# Patient Record
Sex: Female | Born: 1950 | Race: White | Hispanic: No | Marital: Married | Smoking: Never smoker
Health system: Southern US, Community
[De-identification: ages and names within clinical notes are randomized; demographics above are authoritative.]

## PROBLEM LIST (undated history)

## (undated) DIAGNOSIS — I10 Essential (primary) hypertension: Secondary | ICD-10-CM

## (undated) DIAGNOSIS — Z87442 Personal history of urinary calculi: Secondary | ICD-10-CM

## (undated) HISTORY — PX: CHOLECYSTECTOMY: SHX55

## (undated) HISTORY — PX: TUBAL LIGATION: SHX77

---

## 1997-08-31 ENCOUNTER — Ambulatory Visit (HOSPITAL_COMMUNITY): Admission: RE | Admit: 1997-08-31 | Discharge: 1997-08-31 | Payer: Self-pay | Admitting: Obstetrics & Gynecology

## 1998-03-12 ENCOUNTER — Other Ambulatory Visit: Admission: RE | Admit: 1998-03-12 | Discharge: 1998-03-12 | Payer: Self-pay | Admitting: Obstetrics & Gynecology

## 1998-09-03 ENCOUNTER — Encounter: Payer: Self-pay | Admitting: Obstetrics & Gynecology

## 1998-09-03 ENCOUNTER — Ambulatory Visit (HOSPITAL_COMMUNITY): Admission: RE | Admit: 1998-09-03 | Discharge: 1998-09-03 | Payer: Self-pay | Admitting: Obstetrics & Gynecology

## 1999-04-05 ENCOUNTER — Other Ambulatory Visit: Admission: RE | Admit: 1999-04-05 | Discharge: 1999-04-05 | Payer: Self-pay | Admitting: Obstetrics & Gynecology

## 1999-08-04 ENCOUNTER — Ambulatory Visit (HOSPITAL_COMMUNITY): Admission: RE | Admit: 1999-08-04 | Discharge: 1999-08-04 | Payer: Self-pay | Admitting: Obstetrics & Gynecology

## 1999-08-04 ENCOUNTER — Encounter: Payer: Self-pay | Admitting: Obstetrics & Gynecology

## 2000-07-27 ENCOUNTER — Ambulatory Visit (HOSPITAL_COMMUNITY): Admission: RE | Admit: 2000-07-27 | Discharge: 2000-07-27 | Payer: Self-pay | Admitting: Gastroenterology

## 2000-08-03 ENCOUNTER — Ambulatory Visit (HOSPITAL_COMMUNITY): Admission: RE | Admit: 2000-08-03 | Discharge: 2000-08-03 | Payer: Self-pay | Admitting: Obstetrics and Gynecology

## 2000-08-03 ENCOUNTER — Encounter: Payer: Self-pay | Admitting: Obstetrics and Gynecology

## 2001-08-13 ENCOUNTER — Ambulatory Visit (HOSPITAL_COMMUNITY): Admission: RE | Admit: 2001-08-13 | Discharge: 2001-08-13 | Payer: Self-pay | Admitting: Obstetrics and Gynecology

## 2001-08-13 ENCOUNTER — Encounter: Payer: Self-pay | Admitting: Obstetrics and Gynecology

## 2002-08-18 ENCOUNTER — Encounter: Payer: Self-pay | Admitting: Obstetrics and Gynecology

## 2002-08-18 ENCOUNTER — Ambulatory Visit (HOSPITAL_COMMUNITY): Admission: RE | Admit: 2002-08-18 | Discharge: 2002-08-18 | Payer: Self-pay | Admitting: Obstetrics and Gynecology

## 2003-06-01 ENCOUNTER — Emergency Department (HOSPITAL_COMMUNITY): Admission: EM | Admit: 2003-06-01 | Discharge: 2003-06-01 | Payer: Self-pay | Admitting: Emergency Medicine

## 2003-06-02 ENCOUNTER — Ambulatory Visit (HOSPITAL_COMMUNITY): Admission: RE | Admit: 2003-06-02 | Discharge: 2003-06-02 | Payer: Self-pay | Admitting: Emergency Medicine

## 2003-06-03 ENCOUNTER — Ambulatory Visit (HOSPITAL_COMMUNITY): Admission: RE | Admit: 2003-06-03 | Discharge: 2003-06-03 | Payer: Self-pay | Admitting: General Surgery

## 2003-08-19 ENCOUNTER — Ambulatory Visit (HOSPITAL_COMMUNITY): Admission: RE | Admit: 2003-08-19 | Discharge: 2003-08-19 | Payer: Self-pay | Admitting: Obstetrics and Gynecology

## 2004-04-06 ENCOUNTER — Other Ambulatory Visit: Admission: RE | Admit: 2004-04-06 | Discharge: 2004-04-06 | Payer: Self-pay | Admitting: Obstetrics and Gynecology

## 2004-08-22 ENCOUNTER — Ambulatory Visit (HOSPITAL_COMMUNITY): Admission: RE | Admit: 2004-08-22 | Discharge: 2004-08-22 | Payer: Self-pay | Admitting: Obstetrics and Gynecology

## 2005-07-25 ENCOUNTER — Other Ambulatory Visit: Admission: RE | Admit: 2005-07-25 | Discharge: 2005-07-25 | Payer: Self-pay | Admitting: Obstetrics and Gynecology

## 2005-08-24 ENCOUNTER — Ambulatory Visit (HOSPITAL_COMMUNITY): Admission: RE | Admit: 2005-08-24 | Discharge: 2005-08-24 | Payer: Self-pay | Admitting: Obstetrics and Gynecology

## 2006-08-06 ENCOUNTER — Other Ambulatory Visit: Admission: RE | Admit: 2006-08-06 | Discharge: 2006-08-06 | Payer: Self-pay | Admitting: Obstetrics and Gynecology

## 2006-08-27 ENCOUNTER — Ambulatory Visit (HOSPITAL_COMMUNITY): Admission: RE | Admit: 2006-08-27 | Discharge: 2006-08-27 | Payer: Self-pay | Admitting: Obstetrics and Gynecology

## 2007-08-07 ENCOUNTER — Other Ambulatory Visit: Admission: RE | Admit: 2007-08-07 | Discharge: 2007-08-07 | Payer: Self-pay | Admitting: Obstetrics and Gynecology

## 2007-08-28 ENCOUNTER — Ambulatory Visit (HOSPITAL_COMMUNITY): Admission: RE | Admit: 2007-08-28 | Discharge: 2007-08-28 | Payer: Self-pay | Admitting: Obstetrics and Gynecology

## 2008-08-17 ENCOUNTER — Other Ambulatory Visit: Admission: RE | Admit: 2008-08-17 | Discharge: 2008-08-17 | Payer: Self-pay | Admitting: Obstetrics and Gynecology

## 2008-08-28 ENCOUNTER — Ambulatory Visit (HOSPITAL_COMMUNITY): Admission: RE | Admit: 2008-08-28 | Discharge: 2008-08-28 | Payer: Self-pay | Admitting: Obstetrics and Gynecology

## 2008-09-02 ENCOUNTER — Encounter: Admission: RE | Admit: 2008-09-02 | Discharge: 2008-09-02 | Payer: Self-pay | Admitting: Obstetrics and Gynecology

## 2009-08-30 ENCOUNTER — Encounter: Admission: RE | Admit: 2009-08-30 | Discharge: 2009-08-30 | Payer: Self-pay | Admitting: Obstetrics and Gynecology

## 2009-09-02 ENCOUNTER — Other Ambulatory Visit: Admission: RE | Admit: 2009-09-02 | Discharge: 2009-09-02 | Payer: Self-pay | Admitting: Obstetrics and Gynecology

## 2010-06-24 NOTE — H&P (Signed)
NAME:  Ebony Williams, Ebony Williams                         ACCOUNT NO.:  [id number]   MEDICAL RECORD NO.:  [id number]                   PATIENT TYPE:  OUT   LOCATION:                                       FACILITY:  [facility]   PHYSICIAN:  Dalia Heading, M.D.               DATE OF BIRTH:  [birth year]   DATE OF ADMISSION:  06/03/2003  DATE OF DISCHARGE:                                HISTORY & PHYSICAL   CHIEF COMPLAINT:  Cholecystitis, cholelithiasis.   HISTORY OF PRESENT ILLNESS:  The patient is a 60 year old white female who  is referred for evaluation and treatment of cholecystitis secondary to  cholelithiasis.  She has been having intermittent episode of right upper  quadrant abdominal pain with radiation to the right flank, nausea, and  bloating for several days.  Some fatty food intolerance is noted.  No fever,  chills, or jaundice have been noted.   PAST MEDICAL HISTORY:  Past medical history includes:  1. Hypertension.  2. Kidney stones.   PAST SURGICAL HISTORY:  Noncontributory, tubal ligation.   CURRENT MEDICATIONS:  Effexor, Diovan.   ALLERGIES:  SULFA.   REVIEW OF SYSTEMS:  The patient denies drinking or smoking.  She denies any  cardiopulmonary difficulties or bleeding disorders.   PHYSICAL EXAMINATION:  GENERAL:  On physical examination, the patient is a  well-developed, well-nourished white female in no acute distress.  VITAL SIGNS:  She is afebrile and vital signs are stable.  HEENT:  Examination reveals no scleral icterus.  LUNGS:  Lungs are clear to auscultation with equal breath sounds  bilaterally.  HEART:  Examination reveals a regular rate and rhythm without S3, S4, or  murmurs.  ABDOMEN:  The abdomen is soft with slight tenderness noted in the right  upper quadrant to palpation.  No hepatosplenomegaly, masses, or herniae are  identified.   IMAGING STUDY:  Ultrasound of the gallbladder revealed cholelithiasis with a  normal common bile duct.   IMPRESSION:  1.  Cholecystitis.  2. Cholelithiasis.   PLAN:  The patient is scheduled for a laparoscopic cholecystectomy on June 03, 2003.  The risks and benefits of the procedure including bleeding,  infection, hepatobiliary injury, and the possibility of an open procedure  were fully explained to the patient, who gave informed consent.     ___________________________________________                                         Dalia Heading, M.D.   MAJ/MEDQ  D:  06/02/2003  T:  06/02/2003  Job:  [id number]   cc:   [facility]

## 2010-06-24 NOTE — Op Note (Signed)
NAME:  Ebony Williams, Ebony Williams                         ACCOUNT NO.:  [id number]   MEDICAL RECORD NO.:  [id number]                   PATIENT TYPE:  AMB   LOCATION:  DAY                                  FACILITY:  [facility]   PHYSICIAN:  Dalia Heading, M.D.               DATE OF BIRTH:  [birth year]   DATE OF PROCEDURE:  06/03/2003  DATE OF DISCHARGE:                                 OPERATIVE REPORT   PREOPERATIVE DIAGNOSIS:  Cholecystitis, cholelithiasis.   POSTOPERATIVE DIAGNOSIS:  Cholecystitis, cholelithiasis.   OPERATION/PROCEDURE:  Laparoscopic cholecystectomy.   SURGEON:  Dalia Heading, M.D.   ASSISTANT:  Bernerd Limbo. Leona Carry, M.D.   ANESTHESIA:  General endotracheal anesthesia.   INDICATIONS:  The patient is a 60 year old white female who presents with  cholecystitis secondary to cholelithiasis.  This was confirmed by ultrasound  of the gallbladder.  The risks and benefits of the procedure including  bleeding, infection, hepatobiliary injury, and the possibility of an open  procedure were fully explained to the patient, who gave informed consent.   DESCRIPTION OF PROCEDURE:  The patient is placed in the supine position.  After induction of general endotracheal anesthesia, the abdomen was prepped  and draped using the usual sterile technique with Betadine.  Surgical site  confirmation was performed.   A supraumbilical incision was made down to the fascia.  Veress needle was  introduced into the abdominal cavity and confirmation of placement was done  using the saline drop test.  The abdomen was then insufflated with 16 mmHg  pressure.  An 11 mm trocar was then introduced into the abdominal cavity  under direct visualization without difficulty.  The patient was placed in  reverse Trendelenburg position and additional 11 mm trocar was placed in the  epigastric region and 5 mm trocars were placed in the right upper quadrant  and right flank regions.  Liver was inspected and noted to be  within normal  limits.  The gallbladder was retracted superiorly and laterally.  The  dissection was begun around the infundibulum of the gallbladder.  The cystic  duct was first identified.  Its junction to the infundibulum was fully  identified.  Endoclips were placed proximally and distally on the cystic  duct and the cystic duct was divided.  This was likewise done with the  cystic artery.  The gallbladder was then freed away from the gallbladder  fossa using Bovie electrocautery.  The gallbladder was delivered through the  epigastric trocar site using an endocatch bag.  The gallbladder fossa was  inspected and no abnormal bleeding or bile leakage was noted.  Surgicel was  placed in the gallbladder fossa.  All fluid was then evacuated from the  abdominal cavity prior to removal of the trocars.  All wounds were irrigated  with normal saline.  All wounds were injected with 0.5% Sensorcaine.  The  supraumbilical fascia was reapproximated using an 0 Vicryl  interrupted  suture.  All skin incisions were closed using staples.  Betadine ointment  and dry sterile dressings were applied.   All tape and needle counts correct at the end of the procedure.  The patient  was extubated in the operating room and went back to the recovery room  awake, in stable condition.  Complications - none.  Specimen - gallbladder  with stones.  Blood loss minimal.      ___________________________________________                                            Dalia Heading, M.D.   MAJ/MEDQ  D:  06/03/2003  T:  06/03/2003  Job:  [id number]   cc:   [facility]

## 2010-07-25 ENCOUNTER — Other Ambulatory Visit: Payer: Self-pay | Admitting: Obstetrics and Gynecology

## 2010-07-25 DIAGNOSIS — Z1231 Encounter for screening mammogram for malignant neoplasm of breast: Secondary | ICD-10-CM

## 2010-08-24 ENCOUNTER — Other Ambulatory Visit: Payer: Self-pay | Admitting: Gastroenterology

## 2010-08-24 DIAGNOSIS — Z86010 Personal history of colonic polyps: Secondary | ICD-10-CM

## 2010-08-26 ENCOUNTER — Ambulatory Visit
Admission: RE | Admit: 2010-08-26 | Discharge: 2010-08-26 | Disposition: A | Discharge status: Home / Self Care | Payer: BC Managed Care – PPO | Source: Ambulatory Visit | Attending: Gastroenterology | Admitting: Gastroenterology

## 2010-08-26 DIAGNOSIS — Z86010 Personal history of colonic polyps: Secondary | ICD-10-CM

## 2010-09-01 ENCOUNTER — Ambulatory Visit
Admission: RE | Admit: 2010-09-01 | Discharge: 2010-09-01 | Disposition: A | Discharge status: Home / Self Care | Payer: BC Managed Care – PPO | Source: Ambulatory Visit | Attending: Obstetrics and Gynecology | Admitting: Obstetrics and Gynecology

## 2010-09-01 DIAGNOSIS — Z1231 Encounter for screening mammogram for malignant neoplasm of breast: Secondary | ICD-10-CM

## 2010-09-06 ENCOUNTER — Other Ambulatory Visit: Payer: Self-pay | Admitting: Nurse Practitioner

## 2010-09-06 ENCOUNTER — Other Ambulatory Visit (HOSPITAL_COMMUNITY)
Admission: RE | Admit: 2010-09-06 | Discharge: 2010-09-06 | Disposition: A | Discharge status: Home / Self Care | Payer: BC Managed Care – PPO | Source: Ambulatory Visit | Attending: Obstetrics and Gynecology | Admitting: Obstetrics and Gynecology

## 2010-09-06 DIAGNOSIS — Z01419 Encounter for gynecological examination (general) (routine) without abnormal findings: Secondary | ICD-10-CM | POA: Insufficient documentation

## 2011-07-24 ENCOUNTER — Other Ambulatory Visit: Payer: Self-pay | Admitting: Obstetrics and Gynecology

## 2011-07-24 DIAGNOSIS — Z1231 Encounter for screening mammogram for malignant neoplasm of breast: Secondary | ICD-10-CM

## 2011-09-11 ENCOUNTER — Ambulatory Visit
Admission: RE | Admit: 2011-09-11 | Discharge: 2011-09-11 | Disposition: A | Discharge status: Home / Self Care | Payer: BC Managed Care – PPO | Source: Ambulatory Visit | Attending: Obstetrics and Gynecology | Admitting: Obstetrics and Gynecology

## 2011-09-11 DIAGNOSIS — Z1231 Encounter for screening mammogram for malignant neoplasm of breast: Secondary | ICD-10-CM

## 2011-09-13 ENCOUNTER — Other Ambulatory Visit: Payer: Self-pay | Admitting: Obstetrics and Gynecology

## 2011-10-18 ENCOUNTER — Ambulatory Visit (HOSPITAL_COMMUNITY)
Admission: RE | Admit: 2011-10-18 | Discharge: 2011-10-18 | Disposition: A | Discharge status: Home / Self Care | Payer: BC Managed Care – PPO | Source: Ambulatory Visit | Attending: Orthopedic Surgery | Admitting: Orthopedic Surgery

## 2011-10-18 DIAGNOSIS — IMO0001 Reserved for inherently not codable concepts without codable children: Secondary | ICD-10-CM | POA: Insufficient documentation

## 2011-10-18 DIAGNOSIS — M25619 Stiffness of unspecified shoulder, not elsewhere classified: Secondary | ICD-10-CM | POA: Insufficient documentation

## 2011-10-18 DIAGNOSIS — M6281 Muscle weakness (generalized): Secondary | ICD-10-CM | POA: Insufficient documentation

## 2011-10-18 DIAGNOSIS — M25519 Pain in unspecified shoulder: Secondary | ICD-10-CM | POA: Insufficient documentation

## 2011-10-18 DIAGNOSIS — M7500 Adhesive capsulitis of unspecified shoulder: Secondary | ICD-10-CM | POA: Insufficient documentation

## 2011-10-18 NOTE — Evaluation (Addendum)
Occupational Therapy Evaluation  Patient Details  Name: Ebony Williams MRN: [id number] Date of Birth: February 23, 1950  Today's Date: 10/18/2011 Time: 9147-8295 OT Time Calculation (min): 40 min OT Evaluation 20' Manual Therapy 20' Visit#: 1  of 1   Re-eval:    Assessment Diagnosis: Right Frozen Shoulder  Authorization: no authorization required   Past Medical History: No past medical history on file. Past Surgical History: No past surgical history on file.  Subjective S:When I reach into the back of the dryer it's like a knife stabbing me. Pertinent History: [name] reports begininning to experience pain in her right shoulder this summer after using a leaf blower.  She consulted with Dr. Ranell Patrick and was diagnosed with a frozen shoulder.  She recieved a cortisone injection 2 weeks ago that has alleviated her pain.  She has been referred to occupational therapy for evaluation and education on a HEP. Limitations: doffing bra, reaching into the back of the dryer, reaching overhead. Special Tests: UEFI:  78/80= 98% Patient Stated Goals: To be able to hold her new grand baby when it's born. Pain Assessment Currently in Pain?: No/denies Pain Radiating Towards: pain with attempting to fasten her bra or reach into dryer is "excruciating"  Precautions/Restrictions  Precautions Precautions: None  Prior Functioning  Home Living Lives With: Spouse;Other (Comment) (36 y/o grandson) Prior Function Vocation: Full time employment Vocation Requirements:  Doctor, general practice)  Assessment ADL/Vision/Perception ADL ADL Comments: Patient has difficulty fastening her bra and reaching into the back of the dryer Dominant Hand: Right Vision - History Baseline Vision: Wears glasses all the time  Cognition/Observation Cognition Orientation Level: Oriented X4   Additional Assessments RUE AROM (degrees) RUE Overall AROM Comments: ER/IR with shoulder abducted Right Shoulder Flexion: 155  Degrees (seated) Right Shoulder ABduction: 115 Degrees (standing) Right Shoulder Internal Rotation: 68 Degrees (standing) Right Shoulder External Rotation: 55 Degrees (standing) RUE PROM (degrees) RUE Overall PROM Comments: WFL RUE Strength RUE Overall Strength Comments: 5/5 Palpation Palpation: Max fascial restrictions in her scapular, trapezius, and upper arm      Exercise/Treatments    Manual Therapy Manual Therapy: Myofascial release Myofascial Release: MFR to right upper arm, scapular, trapezius region to decrease pain and restrictions and increase AROM and strength.  Occupational Therapy Assessment and Plan OT Assessment and Plan Clinical Impression Statement: A:  61 year old female with pain and restrictions  causing decreased AROM in right shoulder due to frozen shoulder.  Referred for HEP. Pt will benefit from skilled therapeutic intervention in order to improve on the following deficits: Decreased range of motion;Increased muscle spasms;Increased fascial restricitons;Pain Rehab Potential: Excellent OT Frequency: Min 1X/week OT Duration: Other (comment) (1 weeks) OT Treatment/Interventions: Manual therapy;Patient/family education OT Plan: P:  Educated on HEP today and will continue her therapy as a HEP.     Goals Short Term Goals Time to Complete Short Term Goals: 2 weeks Short Term Goal 1: Patient will be educated on HEP for shoulder stretches and proximal shoulder strengthening.  Short Term Goal 1 Progress: Met  Problem List Patient Active Problem List  Diagnosis  . Frozen shoulder  . Pain in joint, shoulder region    End of Session Activity Tolerance: Patient tolerated treatment well General Behavior During Session: [facility] for tasks performed Cognition: [facility] for tasks performed OT Plan of Care OT Home Exercise Plan: Educated on HEP for shoulder stretches, tband, and cervical stretches.   Consulted and Agree with Plan of Care: Patient  GO    Hyun Reali H.  Dayton Scrape, OTR/L  10/18/2011, 4:11 PM  Physician Documentation Your signature is required to indicate approval of the treatment plan as stated above.  Please sign and either send electronically or make a copy of this report for your files and return this physician signed original.  Please mark one 1.__approve of plan  2. ___approve of plan with the following conditions.   ______________________________                                                          _____________________ Physician Signature                                                                                                             Date

## 2012-08-07 ENCOUNTER — Other Ambulatory Visit: Payer: Self-pay

## 2012-08-07 DIAGNOSIS — Z1231 Encounter for screening mammogram for malignant neoplasm of breast: Secondary | ICD-10-CM

## 2012-09-11 ENCOUNTER — Ambulatory Visit
Admission: RE | Admit: 2012-09-11 | Discharge: 2012-09-11 | Disposition: A | Discharge status: Home / Self Care | Payer: BC Managed Care – PPO | Source: Ambulatory Visit

## 2012-09-11 DIAGNOSIS — Z1231 Encounter for screening mammogram for malignant neoplasm of breast: Secondary | ICD-10-CM

## 2012-09-16 ENCOUNTER — Other Ambulatory Visit: Payer: Self-pay | Admitting: Obstetrics and Gynecology

## 2013-08-04 ENCOUNTER — Other Ambulatory Visit: Payer: Self-pay

## 2013-08-04 DIAGNOSIS — Z1231 Encounter for screening mammogram for malignant neoplasm of breast: Secondary | ICD-10-CM

## 2013-09-12 ENCOUNTER — Ambulatory Visit
Admission: RE | Admit: 2013-09-12 | Discharge: 2013-09-12 | Disposition: A | Discharge status: Home / Self Care | Payer: Medicare HMO | Source: Ambulatory Visit

## 2013-09-12 ENCOUNTER — Encounter (INDEPENDENT_AMBULATORY_CARE_PROVIDER_SITE_OTHER): Payer: Self-pay

## 2013-09-12 DIAGNOSIS — Z1231 Encounter for screening mammogram for malignant neoplasm of breast: Secondary | ICD-10-CM

## 2013-09-25 ENCOUNTER — Other Ambulatory Visit: Payer: Self-pay | Admitting: Obstetrics and Gynecology

## 2013-09-26 LAB — CYTOLOGY - PAP

## 2014-05-19 ENCOUNTER — Other Ambulatory Visit (HOSPITAL_COMMUNITY): Payer: Self-pay | Admitting: Family Medicine

## 2014-05-19 DIAGNOSIS — M8580 Other specified disorders of bone density and structure, unspecified site: Secondary | ICD-10-CM

## 2014-05-26 ENCOUNTER — Ambulatory Visit (HOSPITAL_COMMUNITY)
Admission: RE | Admit: 2014-05-26 | Discharge: 2014-05-26 | Disposition: A | Discharge status: Home / Self Care | Payer: Medicare PPO | Source: Ambulatory Visit | Attending: Family Medicine | Admitting: Family Medicine

## 2014-05-26 DIAGNOSIS — M8580 Other specified disorders of bone density and structure, unspecified site: Secondary | ICD-10-CM

## 2014-05-26 DIAGNOSIS — M859 Disorder of bone density and structure, unspecified: Secondary | ICD-10-CM | POA: Diagnosis not present

## 2014-08-06 ENCOUNTER — Other Ambulatory Visit: Payer: Self-pay

## 2014-08-06 DIAGNOSIS — Z1231 Encounter for screening mammogram for malignant neoplasm of breast: Secondary | ICD-10-CM

## 2014-09-18 ENCOUNTER — Ambulatory Visit
Admission: RE | Admit: 2014-09-18 | Discharge: 2014-09-18 | Disposition: A | Discharge status: Home / Self Care | Payer: Medicare PPO | Source: Ambulatory Visit

## 2014-09-18 DIAGNOSIS — Z1231 Encounter for screening mammogram for malignant neoplasm of breast: Secondary | ICD-10-CM

## 2014-09-21 ENCOUNTER — Ambulatory Visit: Payer: Medicare PPO

## 2014-09-23 ENCOUNTER — Other Ambulatory Visit: Payer: Self-pay | Admitting: Obstetrics and Gynecology

## 2014-09-23 DIAGNOSIS — R928 Other abnormal and inconclusive findings on diagnostic imaging of breast: Secondary | ICD-10-CM

## 2014-09-25 ENCOUNTER — Ambulatory Visit
Admission: RE | Admit: 2014-09-25 | Discharge: 2014-09-25 | Disposition: A | Discharge status: Home / Self Care | Payer: Medicare PPO | Source: Ambulatory Visit | Attending: Obstetrics and Gynecology | Admitting: Obstetrics and Gynecology

## 2014-09-25 DIAGNOSIS — R928 Other abnormal and inconclusive findings on diagnostic imaging of breast: Secondary | ICD-10-CM

## 2015-01-04 ENCOUNTER — Encounter (HOSPITAL_COMMUNITY): Payer: Self-pay | Admitting: *Deleted

## 2015-01-04 ENCOUNTER — Other Ambulatory Visit: Payer: Self-pay | Admitting: Gastroenterology

## 2015-01-07 NOTE — Anesthesia Preprocedure Evaluation (Addendum)
Anesthesia Evaluation  Patient identified by MRN, date of birth, ID band Patient awake    Reviewed: Allergy & Precautions, NPO status , Patient's Chart, lab work & pertinent test results  Airway Mallampati: II   Neck ROM: Full    Dental  (+) Dental Advisory Given, Teeth Intact   Pulmonary neg pulmonary ROS,    breath sounds clear to auscultation       Cardiovascular hypertension, negative cardio ROS   Rhythm:Regular     Neuro/Psych Anxiety negative neurological ROS  negative psych ROS   GI/Hepatic Neg liver ROS, Abnormal stool test   Endo/Other  negative endocrine ROS  Renal/GU negative Renal ROS  negative genitourinary   Musculoskeletal negative musculoskeletal ROS (+)   Abdominal (+)  Abdomen: soft.    Peds negative pediatric ROS (+)  Hematology negative hematology ROS (+)   Anesthesia Other Findings   Reproductive/Obstetrics negative OB ROS                            Anesthesia Physical Anesthesia Plan  ASA: II  Anesthesia Plan: MAC   Post-op Pain Management:    Induction:   Airway Management Planned: Nasal Cannula  Additional Equipment:   Intra-op Plan:   Post-operative Plan:   Informed Consent: I have reviewed the patients History and Physical, chart, labs and discussed the procedure including the risks, benefits and alternatives for the proposed anesthesia with the patient or authorized representative who has indicated his/her understanding and acceptance.     Plan Discussed with:   Anesthesia Plan Comments:         Anesthesia Quick Evaluation

## 2015-01-12 ENCOUNTER — Encounter (HOSPITAL_COMMUNITY): Payer: Self-pay | Admitting: *Deleted

## 2015-01-12 ENCOUNTER — Ambulatory Visit (HOSPITAL_COMMUNITY)
Admission: RE | Admit: 2015-01-12 | Discharge: 2015-01-12 | Disposition: A | Discharge status: Home / Self Care | Payer: Medicare PPO | Source: Ambulatory Visit | Attending: Gastroenterology | Admitting: Gastroenterology

## 2015-01-12 ENCOUNTER — Encounter (HOSPITAL_COMMUNITY)
Admission: RE | Disposition: A | Discharge status: Home / Self Care | Payer: Self-pay | Source: Ambulatory Visit | Attending: Gastroenterology

## 2015-01-12 ENCOUNTER — Ambulatory Visit (HOSPITAL_COMMUNITY): Payer: Medicare PPO | Admitting: Anesthesiology

## 2015-01-12 DIAGNOSIS — Z1211 Encounter for screening for malignant neoplasm of colon: Secondary | ICD-10-CM | POA: Diagnosis present

## 2015-01-12 DIAGNOSIS — Z86010 Personal history of colonic polyps: Secondary | ICD-10-CM | POA: Diagnosis not present

## 2015-01-12 DIAGNOSIS — D124 Benign neoplasm of descending colon: Secondary | ICD-10-CM | POA: Diagnosis not present

## 2015-01-12 DIAGNOSIS — K5730 Diverticulosis of large intestine without perforation or abscess without bleeding: Secondary | ICD-10-CM | POA: Insufficient documentation

## 2015-01-12 HISTORY — DX: Personal history of urinary calculi: Z87.442

## 2015-01-12 HISTORY — DX: Essential (primary) hypertension: I10

## 2015-01-12 HISTORY — PX: COLONOSCOPY WITH PROPOFOL: SHX5780

## 2015-01-12 SURGERY — COLONOSCOPY WITH PROPOFOL
Anesthesia: Monitor Anesthesia Care

## 2015-01-12 MED ORDER — PROMETHAZINE HCL 25 MG/ML IJ SOLN: 6.2500 mg | INTRAMUSCULAR | Status: DC | PRN

## 2015-01-12 MED ORDER — PROPOFOL 500 MG/50ML IV EMUL
INTRAVENOUS | Status: DC | PRN
  Administered 2015-01-12 (×2): 30 mg via INTRAVENOUS

## 2015-01-12 MED ORDER — SODIUM CHLORIDE 0.9 % IV SOLN: INTRAVENOUS | Status: DC

## 2015-01-12 MED ORDER — LACTATED RINGERS IV SOLN
INTRAVENOUS | Status: DC
  Administered 2015-01-12: 1000 mL via INTRAVENOUS

## 2015-01-12 MED ORDER — MEPERIDINE HCL 100 MG/ML IJ SOLN: 6.2500 mg | INTRAMUSCULAR | Status: DC | PRN

## 2015-01-12 MED ORDER — FENTANYL CITRATE (PF) 100 MCG/2ML IJ SOLN: 25.0000 ug | INTRAMUSCULAR | Status: DC | PRN

## 2015-01-12 MED ORDER — PROPOFOL 10 MG/ML IV BOLUS
INTRAVENOUS | Status: AC
  Filled 2015-01-12: qty 80

## 2015-01-12 MED ORDER — PROPOFOL 500 MG/50ML IV EMUL
INTRAVENOUS | Status: DC | PRN
  Administered 2015-01-12: 100 ug/kg/min via INTRAVENOUS

## 2015-01-12 SURGICAL SUPPLY — 22 items

## 2015-01-12 NOTE — Op Note (Signed)
Procedure: Surveillance colonoscopy. Normal screening colonoscopy performed on 07/28/1999. Colonoscopy performed with removal of a 5 mm adenomatous transverse colon polyp on 08/20/2007. July 2012 normal flexible proctosigmoidoscopy followed by air contrast barium enema performed.  Endoscopist: Earle Gell  Premedication: Propofol administered by anesthesia  Procedure: The patient was placed in the left lateral decubitus position. Anal inspection and digital rectal exam were normal. The Pentax pediatric colonoscope was introduced into the rectum and advanced to the cecum. A normal-appearing appendiceal orifice and ileocecal valve were identified. Colonic preparation for the exam today was good. Withdrawal time was 14 minutes  Rectum. Normal. Retroflexed view of the distal rectum was normal  Sigmoid colon. Colonic diverticulosis  Descending colon. From the proximal descending colon, a 3 mm sessile polyp was removed with the cold biopsy forceps  Splenic flexure. Normal  Transverse colon. Normal  Hepatic flexure. Normal  Ascending colon. Normal  Cecum and ileocecal valve. Normal  Assessment: A diminutive polyp was removed from the descending colon. Otherwise normal colonoscopy.  Recommendation: If the colon polyp returns adenomatous pathologically, schedule repeat colonoscopy in 5 years.

## 2015-01-12 NOTE — Transfer of Care (Signed)
Immediate Anesthesia Transfer of Care Note  Patient: Ebony Williams  Procedure(s) Performed: Procedure(s): COLONOSCOPY WITH PROPOFOL (N/A)  Patient Location: PACU  Anesthesia Type:MAC  Level of Consciousness: sedated, patient cooperative and responds to stimulation  Airway & Oxygen Therapy: Patient Spontanous Breathing and Patient connected to face mask oxygen  Post-op Assessment: Report given to RN and Post -op Vital signs reviewed and stable  Post vital signs: Reviewed and stable  Last Vitals:  Filed Vitals:   01/12/15 0641  BP: 182/89  Pulse: 77  Temp: 36.4 C  Resp: 23    Complications: No apparent anesthesia complications

## 2015-01-12 NOTE — Discharge Instructions (Signed)

## 2015-01-12 NOTE — Anesthesia Postprocedure Evaluation (Signed)
Anesthesia Post Note  Patient: Ebony Williams  Procedure(s) Performed: Procedure(s) (LRB): COLONOSCOPY WITH PROPOFOL (N/A)  Patient location during evaluation: Endoscopy Anesthesia Type: MAC Level of consciousness: awake and alert Pain management: pain level controlled Vital Signs Assessment: post-procedure vital signs reviewed and stable Respiratory status: spontaneous breathing, nonlabored ventilation, respiratory function stable and patient connected to nasal cannula oxygen Cardiovascular status: blood pressure returned to baseline and stable Postop Assessment: no signs of nausea or vomiting Anesthetic complications: no    Last Vitals:  Filed Vitals:   01/12/15 0641 01/12/15 0757  BP: 182/89 169/77  Pulse: 77 85  Temp: 36.4 C   Resp: 23 16    Last Pain: There were no vitals filed for this visit.               Seanna Sisler

## 2015-01-12 NOTE — H&P (Signed)
  Procedure: Surveillance  colonoscopy. 07/28/1999 normal screening colonoscopy performed. 08/20/2007 colonoscopy performed with removal of a 5 mm adenomatous transverse colon polyp. July 2012 normal surveillance flexible proctosigmoidoscopy followed by air contrast barium enema.  History: The patient is a 64 year old female born 12-06-1950. As part of her routine gynecologic physical exam, the patient submitted stool cards for heme testing. One of 3 cards was positive for blood.  The patient is scheduled to undergo a surveillance colonoscopy today.  Past medical history: Hypertension. History of adenomatous colon polyp removed colonoscopically in the past.  Medication allergies: Sulfa drugs  Exam: The patient is alert and lying comfortably on the endoscopy stretcher. Abdomen is soft and nontender to palpation. Lungs are clear to auscultation. Cardiac exam reveals a regular rhythm.  Plan: Proceed with surveillance colonoscopy

## 2015-01-13 ENCOUNTER — Encounter (HOSPITAL_COMMUNITY): Payer: Self-pay | Admitting: Gastroenterology

## 2015-09-29 ENCOUNTER — Other Ambulatory Visit: Payer: Self-pay | Admitting: Obstetrics and Gynecology

## 2015-09-29 DIAGNOSIS — Z1231 Encounter for screening mammogram for malignant neoplasm of breast: Secondary | ICD-10-CM

## 2015-10-06 ENCOUNTER — Ambulatory Visit
Admission: RE | Admit: 2015-10-06 | Discharge: 2015-10-06 | Disposition: A | Discharge status: Home / Self Care | Payer: Medicare Other | Source: Ambulatory Visit | Attending: Obstetrics and Gynecology | Admitting: Obstetrics and Gynecology

## 2015-10-06 DIAGNOSIS — Z1231 Encounter for screening mammogram for malignant neoplasm of breast: Secondary | ICD-10-CM

## 2016-08-31 ENCOUNTER — Other Ambulatory Visit: Payer: Self-pay | Admitting: Obstetrics and Gynecology

## 2016-08-31 DIAGNOSIS — Z1231 Encounter for screening mammogram for malignant neoplasm of breast: Secondary | ICD-10-CM

## 2016-10-10 ENCOUNTER — Ambulatory Visit
Admission: RE | Admit: 2016-10-10 | Discharge: 2016-10-10 | Disposition: A | Discharge status: Home / Self Care | Payer: Medicare Other | Source: Ambulatory Visit | Attending: Obstetrics and Gynecology | Admitting: Obstetrics and Gynecology

## 2016-10-10 DIAGNOSIS — Z1231 Encounter for screening mammogram for malignant neoplasm of breast: Secondary | ICD-10-CM

## 2016-12-01 ENCOUNTER — Ambulatory Visit (INDEPENDENT_AMBULATORY_CARE_PROVIDER_SITE_OTHER): Payer: Medicare Other | Admitting: Cardiovascular Disease

## 2016-12-01 ENCOUNTER — Encounter: Payer: Self-pay | Admitting: Cardiovascular Disease

## 2016-12-01 VITALS — BP 132/74 | HR 79 | Ht 62.0 in | Wt 178.0 lb

## 2016-12-01 DIAGNOSIS — I10 Essential (primary) hypertension: Secondary | ICD-10-CM | POA: Diagnosis not present

## 2016-12-01 DIAGNOSIS — R0609 Other forms of dyspnea: Secondary | ICD-10-CM | POA: Diagnosis not present

## 2016-12-01 DIAGNOSIS — R079 Chest pain, unspecified: Secondary | ICD-10-CM | POA: Diagnosis not present

## 2016-12-01 NOTE — Progress Notes (Signed)
CARDIOLOGY CONSULT NOTE  Patient ID: Ebony Williams MRN: [id number] DOB/AGE: 1950/03/17 66 y.o.  Admit date: (Not on file) Primary Physician: Sharilyn Sites, MD Referring Physician: Sharilyn Sites, MD  Reason for Consultation: Chest pain  HPI: Ebony Williams is a 66 y.o. female who is being seen today for the evaluation of chest pain at the request of Sharilyn Sites, MD.   I reviewed all relevant documentation, labs, studies from PCP.  She has a history of hypertension.  Labs 10/31/16: White blood cells 8.5, hemoglobin 14.2, platelets 268, BUN 12, creatinine 0.83, sodium 143, potassium 5.1, AST 57, ALT 48, hemoglobin A1c 6.5%, total cholesterol 181, triglycerides 154, HDL 37, LDL 113, TSH 1.2, vitamin D very low at 12.  She tells me her primary complaint relates to exertional dyspnea. She has steps at home and has been more short of breath climbing them over the past year. Symptoms have not progressed over the past year. She does feel more fatigued than she did 1 year ago. She has occasional retrosternal chest heaviness with right arm pain. Symptoms last minutes. There is no associated nausea, vomiting, lightheadedness, dizziness, syncope, or palpitations.  She denies a history of smoking, asthma, allergies, cough, and fevers.  Her father is in his 67s and very active.  She used to do quite a bit of walking but has not been exercising over the past year.  She says she has a lot going on at home as she helps to take care of her husband was recently diagnosed with mild dementia. She also is raising their 68 year old grandson and also helps to take care of a 66-month-old baby.  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 She is married. Her husband was recently diagnosed with mild dementia.    Allergies  Allergen Reactions  . Sulfa Antibiotics Diarrhea and Itching    Current Outpatient Prescriptions  Medication Sig Dispense Refill  .  ALPRAZolam (XANAX) 0.5 MG tablet Take 0.25-0.5 mg by mouth every 4 (four) hours as needed. Insomnia or anxiety    . ibuprofen (ADVIL,MOTRIN) 200 MG tablet Take 300-600 mg by mouth every 6 (six) hours as needed for mild pain.    . valsartan-hydrochlorothiazide (DIOVAN-HCT) 320-12.5 MG tablet Take 1 tablet by mouth daily.    [name] venlafaxine XR (EFFEXOR-XR) 75 MG 24 hr capsule Take 75 mg by mouth daily.     No current facility-administered medications for this visit.     Past Medical History:  Diagnosis Date  . History of kidney stones    x1  . Hypertension     Past Surgical History:  Procedure Laterality Date  . CHOLECYSTECTOMY     laparoscopic  . COLONOSCOPY WITH PROPOFOL N/A 01/12/2015   Procedure: COLONOSCOPY WITH PROPOFOL;  Surgeon: Garlan Fair, MD;  Location: [facility];  Service: Endoscopy;  Laterality: N/A;  . TUBAL LIGATION      Social History   Social History  . Marital status: Married    Spouse name: N/A  . Number of children: N/A  . Years of education: N/A   Occupational History  . Not on file.   Social History Main Topics  . Smoking status: Never Smoker  . Smokeless tobacco: Never Used  . Alcohol use Yes     Comment: very rare  . Drug use: No  . Sexual activity: Not on file   Other Topics Concern  . Not on file   Social  History Narrative  . No narrative on file     No family history of premature CAD in 1st degree relatives.  Current Meds  Medication Sig  . ALPRAZolam (XANAX) 0.5 MG tablet Take 0.25-0.5 mg by mouth every 4 (four) hours as needed. Insomnia or anxiety  . ibuprofen (ADVIL,MOTRIN) 200 MG tablet Take 300-600 mg by mouth every 6 (six) hours as needed for mild pain.  . valsartan-hydrochlorothiazide (DIOVAN-HCT) 320-12.5 MG tablet Take 1 tablet by mouth daily.  [name] venlafaxine XR (EFFEXOR-XR) 75 MG 24 hr capsule Take 75 mg by mouth daily.      Review of systems complete and found to be negative unless listed above in  HPI    Physical exam Blood pressure 132/74, pulse 79, height 5\' 2"  (1.575 m), weight 178 lb (80.7 kg), SpO2 95 %. General: NAD Neck: No JVD, no thyromegaly or thyroid nodule.  Lungs: Clear to auscultation bilaterally with normal respiratory effort. CV: Nondisplaced PMI. Regular rate and rhythm, normal S1/S2, no S3/S4, no murmur.  No peripheral edema.  No carotid bruit.    Abdomen: Soft, nontender, no distention.  Skin: Intact without lesions or rashes.  Neurologic: Alert and oriented x 3.  Psych: Normal affect. Extremities: No clubbing or cyanosis.  HEENT: Normal.   ECG: Most recent ECG reviewed.   Labs: No results found for: K, BUN, CREATININE, ALT, TSH, HGB   Lipids: No results found for: LDLCALC, LDLDIRECT, CHOL, TRIG, HDL      ASSESSMENT AND PLAN:   1. Chest pain and exertional dyspnea with fatigue: Primary risk factor for ischemic heart disease is hypertension. Exertional dyspnea could be related to cardiopulmonary deconditioning. I will proceed with a nuclear myocardial perfusion imaging study to evaluate for ischemic heart disease (exercise Myoview).  2. Chronic hypertension: Controlled on present therapy which includes valsartan and Hydrochlorothiazide. No changes to therapy.     Disposition: Follow up in 6 weeks.   Signed: Kate Sable, M.D., F.A.C.C.  12/01/2016, 8:38 AM

## 2016-12-01 NOTE — Patient Instructions (Signed)
Your physician recommends that you schedule a follow-up appointment in:  6 weeks with [place]   Your physician has requested that you have en exercise stress myoview. For further information please visit [email or web address]. Please follow instruction sheet, as given.    Your physician recommends that you continue on your current medications as directed. Please refer to the Current Medication list given to you today.    If you need a refill on your cardiac medications before your next appointment, please call your pharmacy.      No lab work ordered today.      Thank you for choosing [facility] !

## 2016-12-12 ENCOUNTER — Encounter (HOSPITAL_BASED_OUTPATIENT_CLINIC_OR_DEPARTMENT_OTHER)
Admission: RE | Admit: 2016-12-12 | Discharge: 2016-12-12 | Disposition: A | Discharge status: Home / Self Care | Payer: Medicare Other | Source: Ambulatory Visit | Attending: Cardiovascular Disease | Admitting: Cardiovascular Disease

## 2016-12-12 ENCOUNTER — Encounter (HOSPITAL_COMMUNITY)
Admission: RE | Admit: 2016-12-12 | Discharge: 2016-12-12 | Disposition: A | Discharge status: Home / Self Care | Payer: Medicare Other | Source: Ambulatory Visit | Attending: Cardiovascular Disease | Admitting: Cardiovascular Disease

## 2016-12-12 ENCOUNTER — Encounter (HOSPITAL_COMMUNITY): Payer: Self-pay

## 2016-12-12 DIAGNOSIS — R079 Chest pain, unspecified: Secondary | ICD-10-CM

## 2016-12-12 DIAGNOSIS — R0609 Other forms of dyspnea: Secondary | ICD-10-CM

## 2016-12-12 LAB — NM MYOCAR MULTI W/SPECT W/WALL MOTION / EF
CHL CUP NUCLEAR SDS: 0
CHL CUP NUCLEAR SRS: 0
CHL CUP NUCLEAR SSS: 0
CHL RATE OF PERCEIVED EXERTION: 13
CSEPED: 7 min
CSEPEW: 8.7 METS
CSEPPHR: 137 {beats}/min
Exercise duration (sec): 41 s
LV dias vol: 58 mL (ref 46–106)
LV sys vol: 22 mL
MPHR: 152 {beats}/min
NUC STRESS TID: 1.09
Percent HR: 90 %
RATE: 0.31
Rest HR: 66 {beats}/min

## 2016-12-12 MED ORDER — TECHNETIUM TC 99M TETROFOSMIN IV KIT
10.0000 | PACK | Freq: Once | INTRAVENOUS | Status: AC | PRN
  Administered 2016-12-12: 9.2 via INTRAVENOUS

## 2016-12-12 MED ORDER — REGADENOSON 0.4 MG/5ML IV SOLN
INTRAVENOUS | Status: AC
  Filled 2016-12-12: qty 5

## 2016-12-12 MED ORDER — TECHNETIUM TC 99M TETROFOSMIN IV KIT
30.0000 | PACK | Freq: Once | INTRAVENOUS | Status: AC | PRN
  Administered 2016-12-12: 27 via INTRAVENOUS

## 2016-12-12 MED ORDER — SODIUM CHLORIDE 0.9% FLUSH
INTRAVENOUS | Status: AC
  Administered 2016-12-12: 10 mL via INTRAVENOUS
  Filled 2016-12-12: qty 10

## 2017-01-16 ENCOUNTER — Ambulatory Visit: Payer: Medicare Other | Admitting: Cardiovascular Disease

## 2017-02-28 ENCOUNTER — Ambulatory Visit: Payer: Medicare Other | Admitting: Cardiovascular Disease

## 2017-02-28 ENCOUNTER — Encounter: Payer: Self-pay | Admitting: Cardiovascular Disease

## 2017-02-28 VITALS — BP 150/78 | HR 87 | Ht 62.0 in | Wt 175.0 lb

## 2017-02-28 DIAGNOSIS — I10 Essential (primary) hypertension: Secondary | ICD-10-CM

## 2017-02-28 DIAGNOSIS — R0609 Other forms of dyspnea: Secondary | ICD-10-CM

## 2017-02-28 DIAGNOSIS — R079 Chest pain, unspecified: Secondary | ICD-10-CM | POA: Diagnosis not present

## 2017-02-28 DIAGNOSIS — Z7182 Exercise counseling: Secondary | ICD-10-CM

## 2017-02-28 NOTE — Progress Notes (Signed)
SUBJECTIVE: The patient returns for follow-up after undergoing cardiovascular testing performed for the evaluation of chest pain.  Nuclear stress test on 12/12/16 was low risk with no evidence of myocardial ischemia or scar, LVEF 62%.  She had a low risk Duke treadmill score of 5.  Blood pressure demonstrated a hypertensive response to exercise.  She started using CPAP a few months ago and now finally has a good night sleep.  She has an in-home gym and knows she needs to exercise.  She said she is going to move her elliptical from the attic to her bedroom.  She has had no recurrence of chest pain.  She does have exertional dyspnea when climbing stairs and attributes this to being overweight and out of shape.   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 She is married. Her husband was recently diagnosed with mild dementia.   Review of Systems: As per "subjective", otherwise negative.  Allergies  Allergen Reactions  . Sulfa Antibiotics Diarrhea and Itching    Current Outpatient Medications  Medication Sig Dispense Refill  . ALPRAZolam (XANAX) 0.5 MG tablet Take 0.25-0.5 mg by mouth every 4 (four) hours as needed. Insomnia or anxiety    . ibuprofen (ADVIL,MOTRIN) 200 MG tablet Take 300-600 mg by mouth every 6 (six) hours as needed for mild pain.    . valsartan-hydrochlorothiazide (DIOVAN-HCT) 320-12.5 MG tablet Take 1 tablet by mouth daily.    [name] venlafaxine XR (EFFEXOR-XR) 75 MG 24 hr capsule Take 75 mg by mouth daily.     No current facility-administered medications for this visit.     Past Medical History:  Diagnosis Date  . History of kidney stones    x1  . Hypertension     Past Surgical History:  Procedure Laterality Date  . CHOLECYSTECTOMY     laparoscopic  . COLONOSCOPY WITH PROPOFOL N/A 01/12/2015   Procedure: COLONOSCOPY WITH PROPOFOL;  Surgeon: Garlan Fair, MD;  Location: [facility];  Service: Endoscopy;   Laterality: N/A;  . TUBAL LIGATION      Social History   Socioeconomic History  . Marital status: Married    Spouse name: Not on file  . Number of children: Not on file  . Years of education: Not on file  . Highest education level: Not on file  Social Needs  . Financial resource strain: Not on file  . Food insecurity - worry: Not on file  . Food insecurity - inability: Not on file  . Transportation needs - medical: Not on file  . Transportation needs - non-medical: Not on file  Occupational History  . Not on file  Tobacco Use  . Smoking status: Never Smoker  . Smokeless tobacco: Never Used  Substance and Sexual Activity  . Alcohol use: Yes    Comment: very rare  . Drug use: No  . Sexual activity: Not on file  Other Topics Concern  . Not on file  Social History Narrative  . Not on file     Vitals:   02/28/17 1006  BP: (!) 150/78  Pulse: 87  SpO2: 98%  Weight: 175 lb (79.4 kg)  Height: 5\' 2"  (1.575 m)    Wt Readings from Last 3 Encounters:  02/28/17 175 lb (79.4 kg)  12/01/16 178 lb (80.7 kg)  01/12/15 166 lb (75.3 kg)     PHYSICAL EXAM General: NAD HEENT: Normal. Neck: No JVD, no thyromegaly. Lungs: Clear to auscultation bilaterally with  normal respiratory effort. CV: Regular rate and rhythm, normal S1/S2, no S3/S4, no murmur. No pretibial or periankle edema.  No carotid bruit.   Abdomen: Soft, nontender, no distention.  Neurologic: Alert and oriented.  Psych: Normal affect. Skin: Normal. Musculoskeletal: No gross deformities.    ECG: Most recent ECG reviewed.   Labs: No results found for: K, BUN, CREATININE, ALT, TSH, HGB   Lipids: No results found for: LDLCALC, LDLDIRECT, CHOL, TRIG, HDL     ASSESSMENT AND PLAN: 1.  Chest pain and exertional dyspnea with fatigue: Given her low risk stress test as detailed above, symptoms are likely related to cardiopulmonary deconditioning.  We talked about exercise and weight loss at length.  Symptoms have  improved.  No further testing is indicated at this time.  2.  Chronic hypertension: Blood pressure is elevated today.  She said it was normal at her PCPs office.  She did demonstrate a hypertensive response to exercise with her stress test.  This will need further monitoring.  Again, we talked about exercise as a lifestyle modification in order to help reduce blood pressure.    Disposition: Follow up as needed    Kate Sable, M.D., F.A.C.C.

## 2017-02-28 NOTE — Patient Instructions (Signed)
Your physician recommends that you schedule a follow-up appointment in:  As needed with Dr.Koneswaran      Thank you for choosing [facility] !

## 2017-04-06 ENCOUNTER — Encounter (HOSPITAL_COMMUNITY): Payer: Self-pay

## 2017-04-06 ENCOUNTER — Inpatient Hospital Stay (HOSPITAL_COMMUNITY)
Admission: EM | Admit: 2017-04-06 | Discharge: 2017-04-11 | DRG: 062 | Disposition: A | Discharge status: Rehabilitation Facility | Payer: Medicare Other | Attending: Neurology | Admitting: Neurology

## 2017-04-06 ENCOUNTER — Emergency Department (HOSPITAL_COMMUNITY): Payer: Medicare Other

## 2017-04-06 ENCOUNTER — Inpatient Hospital Stay (HOSPITAL_COMMUNITY): Payer: Medicare Other

## 2017-04-06 DIAGNOSIS — R0682 Tachypnea, not elsewhere classified: Secondary | ICD-10-CM

## 2017-04-06 DIAGNOSIS — F329 Major depressive disorder, single episode, unspecified: Secondary | ICD-10-CM | POA: Diagnosis present

## 2017-04-06 DIAGNOSIS — Z7289 Other problems related to lifestyle: Secondary | ICD-10-CM | POA: Diagnosis not present

## 2017-04-06 DIAGNOSIS — B349 Viral infection, unspecified: Secondary | ICD-10-CM | POA: Diagnosis present

## 2017-04-06 DIAGNOSIS — R7303 Prediabetes: Secondary | ICD-10-CM

## 2017-04-06 DIAGNOSIS — E785 Hyperlipidemia, unspecified: Secondary | ICD-10-CM | POA: Diagnosis present

## 2017-04-06 DIAGNOSIS — D72829 Elevated white blood cell count, unspecified: Secondary | ICD-10-CM

## 2017-04-06 DIAGNOSIS — I69351 Hemiplegia and hemiparesis following cerebral infarction affecting right dominant side: Secondary | ICD-10-CM | POA: Diagnosis not present

## 2017-04-06 DIAGNOSIS — G8191 Hemiplegia, unspecified affecting right dominant side: Secondary | ICD-10-CM | POA: Diagnosis present

## 2017-04-06 DIAGNOSIS — E871 Hypo-osmolality and hyponatremia: Secondary | ICD-10-CM | POA: Diagnosis not present

## 2017-04-06 DIAGNOSIS — Z6831 Body mass index (BMI) 31.0-31.9, adult: Secondary | ICD-10-CM | POA: Diagnosis not present

## 2017-04-06 DIAGNOSIS — R5383 Other fatigue: Secondary | ICD-10-CM | POA: Diagnosis not present

## 2017-04-06 DIAGNOSIS — I69391 Dysphagia following cerebral infarction: Secondary | ICD-10-CM

## 2017-04-06 DIAGNOSIS — R4701 Aphasia: Secondary | ICD-10-CM | POA: Diagnosis not present

## 2017-04-06 DIAGNOSIS — I5189 Other ill-defined heart diseases: Secondary | ICD-10-CM

## 2017-04-06 DIAGNOSIS — I63512 Cerebral infarction due to unspecified occlusion or stenosis of left middle cerebral artery: Secondary | ICD-10-CM | POA: Diagnosis present

## 2017-04-06 DIAGNOSIS — I10 Essential (primary) hypertension: Secondary | ICD-10-CM

## 2017-04-06 DIAGNOSIS — R29709 NIHSS score 9: Secondary | ICD-10-CM | POA: Diagnosis present

## 2017-04-06 DIAGNOSIS — E876 Hypokalemia: Secondary | ICD-10-CM | POA: Diagnosis not present

## 2017-04-06 DIAGNOSIS — I639 Cerebral infarction, unspecified: Secondary | ICD-10-CM | POA: Diagnosis not present

## 2017-04-06 DIAGNOSIS — E669 Obesity, unspecified: Secondary | ICD-10-CM | POA: Diagnosis not present

## 2017-04-06 DIAGNOSIS — R0989 Other specified symptoms and signs involving the circulatory and respiratory systems: Secondary | ICD-10-CM | POA: Diagnosis not present

## 2017-04-06 DIAGNOSIS — R233 Spontaneous ecchymoses: Secondary | ICD-10-CM | POA: Diagnosis present

## 2017-04-06 DIAGNOSIS — I63312 Cerebral infarction due to thrombosis of left middle cerebral artery: Secondary | ICD-10-CM

## 2017-04-06 DIAGNOSIS — I519 Heart disease, unspecified: Secondary | ICD-10-CM | POA: Diagnosis not present

## 2017-04-06 DIAGNOSIS — I6300 Cerebral infarction due to thrombosis of unspecified precerebral artery: Secondary | ICD-10-CM | POA: Diagnosis not present

## 2017-04-06 DIAGNOSIS — R740 Nonspecific elevation of levels of transaminase and lactic acid dehydrogenase [LDH]: Secondary | ICD-10-CM | POA: Diagnosis not present

## 2017-04-06 DIAGNOSIS — F331 Major depressive disorder, recurrent, moderate: Secondary | ICD-10-CM | POA: Diagnosis not present

## 2017-04-06 DIAGNOSIS — Z79899 Other long term (current) drug therapy: Secondary | ICD-10-CM

## 2017-04-06 DIAGNOSIS — I63412 Cerebral infarction due to embolism of left middle cerebral artery: Principal | ICD-10-CM | POA: Diagnosis present

## 2017-04-06 DIAGNOSIS — Z882 Allergy status to sulfonamides status: Secondary | ICD-10-CM

## 2017-04-06 DIAGNOSIS — Z87442 Personal history of urinary calculi: Secondary | ICD-10-CM

## 2017-04-06 DIAGNOSIS — R29810 Facial weakness: Secondary | ICD-10-CM | POA: Diagnosis present

## 2017-04-06 DIAGNOSIS — R1310 Dysphagia, unspecified: Secondary | ICD-10-CM | POA: Diagnosis present

## 2017-04-06 DIAGNOSIS — I169 Hypertensive crisis, unspecified: Secondary | ICD-10-CM | POA: Diagnosis not present

## 2017-04-06 DIAGNOSIS — F32A Depression, unspecified: Secondary | ICD-10-CM

## 2017-04-06 DIAGNOSIS — R799 Abnormal finding of blood chemistry, unspecified: Secondary | ICD-10-CM | POA: Diagnosis not present

## 2017-04-06 DIAGNOSIS — I351 Nonrheumatic aortic (valve) insufficiency: Secondary | ICD-10-CM | POA: Diagnosis not present

## 2017-04-06 LAB — DIFFERENTIAL
BASOS PCT: 0 %
Basophils Absolute: 0 10*3/uL (ref 0.0–0.1)
EOS ABS: 0 10*3/uL (ref 0.0–0.7)
Eosinophils Relative: 0 %
Lymphocytes Relative: 13 %
Lymphs Abs: 1.5 10*3/uL (ref 0.7–4.0)
MONO ABS: 0.8 10*3/uL (ref 0.1–1.0)
MONOS PCT: 7 %
Neutro Abs: 9.3 10*3/uL — ABNORMAL HIGH (ref 1.7–7.7)
Neutrophils Relative %: 80 %

## 2017-04-06 LAB — I-STAT CHEM 8, ED
BUN: 11 mg/dL (ref 6–20)
CALCIUM ION: 1.12 mmol/L — AB (ref 1.15–1.40)
Chloride: 102 mmol/L (ref 101–111)
Creatinine, Ser: 0.8 mg/dL (ref 0.44–1.00)
GLUCOSE: 141 mg/dL — AB (ref 65–99)
HCT: 46 % (ref 36.0–46.0)
HEMOGLOBIN: 15.6 g/dL — AB (ref 12.0–15.0)
POTASSIUM: 3.7 mmol/L (ref 3.5–5.1)
Sodium: 139 mmol/L (ref 135–145)
TCO2: 26 mmol/L (ref 22–32)

## 2017-04-06 LAB — CBC
HEMATOCRIT: 47.4 % — AB (ref 36.0–46.0)
Hemoglobin: 15.9 g/dL — ABNORMAL HIGH (ref 12.0–15.0)
MCH: 30.5 pg (ref 26.0–34.0)
MCHC: 33.5 g/dL (ref 30.0–36.0)
MCV: 90.8 fL (ref 78.0–100.0)
Platelets: 236 10*3/uL (ref 150–400)
RBC: 5.22 MIL/uL — ABNORMAL HIGH (ref 3.87–5.11)
RDW: 13.1 % (ref 11.5–15.5)
WBC: 11.5 10*3/uL — ABNORMAL HIGH (ref 4.0–10.5)

## 2017-04-06 LAB — PROTIME-INR
INR: 1.18
Prothrombin Time: 14.9 seconds (ref 11.4–15.2)

## 2017-04-06 LAB — COMPREHENSIVE METABOLIC PANEL
ALT: 53 U/L (ref 14–54)
AST: 48 U/L — ABNORMAL HIGH (ref 15–41)
Albumin: 4.1 g/dL (ref 3.5–5.0)
Alkaline Phosphatase: 63 U/L (ref 38–126)
Anion gap: 11 (ref 5–15)
BILIRUBIN TOTAL: 0.7 mg/dL (ref 0.3–1.2)
BUN: 12 mg/dL (ref 6–20)
CHLORIDE: 102 mmol/L (ref 101–111)
CO2: 25 mmol/L (ref 22–32)
Calcium: 9.4 mg/dL (ref 8.9–10.3)
Creatinine, Ser: 0.82 mg/dL (ref 0.44–1.00)
Glucose, Bld: 143 mg/dL — ABNORMAL HIGH (ref 65–99)
POTASSIUM: 3.6 mmol/L (ref 3.5–5.1)
Sodium: 138 mmol/L (ref 135–145)
TOTAL PROTEIN: 8 g/dL (ref 6.5–8.1)

## 2017-04-06 LAB — I-STAT TROPONIN, ED: TROPONIN I, POC: 0 ng/mL (ref 0.00–0.08)

## 2017-04-06 LAB — APTT: APTT: 33 s (ref 24–36)

## 2017-04-06 LAB — INFLUENZA PANEL BY PCR (TYPE A & B)
Influenza A By PCR: NEGATIVE
Influenza B By PCR: NEGATIVE

## 2017-04-06 LAB — ETHANOL: Alcohol, Ethyl (B): <10 mg/dL (ref <10)

## 2017-04-06 LAB — CBG MONITORING, ED: Glucose-Capillary: 132 mg/dL — ABNORMAL HIGH (ref 65–99)

## 2017-04-06 MED ORDER — SENNOSIDES-DOCUSATE SODIUM 8.6-50 MG PO TABS: 1.0000 | ORAL_TABLET | Freq: Every evening | ORAL | Status: DC | PRN

## 2017-04-06 MED ORDER — ALTEPLASE (STROKE) FULL DOSE INFUSION
0.9000 mg/kg | Freq: Once | INTRAVENOUS | Status: AC
  Administered 2017-04-06: 71 mg via INTRAVENOUS
  Filled 2017-04-06: qty 100

## 2017-04-06 MED ORDER — SODIUM CHLORIDE 0.9 % IV SOLN: 50.0000 mL | Freq: Once | INTRAVENOUS | Status: DC

## 2017-04-06 MED ORDER — ACETAMINOPHEN 650 MG RE SUPP: 650.0000 mg | RECTAL | Status: DC | PRN

## 2017-04-06 MED ORDER — ALTEPLASE 100 MG IV SOLR
INTRAVENOUS | Status: AC
  Administered 2017-04-06: 71 mg via INTRAVENOUS
  Filled 2017-04-06: qty 100

## 2017-04-06 MED ORDER — ACETAMINOPHEN 325 MG PO TABS: 650.0000 mg | ORAL_TABLET | ORAL | Status: DC | PRN

## 2017-04-06 MED ORDER — PANTOPRAZOLE SODIUM 40 MG IV SOLR
40.0000 mg | Freq: Every day | INTRAVENOUS | Status: DC
  Administered 2017-04-06 – 2017-04-10 (×5): 40 mg via INTRAVENOUS
  Filled 2017-04-06 (×5): qty 40

## 2017-04-06 MED ORDER — HYDROCHLOROTHIAZIDE 12.5 MG PO CAPS
12.5000 mg | ORAL_CAPSULE | Freq: Every day | ORAL | Status: DC
  Administered 2017-04-09 – 2017-04-10 (×2): 12.5 mg via ORAL
  Filled 2017-04-06 (×2): qty 1

## 2017-04-06 MED ORDER — STROKE: EARLY STAGES OF RECOVERY BOOK
Freq: Once | Status: AC
  Administered 2017-04-06: 23:00:00
  Filled 2017-04-06: qty 1

## 2017-04-06 MED ORDER — LABETALOL HCL 5 MG/ML IV SOLN
20.0000 mg | Freq: Once | INTRAVENOUS | Status: AC
  Administered 2017-04-06: 20 mg via INTRAVENOUS
  Filled 2017-04-06: qty 4

## 2017-04-06 MED ORDER — VENLAFAXINE HCL ER 75 MG PO CP24
75.0000 mg | ORAL_CAPSULE | Freq: Every day | ORAL | Status: DC
  Administered 2017-04-09 – 2017-04-11 (×3): 75 mg via ORAL
  Filled 2017-04-06 (×5): qty 1

## 2017-04-06 MED ORDER — IOPAMIDOL (ISOVUE-370) INJECTION 76%
100.0000 mL | Freq: Once | INTRAVENOUS | Status: AC | PRN
  Administered 2017-04-06: 100 mL via INTRAVENOUS

## 2017-04-06 MED ORDER — SODIUM CHLORIDE 0.9 % IV SOLN
INTRAVENOUS | Status: AC
  Administered 2017-04-06 – 2017-04-07 (×2): via INTRAVENOUS

## 2017-04-06 MED ORDER — ACETAMINOPHEN 160 MG/5ML PO SOLN: 650.0000 mg | ORAL | Status: DC | PRN

## 2017-04-06 MED ORDER — IBUPROFEN 200 MG PO TABS: 300.0000 mg | ORAL_TABLET | Freq: Four times a day (QID) | ORAL | Status: DC | PRN

## 2017-04-06 MED ORDER — VALSARTAN-HYDROCHLOROTHIAZIDE 320-12.5 MG PO TABS: 1.0000 | ORAL_TABLET | Freq: Every day | ORAL | Status: DC

## 2017-04-06 MED ORDER — IRBESARTAN 300 MG PO TABS
300.0000 mg | ORAL_TABLET | Freq: Every day | ORAL | Status: DC
  Administered 2017-04-09 – 2017-04-10 (×2): 300 mg via ORAL
  Filled 2017-04-06 (×2): qty 2

## 2017-04-06 MED ORDER — CLEVIDIPINE BUTYRATE 0.5 MG/ML IV EMUL
0.0000 mg/h | INTRAVENOUS | Status: DC
  Administered 2017-04-07 – 2017-04-08 (×3): 1 mg/h via INTRAVENOUS
  Filled 2017-04-06 (×3): qty 50

## 2017-04-06 MED ORDER — ALPRAZOLAM 0.5 MG PO TABS: 0.5000 mg | ORAL_TABLET | Freq: Three times a day (TID) | ORAL | Status: DC | PRN

## 2017-04-06 MED ORDER — ALTEPLASE (STROKE) FULL DOSE INFUSION: 0.9000 mg/kg | Freq: Once | INTRAVENOUS | Status: DC

## 2017-04-06 NOTE — ED Provider Notes (Signed)
[facility] Provider Note   CSN: [id number] Arrival date & time:      An emergency department physician performed an initial assessment on this suspected stroke patient at 72.  History   Chief Complaint Chief Complaint  Patient presents with  . Code Stroke    HPI Ebony Williams is a 67 y.o. female.  Level 5 caveat for urgent need for intervention.  Most of history obtained from EMS.  Last seen normal approximately 2:15 PM.  Patient now presents with right arm and leg weakness and neglect on right side of body.  His medical history includes hypertension.  No cigarette smoking or diabetes.  Husband reports that patient "has not felt well lately" secondary to viral illness.      Past Medical History:  Diagnosis Date  . History of kidney stones    x1  . Hypertension     Patient Active Problem List   Diagnosis Date Noted  . CVA (cerebral vascular accident) ([place]) 04/06/2017  . 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 Pain in joint, shoulder region 10/18/2011    Past Surgical History:  Procedure Laterality Date  . CHOLECYSTECTOMY     laparoscopic  . COLONOSCOPY WITH PROPOFOL N/A 01/12/2015   Procedure: COLONOSCOPY WITH PROPOFOL;  Surgeon: Garlan Fair, MD;  Location: [facility];  Service: Endoscopy;  Laterality: N/A;  . TUBAL LIGATION      OB History    No data available       Home Medications    Prior to Admission medications   Medication Sig Start Date End Date Taking? Authorizing Provider  ALPRAZolam Duanne Moron) 0.5 MG tablet Take 0.25-0.5 mg by mouth every 4 (four) hours as needed. Insomnia or anxiety 10/08/14   [provider]  ibuprofen (ADVIL,MOTRIN) 200 MG tablet Take 300-600 mg by mouth every 6 (six) hours as needed for mild pain.    [provider]  valsartan-hydrochlorothiazide (DIOVAN-HCT) 320-12.5 MG tablet Take 1 tablet by mouth daily. 10/12/14   [provider]  venlafaxine XR (EFFEXOR-XR) 75 MG 24 hr capsule  Take 75 mg by mouth daily. 10/26/14   [provider]    Family History Family History  Problem Relation Age of Onset  . Diabetes Mother   . Hypertension Mother     Social History Social History   Tobacco Use  . Smoking status: Never Smoker  . Smokeless tobacco: Never Used  Substance Use Topics  . Alcohol use: Yes    Comment: very rare  . Drug use: No     Allergies   Sulfa antibiotics   Review of Systems Review of Systems  Unable to perform ROS: Acuity of condition     Physical Exam Updated Vital Signs BP (!) 151/70   Pulse 75   Temp 98.8 F (37.1 C) (Oral)   Resp (!) 25   Wt 78.4 kg (172 lb 12 oz)   SpO2 97%   BMI 31.60 kg/m   Physical Exam  Constitutional: She appears well-developed and well-nourished.  HENT:  Head: Normocephalic and atraumatic.  Eyes: Conjunctivae are normal.  Neck: Neck supple.  Cardiovascular: Normal rate and regular rhythm.  Pulmonary/Chest: Effort normal and breath sounds normal.  Abdominal: Soft. Bowel sounds are normal.  Musculoskeletal: Normal range of motion.  Skin: Skin is warm and dry.  Psychiatric: She has a normal mood and affect. Her behavior is normal.  Nursing note and vitals reviewed.    ED Treatments / Results  Labs (all labs ordered are  listed, but only abnormal results are displayed) Labs Reviewed  CBC - Abnormal; Notable for the following components:      Result Value   WBC 11.5 (*)    RBC 5.22 (*)    Hemoglobin 15.9 (*)    HCT 47.4 (*)    All other components within normal limits  DIFFERENTIAL - Abnormal; Notable for the following components:   Neutro Abs 9.3 (*)    All other components within normal limits  COMPREHENSIVE METABOLIC PANEL - Abnormal; Notable for the following components:   Glucose, Bld 143 (*)    AST 48 (*)    All other components within normal limits  I-STAT CHEM 8, ED - Abnormal; Notable for the following components:   Glucose, Bld 141 (*)    Calcium, Ion 1.12 (*)     Hemoglobin 15.6 (*)    All other components within normal limits  CBG MONITORING, ED - Abnormal; Notable for the following components:   Glucose-Capillary 132 (*)    All other components within normal limits  ETHANOL  PROTIME-INR  APTT  RAPID URINE DRUG SCREEN, HOSP PERFORMED  URINALYSIS, ROUTINE W REFLEX MICROSCOPIC  I-STAT TROPONIN, ED    EKG  EKG Interpretation  Date/Time:  Friday April 06 2017 17:24:55 EST Ventricular Rate:  82 PR Interval:    QRS Duration: 86 QT Interval:  393 QTC Calculation: 459 R Axis:   -10 Text Interpretation:  Sinus rhythm Abnormal R-wave progression, early transition Confirmed by Nat Christen [phone]) on 04/06/2017 5:47:42 PM       Radiology Ct Angio Head W Or Wo Contrast  Result Date: 04/06/2017 CLINICAL DATA:  RIGHT-sided deficits.  Follow-up stroke. EXAM: CT ANGIOGRAPHY HEAD AND NECK CT PERFUSION BRAIN TECHNIQUE: Multidetector CT imaging of the head and neck was performed using the standard protocol during bolus administration of intravenous contrast. Multiplanar CT image reconstructions and MIPs were obtained to evaluate the vascular anatomy. Carotid stenosis measurements (when applicable) are obtained utilizing NASCET criteria, using the distal internal carotid diameter as the denominator. Multiphase CT imaging of the brain was performed following IV bolus contrast injection. Subsequent parametric perfusion maps were calculated using RAPID software. CONTRAST:  135mL ISOVUE-370 IOPAMIDOL (ISOVUE-370) INJECTION 76% COMPARISON:  CT HEAD April 06, 2017 at 1708 hours FINDINGS: CTA NECK FINDINGS: AORTIC ARCH: Ascending aorta is mildly enlarged at 3.7 cm. 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 Common origin of bilateral Common carotid artery's. The origins of the innominate, left Common carotid artery and subclavian artery are widely patent. RIGHT CAROTID SYSTEM: Common carotid artery is widely patent, medial course.  Normal appearance of the carotid bifurcation without hemodynamically significant stenosis by NASCET criteria. Patent retropharyngeal internal carotid artery. LEFT CAROTID SYSTEM: Common carotid artery is widely patent, tortuous vessel. Trace calcific atherosclerosis carotid bifurcation without hemodynamically significant stenosis by NASCET criteria. Normal appearance of the internal carotid artery. VERTEBRAL ARTERIES:Left vertebral artery is dominant. Normal appearance of the vertebral arteries, widely patent. SKELETON: No acute osseous process though bone windows have not been submitted. Moderate to severe C6-7 degenerative disc. 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 OTHER NECK: Soft tissues of the neck are nonacute though, not tailored for evaluation. UPPER CHEST: Mild suspected vascular congestion. CTA HEAD FINDINGS: ANTERIOR CIRCULATION: Patent cervical internal carotid arteries, petrous, cavernous and supra clinoid internal carotid arteries. Patent anterior communicating artery. 1 cm segment severe stenosis LEFT M1 origin. Moderate stenosis distal RIGHT M1 segment. No large vessel occlusion, significant  stenosis, contrast extravasation or aneurysm. POSTERIOR CIRCULATION: Patent vertebral arteries, vertebrobasilar junction and basilar artery, as well as main branch vessels. Fenestrated proximal basilar artery. Patent posterior cerebral arteries, mild luminal irregularity compatible with atherosclerosis. No large vessel occlusion, significant stenosis, contrast extravasation or aneurysm. VENOUS SINUSES: Major dural venous sinuses are patent though not tailored for evaluation on this angiographic examination. ANATOMIC VARIANTS: None. DELAYED PHASE: No abnormal intracranial enhancement. 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 MIP images reviewed. CT Brain Perfusion Findings: CBF (<30%) Volume: 83mL Perfusion (Tmax>6.0s) volume: FormL  Mismatch Volume: 79mL Infarction Location:Fixed perfusion defect LEFT parietoccipital junction. Prolonged T-max at 4 seconds though, normalized by 8 seconds. In addition, asymmetrically decreased LEFT basal ganglia blood flow with prolonged T-max. IMPRESSION: CTA NECK: 1. No hemodynamically significant stenosis or acute vascular process. 2. Incidental aberrant RIGHT subclavian artery. 3. **An incidental finding of potential clinical significance has been found. 3.7 cm ascending aorta. Recommend annual imaging followup by CTA or MRA. This recommendation follows 2010 ACCF/AHA/AATS/ACR/ASA/SCA/SCAI/SIR/STS/SVM Guidelines for the Diagnosis and Management of Patients with Thoracic Aortic Disease. Circulation.2010; 121: Y101-B510** CTA HEAD: 1. 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 2. No emergent large vessel occlusion. CT PERFUSION: 1. Fixed LEFT parietoccipital junction perfusion defect (posterior watershed territory). 2. LEFT basal ganglia perfusion abnormality could reflect infarct or, penumbra and is not demonstrated on final map. 3. Prolonged LEFT MCA T-max on early phase, normalized by 8 seconds compatible with proximal stenosis. Electronically Signed   By: Elon Alas M.D.   On: 04/06/2017 18:48   Ct Angio Neck W Or Wo Contrast  Result Date: 04/06/2017 CLINICAL DATA:  RIGHT-sided deficits.  Follow-up stroke. EXAM: CT ANGIOGRAPHY HEAD AND NECK CT PERFUSION BRAIN TECHNIQUE: Multidetector CT imaging of the head and neck was performed using the standard protocol during bolus administration of intravenous contrast. Multiplanar CT image reconstructions and MIPs were obtained to evaluate the vascular anatomy. Carotid stenosis measurements (when applicable) are obtained utilizing NASCET criteria, using the distal internal carotid diameter as the denominator. Multiphase CT imaging of the brain was performed following IV bolus contrast injection. Subsequent parametric perfusion maps were  calculated using RAPID software. CONTRAST:  180mL ISOVUE-370 IOPAMIDOL (ISOVUE-370) INJECTION 76% COMPARISON:  CT HEAD April 06, 2017 at 1708 hours FINDINGS: CTA NECK FINDINGS: AORTIC ARCH: Ascending aorta is mildly enlarged at 3.7 cm. 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 Common origin of bilateral Common carotid artery's. The origins of the innominate, left Common carotid artery and subclavian artery are widely patent. RIGHT CAROTID SYSTEM: Common carotid artery is widely patent, medial course. Normal appearance of the carotid bifurcation without hemodynamically significant stenosis by NASCET criteria. Patent retropharyngeal internal carotid artery. LEFT CAROTID SYSTEM: Common carotid artery is widely patent, tortuous vessel. Trace calcific atherosclerosis carotid bifurcation without hemodynamically significant stenosis by NASCET criteria. Normal appearance of the internal carotid artery. VERTEBRAL ARTERIES:Left vertebral artery is dominant. Normal appearance of the vertebral arteries, widely patent. SKELETON: No acute osseous process though bone windows have not been submitted. Moderate to severe C6-7 degenerative disc. 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 OTHER NECK: Soft tissues of the neck are nonacute though, not tailored for evaluation. UPPER CHEST: Mild suspected vascular congestion. CTA HEAD FINDINGS: ANTERIOR CIRCULATION: Patent cervical internal carotid arteries, petrous, cavernous and supra clinoid internal carotid arteries. Patent anterior communicating artery. 1 cm segment severe stenosis LEFT M1 origin. Moderate stenosis distal RIGHT M1 segment. No large vessel occlusion, significant stenosis,  contrast extravasation or aneurysm. POSTERIOR CIRCULATION: Patent vertebral arteries, vertebrobasilar junction and basilar artery, as well as main branch vessels. Fenestrated proximal basilar artery. Patent  posterior cerebral arteries, mild luminal irregularity compatible with atherosclerosis. No large vessel occlusion, significant stenosis, contrast extravasation or aneurysm. VENOUS SINUSES: Major dural venous sinuses are patent though not tailored for evaluation on this angiographic examination. ANATOMIC VARIANTS: None. DELAYED PHASE: No abnormal intracranial enhancement. 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 MIP images reviewed. CT Brain Perfusion Findings: CBF (<30%) Volume: 19mL Perfusion (Tmax>6.0s) volume: FormL Mismatch Volume: 41mL Infarction Location:Fixed perfusion defect LEFT parietoccipital junction. Prolonged T-max at 4 seconds though, normalized by 8 seconds. In addition, asymmetrically decreased LEFT basal ganglia blood flow with prolonged T-max. IMPRESSION: CTA NECK: 1. No hemodynamically significant stenosis or acute vascular process. 2. Incidental aberrant RIGHT subclavian artery. 3. **An incidental finding of potential clinical significance has been found. 3.7 cm ascending aorta. Recommend annual imaging followup by CTA or MRA. This recommendation follows 2010 ACCF/AHA/AATS/ACR/ASA/SCA/SCAI/SIR/STS/SVM Guidelines for the Diagnosis and Management of Patients with Thoracic Aortic Disease. Circulation.2010; 121: S283-T517** CTA HEAD: 1. 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 2. No emergent large vessel occlusion. CT PERFUSION: 1. Fixed LEFT parietoccipital junction perfusion defect (posterior watershed territory). 2. LEFT basal ganglia perfusion abnormality could reflect infarct or, penumbra and is not demonstrated on final map. 3. Prolonged LEFT MCA T-max on early phase, normalized by 8 seconds compatible with proximal stenosis. Electronically Signed   By: Elon Alas M.D.   On: 04/06/2017 18:48   Ct Cerebral Perfusion W Contrast  Result Date: 04/06/2017 CLINICAL DATA:  RIGHT-sided deficits.  Follow-up stroke. EXAM: CT ANGIOGRAPHY HEAD  AND NECK CT PERFUSION BRAIN TECHNIQUE: Multidetector CT imaging of the head and neck was performed using the standard protocol during bolus administration of intravenous contrast. Multiplanar CT image reconstructions and MIPs were obtained to evaluate the vascular anatomy. Carotid stenosis measurements (when applicable) are obtained utilizing NASCET criteria, using the distal internal carotid diameter as the denominator. Multiphase CT imaging of the brain was performed following IV bolus contrast injection. Subsequent parametric perfusion maps were calculated using RAPID software. CONTRAST:  173mL ISOVUE-370 IOPAMIDOL (ISOVUE-370) INJECTION 76% COMPARISON:  CT HEAD April 06, 2017 at 1708 hours FINDINGS: CTA NECK FINDINGS: AORTIC ARCH: Ascending aorta is mildly enlarged at 3.7 cm. 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 Common origin of bilateral Common carotid artery's. The origins of the innominate, left Common carotid artery and subclavian artery are widely patent. RIGHT CAROTID SYSTEM: Common carotid artery is widely patent, medial course. Normal appearance of the carotid bifurcation without hemodynamically significant stenosis by NASCET criteria. Patent retropharyngeal internal carotid artery. LEFT CAROTID SYSTEM: Common carotid artery is widely patent, tortuous vessel. Trace calcific atherosclerosis carotid bifurcation without hemodynamically significant stenosis by NASCET criteria. Normal appearance of the internal carotid artery. VERTEBRAL ARTERIES:Left vertebral artery is dominant. Normal appearance of the vertebral arteries, widely patent. SKELETON: No acute osseous process though bone windows have not been submitted. Moderate to severe C6-7 degenerative disc. 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 OTHER NECK: Soft tissues of the neck are nonacute though, not tailored for evaluation. UPPER CHEST: Mild suspected  vascular congestion. CTA HEAD FINDINGS: ANTERIOR CIRCULATION: Patent cervical internal carotid arteries, petrous, cavernous and supra clinoid internal carotid arteries. Patent anterior communicating artery. 1 cm segment severe stenosis LEFT M1 origin. Moderate stenosis distal RIGHT M1 segment. No large vessel occlusion, significant stenosis, contrast extravasation  or aneurysm. POSTERIOR CIRCULATION: Patent vertebral arteries, vertebrobasilar junction and basilar artery, as well as main branch vessels. Fenestrated proximal basilar artery. Patent posterior cerebral arteries, mild luminal irregularity compatible with atherosclerosis. No large vessel occlusion, significant stenosis, contrast extravasation or aneurysm. VENOUS SINUSES: Major dural venous sinuses are patent though not tailored for evaluation on this angiographic examination. ANATOMIC VARIANTS: None. DELAYED PHASE: No abnormal intracranial enhancement. 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 MIP images reviewed. CT Brain Perfusion Findings: CBF (<30%) Volume: 66mL Perfusion (Tmax>6.0s) volume: FormL Mismatch Volume: 91mL Infarction Location:Fixed perfusion defect LEFT parietoccipital junction. Prolonged T-max at 4 seconds though, normalized by 8 seconds. In addition, asymmetrically decreased LEFT basal ganglia blood flow with prolonged T-max. IMPRESSION: CTA NECK: 1. No hemodynamically significant stenosis or acute vascular process. 2. Incidental aberrant RIGHT subclavian artery. 3. **An incidental finding of potential clinical significance has been found. 3.7 cm ascending aorta. Recommend annual imaging followup by CTA or MRA. This recommendation follows 2010 ACCF/AHA/AATS/ACR/ASA/SCA/SCAI/SIR/STS/SVM Guidelines for the Diagnosis and Management of Patients with Thoracic Aortic Disease. Circulation.2010; 121: S283-T517** CTA HEAD: 1. 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 2. No emergent large vessel  occlusion. CT PERFUSION: 1. Fixed LEFT parietoccipital junction perfusion defect (posterior watershed territory). 2. LEFT basal ganglia perfusion abnormality could reflect infarct or, penumbra and is not demonstrated on final map. 3. Prolonged LEFT MCA T-max on early phase, normalized by 8 seconds compatible with proximal stenosis. Electronically Signed   By: Elon Alas M.D.   On: 04/06/2017 18:48   Ct Head Code Stroke Wo Contrast  Result Date: 04/06/2017 CLINICAL DATA:  Code stroke. RIGHT-sided deficits, altered mental status. EXAM: CT HEAD WITHOUT CONTRAST TECHNIQUE: Contiguous axial images were obtained from the base of the skull through the vertex without intravenous contrast. COMPARISON:  None. FINDINGS: BRAIN: No intraparenchymal hemorrhage, mass effect nor midline shift. The ventricles and sulci are normal for age. Confluent supratentorial white matter hypodensities. Loss of LEFT basal ganglia gray-white matter differentiation. Additional old bilateral basal ganglia lacunar infarcts. Old RIGHT and possibly LEFT thalamus lacunar infarcts. Known abnormal extra-axial fluid collections. Basal cisterns are patent. No abnormal extra-axial fluid collections. Basal cisterns are patent. VASCULAR: Moderate calcific atherosclerosis of the carotid siphons. SKULL: No skull fracture. No significant scalp soft tissue swelling. SINUSES/ORBITS: Lobulated severe maxillary sinus opacification. Moderate remaining paranasal sinus lobulated mucosal thickening. Mastoid air cells are well aerated.The included ocular globes and orbital contents are non-suspicious. OTHER: None. ASPECTS [facility] Stroke Program Early CT Score) - Ganglionic level infarction (caudate, lentiform nuclei, internal capsule, insula, M1-M3 cortex): 5 - Supraganglionic infarction (M4-M6 cortex): 3 Total score (0-10 with 10 being normal): 8 IMPRESSION: 1. Acute suspected LEFT basal ganglia nonhemorrhagic infarct (MCA territory versus perforated ir). 2.  Moderate to severe chronic small vessel ischemic disease and old lacunar infarcts. 3. Critical Value/emergent results were called by telephone at the time of interpretation on 04/06/2017 at 5:25 pm to Dr. Nat Christen , who verbally acknowledged these results. 4. ASPECTS is 8. Electronically Signed   By: Elon Alas M.D.   On: 04/06/2017 17:31    Procedures Procedures (including critical care time)  Medications Ordered in ED Medications  alteplase (ACTIVASE) 1 mg/mL infusion 71 mg (0 mg/kg  79.4 kg Intravenous Stopped 04/06/17 1856)    Followed by  0.9 %  sodium chloride infusion (not administered)  iopamidol (ISOVUE-370) 76 % injection 100 mL (100 mLs Intravenous Contrast Given 04/06/17 1759)     Initial Impression / Assessment and Plan /  ED Course  I have reviewed the triage vital signs and the nursing notes.  Pertinent labs & imaging results that were available during my care of the patient were reviewed by me and considered in my medical decision making (see chart for details).     Patient presents with right-sided weakness and right-sided neglect.  Code stroke was initiated immediately.  CT head reveals an acute left sided basal ganglia nonhemorrhagic infarct. TPA was administered via protocol.  Patient appeared to have a positive response.  Her airway is protected.  Discussed with neuro hospitalist at [facility].  Will transfer patient to [facility].  CRITICAL CARE Performed by: Nat Christen Total critical care time: 40 minutes Critical care time was exclusive of separately billable procedures and treating other patients. Critical care was necessary to treat or prevent imminent or life-threatening deterioration. Critical care was time spent personally by me on the following activities: development of treatment plan with patient and/or surrogate as well as nursing, discussions with consultants, evaluation of patient's response to treatment, examination of patient, obtaining history from  patient or surrogate, ordering and performing treatments and interventions, ordering and review of laboratory studies, ordering and review of radiographic studies, pulse oximetry and re-evaluation of patient's condition.  Final Clinical Impressions(s) / ED Diagnoses   Final diagnoses:  Cerebrovascular accident (CVA), unspecified mechanism [facility])    ED Discharge Orders    None       Nat Christen, MD 04/06/17 1931

## 2017-04-06 NOTE — Progress Notes (Signed)
Code Stroke Times:  9518 Call Time  1703 Beeper time 8416 Exam Started 6063 Exam [id number]  2355 Images sent, Exam Completed, GR completed

## 2017-04-06 NOTE — ED Notes (Signed)
RN alerted of Condition change. MD at bedside updating family

## 2017-04-06 NOTE — ED Triage Notes (Signed)
Per ems pt has r sided weakness, r sided facial droop, ams x 2 hours ago per family.  EDP cleared pt for ct upon arrival.

## 2017-04-06 NOTE — ED Notes (Signed)
Abe People, RN given report from [place]. Other staff member in room doing Neuro exam

## 2017-04-06 NOTE — H&P (Addendum)
Admission H&P    Chief Complaint: Acute onset of right-sided weakness, left gaze and aphasia, s/p IV tPA at OSH  HPI: Ebony Williams is an 67 y.o. female who was initially evaluated at the [facility] after presenting there early this evening with acute onset 2 hours previously of right sided weakness, right facial droop and AMS. She was last seen normal by husband at 65, then found by grandson in her bedroom unresponsive at 1530. EMS was called and on their arrival, the patient was found to be gazing leftward with right-sided weakness and difficulty producing speech.    CT head at OSH revealed a  subacute left sided basal ganglia nonhemorrhagic infarct. 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 There were no contraindications to tPA; husband consented to tPA administration. LVO or proximal embolic source were possible and a CTA/CTP was obtained, which showed chronic appearing severe proximal left M1 stenosis, without LVO. There was a fixed/matched perfusion defect at the left parietal-occipital junction with increased transit time in BG, but no large cortical perfusion mismatch. The patient was determined by Teleneurology consultant to not be an intervention/thrombectomy candidate with these findings.  After tPA was administered, per EDP at OSH, the patient appeared to have a positive response.  CT head, OSH: 1. Acute suspected LEFT basal ganglia nonhemorrhagic infarct (MCA territory versus perforated ir). 2. Moderate to severe chronic small vessel ischemic disease and old lacunar infarcts. 3. Critical Value/emergent results were called by telephone at the time of interpretation on 04/06/2017 at 5:25 pm to Dr. Nat Christen , who verbally acknowledged these results. 4. ASPECTS is 8.  CTA NECK, OSH: 1. No hemodynamically significant stenosis or acute vascular process. 2. Incidental aberrant RIGHT subclavian artery. 3. An  incidental finding of potential clinical significance has been found. 3.7 cm ascending aorta. Recommend annual imaging followup by CTA or MRA.   CTA HEAD, OSH: 1. 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 2. No emergent large vessel occlusion.  CT PERFUSION, OSH: 1. Fixed LEFT parieto-occipital junction perfusion defect (posterior watershed territory). 2. LEFT basal ganglia perfusion abnormality could reflect infarct or, penumbra and is not demonstrated on final map. 3. Prolonged LEFT MCA T-max on early phase, normalized by 8 seconds compatible with proximal stenosis.  LSN: 1400 tPA Given: Yes, at OSH  PMHx includes HTN and renal stones. No prior history of stroke.    Past Medical History:  Diagnosis Date  . History of kidney stones    x1  . Hypertension     Past Surgical History:  Procedure Laterality Date  . CHOLECYSTECTOMY     laparoscopic  . COLONOSCOPY WITH PROPOFOL N/A 01/12/2015   Procedure: COLONOSCOPY WITH PROPOFOL;  Surgeon: Garlan Fair, MD;  Location: [facility];  Service: Endoscopy;  Laterality: N/A;  . TUBAL LIGATION      Family History  Problem Relation Age of Onset  . Diabetes Mother   . Hypertension Mother    Social History:  reports that  has never smoked. she has never used smokeless tobacco. She reports that she drinks alcohol. She reports that she does not use drugs.  Allergies:  Allergies  Allergen Reactions  . Sulfa Antibiotics Diarrhea and Itching    Medications Prior to Admission  Medication Sig Dispense Refill  . ibuprofen (ADVIL,MOTRIN) 200 MG tablet Take 300-600 mg by mouth every 6 (six) hours as needed for mild pain.    . valsartan-hydrochlorothiazide (DIOVAN-HCT) 320-12.5 MG tablet  Take 1 tablet by mouth daily.    [name] venlafaxine XR (EFFEXOR-XR) 75 MG 24 hr capsule Take 75 mg by mouth daily.    [name] ALPRAZolam (XANAX) 0.5 MG tablet Take 0.25-0.5 mg by mouth every 4 (four) hours as needed. Insomnia or  anxiety      ROS: As per HPI.   Physical Examination: Blood pressure (!) 155/66, pulse 71, temperature 98.8 F (37.1 C), temperature source Oral, resp. rate (!) 24, weight 78.4 kg (172 lb 12 oz), SpO2 95 %.  HEENT-  Burke/AT  Lungs - Sonorous breath sounds bilaterally without rhonchi or crackles Abdomen - Benign Extremities - Warm and well perfused  Neurologic Examination: Mental Status: Awake. Dense right sided neglect. Mute. Not following commands. Attempts to cooperate with visual cues.  Cranial Nerves: II:  No blink to threat on right. PERRL III,IV, VI: Left gaze preference. No nystagmus. Eyes conjugate.  V,VII: Right facial droop. Decreased sensation on right.  VIII: Alerts to sound IX,X: Unable to assess XI: Head preferentially rotates to left XII: Does not protrude tongue Motor/Sensory: RUE: Adducted and flexed at elbow. Reflexive withdrawal to noxiout, otherwise no movement. RLE: Extensor posturing to plantar stimulation LUE and LLE: 5/5 to visual-cue commands and noxious Deep Tendon Reflexes:  Less brisk on right than on left.  Right toe upgoing, left toe downgoing Cerebellar: Not following commands Gait: Unable to assess due to right hemiplegia.  Results for orders placed or performed during the hospital encounter of 04/06/17 (from the past 48 hour(s))  Ethanol     Status: None   Collection Time: 04/06/17  5:14 PM  Result Value Ref Range   Alcohol, Ethyl (B) <10 <10 mg/dL    Comment:        LOWEST DETECTABLE LIMIT FOR SERUM ALCOHOL IS 10 mg/dL FOR MEDICAL PURPOSES ONLY Performed at [facility], [address], [place], [place] [address]   Protime-INR     Status: None   Collection Time: 04/06/17  5:14 PM  Result Value Ref Range   Prothrombin Time 14.9 11.4 - 15.2 seconds   INR 1.18     Comment: Performed at [facility], [address] [place], [place] [address]  APTT     Status: None   Collection Time: 04/06/17  5:14 PM  Result Value Ref Range   aPTT 33 24  - 36 seconds    Comment: Performed at [facility], [address], [place], [place] [address]  CBC     Status: Abnormal   Collection Time: 04/06/17  5:14 PM  Result Value Ref Range   WBC 11.5 (H) 4.0 - 10.5 K/uL   RBC 5.22 (H) 3.87 - 5.11 MIL/uL   Hemoglobin 15.9 (H) 12.0 - 15.0 g/dL   HCT 47.4 (H) 36.0 - 46.0 %   MCV 90.8 78.0 - 100.0 fL   MCH 30.5 26.0 - 34.0 pg   MCHC 33.5 30.0 - 36.0 g/dL   RDW 13.1 11.5 - 15.5 %   Platelets 236 150 - 400 K/uL    Comment: Performed at [facility], [address] [place], [place] [address]  Differential     Status: Abnormal   Collection Time: 04/06/17  5:14 PM  Result Value Ref Range   Neutrophils Relative % 80 %   Neutro Abs 9.3 (H) 1.7 - 7.7 K/uL   Lymphocytes Relative 13 %   Lymphs Abs 1.5 0.7 - 4.0 K/uL   Monocytes Relative 7 %   Monocytes Absolute 0.8 0.1 - 1.0 K/uL  Eosinophils Relative 0 %   Eosinophils Absolute 0.0 0.0 - 0.7 K/uL   Basophils Relative 0 %   Basophils Absolute 0.0 0.0 - 0.1 K/uL    Comment: Performed at [facility], [address] [place], [place] [address]  Comprehensive metabolic panel     Status: Abnormal   Collection Time: 04/06/17  5:14 PM  Result Value Ref Range   Sodium 138 135 - 145 mmol/L   Potassium 3.6 3.5 - 5.1 mmol/L   Chloride 102 101 - 111 mmol/L   CO2 25 22 - 32 mmol/L   Glucose, Bld 143 (H) 65 - 99 mg/dL   BUN 12 6 - 20 mg/dL   Creatinine, Ser 0.82 0.44 - 1.00 mg/dL   Calcium 9.4 8.9 - 10.3 mg/dL   Total Protein 8.0 6.5 - 8.1 g/dL   Albumin 4.1 3.5 - 5.0 g/dL   AST 48 (H) 15 - 41 U/L   ALT 53 14 - 54 U/L   Alkaline Phosphatase 63 38 - 126 U/L   Total Bilirubin 0.7 0.3 - 1.2 mg/dL   GFR calc non Af Amer >60 >60 mL/min   GFR calc Af Amer >60 >60 mL/min    Comment: (NOTE) The eGFR has been calculated using the CKD EPI equation. This calculation has not been validated in all clinical situations. eGFR's persistently <60 mL/min signify possible Chronic Kidney Disease.    Anion gap 11 5 - 15     Comment: Performed at [facility], [address] [place], [place] [address]  CBG monitoring, ED     Status: Abnormal   Collection Time: 04/06/17  5:29 PM  Result Value Ref Range   Glucose-Capillary 132 (H) 65 - 99 mg/dL  I-Stat Chem 8, ED     Status: Abnormal   Collection Time: 04/06/17  5:39 PM  Result Value Ref Range   Sodium 139 135 - 145 mmol/L   Potassium 3.7 3.5 - 5.1 mmol/L   Chloride 102 101 - 111 mmol/L   BUN 11 6 - 20 mg/dL   Creatinine, Ser 0.80 0.44 - 1.00 mg/dL   Glucose, Bld 141 (H) 65 - 99 mg/dL   Calcium, Ion 1.12 (L) 1.15 - 1.40 mmol/L   TCO2 26 22 - 32 mmol/L   Hemoglobin 15.6 (H) 12.0 - 15.0 g/dL   HCT 46.0 36.0 - 46.0 %  I-stat troponin, ED     Status: None   Collection Time: 04/06/17  5:39 PM  Result Value Ref Range   Troponin i, poc 0.00 0.00 - 0.08 ng/mL   Comment 3            Comment: Due to the release kinetics of cTnI, a negative result within the first hours of the onset of symptoms does not rule out myocardial infarction with certainty. If myocardial infarction is still suspected, repeat the test at appropriate intervals.    Ct Angio Head W Or Wo Contrast  Result Date: 04/06/2017 CLINICAL DATA:  RIGHT-sided deficits.  Follow-up stroke. EXAM: CT ANGIOGRAPHY HEAD AND NECK CT PERFUSION BRAIN TECHNIQUE: Multidetector CT imaging of the head and neck was performed using the standard protocol during bolus administration of intravenous contrast. Multiplanar CT image reconstructions and MIPs were obtained to evaluate the vascular anatomy. Carotid stenosis measurements (when applicable) are obtained utilizing NASCET criteria, using the distal internal carotid diameter as the denominator. Multiphase CT imaging of the brain was performed following IV bolus contrast injection. Subsequent parametric perfusion maps were calculated using RAPID software. CONTRAST:  157m ISOVUE-370 IOPAMIDOL (ISOVUE-370) INJECTION 76% COMPARISON:  CT HEAD April 06, 2017 at 1708 hours  FINDINGS: CTA NECK FINDINGS: AORTIC ARCH: Ascending aorta is mildly enlarged at 3.7 cm. 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 Common origin of bilateral Common carotid artery's. The origins of the innominate, left Common carotid artery and subclavian artery are widely patent. RIGHT CAROTID SYSTEM: Common carotid artery is widely patent, medial course. Normal appearance of the carotid bifurcation without hemodynamically significant stenosis by NASCET criteria. Patent retropharyngeal internal carotid artery. LEFT CAROTID SYSTEM: Common carotid artery is widely patent, tortuous vessel. Trace calcific atherosclerosis carotid bifurcation without hemodynamically significant stenosis by NASCET criteria. Normal appearance of the internal carotid artery. VERTEBRAL ARTERIES:Left vertebral artery is dominant. Normal appearance of the vertebral arteries, widely patent. SKELETON: No acute osseous process though bone windows have not been submitted. Moderate to severe C6-7 degenerative disc. 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 OTHER NECK: Soft tissues of the neck are nonacute though, not tailored for evaluation. UPPER CHEST: Mild suspected vascular congestion. CTA HEAD FINDINGS: ANTERIOR CIRCULATION: Patent cervical internal carotid arteries, petrous, cavernous and supra clinoid internal carotid arteries. Patent anterior communicating artery. 1 cm segment severe stenosis LEFT M1 origin. Moderate stenosis distal RIGHT M1 segment. No large vessel occlusion, significant stenosis, contrast extravasation or aneurysm. POSTERIOR CIRCULATION: Patent vertebral arteries, vertebrobasilar junction and basilar artery, as well as main branch vessels. Fenestrated proximal basilar artery. Patent posterior cerebral arteries, mild luminal irregularity compatible with atherosclerosis. No large vessel occlusion, significant stenosis, contrast  extravasation or aneurysm. VENOUS SINUSES: Major dural venous sinuses are patent though not tailored for evaluation on this angiographic examination. ANATOMIC VARIANTS: None. DELAYED PHASE: No abnormal intracranial enhancement. 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 MIP images reviewed. CT Brain Perfusion Findings: CBF (<30%) Volume: 444mPerfusion (Tmax>6.0s) volume: FormL Mismatch Volume: 60m56mnfarction Location:Fixed perfusion defect LEFT parietoccipital junction. Prolonged T-max at 4 seconds though, normalized by 8 seconds. In addition, asymmetrically decreased LEFT basal ganglia blood flow with prolonged T-max. IMPRESSION: CTA NECK: 1. No hemodynamically significant stenosis or acute vascular process. 2. Incidental aberrant RIGHT subclavian artery. 3. **An incidental finding of potential clinical significance has been found. 3.7 cm ascending aorta. Recommend annual imaging followup by CTA or MRA. This recommendation follows 2010 ACCF/AHA/AATS/ACR/ASA/SCA/SCAI/SIR/STS/SVM Guidelines for the Diagnosis and Management of Patients with Thoracic Aortic Disease. Circulation.2010; 121: e26Z610-R604CTA HEAD: 1. 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 2. No emergent large vessel occlusion. CT PERFUSION: 1. Fixed LEFT parietoccipital junction perfusion defect (posterior watershed territory). 2. LEFT basal ganglia perfusion abnormality could reflect infarct or, penumbra and is not demonstrated on final map. 3. Prolonged LEFT MCA T-max on early phase, normalized by 8 seconds compatible with proximal stenosis. Electronically Signed   By: CouElon AlasD.   On: 04/06/2017 18:48   Ct Angio Neck W Or Wo Contrast  Result Date: 04/06/2017 CLINICAL DATA:  RIGHT-sided deficits.  Follow-up stroke. EXAM: CT ANGIOGRAPHY HEAD AND NECK CT PERFUSION BRAIN TECHNIQUE: Multidetector CT imaging of the head and neck was performed using the standard protocol during bolus  administration of intravenous contrast. Multiplanar CT image reconstructions and MIPs were obtained to evaluate the vascular anatomy. Carotid stenosis measurements (when applicable) are obtained utilizing NASCET criteria, using the distal internal carotid diameter as the denominator. Multiphase CT imaging of the brain was performed following IV bolus contrast injection. Subsequent parametric perfusion maps were calculated using RAPID software. CONTRAST:  132m ISOVUE-370 IOPAMIDOL (ISOVUE-370) INJECTION 76% COMPARISON:  CT HEAD April 06, 2017 at 1708 hours FINDINGS: CTA NECK FINDINGS: AORTIC ARCH: Ascending aorta is mildly enlarged at 3.7 cm. 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 Common origin of bilateral Common carotid artery's. The origins of the innominate, left Common carotid artery and subclavian artery are widely patent. RIGHT CAROTID SYSTEM: Common carotid artery is widely patent, medial course. Normal appearance of the carotid bifurcation without hemodynamically significant stenosis by NASCET criteria. Patent retropharyngeal internal carotid artery. LEFT CAROTID SYSTEM: Common carotid artery is widely patent, tortuous vessel. Trace calcific atherosclerosis carotid bifurcation without hemodynamically significant stenosis by NASCET criteria. Normal appearance of the internal carotid artery. VERTEBRAL ARTERIES:Left vertebral artery is dominant. Normal appearance of the vertebral arteries, widely patent. SKELETON: No acute osseous process though bone windows have not been submitted. Moderate to severe C6-7 degenerative disc. 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 OTHER NECK: Soft tissues of the neck are nonacute though, not tailored for evaluation. UPPER CHEST: Mild suspected vascular congestion. CTA HEAD FINDINGS: ANTERIOR CIRCULATION: Patent cervical internal carotid arteries, petrous, cavernous and supra clinoid  internal carotid arteries. Patent anterior communicating artery. 1 cm segment severe stenosis LEFT M1 origin. Moderate stenosis distal RIGHT M1 segment. No large vessel occlusion, significant stenosis, contrast extravasation or aneurysm. POSTERIOR CIRCULATION: Patent vertebral arteries, vertebrobasilar junction and basilar artery, as well as main branch vessels. Fenestrated proximal basilar artery. Patent posterior cerebral arteries, mild luminal irregularity compatible with atherosclerosis. No large vessel occlusion, significant stenosis, contrast extravasation or aneurysm. VENOUS SINUSES: Major dural venous sinuses are patent though not tailored for evaluation on this angiographic examination. ANATOMIC VARIANTS: None. DELAYED PHASE: No abnormal intracranial enhancement. 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 MIP images reviewed. CT Brain Perfusion Findings: CBF (<30%) Volume: 423mPerfusion (Tmax>6.0s) volume: FormL Mismatch Volume: 56m62mnfarction Location:Fixed perfusion defect LEFT parietoccipital junction. Prolonged T-max at 4 seconds though, normalized by 8 seconds. In addition, asymmetrically decreased LEFT basal ganglia blood flow with prolonged T-max. IMPRESSION: CTA NECK: 1. No hemodynamically significant stenosis or acute vascular process. 2. Incidental aberrant RIGHT subclavian artery. 3. **An incidental finding of potential clinical significance has been found. 3.7 cm ascending aorta. Recommend annual imaging followup by CTA or MRA. This recommendation follows 2010 ACCF/AHA/AATS/ACR/ASA/SCA/SCAI/SIR/STS/SVM Guidelines for the Diagnosis and Management of Patients with Thoracic Aortic Disease. Circulation.2010; 121: [id number] HEAD: 1. 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 2. No emergent large vessel occlusion. CT PERFUSION: 1. Fixed LEFT parietoccipital junction perfusion defect (posterior watershed territory). 2. LEFT basal ganglia perfusion  abnormality could reflect infarct or, penumbra and is not demonstrated on final map. 3. Prolonged LEFT MCA T-max on early phase, normalized by 8 seconds compatible with proximal stenosis. Electronically Signed   By: CouElon AlasD.   On: 04/06/2017 18:48   Ct Cerebral Perfusion W Contrast  Result Date: 04/06/2017 CLINICAL DATA:  RIGHT-sided deficits.  Follow-up stroke. EXAM: CT ANGIOGRAPHY HEAD AND NECK CT PERFUSION BRAIN TECHNIQUE: Multidetector CT imaging of the head and neck was performed using the standard protocol during bolus administration of intravenous contrast. Multiplanar CT image reconstructions and MIPs were obtained to evaluate the vascular anatomy. Carotid stenosis measurements (when applicable) are obtained utilizing NASCET criteria, using the distal internal carotid diameter as the denominator. Multiphase CT imaging of the brain was performed following IV bolus contrast injection. Subsequent parametric perfusion maps were calculated using RAPID software. CONTRAST:  1056m18mOVUE-370 IOPAMIDOL (  ISOVUE-370) INJECTION 76% COMPARISON:  CT HEAD April 06, 2017 at 1708 hours FINDINGS: CTA NECK FINDINGS: AORTIC ARCH: Ascending aorta is mildly enlarged at 3.7 cm. 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 Common origin of bilateral Common carotid artery's. The origins of the innominate, left Common carotid artery and subclavian artery are widely patent. RIGHT CAROTID SYSTEM: Common carotid artery is widely patent, medial course. Normal appearance of the carotid bifurcation without hemodynamically significant stenosis by NASCET criteria. Patent retropharyngeal internal carotid artery. LEFT CAROTID SYSTEM: Common carotid artery is widely patent, tortuous vessel. Trace calcific atherosclerosis carotid bifurcation without hemodynamically significant stenosis by NASCET criteria. Normal appearance of the internal carotid artery. VERTEBRAL ARTERIES:Left vertebral  artery is dominant. Normal appearance of the vertebral arteries, widely patent. SKELETON: No acute osseous process though bone windows have not been submitted. Moderate to severe C6-7 degenerative disc. 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 OTHER NECK: Soft tissues of the neck are nonacute though, not tailored for evaluation. UPPER CHEST: Mild suspected vascular congestion. CTA HEAD FINDINGS: ANTERIOR CIRCULATION: Patent cervical internal carotid arteries, petrous, cavernous and supra clinoid internal carotid arteries. Patent anterior communicating artery. 1 cm segment severe stenosis LEFT M1 origin. Moderate stenosis distal RIGHT M1 segment. No large vessel occlusion, significant stenosis, contrast extravasation or aneurysm. POSTERIOR CIRCULATION: Patent vertebral arteries, vertebrobasilar junction and basilar artery, as well as main branch vessels. Fenestrated proximal basilar artery. Patent posterior cerebral arteries, mild luminal irregularity compatible with atherosclerosis. No large vessel occlusion, significant stenosis, contrast extravasation or aneurysm. VENOUS SINUSES: Major dural venous sinuses are patent though not tailored for evaluation on this angiographic examination. ANATOMIC VARIANTS: None. DELAYED PHASE: No abnormal intracranial enhancement. 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 MIP images reviewed. CT Brain Perfusion Findings: CBF (<30%) Volume: 47m Perfusion (Tmax>6.0s) volume: FormL Mismatch Volume: 014mInfarction Location:Fixed perfusion defect LEFT parietoccipital junction. Prolonged T-max at 4 seconds though, normalized by 8 seconds. In addition, asymmetrically decreased LEFT basal ganglia blood flow with prolonged T-max. IMPRESSION: CTA NECK: 1. No hemodynamically significant stenosis or acute vascular process. 2. Incidental aberrant RIGHT subclavian artery. 3. **An incidental finding of potential clinical  significance has been found. 3.7 cm ascending aorta. Recommend annual imaging followup by CTA or MRA. This recommendation follows 2010 ACCF/AHA/AATS/ACR/ASA/SCA/SCAI/SIR/STS/SVM Guidelines for the Diagnosis and Management of Patients with Thoracic Aortic Disease. Circulation.2010; 121: [id number] CTA HEAD: 1. 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 2. No emergent large vessel occlusion. CT PERFUSION: 1. Fixed LEFT parietoccipital junction perfusion defect (posterior watershed territory). 2. LEFT basal ganglia perfusion abnormality could reflect infarct or, penumbra and is not demonstrated on final map. 3. Prolonged LEFT MCA T-max on early phase, normalized by 8 seconds compatible with proximal stenosis. Electronically Signed   By: CoElon Alas.D.   On: 04/06/2017 18:48   Ct Head Code Stroke Wo Contrast  Result Date: 04/06/2017 CLINICAL DATA:  Code stroke. RIGHT-sided deficits, altered mental status. EXAM: CT HEAD WITHOUT CONTRAST TECHNIQUE: Contiguous axial images were obtained from the base of the skull through the vertex without intravenous contrast. COMPARISON:  None. FINDINGS: BRAIN: No intraparenchymal hemorrhage, mass effect nor midline shift. The ventricles and sulci are normal for age. Confluent supratentorial white matter hypodensities. Loss of LEFT basal ganglia gray-white matter differentiation. Additional old bilateral basal ganglia lacunar infarcts. Old RIGHT and possibly LEFT thalamus lacunar infarcts. Known abnormal extra-axial fluid collections. Basal cisterns are patent. No abnormal extra-axial fluid collections. Basal cisterns are  patent. VASCULAR: Moderate calcific atherosclerosis of the carotid siphons. SKULL: No skull fracture. No significant scalp soft tissue swelling. SINUSES/ORBITS: Lobulated severe maxillary sinus opacification. Moderate remaining paranasal sinus lobulated mucosal thickening. Mastoid air cells are well aerated.The included ocular globes  and orbital contents are non-suspicious. OTHER: None. ASPECTS [facility] Stroke Program Early CT Score) - Ganglionic level infarction (caudate, lentiform nuclei, internal capsule, insula, M1-M3 cortex): 5 - Supraganglionic infarction (M4-M6 cortex): 3 Total score (0-10 with 10 being normal): 8 IMPRESSION: 1. Acute suspected LEFT basal ganglia nonhemorrhagic infarct (MCA territory versus perforated ir). 2. Moderate to severe chronic small vessel ischemic disease and old lacunar infarcts. 3. Critical Value/emergent results were called by telephone at the time of interpretation on 04/06/2017 at 5:25 pm to Dr. Nat Christen , who verbally acknowledged these results. 4. ASPECTS is 8. Electronically Signed   By: Elon Alas M.D.   On: 04/06/2017 17:31    Assessment: 67 y.o. female with acute left basal ganglia infarction and associated severe left M1 stenosis 1. Clinically worsened during transport with decreased ability to move left side and progression of speech deficit to mutism per family.  2. Possible progression of left M1 stenosis to complete occlusion after initial improvement following CTA 3. STAT repeat CT head shows no hemorrhagic conversion or significant mass effect.  4. Stroke Risk Factors - HTN  Plan: 1. STAT repeat CTA head/neck with CTP. Discussed risks/benefits of repeat contrast bolus with family. Overall, benefits regarding assessment for possible new occlusion, which could stratify her for thrombectomy, outweighs risks. Family expressed understanding and agreement with the plan.  2. Once stable, will obtain remainder of CVA workup, to include MRI of the brain without contrast and TTE.  3. When stable, obtain PT consult, OT consult, Speech consult 4. Admitted to Neuro ICU.  5. Post-tPA order set to include frequent neuro checks and BP management.  6. No antiplatelet medications or anticoagulants for at least 24 hours following tPA.  7. DVT prophylaxis with SCDs.  8. Will need to be  started on a statin.  9. Will need to start antiplatelet therapy if follow up CT at 24 hours is negative for hemorrhagic conversion. 10. Risk factor modification 11. Telemetry monitoring  60 minutes spent in the emergent Neurological evaluation and management of this critically ill acute stroke patient   Addendum 04/07/17, 1:18 AM: CTA shows no significant change per discussion with Radiology. Severe left M1 stenosis remains.   Electronically signed: Dr. Kerney Elbe 04/06/2017, 10:06 PM

## 2017-04-06 NOTE — ED Notes (Signed)
Neuro tele made order for TPA

## 2017-04-06 NOTE — Consult Note (Addendum)
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 CT showed only early ischemic change in LMCA territory manifest as loss of left caudate/BG hyperintensity - no infarct present. I reviewed the clinical situation with her husband at bedside.  I reviewed the risks/benefits/alternatives to tPA.  Husband consented to tPA administration. Given the cortical signs, LVO of proximal embolic source possible, so will get CTA/CTP.  UPDATE: CTA/CTP reviewed - severe proximal M1 stenosis, chronic appearing, without LVO.  Fixed/matched perfusion defect left parietal-occipital junction.  Increased transit time in BG, but no large cortical perfusion mismatch.   Patient would not be an intervention/thrombectomy candidate with these findings.    Differential Diagnosis:  1. Cardioembolic stroke  2. Small vessel disease/lacune  3. Thromboembolic, artery-to-artery mechanism  4. Hypercoagulable state-related infarct  5. Transient ischemic attack  6. Thrombotic mechanism, large artery disease   Comments:   Door time:  1712 [facility] contacted: [phone] [facility] at bedside: 1720 NIHSS assessment time: 1724 Verbal tpa order: 1728 Pre-tPA BP: 165/75 Finger stick glucose: 132 Needle time: 1751  Verbal Consent to tPA:  I have explained to the patient/family/guardian the nature of the patient's condition, the use of tPA fibrinolytic agent, and the benefits to be reasonably expected compared with alternative approaches. I have discussed the likelihood of major risks or complications of this procedure including (if applicable) but not limited to loss of limb function, brain damage, paralysis, hemorrhage, infection, complications from transfusion of blood components, drug reactions, blood clots and loss of life. I have also indicated that with any procedure there is always the possibility of an  unexpected complication. I have explained the risks which include:    1. Death, Stroke or permanent neurologic injury (paralysis, coma, etc)   2. Worsening of stroke symptoms from swelling or bleeding in the brain   3. Bleeding in other parts of the body   4. Need for blood transfusions to replace blood or clotting factors   5. Allergic reaction to medications   6. Other unexpected complications    All questions were answered and the patient/family/guardian express understanding of the treatment plan and consent to the procedure.  Our recommendations are outlined below.   We will be seeing the patient back in follow up as noted.    Recommendations:   IV tPA - dose = 7mg  bolus, 64mg  infusion  As pr ED dosing protocol (bed weight 78.3kg) Routine post tPA monitoring including neuro checks and blood pressure control during/after treatment Monitor blood pressure Check blood pressure and NIHSS every 15 min for 2 h, then every 30 min for 6 h, and finally every hour for 16 h  Systolic greater than 176 OR diastolic greater than 160: Option 1: Labetalol 10 mg IV for 1 - 2 min May repeat or double labetalol every 10 min to maximum dose of 300 mg, or give initial labetalol dose, then start labetalol drip at 2 - 8 mg/min. Option 2: Nicardipine 5 mg/h IV infusion as initial dose and titrate to desired effect by increasing 2.5 mg/h every 5 min to maximum of 15 mg/h;  If blood pressure is not controlled by labetolol or nicardipine, consider sodium nitroprusside.  CTA/CTP Admission to ICU CT brain 24 hours post tPA NPO until swallowing screen performed and passed No antiplatelet agents or anticoagulants (including heparin for DVT prophylaxis) in first 24 hours No Foley catheter, nasogastric tube, arterial catheter or central venous catheter for 24 hr,  unless absolutely necessary Telemetry  Inpatient Neurology Consultation Stroke evaluation as per inpatient neurology recommendations Discussed with  ED MD   ------------------------------------------------------------------------------  CC: stroke alert  History of Present Illness  Patient is a 67 year old woman with a history of HTN who presented with stroke symptoms.  Last seen normal by husband at 1400.  Then found by grandson in her bedroom unresponsive at 1530.   Eventually husband came home and activated EMS.  Patient was found to be gazing leftward on intake with right-sided weakness and difficulty producing speech.  No prior history of stroke.    Diagnostic CT head wo - loss of the left caudate and BG, but no infarct  Exam  NIHSS score: 9 1a LOC: 1 1b Questions: 2 1c Commands: 1 2 Gaze: 1 3 VF: 0  4 Face: 1 5a Motor arm left: 0  5b Motor arm right: 1 6a Motor leg left: 0  6b Motor leg right: 1 7 Ataxia: 0  8 Sensory: 0  9: Language: 1 10: Speech: 0  11: Extinction: 0    Medical Decision Making:  - Extensive number of diagnosis or management options are considered above.   - Extensive amount of complex data reviewed.   - High risk of complication and/or morbidity or mortality are associated with differential diagnostic considerations above.  - There may be Uncertain outcome and increased probability of prolonged functional impairment or high probability of severe prolonged functional impairment associated with some of these differential diagnosis.  Medical Data Reviewed:  1.Data reviewed include clinical labs, radiology,  Medical Tests;   2.Tests results discussed w/performing or interpreting physician;   3.Obtaining/reviewing old medical records;  4.Obtaining case history from another source;  5.Independent review of image, tracing or specimen.   Medical Decision Making:  - Extensive number of diagnosis or management options are considered above.   - Extensive amount of complex data reviewed.   - High risk of complication and/or morbidity or mortality are associated with differential diagnostic considerations  above.  - There may be Uncertain outcome and increased probability of prolonged functional impairment or high probability of severe prolonged functional impairment associated with some of these differential diagnosis.  Medical Data Reviewed:  1.Data reviewed include clinical labs, radiology,  Medical Tests;   2.Tests results discussed w/performing or interpreting physician;   3.Obtaining/reviewing old medical records;  4.Obtaining case history from another source;  5.Independent review of image, tracing or specimen.    Patient was informed the Neurology Consult would happen via telehealth (remote video) and consented to receiving care in this manner.

## 2017-04-06 NOTE — ED Notes (Signed)
Report called to [facility] hospital

## 2017-04-06 NOTE — ED Notes (Signed)
Pt in CT.

## 2017-04-07 ENCOUNTER — Inpatient Hospital Stay (HOSPITAL_COMMUNITY): Payer: Medicare Other

## 2017-04-07 ENCOUNTER — Encounter (HOSPITAL_COMMUNITY): Payer: Self-pay | Admitting: Radiology

## 2017-04-07 DIAGNOSIS — I63512 Cerebral infarction due to unspecified occlusion or stenosis of left middle cerebral artery: Secondary | ICD-10-CM

## 2017-04-07 LAB — RESPIRATORY PANEL BY PCR
ADENOVIRUS-RVPPCR: NOT DETECTED
Bordetella pertussis: NOT DETECTED
CORONAVIRUS NL63-RVPPCR: NOT DETECTED
Chlamydophila pneumoniae: NOT DETECTED
Coronavirus 229E: DETECTED — AB
Coronavirus HKU1: NOT DETECTED
Coronavirus OC43: NOT DETECTED
INFLUENZA A-RVPPCR: NOT DETECTED
INFLUENZA B-RVPPCR: NOT DETECTED
Metapneumovirus: NOT DETECTED
Mycoplasma pneumoniae: NOT DETECTED
PARAINFLUENZA VIRUS 3-RVPPCR: NOT DETECTED
PARAINFLUENZA VIRUS 4-RVPPCR: NOT DETECTED
Parainfluenza Virus 1: NOT DETECTED
Parainfluenza Virus 2: NOT DETECTED
RESPIRATORY SYNCYTIAL VIRUS-RVPPCR: NOT DETECTED
RHINOVIRUS / ENTEROVIRUS - RVPPCR: NOT DETECTED

## 2017-04-07 LAB — URINALYSIS, COMPLETE (UACMP) WITH MICROSCOPIC
BILIRUBIN URINE: NEGATIVE
GLUCOSE, UA: NEGATIVE mg/dL
Hgb urine dipstick: NEGATIVE
KETONES UR: 5 mg/dL — AB
LEUKOCYTES UA: NEGATIVE
Nitrite: NEGATIVE
PH: 5 (ref 5.0–8.0)
PROTEIN: NEGATIVE mg/dL
Specific Gravity, Urine: >1.046 — ABNORMAL HIGH (ref 1.005–1.030)

## 2017-04-07 LAB — HEMOGLOBIN A1C
Hgb A1c MFr Bld: 6.4 % — ABNORMAL HIGH (ref 4.8–5.6)
Mean Plasma Glucose: 136.98 mg/dL

## 2017-04-07 LAB — LIPID PANEL
CHOL/HDL RATIO: 5 ratio
Cholesterol: 159 mg/dL (ref 0–200)
HDL: 32 mg/dL — ABNORMAL LOW (ref >40)
LDL CALC: 107 mg/dL — AB (ref 0–99)
TRIGLYCERIDES: 98 mg/dL (ref <150)
VLDL: 20 mg/dL (ref 0–40)

## 2017-04-07 LAB — GLUCOSE, CAPILLARY
GLUCOSE-CAPILLARY: 151 mg/dL — AB (ref 65–99)
GLUCOSE-CAPILLARY: 137 mg/dL — AB (ref 65–99)
Glucose-Capillary: 137 mg/dL — ABNORMAL HIGH (ref 65–99)
Glucose-Capillary: 127 mg/dL — ABNORMAL HIGH (ref 65–99)
Glucose-Capillary: 148 mg/dL — ABNORMAL HIGH (ref 65–99)
Glucose-Capillary: 135 mg/dL — ABNORMAL HIGH (ref 65–99)

## 2017-04-07 LAB — MRSA PCR SCREENING: MRSA by PCR: NEGATIVE

## 2017-04-07 MED ORDER — IOPAMIDOL (ISOVUE-370) INJECTION 76%
INTRAVENOUS | Status: AC
  Administered 2017-04-07: 100 mL
  Filled 2017-04-07: qty 100

## 2017-04-07 MED ORDER — CHLORHEXIDINE GLUCONATE 0.12 % MT SOLN
15.0000 mL | Freq: Two times a day (BID) | OROMUCOSAL | Status: DC
  Administered 2017-04-07 – 2017-04-11 (×9): 15 mL via OROMUCOSAL
  Filled 2017-04-07 (×5): qty 15

## 2017-04-07 MED ORDER — ORAL CARE MOUTH RINSE
15.0000 mL | Freq: Two times a day (BID) | OROMUCOSAL | Status: DC
  Administered 2017-04-07 (×2): 15 mL via OROMUCOSAL

## 2017-04-07 MED ORDER — ASPIRIN 300 MG RE SUPP
300.0000 mg | Freq: Every day | RECTAL | Status: DC
  Administered 2017-04-07 – 2017-04-09 (×3): 300 mg via RECTAL
  Filled 2017-04-07 (×2): qty 1

## 2017-04-07 MED ORDER — ORAL CARE MOUTH RINSE
15.0000 mL | Freq: Two times a day (BID) | OROMUCOSAL | Status: DC
  Administered 2017-04-07 – 2017-04-11 (×8): 15 mL via OROMUCOSAL

## 2017-04-07 NOTE — Progress Notes (Signed)
Inpatient Rehabilitation  Per SLP request, patient was screened by Gunnar Fusi for appropriateness for an Inpatient Acute Rehab consult.  At this time will plan to follow along for PT/OT recommendations as well prior to requesting a consult.    Carmelia Roller., CCC/SLP Admission Coordinator  [place]  Cell [phone]

## 2017-04-07 NOTE — Evaluation (Signed)
Clinical/Bedside Swallow Evaluation Patient Details  Name: Ebony Williams MRN: [id number] Date of Birth: 11/13/1950  Today's Date: 04/07/2017 Time: SLP Start Time (ACUTE ONLY): 1110 SLP Stop Time (ACUTE ONLY): 1125 SLP Time Calculation (min) (ACUTE ONLY): 15 min  Past Medical History:  Past Medical History:  Diagnosis Date  . History of kidney stones    x1  . Hypertension    Past Surgical History:  Past Surgical History:  Procedure Laterality Date  . CHOLECYSTECTOMY     laparoscopic  . COLONOSCOPY WITH PROPOFOL N/A 01/12/2015   Procedure: COLONOSCOPY WITH PROPOFOL;  Surgeon: Garlan Fair, MD;  Location: [facility];  Service: Endoscopy;  Laterality: N/A;  . TUBAL LIGATION     HPI:  Ebony R Springsis an 67 y.o.with right sided weakness, right facial droop and AMS.CT Acute suspected LEFT basal ganglia nonhemorrhagic infarct, moderate to severe chronic small vessel ischemic disease and old lacunar infarcts. CXR mild hazy left basilar airspace disease which may reflect atelectasis versus pneumonia.   Assessment / Plan / Recommendation Clinical Impression  Suspect laryngeal penetration at minimal with immediate throat clear following water. Exhibits oral apraxia and possibly to some extent verbal (will assess language abilities further). Transient vocalizations with low vocal intensity. Po's are not recommended at present except for ice chips if desired following oral care. XRAY unable to accommodate MBS today; will plan for tomorrow.    SLP Visit Diagnosis: Dysphagia, unspecified (R13.10)    Aspiration Risk  Moderate aspiration risk    Diet Recommendation NPO;Ice chips PRN after oral care   Medication Administration: Via alternative means    Other  Recommendations Oral Care Recommendations: Oral care QID   Follow up Recommendations Inpatient Rehab      Frequency and Duration min 2x/week  2 weeks       Prognosis        Swallow Study   General HPI: Ebony Tumlin  Springsis an 67 y.o.with right sided weakness, right facial droop and AMS.CT Acute suspected LEFT basal ganglia nonhemorrhagic infarct, moderate to severe chronic small vessel ischemic disease and old lacunar infarcts. CXR mild hazy left basilar airspace disease which may reflect atelectasis versus pneumonia. Type of Study: Bedside Swallow Evaluation Previous Swallow Assessment: none Diet Prior to this Study: NPO Temperature Spikes Noted: No Respiratory Status: Room air History of Recent Intubation: No Behavior/Cognition: Alert;Cooperative;Pleasant mood;Requires cueing Oral Cavity Assessment: Within Functional Limits Oral Care Completed by SLP: Yes Oral Cavity - Dentition: Adequate natural dentition Vision: (right inattention) Self-Feeding Abilities: Needs assist Patient Positioning: Upright in bed Baseline Vocal Quality: Low vocal intensity Volitional Cough: Other (Comment)(unable- apraxic) Volitional Swallow: Unable to elicit    Oral/Motor/Sensory Function Overall Oral Motor/Sensory Function: Other (comment)(unable to perform- oral apraxia)   Ice Chips Ice chips: Not tested   Thin Liquid Thin Liquid: Impaired Presentation: Cup Pharyngeal  Phase Impairments: Throat Clearing - Immediate    Nectar Thick Nectar Thick Liquid: Not tested   Honey Thick Honey Thick Liquid: Not tested   Puree Puree: Within functional limits   Solid   GO   Solid: Not tested        Ebony Williams 04/07/2017,11:42 AM   Ebony Williams.Ed [employer] [phone]

## 2017-04-07 NOTE — Progress Notes (Signed)
OT Cancellation    04/07/17 0700  OT Visit Information  Last OT Received On 04/07/17  Reason Eval/Treat Not Completed Patient not medically ready. Pt with active bedrest orders. Will await increase in activity orders prior to initiating OT evaluation. Thank you!   [place], OTR/L Acute Rehab Pager: [phone] Office: [phone]

## 2017-04-07 NOTE — Progress Notes (Signed)
PT Cancellation Note  Patient Details Name: Ebony Williams MRN: [id number] DOB: December 23, 1950   Cancelled Treatment:    Reason Eval/Treat Not Completed: Active bedrest order. Pt remains with "strict bedrest" orders in place. PT will continue to f/u with pt as available and await updated activity orders.   [place] 04/07/2017, 12:32 PM

## 2017-04-07 NOTE — Progress Notes (Signed)
PT Cancellation Note  Patient Details Name: Ebony Williams MRN: [id number] DOB: 1950-09-01   Cancelled Treatment:    Reason Eval/Treat Not Completed: Active bedrest order. Pt currently with "strict bedrest" orders in place. PT will continue to f/u with pt and await updated activity orders prior to initiating PT evaluation.    [place] 04/07/2017, 9:37 AM

## 2017-04-07 NOTE — Progress Notes (Signed)
STROKE TEAM PROGRESS NOTE   HISTORY OF PRESENT ILLNESS (per record) Ebony Williams is an 67 y.o. female who was initially evaluated at the [facility] ED after presenting there early this evening with acute onset 2 hours previously of right sided weakness, right facial droop and AMS. She was last seen normal by husband at 21, then found by grandson in her bedroom unresponsive at 1530. EMS was called and on their arrival, the patient was found to be gazing leftward with right-sided weakness and difficulty producing speech.   CT head at OSH revealed a  subacute left sided basal ganglia nonhemorrhagic infarct. 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 There were no contraindications to tPA;husband consented to tPA administration. LVO or proximal embolic source were possible and a CTA/CTP was obtained, which showed chronic appearing severe proximal left M1 stenosis, without LVO. There was a fixed/matched perfusion defect at the left parietal-occipital junction with increased transit time in BG, but no large cortical perfusion mismatch. The patient was determined by Teleneurology consultant to not be an intervention/thrombectomy candidate with these findings.  After tPA was administered, per EDP at OSH, the patient appeared to have a positive response.     SUBJECTIVE (INTERVAL HISTORY) The patient's daughter was at the bedside. The patient is aphasic. The daughter reports that her mother improved somewhat after the TPA however she is worse this morning. The patient is awake but not following commands. She withdraws her right side to painful stimuli.    OBJECTIVE Temp:  [98.2 F (36.8 C)-98.8 F (37.1 C)] 98.2 F (36.8 C) (03/02 0800) Pulse Rate:  [63-108] 67 (03/02 0800) Resp:  [18-33] 22 (03/02 0800) BP: (98-192)/(65-90) 156/70 (03/02 0800) SpO2:  [91 %-98 %] 93 % (03/02 0800) FiO2 (%):  [21 %] 21 % (03/01 2215) Weight:   [164 lb 3.9 oz (74.5 kg)-175 lb (79.4 kg)] 164 lb 3.9 oz (74.5 kg) (03/01 2200)  CBC:  Recent Labs  Lab 04/06/17 1714 04/06/17 1739  WBC 11.5*  --   NEUTROABS 9.3*  --   HGB 15.9* 15.6*  HCT 47.4* 46.0  MCV 90.8  --   PLT 236  --     Basic Metabolic Panel:  Recent Labs  Lab 04/06/17 1714 04/06/17 1739  NA 138 139  K 3.6 3.7  CL 102 102  CO2 25  --   GLUCOSE 143* 141*  BUN 12 11  CREATININE 0.82 0.80  CALCIUM 9.4  --     Lipid Panel:     Component Value Date/Time   CHOL 159 04/07/2017 0226   TRIG 98 04/07/2017 0226   HDL 32 (L) 04/07/2017 0226   CHOLHDL 5.0 04/07/2017 0226   VLDL 20 04/07/2017 0226   LDLCALC 107 (H) 04/07/2017 0226   HgbA1c:  Lab Results  Component Value Date   HGBA1C 6.4 (H) 04/07/2017   Urine Drug Screen: No results found for: LABOPIA, COCAINSCRNUR, LABBENZ, AMPHETMU, THCU, LABBARB  Alcohol Level     Component Value Date/Time   ETH <10 04/06/2017 1714    IMAGING  Ct Angio Head W Or Wo Contrast 04/07/2017 IMPRESSION:  1. Unchanged CTA examination without emergent large vessel occlusion. No intracranial hemorrhage.  2. Moderate proximal left MCA M1 segment stenosis with mild elevation of Tmax in the left MCA/PCA watershed area. This is also unchanged and likely due to the proximal M1 stenosis.  3. No acute infarct by CBF criteria. The previously seen 4 mL fixed  perfusion defect in the left posterior watershed territory is no longer present. This may be due to residual intravascular enhancement from the earlier study.  4. Unchanged left basal ganglia perfusion abnormality, which remains indeterminate and is not demonstrated on the final Tmax or CBF maps.     Ct Angio Head W Or Wo Contrast Ct Angio Neck W Or Wo Contrast Ct Cerebral Perfusion W Contrast 04/06/2017 IMPRESSION:   CTA NECK:  1. No hemodynamically significant stenosis or acute vascular process.  2. Incidental aberrant RIGHT subclavian artery.  3. **An incidental finding of  potential clinical significance has been found. 3.7 cm ascending aorta. Recommend annual imaging followup by CTA or MRA. This recommendation follows 2010 ACCF/AHA/AATS/ACR/ASA/SCA/SCAI/SIR/STS/SVM Guidelines for the Diagnosis and Management of Patients with Thoracic Aortic Disease. Circulation.2010; 121: Q683-M196**   CTA HEAD:  1. 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  2. No emergent large vessel occlusion.   CT PERFUSION:  1. Fixed LEFT parietoccipital junction perfusion defect (posterior watershed territory).  2. LEFT basal ganglia perfusion abnormality could reflect infarct or, penumbra and is not demonstrated on final map.  3. Prolonged LEFT MCA T-max on early phase, normalized by 8 seconds compatible with proximal stenosis.   Ct Head Wo Contrast 04/06/2017 IMPRESSION:  No acute abnormality. Expected retained intravascular contrast from earlier CTA study.     Dg Chest Port 1 View 04/07/2017 IMPRESSION:  Mild hazy left basilar airspace disease which may reflect atelectasis versus pneumonia.   Ct Head Code Stroke Wo Contrast 04/06/2017 IMPRESSION:  1. Acute suspected LEFT basal ganglia nonhemorrhagic infarct (MCA territory versus perforated ir).  2. Moderate to severe chronic small vessel ischemic disease and old lacunar infarcts.     Transthoracic Echocardiogram - pending 00/00/00     PHYSICAL EXAM Vitals:   04/07/17 0500 04/07/17 0600 04/07/17 0700 04/07/17 0800  BP: (!) 149/72 (!) 159/67 (!) 151/68 (!) 156/70  Pulse: 64 65 68 67  Resp: 20 (!) 22 19 (!) 22  Temp:    98.2 F (36.8 C)  TempSrc:    Oral  SpO2: 98% 94% 98% 93%  Weight:      Height:       Pleasant middle-aged Caucasian lady who is not in distress. . Afebrile. Head is nontraumatic. Neck is supple without bruit.    Cardiac exam no murmur or gallop. Lungs are clear to auscultation. Distal pulses are well felt.  Neurological Exam :  Awake alert globally aphasic.  Mute.  Will not  follow simple commands including midline commands.  Left gaze preference but able to look to the right past midline.  Blinks to threat on the left but not the right.  Right lower facial weakness.  Tongue midline.  Dense right hemiplegia with only trace withdrawal to painful stimuli in the right leg and right upper extremity.  Purposeful antigravity movements on the left.  Sensation likely diminished on the right.  Gait not tested.   ASSESSMENT/PLAN Ebony Williams is a 67 y.o. female with history of hypertension presenting with right-sided weakness, right facial droop, altered mental status, decreased level of responsiveness, beech difficulties, and leftward gaze. She received IV [facility]PA at [facility] on Friday 04/06/2017.  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  MRI head - pending  MRA head - not performed  CTA H&N -  Severe stenosis LEFT M1 origin,  possible chronic thromboembolism.   Carotid Doppler - CTA neck  2D Echo - pending  LDL - 107  HgbA1c - 6.4  VTE prophylaxis - SCDs Diet NPO time specified Check puncture sites for bleeding or hematomas. Bleeding precautions Fall precautions  No antithrombotic prior to admission, now on aspirin post TPA.  Patient will be counseled to be compliant with her antithrombotic medications  Ongoing aggressive stroke risk factor management  Therapy recommendations:  pending  Disposition:  Pending  Hypertension  Stable  Permissive hypertension (OK if < 220/120) but gradually normalize in 5-7 days  Long-term BP goal normotensive  Hyperlipidemia  Home meds: No lipid lowering medications prior to admission.  LDL 107, goal < 70  Add Lipitor when PO access is available.  Continue statin at discharge    Other Stroke Risk Factors  Advanced age  ETOH use, advised to drink no  more than 1 drink per day.  Obesity, Body mass index is 31.03 kg/m., recommend weight loss, diet and exercise as appropriate   Hx stroke/TIA - by imaging   Other Active Problems  3.7 cm ascending aorta. Recommend annual imaging followup by CTA or MRA.  NPO - tube feedings to be discussed.   Plan / Recommendations   Stroke workup - MRI and echo pending  Start aspirin if follow-up imaging is negative for hemorrhage.   Hospital day # 1  Mikey Bussing PA-C [facility] Pager [phone] 04/07/2017, 4:10 PM  I have personally examined this patient, reviewed notes, independently viewed imaging studies, participated in medical decision making and plan of care.ROS completed by me personally and pertinent positives fully documented  I have made any additions or clarifications directly to the above note. Agree with note above.She presented with aphasia and right hemiplegia  due to left hemispheric infarct from left MCA stenosis.  She received IV TPA but after showing some initial improvement has shown some worsening.  Stat CT scan of the head was obtained this morning which shows no post TPA hemorrhage but shows evolving left basal ganglia and left temporal MCA infarct.  She needs close neurological monitoring and strict blood pressure control as per post TPA protocol.  Check MRI scan of the later today, echocardiogram.  Start aspirin.  Long discussion with the patient's daughter, husband and multiple family members at the bedside and answered questions about her care. This patient is critically ill and at significant risk of neurological worsening, death and care requires constant monitoring of vital signs, hemodynamics,respiratory and cardiac monitoring, extensive review of multiple databases, frequent neurological assessment, discussion with family, other specialists and medical decision making of high complexity.I have made any additions or clarifications directly to the above  note.This critical care time does not reflect procedure time, or teaching time or supervisory time of PA/NP/Med Resident etc but could involve care discussion time.  I spent 50 minutes of neurocritical care time  in the care of  this patient.      Antony Contras, MD Medical Director [facility] Pager: [phone] 04/07/2017 5:31 PM   To contact Stroke Continuity provider, please refer to [email or web address] After hours, contact General Neurology

## 2017-04-08 ENCOUNTER — Inpatient Hospital Stay (HOSPITAL_COMMUNITY): Payer: Medicare Other

## 2017-04-08 ENCOUNTER — Other Ambulatory Visit: Payer: Self-pay

## 2017-04-08 DIAGNOSIS — I351 Nonrheumatic aortic (valve) insufficiency: Secondary | ICD-10-CM

## 2017-04-08 LAB — URINE CULTURE

## 2017-04-08 LAB — GLUCOSE, CAPILLARY
GLUCOSE-CAPILLARY: 127 mg/dL — AB (ref 65–99)
Glucose-Capillary: 137 mg/dL — ABNORMAL HIGH (ref 65–99)
Glucose-Capillary: 118 mg/dL — ABNORMAL HIGH (ref 65–99)
Glucose-Capillary: 126 mg/dL — ABNORMAL HIGH (ref 65–99)
Glucose-Capillary: 133 mg/dL — ABNORMAL HIGH (ref 65–99)

## 2017-04-08 MED ORDER — HYDRALAZINE HCL 20 MG/ML IJ SOLN
5.0000 mg | INTRAMUSCULAR | Status: DC | PRN
  Administered 2017-04-08: 5 mg via INTRAVENOUS
  Filled 2017-04-08 (×2): qty 1

## 2017-04-08 MED ORDER — HYDRALAZINE HCL 20 MG/ML IJ SOLN
20.0000 mg | INTRAMUSCULAR | Status: DC | PRN
  Administered 2017-04-08 – 2017-04-09 (×2): 20 mg via INTRAVENOUS
  Filled 2017-04-08: qty 1

## 2017-04-08 MED ORDER — LABETALOL HCL 5 MG/ML IV SOLN
20.0000 mg | INTRAVENOUS | Status: DC | PRN
  Administered 2017-04-10 – 2017-04-11 (×4): 20 mg via INTRAVENOUS
  Filled 2017-04-08 (×4): qty 4

## 2017-04-08 MED ORDER — HYDRALAZINE HCL 20 MG/ML IJ SOLN: 20.0000 mg | INTRAMUSCULAR | Status: DC | PRN

## 2017-04-08 MED ORDER — LABETALOL HCL 5 MG/ML IV SOLN
10.0000 mg | INTRAVENOUS | Status: DC | PRN
  Administered 2017-04-08 (×2): 10 mg via INTRAVENOUS
  Filled 2017-04-08 (×2): qty 4

## 2017-04-08 NOTE — Progress Notes (Signed)
PT Cancellation Note  Patient Details Name: Ebony Williams MRN: [id number] DOB: [birth year]   Cancelled Treatment:    Reason Eval/Treat Not Completed: Fatigue/lethargy limiting ability to participate. Discussed pt case with RN who asks PT/OT to hold this date. RN reports that pt is minimally responsive and not following commands at this time. Will check back tomorrow to assess appropriateness to initiate therapy evaluations.   Thelma Comp 04/08/2017, 11:25 AM   Rolinda Roan, PT, DPT Acute Rehabilitation Services Pager: [phone]

## 2017-04-08 NOTE — Progress Notes (Signed)
OT Cancellation    04/08/17 1100  OT Visit Information  Last OT Received On 04/08/17  Reason Eval/Treat Not Completed Patient not medically ready. RN reports that pt lethargic, decreased following commands, and poor arousal. Request for OT evaluation tomorrow. Will return as schedule allows. Thank you.   Saxon, OTR/L Acute Rehab Pager: [phone] Office: [phone]

## 2017-04-08 NOTE — Plan of Care (Signed)
  Nutrition: Dietary intake will improve 04/08/2017 1229 - Not Progressing by Vena Austria., RN  Cortak ordered to be placed

## 2017-04-08 NOTE — Progress Notes (Signed)
  Echocardiogram 2D Echocardiogram has been performed.  Ebony Williams T Angelly Spearing 04/08/2017, 6:21 PM

## 2017-04-08 NOTE — Progress Notes (Signed)
SLP Cancellation Note  Patient Details Name: KAMISHA ELL MRN: [id number] DOB: September 11, 1950   Cancelled treatment:       Reason Eval/Treat Not Completed: Fatigue/lethargy limiting ability to participate. Spoke with RN, will defer MBS to next date.  Deneise Lever, [place], [place] Speech-Language Pathologist [phone]   Aliene Altes 04/08/2017, 11:09 AM

## 2017-04-08 NOTE — Progress Notes (Signed)
STROKE TEAM PROGRESS NOTE   HISTORY OF PRESENT ILLNESS (per record) Ebony Williams is an 67 y.o. female who was initially evaluated at the [facility] after presenting there early this evening with acute onset 2 hours previously of right sided weakness, right facial droop and AMS. She was last seen normal by husband at 87, then found by grandson in her bedroom unresponsive at 1530. EMS was called and on their arrival, the patient was found to be gazing leftward with right-sided weakness and difficulty producing speech.   CT head at OSH revealed a  subacute left sided basal ganglia nonhemorrhagic infarct. 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 There were no contraindications to tPA;husband consented to tPA administration. LVO or proximal embolic source were possible and a CTA/CTP was obtained, which showed chronic appearing severe proximal left M1 stenosis, without LVO. There was a fixed/matched perfusion defect at the left parietal-occipital junction with increased transit time in BG, but no large cortical perfusion mismatch. The patient was determined by Teleneurology consultant to not be an intervention/thrombectomy candidate with these findings.  After tPA was administered, per EDP at OSH, the patient appeared to have a positive response.     SUBJECTIVE (INTERVAL HISTORY) The patient's daughter, husband and multiple family members were at the bedside. The patient is aphasic.   awake but not following commands. She withdraws her right side to painful stimuli. MRI scan on the brain done yesterday shows a moderate-sized left basal ganglia and left temporoparietal patchy infarct. Minimal petechial hemorrhagic transformation    OBJECTIVE Temp:  [98.4 F (36.9 C)-98.9 F (37.2 C)] 98.4 F (36.9 C) (03/03 0358) Pulse Rate:  [61-95] 78 (03/03 1230) Cardiac Rhythm: Normal sinus rhythm (03/03 0800) Resp:  [16-33] 20 (03/03  1230) BP: (146-195)/(67-99) 182/81 (03/03 1230) SpO2:  [90 %-100 %] 98 % (03/03 1230)  CBC:  Recent Labs  Lab 04/06/17 1714 04/06/17 1739  WBC 11.5*  --   NEUTROABS 9.3*  --   HGB 15.9* 15.6*  HCT 47.4* 46.0  MCV 90.8  --   PLT 236  --     Basic Metabolic Panel:  Recent Labs  Lab 04/06/17 1714 04/06/17 1739  NA 138 139  K 3.6 3.7  CL 102 102  CO2 25  --   GLUCOSE 143* 141*  BUN 12 11  CREATININE 0.82 0.80  CALCIUM 9.4  --     Lipid Panel:     Component Value Date/Time   CHOL 159 04/07/2017 0226   TRIG 98 04/07/2017 0226   HDL 32 (L) 04/07/2017 0226   CHOLHDL 5.0 04/07/2017 0226   VLDL 20 04/07/2017 0226   LDLCALC 107 (H) 04/07/2017 0226   HgbA1c:  Lab Results  Component Value Date   HGBA1C 6.4 (H) 04/07/2017   Urine Drug Screen: No results found for: LABOPIA, COCAINSCRNUR, LABBENZ, AMPHETMU, THCU, LABBARB  Alcohol Level     Component Value Date/Time   ETH <10 04/06/2017 1714    IMAGING  Ct Angio Head W Or Wo Contrast 04/07/2017 IMPRESSION:  1. Unchanged CTA examination without emergent large vessel occlusion. No intracranial hemorrhage.  2. Moderate proximal left MCA M1 segment stenosis with mild elevation of Tmax in the left MCA/PCA watershed area. This is also unchanged and likely due to the proximal M1 stenosis.  3. No acute infarct by CBF criteria. The previously seen 4 mL fixed perfusion defect in the left posterior watershed territory is no longer present.  This may be due to residual intravascular enhancement from the earlier study.  4. Unchanged left basal ganglia perfusion abnormality, which remains indeterminate and is not demonstrated on the final Tmax or CBF maps.     Ct Angio Head W Or Wo Contrast Ct Angio Neck W Or Wo Contrast Ct Cerebral Perfusion W Contrast 04/06/2017 IMPRESSION:   CTA NECK:  1. No hemodynamically significant stenosis or acute vascular process.  2. Incidental aberrant RIGHT subclavian artery.  3. **An incidental  finding of potential clinical significance has been found. 3.7 cm ascending aorta. Recommend annual imaging followup by CTA or MRA. This recommendation follows 2010 ACCF/AHA/AATS/ACR/ASA/SCA/SCAI/SIR/STS/SVM Guidelines for the Diagnosis and Management of Patients with Thoracic Aortic Disease. Circulation.2010; 121: K440-N027**   CTA HEAD:  1. 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  2. No emergent large vessel occlusion.   CT PERFUSION:  1. Fixed LEFT parietoccipital junction perfusion defect (posterior watershed territory).  2. LEFT basal ganglia perfusion abnormality could reflect infarct or, penumbra and is not demonstrated on final map.  3. Prolonged LEFT MCA T-max on early phase, normalized by 8 seconds compatible with proximal stenosis.   Ct Head Wo Contrast 04/06/2017 IMPRESSION:  No acute abnormality. Expected retained intravascular contrast from earlier CTA study.     Dg Chest Port 1 View 04/07/2017 IMPRESSION:  Mild hazy left basilar airspace disease which may reflect atelectasis versus pneumonia.   Ct Head Code Stroke Wo Contrast 04/06/2017 IMPRESSION:  1. Acute suspected LEFT basal ganglia nonhemorrhagic infarct (MCA territory versus perforated ir).  2. Moderate to severe chronic small vessel ischemic disease and old lacunar infarcts.     Transthoracic Echocardiogram - pending 00/00/00     PHYSICAL EXAM Vitals:   04/08/17 1145 04/08/17 1200 04/08/17 1215 04/08/17 1230  BP: (!) 177/85 (!) 183/89 (!) 169/73 (!) 182/81  Pulse: 70 64 64 78  Resp: 20 16 (!) 21 20  Temp:      TempSrc:      SpO2: 98% 99% 97% 98%  Weight:      Height:       Pleasant middle-aged Caucasian lady who is not in distress. . Afebrile. Head is nontraumatic. Neck is supple without bruit.    Cardiac exam no murmur or gallop. Lungs are clear to auscultation. Distal pulses are well felt.  Neurological Exam :  Awake alert globally aphasic.  Mute.  Will not follow simple  commands including midline commands.  Left gaze preference but able to look to the right past midline.  Blinks to threat on the left but not the right.  Right lower facial weakness.  Tongue midline.  Dense right hemiplegia with only trace withdrawal to painful stimuli in the right leg and right upper extremity.  Purposeful antigravity movements on the left.  Sensation likely diminished on the right.  Gait not tested.   ASSESSMENT/PLAN Ebony Williams is a 67 y.o. female with history of hypertension presenting with right-sided weakness, right facial droop, altered mental status, decreased level of responsiveness, beech difficulties, and leftward gaze. She received IV TPA at [facility] on Friday 04/06/2017.  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 MRI head -  Acute multifocal ischemic left MCA and left MCA/PCA border zone infarcts involving the left cerebral hemisphere as above. Some of these infarcts are positioned in a somewhat watershed fashion.Associated faint petechial hemorrhage without  hemorrhagic transformation or mass effect.  MRA head - not performed  CTA H&N -  Severe stenosis LEFT M1 origin, possible chronic thromboembolism.   Carotid Doppler - CTA neck  2D Echo - pending  LDL - 107  HgbA1c - 6.4  VTE prophylaxis - SCDs Diet NPO time specified Check puncture sites for bleeding or hematomas. Bleeding precautions Fall precautions  No antithrombotic prior to admission, now on aspirin post TPA.  Patient will be counseled to be compliant with her antithrombotic medications  Ongoing aggressive stroke risk factor management  Therapy recommendations:  pending  Disposition:  Pending  Hypertension  Stable  Permissive hypertension (OK if < 220/120) but gradually normalize in 5-7 days  Long-term BP goal  normotensive  Hyperlipidemia  Home meds: No lipid lowering medications prior to admission.  LDL 107, goal < 70  Add Lipitor when PO access is available.  Continue statin at discharge    Other Stroke Risk Factors  Advanced age  ETOH use, advised to drink no more than 1 drink per day.  Obesity, Body mass index is 31.03 kg/m., recommend weight loss, diet and exercise as appropriate   Hx stroke/TIA - by imaging   Other Active Problems  3.7 cm ascending aorta. Recommend annual imaging followup by CTA or MRA.  NPO - tube feedings to be discussed.   Plan / Recommendations   Stroke workup - echo pending Started aspirin . Panda tube for feeding and medications. Long discussion with husband and daughter and multiple family members about prognosis and answered questions. Mobilize out of bed. Therapy consults.  Hospital day # 2     I have personally examined this patient, reviewed notes, independently viewed imaging studies, participated in medical decision making and plan of care.ROS completed by me personally and pertinent positives fully documented  I have made any additions or clarifications directly to the above note. Agree with note above.She presented with aphasia and right hemiplegia  due to left hemispheric infarct from left MCA stenosis.  She received IV TPA but after showing some initial improvement has shown some worsening.  Stat CT scan of the head was obtained this morning which shows no post TPA hemorrhage but shows evolving left basal ganglia and left temporal MCA infarct.  She needs close neurological monitoring and strict blood pressure control as per post TPA protocol.  Check MRI scan of the later today, echocardiogram.  Start aspirin.  Long discussion with the patient's daughter, husband and multiple family members at the bedside and answered questions about her care. This patient is critically ill and at significant risk of neurological worsening, death and care  requires constant monitoring of vital signs, hemodynamics,respiratory and cardiac monitoring, extensive review of multiple databases, frequent neurological assessment, discussion with family, other specialists and medical decision making of high complexity.I have made any additions or clarifications directly to the above note.This critical care time does not reflect procedure time, or teaching time or supervisory time of PA/NP/Med Resident etc but could involve care discussion time.  I spent 40 minutes of neurocritical care time  in the care of  this patient.      Antony Contras, MD Medical Director [place] Pager: [phone] 04/08/2017 12:53 PM   To contact Stroke Continuity provider, please refer to [email or web address] After hours, contact General Neurology

## 2017-04-09 ENCOUNTER — Inpatient Hospital Stay (HOSPITAL_COMMUNITY): Payer: Medicare Other

## 2017-04-09 DIAGNOSIS — I6300 Cerebral infarction due to thrombosis of unspecified precerebral artery: Secondary | ICD-10-CM

## 2017-04-09 DIAGNOSIS — D72829 Elevated white blood cell count, unspecified: Secondary | ICD-10-CM

## 2017-04-09 DIAGNOSIS — R7303 Prediabetes: Secondary | ICD-10-CM

## 2017-04-09 DIAGNOSIS — R0682 Tachypnea, not elsewhere classified: Secondary | ICD-10-CM

## 2017-04-09 DIAGNOSIS — I69391 Dysphagia following cerebral infarction: Secondary | ICD-10-CM

## 2017-04-09 DIAGNOSIS — I10 Essential (primary) hypertension: Secondary | ICD-10-CM

## 2017-04-09 DIAGNOSIS — I519 Heart disease, unspecified: Secondary | ICD-10-CM

## 2017-04-09 DIAGNOSIS — I5189 Other ill-defined heart diseases: Secondary | ICD-10-CM

## 2017-04-09 LAB — ECHOCARDIOGRAM COMPLETE
HEIGHTINCHES: 61 in
WEIGHTICAEL: 2627.88 [oz_av]

## 2017-04-09 LAB — GLUCOSE, CAPILLARY: GLUCOSE-CAPILLARY: 129 mg/dL — AB (ref 65–99)

## 2017-04-09 MED ORDER — RESOURCE THICKENUP CLEAR PO POWD
ORAL | Status: DC | PRN
  Filled 2017-04-09: qty 125

## 2017-04-09 MED ORDER — ASPIRIN EC 81 MG PO TBEC
81.0000 mg | DELAYED_RELEASE_TABLET | Freq: Every day | ORAL | Status: DC
  Administered 2017-04-09 – 2017-04-11 (×3): 81 mg via ORAL
  Filled 2017-04-09 (×3): qty 1

## 2017-04-09 MED ORDER — CLOPIDOGREL BISULFATE 75 MG PO TABS
75.0000 mg | ORAL_TABLET | Freq: Every day | ORAL | Status: DC
Start: 2017-04-09 — End: 2017-04-11
  Administered 2017-04-09 – 2017-04-11 (×3): 75 mg via ORAL
  Filled 2017-04-09 (×3): qty 1

## 2017-04-09 NOTE — Consult Note (Signed)
Physical Medicine and Rehabilitation Consult Reason for Consult: Right side weakness Referring Physician: Dr. Leonie Man   HPI: Ebony Williams is a 67 y.o. right handed female with history of hypertension. Per chart review and husband, patient lives with spouse. Reported to be independent prior to admission. 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 One level home with 3 steps to entry. Family in the area question 24 hour assistance. Presented 04/06/2017 with right sided weakness, left gaze and aphasia. CT head reviewed, showing left basal ganglia infarct.  Per report, subacute left-sided basal ganglia nonhemorrhagic infarction. Patient did receive TPA. CT angiogram head and neck with no hemodynamically significant stenosis or acute vascular process. There was noted left M1 stenosis. Felt to be surgical candidate. MRI showed acute multifocal ischemic left MCA and left MCA/PCA infarcts. Echocardiogram with ejection fraction of 78% grade 1 diastolic dysfunction. Neurology consulted presently on aspirin for CVA prophylaxis. Dysphagia #1 nectar thick liquid diet. Physical occupational therapy evaluations completed 04/09/2017 with recommendations of physical medicine rehabilitation consult.  Review of Systems  Unable to perform ROS: Medical condition   Past Medical History:  Diagnosis Date  . History of kidney stones    x1  . Hypertension    Past Surgical History:  Procedure Laterality Date  . CHOLECYSTECTOMY     laparoscopic  . COLONOSCOPY WITH PROPOFOL N/A 01/12/2015   Procedure: COLONOSCOPY WITH PROPOFOL;  Surgeon: Garlan Fair, MD;  Location: [facility];  Service: Endoscopy;  Laterality: N/A;  . TUBAL LIGATION     Family History  Problem Relation Age of Onset  . Diabetes Mother   . Hypertension Mother    Social History:  reports that  has never smoked. she has never used smokeless tobacco. She reports that she drinks alcohol. She reports that she does not use drugs. Allergies:    Allergies  Allergen Reactions  . Sulfa Antibiotics Diarrhea and Itching   Medications Prior to Admission  Medication Sig Dispense Refill  . ALPRAZolam (XANAX) 0.25 MG tablet Take 0.25 mg by mouth at bedtime as needed for anxiety or sleep.     [name] ibuprofen (ADVIL,MOTRIN) 200 MG tablet Take 200-600 mg by mouth every 6 (six) hours as needed for mild pain.     . valsartan-hydrochlorothiazide (DIOVAN-HCT) 320-12.5 MG tablet Take 1 tablet by mouth daily.    [name] venlafaxine XR (EFFEXOR-XR) 150 MG 24 hr capsule Take 150 mg by mouth daily with breakfast.    . zolpidem (AMBIEN) 10 MG tablet Take 10 mg by mouth at bedtime as needed for sleep.     [name] ALPRAZolam (XANAX) 0.5 MG tablet Take 0.25-0.5 mg by mouth every 4 (four) hours as needed. Insomnia or anxiety    . venlafaxine XR (EFFEXOR-XR) 75 MG 24 hr capsule Take 75 mg by mouth daily.      Home: Home Living Family/patient expects to be discharged to:: Private residence Living Arrangements: Spouse/significant other Available Help at Discharge: Family, Available 24 hours/day Type of Home: House Home Access: Stairs to enter Technical brewer of Steps: 3 Home Layout: One level Bathroom Shower/Tub: Multimedia programmer: Standard Home Equipment: Civil engineer, contracting Additional Comments: pt is caregiver for spouse, 92yo nephew and 69mo granddaughter  Functional History: Prior Function Level of Independence: Independent Functional Status:  Mobility: Bed Mobility Overal bed mobility: Needs Assistance Bed Mobility: Supine to Sit Sit to supine: Mod assist General bed mobility comments: assist to move Rt LE off bed, and assist to lift trunk, pt utilizing  LUE to help push off of bed Transfers Overall transfer level: Needs assistance Equipment used: None Transfers: Sit to/from Stand, Stand Pivot Transfers Sit to Stand: Mod assist, +2 physical assistance, +2 safety/equipment Stand pivot transfers: Mod assist, +2 physical assistance, +2  safety/equipment General transfer comment: Pt initially with buckling of Rt knee.  SHe was able to minimally activate Rt LE during weight shift and transfer with hyperextension of Rt knee noted. Pt with heavy reliance on LLE with ability to scoot foot to left for pivot to chair with bil UE support      ADL: ADL Overall ADL's : Needs assistance/impaired Eating/Feeding: Moderate assistance, Sitting Grooming: Brushing hair, Minimal assistance, Sitting Grooming Details (indicate cue type and reason): assist to initiate task.  Pt initially moved comb to cheek then her mouth.  Once she was able to initiate combing her hair, she was able to complete task - she did attend to left side of head  Upper Body Bathing: Maximal assistance, Sitting Lower Body Bathing: Total assistance, Sit to/from stand Upper Body Dressing : Sitting, Total assistance Lower Body Dressing: Total assistance, Sit to/from stand Toilet Transfer: Moderate assistance, +2 for safety/equipment, +2 for physical assistance, Stand-pivot, BSC Toileting- Clothing Manipulation and Hygiene: Total assistance, Sit to/from stand Functional mobility during ADLs: Moderate assistance, +2 for physical assistance, +2 for safety/equipment General ADL Comments: Pt with Rt hemiplegia and Rt neglect/inattention   Cognition: Cognition Overall Cognitive Status: Difficult to assess Orientation Level: (UTA, patient non verbal) Cognition Arousal/Alertness: Awake/alert Behavior During Therapy: Flat affect Overall Cognitive Status: Difficult to assess Area of Impairment: Following commands Following Commands: Follows one step commands inconsistently, Follows one step commands with increased time General Comments: pt unable to verbalize Difficult to assess due to: Impaired communication  Blood pressure (!) 156/78, pulse 89, temperature 98.3 F (36.8 C), temperature source Axillary, resp. rate (!) 21, height 5\' 1"  (1.549 m), weight 74.5 kg (164 lb 3.9  oz), SpO2 92 %. Physical Exam  Vitals reviewed. Constitutional: She appears well-developed.  Obese  HENT:  Head: Normocephalic and atraumatic.  Eyes: Right eye exhibits no discharge. Left eye exhibits no discharge.  Pupils reactive to light  Neck: Normal range of motion. Neck supple. No thyromegaly present.  Cardiovascular: Normal rate, regular rhythm and normal heart sounds.  Respiratory: Effort normal and breath sounds normal. No respiratory distress.  GI: Soft. Bowel sounds are normal. She exhibits no distension.  Musculoskeletal:  No edema LE  Neurological: She is alert.  Patient globally aphasic.  Unarousable Patient does withdrawal to painful stimuli LLE, but not RLE  Skin: Skin is warm and dry.  Psychiatric:  Unable to assess due to mentation    Results for orders placed or performed during the hospital encounter of 04/06/17 (from the past 24 hour(s))  Glucose, capillary     Status: Abnormal   Collection Time: 04/08/17  3:53 PM  Result Value Ref Range   Glucose-Capillary 126 (H) 65 - 99 mg/dL  Glucose, capillary     Status: Abnormal   Collection Time: 04/08/17  7:45 PM  Result Value Ref Range   Glucose-Capillary 133 (H) 65 - 99 mg/dL  Glucose, capillary     Status: Abnormal   Collection Time: 04/09/17  8:20 AM  Result Value Ref Range   Glucose-Capillary 129 (H) 65 - 99 mg/dL   Mr Brain Wo Contrast  Result Date: 04/07/2017 CLINICAL DATA:  Initial evaluation for acute stroke, status post tPA. EXAM: MRI HEAD WITHOUT CONTRAST TECHNIQUE: Multiplanar, multiecho pulse  sequences of the brain and surrounding structures were obtained without intravenous contrast. COMPARISON:  Prior CT from earlier the same day as well as prior CTs and CTAs from 04/06/2017. FINDINGS: Brain: Study mildly degraded by motion artifact. Diffuse prominence of the CSF containing spaces compatible with generalized cerebral atrophy. Patchy and confluent T2/FLAIR hyperintensity within the periventricular and  deep white matter both cerebral hemispheres most consistent with chronic small vessel ischemic disease, moderate in nature. Abnormal restricted diffusion seen involving the left basal ganglia, compatible with acute left MCA territory infarct. There are overlying cortical and subcortical infarcts extending in a somewhat linear distribution involving the left fronto, parietal, and occipital lobes, involving the left MCA territory and left MCA/PCA border zone. These are watershed in appearance. Faint petechial hemorrhage about a few areas of infarction without hemorrhagic transformation (series 7, image 19). No significant mass effect. No mass lesion, midline shift or mass effect. No hydrocephalus. No extra-axial fluid collection. Major dural sinuses are grossly patent. Pituitary gland suprasellar region normal. Midline structures intact. Vascular: Major intravascular flow voids are maintained. Skull and upper cervical spine: Craniocervical junction normal. Upper cervical spine within normal limits. Bone marrow signal intensity within normal limits. No scalp soft tissue abnormality. Sinuses/Orbits: Globes and orbital soft tissues within normal limits. Extensive mucosal thickening throughout the paranasal sinuses, greatest within the maxillary sinuses. No mastoid effusion. Middle ear structures grossly normal. Other: None IMPRESSION: 1. Acute multifocal ischemic left MCA and left MCA/PCA border zone infarcts involving the left cerebral hemisphere as above. Some of these infarcts are positioned in a somewhat watershed fashion. Associated faint petechial hemorrhage without hemorrhagic transformation or mass effect. 2. Underlying moderate chronic small vessel ischemic disease. 3. Pan sinusitis. Electronically Signed   By: Jeannine Boga M.D.   On: 04/07/2017 17:57    Assessment/Plan: Diagnosis: Multifocal ischemic left MCA and PCA infarcts Labs and images independently reviewed.  Records reviewed and summated  above. Stroke: Continue secondary stroke prophylaxis and Risk Factor Modification listed below:   Antiplatelet therapy:   Blood Pressure Management:  Continue current medication with prn's with permisive HTN per primary team Statin Agent:   Prediabetes management:   ?Left sided hemiparesis: fit for orthosis to prevent contractures (resting hand splint for day, wrist cock up splint at night, PRAFO, etc) Motor recovery: Fluoxetine  1. Does the need for close, 24 hr/day medical supervision in concert with the patient's rehab needs make it unreasonable for this patient to be served in a less intensive setting? Yes  2. Co-Morbidities requiring supervision/potential complications: post-stroke Dysphagia (advance diet as tolerated), diastolic dysfunction (monitor for signs/symptoms of fluid overload), HTN (monitor and provide prns in accordance with increased physical exertion and pain), tachypnea (monitor RR and O2 Sats with increased physical exertion), prediabetes (Monitor in accordance with exercise and adjust meds as necessary), leukocytosis (cont to monitor for signs and symptoms of infection, further workup if indicated) 3. Due to bladder management, bowel management, safety, skin/wound care, disease management, medication administration, pain management and patient education, does the patient require 24 hr/day rehab nursing? Yes 4. Does the patient require coordinated care of a physician, rehab nurse, PT (1-2 hrs/day, 5 days/week), OT (1-2 hrs/day, 5 days/week) and SLP (1-2 hrs/day, 5 days/week) to address physical and functional deficits in the context of the above medical diagnosis(es)? Yes Addressing deficits in the following areas: balance, endurance, locomotion, strength, transferring, bowel/bladder control, bathing, dressing, feeding, grooming, toileting, cognition, speech, language, swallowing and psychosocial support 5. Can the patient actively participate in  an intensive therapy program of at  least 3 hrs of therapy per day at least 5 days per week? Potentially 6. The potential for patient to make measurable gains while on inpatient rehab is excellent 7. Anticipated functional outcomes upon discharge from inpatient rehab are min assist  with PT, min assist with OT, min assist and mod assist with SLP. 8. Estimated rehab length of stay to reach the above functional goals is: 19-24 days. 9. Anticipated D/C setting: Home 10. Anticipated post D/C treatments: HH therapy and Home excercise program 11. Overall Rehab/Functional Prognosis: good  RECOMMENDATIONS: This patient's condition is appropriate for continued rehabilitative care in the following setting: CIR once medically appropriate and able to participate in 3 hours of therapy/day. Patient has agreed to participate in recommended program. Potentially Note that insurance prior authorization may be required for reimbursement for recommended care.  Comment: Rehab Admissions Coordinator to follow up.  Delice Lesch, MD, ABPMR Lavon Paganini Angiulli, PA-C 04/09/2017

## 2017-04-09 NOTE — Progress Notes (Signed)
Inpatient Rehabilitation  Per PT, OT, SLP request, patient was re-screened by Gunnar Fusi for appropriateness for an Inpatient Acute Rehab consult.  At this time we are recommending an Inpatient Rehab consult.  Please order if you are agreeable.    Carmelia Roller., CCC/SLP Admission Coordinator  [place]  Cell [phone]

## 2017-04-09 NOTE — Progress Notes (Addendum)
Initial Nutrition Assessment  DOCUMENTATION CODES:   Obesity unspecified  INTERVENTION:   RD to order Hormel Shake BID; each supplement provides 500kcal and 23g protein.   NUTRITION DIAGNOSIS:   Inadequate oral intake related to dysphagia, lethargy/confusion as evidenced by other (comment)(recent diet advancement to DYS 1, nectar thick liquids from NPO).   GOAL:   Patient will meet greater than or equal to 90% of their needs  MONITOR:   PO intake, Supplement acceptance, Diet advancement, Weight trends, Labs, I & O's  REASON FOR ASSESSMENT:   Consult Enteral/tube feeding initiation and management  ASSESSMENT:   67 y.o.femalewith PMH of HTN, kidney stones, and cholecystectomy. Presented to the ED after acute onset of right sided weakness, right facial droop and AMS. S/P subacute left sided basal ganglia nonhemorrhagic infarct.  Pt was not able to be woken. Daughter at bedside provided diet and wt hx.   PTA daughter reports pt was eating "whatever she wanted" and had no issues with appetite, N/V. Daughter reports pt did have some level of IBS type symptoms periodically and was unsure if she had had her gallbladder removed. Daughter also states pt has "borderline diabetes." DM lab values outlined below.    Daughter was tearful at bedside, reporting pt was more responsive in the previous 2 days than she is this morning. Daughter is hopeful the initiation of nutrition will help pt improve. Cortrak ordered 3/3 at 1131 and D/C 3/4 at 12:42. Pt was evaluated by SLP after intern visit; diet advanced to Dys 1 with nectar thick liquids.   Daughter feels that pt has not had any recent wt loss. She states a UBW of 170lb for the pt. Per chart review, pt's wt appears to be stable as outlined below.   Medications: Protonix. Cleviprex D/C 3/3 at 1235.  Labs: CBG (last 3)  Recent Labs    04/08/17 1553 04/08/17 1945 04/09/17 0820  GLUCAP 126* 133* 129*   HgbA1c 6.4 on  04/07/17  NUTRITION - FOCUSED PHYSICAL EXAM:  No muscle/fat depletions or signs of micronutrient deficiencies noted.   Diet Order:  Check puncture sites for bleeding or hematomas. Bleeding precautions Fall precautions DIET - DYS 1 Room service appropriate? Yes; Fluid consistency: Nectar Thick  EDUCATION NEEDS:   Not appropriate for education at this time  Skin:  Skin Assessment: Reviewed RN Assessment  Last BM:  3/4 type 7 medium  Height:   Ht Readings from Last 1 Encounters:  04/06/17 5\' 1"  (1.549 m)    Weight:   Wt Readings from Last 15 Encounters:  04/06/17 164 lb 3.9 oz (74.5 kg)  02/28/17 175 lb (79.4 kg)  12/01/16 178 lb (80.7 kg)  01/12/15 166 lb (75.3 kg)   04/07/2016  171lb 3.2oz at [facility]  Ideal Body Weight:  47.7 kg  BMI:  Body mass index is 31.03 kg/m.  Estimated Nutritional Needs:   Kcal:  1600-1800  Protein:  90-100  Fluid:  1.6-1.8L    Aryahi Denzler, MS, Dietetic Intern Pager # [phone]

## 2017-04-09 NOTE — Progress Notes (Signed)
STROKE TEAM PROGRESS NOTE   HISTORY OF PRESENT ILLNESS (per record) Ebony Williams is an 67 y.o. female who was initially evaluated at the [facility] after presenting there early this evening with acute onset 2 hours previously of right sided weakness, right facial droop and AMS. She was last seen normal by husband at 42, then found by grandson in her bedroom unresponsive at 1530. EMS was called and on their arrival, the patient was found to be gazing leftward with right-sided weakness and difficulty producing speech.   CT head at OSH revealed a  subacute left sided basal ganglia nonhemorrhagic infarct. 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 There were no contraindications to tPA;husband consented to tPA administration. LVO or proximal embolic source were possible and a CTA/CTP was obtained, which showed chronic appearing severe proximal left M1 stenosis, without LVO. There was a fixed/matched perfusion defect at the left parietal-occipital junction with increased transit time in BG, but no large cortical perfusion mismatch. The patient was determined by Teleneurology consultant to not be an intervention/thrombectomy candidate with these findings.  After tPA was administered, per EDP at OSH, the patient appeared to have a positive response.     SUBJECTIVE (INTERVAL HISTORY) The patient's daughter, husband and multiple family members were at the bedside. The patient is aphasic.   awake but not following commands. She had modified barium swallow today and was cleared for dysphagia 1 diet   OBJECTIVE Temp:  [97.8 F (36.6 C)-99 F (37.2 C)] 98.3 F (36.8 C) (03/04 1200) Pulse Rate:  [60-101] 89 (03/04 1400) Cardiac Rhythm: Normal sinus rhythm (03/04 0800) Resp:  [19-27] 21 (03/04 1400) BP: (134-187)/(60-138) 156/78 (03/04 1400) SpO2:  [92 %-98 %] 92 % (03/04 1400)  CBC:  Recent Labs  Lab 04/06/17 1714  04/06/17 1739  WBC 11.5*  --   NEUTROABS 9.3*  --   HGB 15.9* 15.6*  HCT 47.4* 46.0  MCV 90.8  --   PLT 236  --     Basic Metabolic Panel:  Recent Labs  Lab 04/06/17 1714 04/06/17 1739  NA 138 139  K 3.6 3.7  CL 102 102  CO2 25  --   GLUCOSE 143* 141*  BUN 12 11  CREATININE 0.82 0.80  CALCIUM 9.4  --     Lipid Panel:     Component Value Date/Time   CHOL 159 04/07/2017 0226   TRIG 98 04/07/2017 0226   HDL 32 (L) 04/07/2017 0226   CHOLHDL 5.0 04/07/2017 0226   VLDL 20 04/07/2017 0226   LDLCALC 107 (H) 04/07/2017 0226   HgbA1c:  Lab Results  Component Value Date   HGBA1C 6.4 (H) 04/07/2017   Urine Drug Screen: No results found for: LABOPIA, COCAINSCRNUR, LABBENZ, AMPHETMU, THCU, LABBARB  Alcohol Level     Component Value Date/Time   ETH <10 04/06/2017 1714    IMAGING  Ct Angio Head W Or Wo Contrast 04/07/2017 IMPRESSION:  1. Unchanged CTA examination without emergent large vessel occlusion. No intracranial hemorrhage.  2. Moderate proximal left MCA M1 segment stenosis with mild elevation of Tmax in the left MCA/PCA watershed area. This is also unchanged and likely due to the proximal M1 stenosis.  3. No acute infarct by CBF criteria. The previously seen 4 mL fixed perfusion defect in the left posterior watershed territory is no longer present. This may be due to residual intravascular enhancement from the earlier study.  4. Unchanged left basal ganglia  perfusion abnormality, which remains indeterminate and is not demonstrated on the final Tmax or CBF maps.     Ct Angio Head W Or Wo Contrast Ct Angio Neck W Or Wo Contrast Ct Cerebral Perfusion W Contrast 04/06/2017 IMPRESSION:   CTA NECK:  1. No hemodynamically significant stenosis or acute vascular process.  2. Incidental aberrant RIGHT subclavian artery.  3. **An incidental finding of potential clinical significance has been found. 3.7 cm ascending aorta. Recommend annual imaging followup by CTA or MRA.  This recommendation follows 2010 ACCF/AHA/AATS/ACR/ASA/SCA/SCAI/SIR/STS/SVM Guidelines for the Diagnosis and Management of Patients with Thoracic Aortic Disease. Circulation.2010; 121: U725-D664**   CTA HEAD:  1. 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  2. No emergent large vessel occlusion.   CT PERFUSION:  1. Fixed LEFT parietoccipital junction perfusion defect (posterior watershed territory).  2. LEFT basal ganglia perfusion abnormality could reflect infarct or, penumbra and is not demonstrated on final map.  3. Prolonged LEFT MCA T-max on early phase, normalized by 8 seconds compatible with proximal stenosis.   Ct Head Wo Contrast 04/06/2017 IMPRESSION:  No acute abnormality. Expected retained intravascular contrast from earlier CTA study.     Dg Chest Port 1 View 04/07/2017 IMPRESSION:  Mild hazy left basilar airspace disease which may reflect atelectasis versus pneumonia.   Ct Head Code Stroke Wo Contrast 04/06/2017 IMPRESSION:  1. Acute suspected LEFT basal ganglia nonhemorrhagic infarct (MCA territory versus perforated ir).  2. Moderate to severe chronic small vessel ischemic disease and old lacunar infarcts.     Transthoracic Echocardiogram - Left ventricle: The cavity size was normal. Wall thickness was   increased in a pattern of mild LVH. There was moderate focal   basal hypertrophy of the septum. Systolic function was vigorous.   The estimated ejection fraction was in the range of 65% to 70%.   Wall motion was normal; there were no regional wall motion   abnormalities    PHYSICAL EXAM Vitals:   04/09/17 1100 04/09/17 1153 04/09/17 1200 04/09/17 1400  BP: (!) 168/67 (!) 173/61 (!) 176/77 (!) 156/78  Pulse: 84 85 83 89  Resp: (!) 21  (!) 23 (!) 21  Temp:   98.3 F (36.8 C)   TempSrc:   Axillary   SpO2: 97% 94% 96% 92%  Weight:      Height:       Pleasant middle-aged Caucasian lady who is not in distress. . Afebrile. Head is  nontraumatic. Neck is supple without bruit.    Cardiac exam no murmur or gallop. Lungs are clear to auscultation. Distal pulses are well felt.  Neurological Exam :  Awake alert globally aphasic.  Mute.  Will not follow simple commands including midline commands.  Left gaze preference but able to look to the right past midline.  Blinks to threat on the left but not the right.  Right lower facial weakness.  Tongue midline.  Dense right hemiplegia with only trace withdrawal to painful stimuli in the right leg and right upper extremity.  Purposeful antigravity movements on the left.  Sensation likely diminished on the right.  Gait not tested.   ASSESSMENT/PLAN Ebony Williams is a 67 y.o. female with history of hypertension presenting with right-sided weakness, right facial droop, altered mental status, decreased level of responsiveness, beech difficulties, and leftward gaze. She received IV TPA at [facility] on Friday 04/06/2017.  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 MRI head -  Acute multifocal ischemic left MCA and left MCA/PCA border zone infarcts involving the left cerebral hemisphere as above. Some of these infarcts are positioned in a somewhat watershed fashion.Associated faint petechial hemorrhage without hemorrhagic transformation or mass effect.  MRA head - not performed  CTA H&N -  Severe stenosis LEFT M1 origin, possible chronic thromboembolism.   Carotid Doppler - CTA neck  2D Echo - normal EF no cardiac source of embolism  LDL - 107  HgbA1c - 6.4  VTE prophylaxis - SCDs Check puncture sites for bleeding or hematomas. Bleeding precautions Fall precautions DIET - DYS 1 Room service appropriate? Yes; Fluid consistency: Nectar Thick  No antithrombotic prior to admission, now on aspirin post TPA.  Patient will be  counseled to be compliant with her antithrombotic medications  Ongoing aggressive stroke risk factor management  Therapy recommendations:  CLR  Disposition:  Pending  Hypertension  Stable  Permissive hypertension (OK if < 220/120) but gradually normalize in 5-7 days  Long-term BP goal normotensive  Hyperlipidemia  Home meds: No lipid lowering medications prior to admission.  LDL 107, goal < 70  Add Lipitor when PO access is available.  Continue statin at discharge    Other Stroke Risk Factors  Advanced age  ETOH use, advised to drink no more than 1 drink per day.  Obesity, Body mass index is 31.03 kg/m., recommend weight loss, diet and exercise as appropriate   Hx stroke/TIA - by imaging   Other Active Problems  3.7 cm ascending aorta. Recommend annual imaging followup by CTA or MRA.  NPO - tube feedings to be discussed.   Plan / Recommendations   Stroke workup -completed Continue  Aspirin and Plavix .  Dysphagia 1 diet. Mobilize out of bed. Therapy and rehabilitation consults. Long discussion with husband and daughter and multiple family members about prognosis and answered questions.   Hospital day # 3     I have personally examined this patient, reviewed notes, independently viewed imaging studies, participated in medical decision making and plan of care.ROS completed by me personally and pertinent positives fully documented  I have made any additions or clarifications directly to the above note. This patient is critically ill and at significant risk of neurological worsening, death and care requires constant monitoring of vital signs, hemodynamics,respiratory and cardiac monitoring, extensive review of multiple databases, frequent neurological assessment, discussion with family, other specialists and medical decision making of high complexity.I have made any additions or clarifications directly to the above note.This critical care time does not reflect  procedure time, or teaching time or supervisory time of PA/NP/Med Resident etc but could involve care discussion time.  I spent 35 minutes of neurocritical care time  in the care of  this patient.      Antony Contras, MD Medical Director [employer] Pager: [phone] 04/09/2017 3:40 PM   To contact Stroke Continuity provider, please refer to [email or web address] After hours, contact General Neurology

## 2017-04-09 NOTE — Progress Notes (Signed)
Modified Barium Swallow Progress Note  Patient Details  Name: Ebony Williams MRN: [id number] Date of Birth: May 09, 1950  Today's Date: 04/09/2017  Modified Barium Swallow completed.  Full report located under Chart Review in the Imaging Section.  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 Pt initially appeared frozen with jaw clenched, right hand and arm tight to chest, minimal labial movement or response to total assist feeding with a spoon. When given hand over hand assist with a cup, pt slowly began responding to motor movement and sensory feedback with PO. She started to open her mouth, the gradually propel bolus to oropharynx with pumping mechanism. After a few swallows were triggered on spillage of boluses pt significnatly improved. She was very successful with self feeding puree with a spoon, only min assist was given to transition to next step motorically. There was mild buccal residue on the right, but no oropharyngeal residuals. She continues to struggle a bit with nectar; she requires some sensation of bolus in pharynx to trigger a swallow, which does lead to delayed initaition and sensed penetration and aspiration events before the swallow at times. Given that sensation is excellent and cough is very strong, expelling all aspirate, nectar thick liquids are still recommended for the ideal speed of bolus to faciliate oral transit. Expect pts automaticity to progress with further opportunities to eat and drink with hand over hand assist, upright in chair in the most natural enviornment as possible. Discussed at length with pts daughter. Will intiaite a dys 1 (puree) and nectar thick diet though ice cream, thick smoothies and milk shakes are also permitted in this case. Suction to oral cavity should be completed intermittently during meal. WIll follow for tolerance and therapeutic  interventions to advance pt.    Swallow Evaluation Recommendations       SLP Diet Recommendations: Dysphagia 1 (Puree) solids;Nectar thick liquid   Liquid Administration via: Cup;Straw   Medication Administration: Crushed with puree   Supervision: Patient able to self feed   Compensations: (hand over hand assist)   Postural Changes: Seated upright at 90 degrees   Oral Care Recommendations: Oral care BID   Other Recommendations: Order thickener from pharmacy;Have oral suction available    Aizlyn Schifano, Katherene Ponto 04/09/2017,12:12 PM

## 2017-04-09 NOTE — Evaluation (Signed)
Physical Therapy Evaluation Patient Details Name: EMMALY LEECH MRN: [id number] DOB: [birth year] Today's Date: 04/09/2017   History of Present Illness  67 yo admitted with left basal gangli infarct s/p tPA with right hemiplegia and left gaze. PMHx: HTN  Clinical Impression  Pt sleeping on arrival and initially responsive and withdrawing LLE to tickling but then extinguished the response after about 1 min. Pt with left gaze preference and able to track to midline with commands and cues end of session. Pt with flaccid RLE with hyperextension in standing but no withdrawal to pain or activation throughout session. Pt with expressive aphasia and no vocalization throughout session with noted apraxia with combing hair. Pt with hemiplegia, decreased balance, cognition and function who will benefit from acute therapy to maximize mobility, safety, transfers and function to decrease burden of care.     Follow Up Recommendations CIR;Supervision/Assistance - 24 hour    Equipment Recommendations  Other (comment)(TBD)    Recommendations for Other Services       Precautions / Restrictions Precautions Precautions: Fall Precaution Comments: right hemiplegia and neglect      Mobility  Bed Mobility Overal bed mobility: Needs Assistance Bed Mobility: Supine to Sit       Sit to supine: Mod assist   General bed mobility comments: assist to move Rt LE off bed, and assist to lift trunk, pt utilizing LUE to help push off of bed  Transfers Overall transfer level: Needs assistance Equipment used: None Transfers: Sit to/from [employer] Transfers Sit to Stand: Mod assist;+2 physical assistance;+2 safety/equipment Stand pivot transfers: Mod assist;+2 physical assistance;+2 safety/equipment       General transfer comment: Pt initially with buckling of Rt knee.  SHe was able to minimally activate Rt LE during weight shift and transfer with hyperextension of Rt knee noted. Pt with heavy reliance on  LLE with ability to scoot foot to left for pivot to chair with bil UE support  Ambulation/Gait                Stairs            Wheelchair Mobility    Modified Rankin (Stroke Patients Only) Modified Rankin (Stroke Patients Only) Pre-Morbid Rankin Score: No symptoms Modified Rankin: Severe disability     Balance Overall balance assessment: Needs assistance Sitting-balance support: Feet supported Sitting balance-Leahy Scale: Poor Sitting balance - Comments: Pt initially requiring min A - demonstrated posterior lean, but able to corrrect and progressed to min guard assist  Postural control: Posterior lean   Standing balance-Leahy Scale: Poor Standing balance comment: bil UE support in standing                             Pertinent Vitals/Pain Pain Assessment: Faces Faces Pain Scale: No hurt    Home Living Family/patient expects to be discharged to:: Private residence Living Arrangements: Spouse/significant other Available Help at Discharge: Family;Available 24 hours/day Type of Home: House Home Access: Stairs to enter   [employer] of Steps: 3 Home Layout: One level Home Equipment: Shower seat Additional Comments: pt is caregiver for spouse, 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    Prior Function Level of Independence: Independent               Hand Dominance   Dominant Hand: Right    Extremity/Trunk Assessment   Upper Extremity Assessment Upper Extremity Assessment: Defer to OT evaluation RUE Deficits / Details:  RUE Sensation:  RUE  Coordination:     Lower Extremity Assessment Lower Extremity Assessment: RLE deficits/detail RLE Deficits / Details: no AROM, no response to painful stimuli, pt with knee hyperextension in standing    Cervical / Trunk Assessment Cervical / Trunk Assessment: Normal  Communication   Communication: No difficulties  Cognition Arousal/Alertness: Awake/alert Behavior During Therapy: Flat  affect Overall Cognitive Status: Difficult to assess Area of Impairment: Following commands                       Following Commands: Follows one step commands inconsistently;Follows one step commands with increased time       General Comments: pt unable to verbalize      General Comments General comments (skin integrity, edema, etc.): Son, daughter, and father present.  They are very supportive     Exercises     Assessment/Plan    PT Assessment Patient needs continued PT services  PT Problem List Decreased strength;Decreased mobility;Decreased safety awareness;Decreased activity tolerance;Decreased coordination;Decreased cognition;Decreased balance;Decreased knowledge of use of DME;Impaired sensation       PT Treatment Interventions Gait training;Therapeutic exercise;Patient/family education;DME instruction;Therapeutic activities;Cognitive remediation;Balance training;Functional mobility training;Neuromuscular re-education    PT Goals (Current goals can be found in the Care Plan section)  Acute Rehab PT Goals Patient Stated Goal: improve mobility PT Goal Formulation: With patient/family Time For Goal Achievement: 04/23/17 Potential to Achieve Goals: Fair    Frequency Min 4X/week   Barriers to discharge        Co-evaluation PT/OT/SLP Co-Evaluation/Treatment: Yes Reason for Co-Treatment: Complexity of the patient's impairments (multi-system involvement);For patient/therapist safety PT goals addressed during session: Mobility/safety with mobility;Balance OT goals addressed during session: ADL's and self-care;Strengthening/ROM       AM-PAC PT "6 Clicks" Daily Activity  Outcome Measure Difficulty turning over in bed (including adjusting bedclothes, sheets and blankets)?: Unable Difficulty moving from lying on back to sitting on the side of the bed? : Unable Difficulty sitting down on and standing up from a chair with arms (e.g., wheelchair, bedside commode,  etc,.)?: Unable Help needed moving to and from a bed to chair (including a wheelchair)?: A Lot Help needed walking in hospital room?: A Lot Help needed climbing 3-5 steps with a railing? : Total 6 Click Score: 8    End of Session Equipment Utilized During Treatment: Gait belt Activity Tolerance: Patient tolerated treatment well Patient left: in chair;with call bell/phone within reach;with family/visitor present;with chair alarm set Nurse Communication: Mobility status PT Visit Diagnosis: Unsteadiness on feet (R26.81);Other symptoms and signs involving the nervous system (R29.898);Hemiplegia and hemiparesis Hemiplegia - Right/Left: Right Hemiplegia - dominant/non-dominant: Dominant Hemiplegia - caused by: Cerebral infarction    Time: 7893-8101 PT Time Calculation (min) (ACUTE ONLY): 33 min   Charges:   PT Evaluation $PT Eval Moderate Complexity: 1 Mod     PT G Codes:        Elwyn Reach, PT [phone]   Miasia Crabtree B Jakobi Thetford 04/09/2017, 1:14 PM

## 2017-04-09 NOTE — Evaluation (Signed)
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 PMHx: HTN   Clinical Impression   Pt admitted with above. She demonstrates the below listed deficits and will benefit from continued OT to maximize safety and independence with BADLs.  Pt presents to OT with Rt hemiplegia, increased extensor spasticity, Rt neglect, ideomotor and ideational apraxia, impaired balance, as well as impaired communication.  Currently, she requires max - total A for ADLs and mod A +2 for functional transfers.  PTA, she lived with spouse and nephew, who she has custody of (16y.o).  She frequetnly cared for her grandchildren and was fully independent.   Family is very supportive.  Feel she would benefit from the intensity and consistency provided in inpatient rehab.   Will follow acutely.       Follow Up Recommendations  CIR;Supervision/Assistance - 24 hour    Equipment Recommendations  None recommended by OT    Recommendations for Other Services       Precautions / Restrictions Precautions Precautions: Fall Precaution Comments: right hemiplegia and neglect      Mobility Bed Mobility Overal bed mobility: Needs Assistance Bed Mobility: Supine to Sit       Sit to supine: Mod assist   General bed mobility comments: assist to move Rt LE off bed, and assist to lift trunk, pt utilizing LUE to help push off of bed  Transfers Overall transfer level: Needs assistance Equipment used: None Transfers: Sit to/from [employer] Transfers Sit to Stand: Mod assist;+2 physical assistance;+2 safety/equipment Stand pivot transfers: Mod assist;+2 physical assistance;+2 safety/equipment       General transfer comment: Pt initially with buckling of Rt knee.  SHe was able to minimally activate Rt LE during weight shift  and transfer with hyperextension of Rt knee noted. Pt with heavy reliance on LLE with ability to scoot foot to left for pivot to chair with bil UE support    Balance Overall balance assessment: Needs assistance Sitting-balance support: Feet supported Sitting balance-Leahy Scale: Poor Sitting balance - Comments: Pt initially requiring min A - demonstrated posterior lean, but able to corrrect and progressed to min guard assist  Postural control: Posterior lean   Standing balance-Leahy Scale: Poor Standing balance comment: bil UE support in standing                           ADL either performed or assessed with clinical judgement   ADL Overall ADL's : Needs assistance/impaired Eating/Feeding: Moderate assistance;Sitting   Grooming: Brushing hair;Minimal assistance;Sitting Grooming Details (indicate cue type and reason): assist to initiate task.  Pt initially moved comb to cheek then her mouth.  Once she was able to initiate combing her hair, she was able to complete task - she did attend to left side of head  Upper Body Bathing: Maximal assistance;Sitting   Lower Body Bathing: Total assistance;Sit to/from stand   Upper Body Dressing : Sitting;Total assistance   Lower Body Dressing: Total assistance;Sit to/from stand   Toilet Transfer: Moderate assistance;+2 for safety/equipment;+2 for physical assistance;Stand-pivot;BSC   Toileting- Clothing Manipulation and Hygiene: Total assistance;Sit to/from stand       Functional mobility during ADLs: Moderate assistance;+2 for physical assistance;+2 for safety/equipment General ADL Comments: Pt with Rt hemiplegia and Rt neglect/inattention      Vision Baseline  Vision/History: Wears glasses Wears Glasses: At all times Patient Visual Report: (Pt unable to provide info ) Additional Comments: Pt unable to provide info re: vision.  She demonstrates Lt gaze preference.  With mod - max cues, she was able to track therapist to  midline.  By end of session, she was able to track intermittently to the right.       Perception Perception Perception Tested?: Yes Perception Deficits: Inattention/neglect Inattention/Neglect: Does not attend to right visual field;Does not attend to right side of body Comments: With max facilitation and cues able to locate Rt Hand and apply lotion to Rt hand after assist provided to initiate task    [place] tested?: Deficits Deficits: Initiation;Ideation;Ideomotor    Pertinent Vitals/Pain Pain Assessment: Faces Faces Pain Scale: No hurt     Hand Dominance Right   Extremity/Trunk Assessment Upper Extremity Assessment Upper Extremity Assessment: Defer to OT evaluation RUE Deficits / Details: Pt with no active movement Rt UE.  Extensor tone presents.  Pt with Rt neglect  RUE Sensation: decreased light touch;decreased proprioception RUE Coordination: decreased fine motor;decreased gross motor   Lower Extremity Assessment Lower Extremity Assessment: RLE deficits/detail RLE Deficits / Details: no AROM, no response to painful stimuli, pt with knee hyperextension in standing   Cervical / Trunk Assessment Cervical / Trunk Assessment: Normal   Communication Communication Communication: No difficulties   Cognition Arousal/Alertness: Awake/alert Behavior During Therapy: Flat affect Overall Cognitive Status: Difficult to assess Area of Impairment: Following commands                       Following Commands: Follows one step commands inconsistently;Follows one step commands with increased time       General Comments: pt unable to verbalize   General Comments  Son, daughter, and father present.  They are very supportive     Exercises Exercises: Other exercises Other Exercises Other Exercises: daughter reports she has been performing PROM Rt UE.  She was instructed in safe techniqe and was able to return demonstration.  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 75mo granddaughter      Prior Functioning/Environment Level of Independence: Independent                 OT Problem List: Decreased strength;Decreased range of motion;Decreased activity tolerance;Impaired balance (sitting and/or standing);Decreased coordination;Impaired vision/perception;Decreased cognition;Decreased safety awareness;Decreased knowledge of use of DME or AE;Impaired sensation;Obesity;Impaired UE functional use      OT Treatment/Interventions: Self-care/ADL training;Neuromuscular education;DME and/or AE instruction;Therapeutic activities;Cognitive remediation/compensation;Visual/perceptual remediation/compensation;Patient/family education;Balance training    OT Goals(Current goals can be found in the care plan section) Acute Rehab OT Goals Patient Stated Goal: improve mobility OT Goal Formulation: With patient/family Time For Goal Achievement: 04/23/17 Potential to Achieve Goals: Good ADL Goals Pt Will Perform Eating: with set-up;with supervision;sitting Pt Will Perform Grooming: with set-up;with supervision;sitting Pt Will Perform Upper Body Bathing: with mod assist;sitting Pt Will Transfer to Toilet: with  min assist;stand pivot transfer;bedside commode Pt Will Perform Toileting - Clothing Manipulation and hygiene: with mod assist;sit to/from stand Pt/caregiver will Perform Home Exercise Program: Right Upper extremity;Increased  ROM;Independently Additional ADL Goal #1: Pt will locate ADL items on Rt with min verbal cues Additional ADL Goal #2: Pt will demonstrate selective attention during familiar ADL tasks  OT Frequency: Min 2X/week   Barriers to D/C: Decreased caregiver support          Co-evaluation PT/OT/SLP Co-Evaluation/Treatment: Yes Reason for Co-Treatment: Complexity of the patient's impairments (multi-system involvement);For patient/therapist safety PT goals addressed during session: Mobility/safety with mobility;Balance OT goals addressed during session: ADL's and self-care;Strengthening/ROM      AM-PAC PT "6 Clicks" Daily Activity     Outcome Measure Help from another person eating meals?: A Lot Help from another person taking care of personal grooming?: A Lot Help from another person toileting, which includes using toliet, bedpan, or urinal?: A Lot Help from another person bathing (including washing, rinsing, drying)?: A Lot Help from another person to put on and taking off regular upper body clothing?: A Lot Help from another person to put on and taking off regular lower body clothing?: A Lot 6 Click Score: 12   End of Session Equipment Utilized During Treatment: Gait belt Nurse Communication: Mobility status  Activity Tolerance:   Patient left: in chair;with call bell/phone within reach;with chair alarm set;with family/visitor present  OT Visit Diagnosis: Unsteadiness on feet (R26.81);Hemiplegia and hemiparesis Hemiplegia - Right/Left: Right Hemiplegia - dominant/non-dominant: Dominant Hemiplegia - caused by: Cerebral infarction                Time: 6606-3016 OT Time Calculation (min): 33 min Charges:  OT General Charges $OT Visit: 1 Visit OT Evaluation $OT Eval Moderate Complexity: 1 Mod G-Codes:     [facility], OTR/L [phone]   Lucille Passy M 04/09/2017, 2:07 PM

## 2017-04-09 NOTE — Progress Notes (Signed)
PT Cancellation Note  Patient Details Name: Ebony Williams MRN: [id number] DOB: Mar 07, 1950   Cancelled Treatment:    Reason Eval/Treat Not Completed: Patient at procedure or test/unavailable(pt at [facility]) will plan to reattempt   [place] 04/09/2017, 10:37 AM Elwyn Reach, [place]

## 2017-04-10 MED ORDER — ALPRAZOLAM 0.5 MG PO TABS: 0.5000 mg | ORAL_TABLET | Freq: Three times a day (TID) | ORAL | Status: DC | PRN

## 2017-04-10 MED ORDER — IRBESARTAN 300 MG PO TABS: 300.0000 mg | ORAL_TABLET | Freq: Every day | ORAL | Status: DC

## 2017-04-10 MED ORDER — IRBESARTAN 300 MG PO TABS
300.0000 mg | ORAL_TABLET | Freq: Every day | ORAL | Status: DC
  Administered 2017-04-10 – 2017-04-11 (×2): 300 mg via ORAL
  Filled 2017-04-10 (×2): qty 1

## 2017-04-10 MED ORDER — HYDROCHLOROTHIAZIDE 12.5 MG PO CAPS
12.5000 mg | ORAL_CAPSULE | Freq: Every day | ORAL | Status: DC
  Administered 2017-04-10 – 2017-04-11 (×2): 12.5 mg via ORAL
  Filled 2017-04-10 (×2): qty 1

## 2017-04-10 NOTE — Progress Notes (Signed)
SLP Cancellation Note  Patient Details Name: Ebony Williams MRN: [id number] DOB: Dec 04, 1950   Cancelled treatment:       Reason Eval/Treat Not Completed: Fatigue/lethargy limiting ability to participate. Pt sleeping this am. Checked in with daughter and RN, she has been successfully taking nectar thick liquids, smoothies, puddings and purees without signs of aspiration. She does have pocketing on right. Will f/u when pt more alert for therapy.    Maciej Schweitzer, Katherene Ponto 04/10/2017, 10:36 AM

## 2017-04-10 NOTE — Progress Notes (Signed)
Patient did well today initiating her own eating. She has been very lethargic but will arouse to follow few commands. A low urine output during the day with the purewick but bladder scanner only revealed 166mLs. Pt is not distended or in any pain. MD order for transfer to the floor. Will monitor closely. Alyssandra Hulsebus, Rande Brunt, RN

## 2017-04-10 NOTE — Progress Notes (Signed)
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 PMHx: HTN    PT Comments    Pt eating with daughter assist on arrival but when food moved away pt closing eyes and appears fatigued. Pt with improved sitting balance and actually responding verbally with name and "good morning" unable to state daughters name and decreased midline gaze today. Pt with pocketing food and made daughter and speech therapist aware with pt able to clear with liquids. Pt progressing but fatigued after session. Will follow.     Follow Up Recommendations  CIR;Supervision/Assistance - 24 hour     Equipment Recommendations       Recommendations for Other Services       Precautions / Restrictions Precautions Precautions: Fall Precaution Comments: right hemiplegia and neglect Restrictions Weight Bearing Restrictions: No    Mobility  Bed Mobility Overal bed mobility: Needs Assistance Bed Mobility: Supine to Sit     Supine to sit: Mod assist     General bed mobility comments: assist to move Rt LE off bed, and assist to lift trunk, pt utilizing LUE to help push off of bed along with rail   Transfers Overall transfer level: Needs assistance   Transfers: Sit to/from Stand;Stand Pivot Transfers Sit to Stand: Mod assist;+2 physical assistance;+2 safety/equipment Stand pivot transfers: Mod assist;+2 physical assistance;+2 safety/equipment       General transfer comment: pt initially with weight on right toes with assist to place heel on ground pt hyperextends knee and flexes at hip to tolerate weight on RLE with pt standing and scooting RLE to pivot to chair with bil UE supported. Performed sit to stand x 2 trials with inability to position RLE out of hyperextension in standing  Ambulation/Gait              General Gait Details: unable   Stairs            Wheelchair Mobility    Modified Rankin (Stroke Patients Only)       Balance Overall balance assessment: Needs assistance   Sitting balance-Leahy Scale: Fair Sitting balance - Comments: pt able to sit with minguard assist EOB with midline posture x 5 min with cues to attend to left and redirection to task     Standing balance-Leahy Scale: Poor Standing balance comment: bil UE support in standing                            Cognition Arousal/Alertness: Awake/alert Behavior During Therapy: Flat affect Overall Cognitive Status: Difficult to assess                         Following Commands: Follows one step commands inconsistently;Follows one step commands with increased time       General Comments: pt able to state her name and responded "good morning" when same stated to her. No other response to questions      Exercises      General Comments        Pertinent Vitals/Pain Pain Assessment: (CPOT= 0)    Home Living                      Prior Function  PT Goals (current goals can now be found in the care plan section) Progress towards PT goals: Progressing toward goals    Frequency           PT Plan Current plan remains appropriate    Co-evaluation              AM-PAC PT "6 Clicks" Daily Activity  Outcome Measure  Difficulty turning over in bed (including adjusting bedclothes, sheets and blankets)?: Unable Difficulty moving from lying on back to sitting on the side of the bed? : Unable Difficulty sitting down on and standing up from a chair with arms (e.g., wheelchair, bedside commode, etc,.)?: Unable Help needed moving to and from a bed to chair (including a wheelchair)?: A Lot Help needed walking in hospital room?: A Lot Help needed climbing 3-5 steps with a railing? : Total 6 Click Score: 8    End of Session Equipment Utilized During  Treatment: Gait belt Activity Tolerance: Patient tolerated treatment well Patient left: in chair;with call bell/phone within reach;with family/visitor present;with chair alarm set Nurse Communication: Mobility status;Precautions PT Visit Diagnosis: Unsteadiness on feet (R26.81);Other symptoms and signs involving the nervous system (R29.898);Hemiplegia and hemiparesis Hemiplegia - Right/Left: Right Hemiplegia - dominant/non-dominant: Dominant Hemiplegia - caused by: Cerebral infarction     Time: 8546-2703 PT Time Calculation (min) (ACUTE ONLY): 23 min  Charges:  $Therapeutic Activity: 23-37 mins                    G Codes:       Elwyn Reach, PT [phone]    Janney Priego B Eder Macek 04/10/2017, 9:27 AM

## 2017-04-10 NOTE — Progress Notes (Signed)
Patient up in chair sleeping with her Dad at bedside. I contacted her daughter, Morey Hummingbird, by phone. She states she would like me to pursue an inpt rehab admit and she will arrange with her Step Dad and family 24/7 assist for her Mom at home. She tries to be available every morning when MD rounds, so I will follow up with her in the morning. [phone]

## 2017-04-10 NOTE — Progress Notes (Signed)
STROKE TEAM PROGRESS NOTE   HISTORY OF PRESENT ILLNESS (per record) Ebony Williams is an 67 y.o. female who was initially evaluated at the [facility] after presenting there early this evening with acute onset 2 hours previously of right sided weakness, right facial droop and AMS. She was last seen normal by husband at 29, then found by grandson in her bedroom unresponsive at 1530. EMS was called and on their arrival, the patient was found to be gazing leftward with right-sided weakness and difficulty producing speech.   CT head at OSH revealed a  subacute left sided basal ganglia nonhemorrhagic infarct. 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 There were no contraindications to tPA;husband consented to tPA administration. LVO or proximal embolic source were possible and a CTA/CTP was obtained, which showed chronic appearing severe proximal left M1 stenosis, without LVO. There was a fixed/matched perfusion defect at the left parietal-occipital junction with increased transit time in BG, but no large cortical perfusion mismatch. The patient was determined by Teleneurology consultant to not be an intervention/thrombectomy candidate with these findings.  After tPA was administered, per EDP at OSH, the patient appeared to have a positive response.     SUBJECTIVE (INTERVAL HISTORY) The patient's daughter is at the bedside. The patient is aphasic  awake but now  following  few midline commands. She had modified barium swallow y`day and placed on dysphagia 1 diet and was cleared for dysphagia 1 diet   OBJECTIVE Temp:  [98.4 F (36.9 C)-99.5 F (37.5 C)] 98.4 F (36.9 C) (03/05 1200) Pulse Rate:  [61-90] 67 (03/05 1400) Cardiac Rhythm: Normal sinus rhythm (03/05 0800) Resp:  [19-26] 24 (03/05 1400) BP: (134-179)/(54-85) 171/71 (03/05 1400) SpO2:  [91 %-99 %] 98 % (03/05 1400)  CBC:  Recent Labs  Lab 04/06/17 1714  04/06/17 1739  WBC 11.5*  --   NEUTROABS 9.3*  --   HGB 15.9* 15.6*  HCT 47.4* 46.0  MCV 90.8  --   PLT 236  --     Basic Metabolic Panel:  Recent Labs  Lab 04/06/17 1714 04/06/17 1739  NA 138 139  K 3.6 3.7  CL 102 102  CO2 25  --   GLUCOSE 143* 141*  BUN 12 11  CREATININE 0.82 0.80  CALCIUM 9.4  --     Lipid Panel:     Component Value Date/Time   CHOL 159 04/07/2017 0226   TRIG 98 04/07/2017 0226   HDL 32 (L) 04/07/2017 0226   CHOLHDL 5.0 04/07/2017 0226   VLDL 20 04/07/2017 0226   LDLCALC 107 (H) 04/07/2017 0226   HgbA1c:  Lab Results  Component Value Date   HGBA1C 6.4 (H) 04/07/2017   Urine Drug Screen: No results found for: LABOPIA, COCAINSCRNUR, LABBENZ, AMPHETMU, THCU, LABBARB  Alcohol Level     Component Value Date/Time   ETH <10 04/06/2017 1714    IMAGING  Ct Angio Head W Or Wo Contrast 04/07/2017 IMPRESSION:  1. Unchanged CTA examination without emergent large vessel occlusion. No intracranial hemorrhage.  2. Moderate proximal left MCA M1 segment stenosis with mild elevation of Tmax in the left MCA/PCA watershed area. This is also unchanged and likely due to the proximal M1 stenosis.  3. No acute infarct by CBF criteria. The previously seen 4 mL fixed perfusion defect in the left posterior watershed territory is no longer present. This may be due to residual intravascular enhancement from the earlier study.  4.  Unchanged left basal ganglia perfusion abnormality, which remains indeterminate and is not demonstrated on the final Tmax or CBF maps.     Ct Angio Head W Or Wo Contrast Ct Angio Neck W Or Wo Contrast Ct Cerebral Perfusion W Contrast 04/06/2017 IMPRESSION:   CTA NECK:  1. No hemodynamically significant stenosis or acute vascular process.  2. Incidental aberrant RIGHT subclavian artery.  3. **An incidental finding of potential clinical significance has been found. 3.7 cm ascending aorta. Recommend annual imaging followup by CTA or MRA.  This recommendation follows 2010 ACCF/AHA/AATS/ACR/ASA/SCA/SCAI/SIR/STS/SVM Guidelines for the Diagnosis and Management of Patients with Thoracic Aortic Disease. Circulation.2010; 121: X324-M010**   CTA HEAD:  1. 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  2. No emergent large vessel occlusion.   CT PERFUSION:  1. Fixed LEFT parietoccipital junction perfusion defect (posterior watershed territory).  2. LEFT basal ganglia perfusion abnormality could reflect infarct or, penumbra and is not demonstrated on final map.  3. Prolonged LEFT MCA T-max on early phase, normalized by 8 seconds compatible with proximal stenosis.   Ct Head Wo Contrast 04/06/2017 IMPRESSION:  No acute abnormality. Expected retained intravascular contrast from earlier CTA study.     Dg Chest Port 1 View 04/07/2017 IMPRESSION:  Mild hazy left basilar airspace disease which may reflect atelectasis versus pneumonia.   Ct Head Code Stroke Wo Contrast 04/06/2017 IMPRESSION:  1. Acute suspected LEFT basal ganglia nonhemorrhagic infarct (MCA territory versus perforated ir).  2. Moderate to severe chronic small vessel ischemic disease and old lacunar infarcts.     Transthoracic Echocardiogram - Left ventricle: The cavity size was normal. Wall thickness was   increased in a pattern of mild LVH. There was moderate focal   basal hypertrophy of the septum. Systolic function was vigorous.   The estimated ejection fraction was in the range of 65% to 70%.   Wall motion was normal; there were no regional wall motion   abnormalities    PHYSICAL EXAM Vitals:   04/10/17 1100 04/10/17 1200 04/10/17 1300 04/10/17 1400  BP:  (!) 155/68 (!) 156/60 (!) 171/71  Pulse: 70 65 61 67  Resp: 20 (!) 21 19 (!) 24  Temp:  98.4 F (36.9 C)    TempSrc:  Oral    SpO2: 95% 96% 94% 98%  Weight:      Height:       Pleasant middle-aged Caucasian lady who is not in distress. . Afebrile. Head is nontraumatic. Neck is  supple without bruit.    Cardiac exam no murmur or gallop. Lungs are clear to auscultation. Distal pulses are well felt.  Neurological Exam :  Awake alert globally aphasic.  Mute.  Will   follow  Few simple   midline commands.  Left gaze preference but able to look to the right past midline.  Blinks to threat on the left but not the right.  Right lower facial weakness.  Tongue midline.  Dense right hemiplegia with only trace withdrawal to painful stimuli in the right leg and right upper extremity.  Purposeful antigravity movements on the left.  Sensation likely diminished on the right.  Gait not tested.   ASSESSMENT/PLAN Ebony Williams is a 67 y.o. female with history of hypertension presenting with right-sided weakness, right facial droop, altered mental status, decreased level of responsiveness, beech difficulties, and leftward gaze. She [facility] on Friday 04/06/2017.  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 MRI head -  Acute multifocal ischemic left MCA and left MCA/PCA border zone infarcts involving the left cerebral hemisphere as above. Some of these infarcts are positioned in a somewhat watershed fashion.Associated faint petechial hemorrhage without hemorrhagic transformation or mass effect.  MRA head - not performed  CTA H&N -  Severe stenosis LEFT M1 origin, possible chronic thromboembolism.   Carotid Doppler - CTA neck  2D Echo - normal EF no cardiac source of embolism  LDL - 107  HgbA1c - 6.4  VTE prophylaxis - SCDs Check puncture sites for bleeding or hematomas. Bleeding precautions Fall precautions DIET - DYS 1 Room service appropriate? Yes; Fluid consistency: Nectar Thick  No antithrombotic prior to admission, now on  aspirin post TPA.  Patient will be counseled to be compliant with her antithrombotic medications  Ongoing aggressive stroke risk factor management  Therapy recommendations:  CLR  Disposition:  Pending  Hypertension  Stable  Permissive hypertension (OK if < 220/120) but gradually normalize in 5-7 days  Long-term BP goal normotensive  Hyperlipidemia  Home meds: No lipid lowering medications prior to admission.  LDL 107, goal < 70  Add Lipitor when PO access is available.  Continue statin at discharge    Other Stroke Risk Factors  Advanced age  ETOH use, advised to drink no more than 1 drink per day.  Obesity, Body mass index is 31.03 kg/m., recommend weight loss, diet and exercise as appropriate   Hx stroke/TIA - by imaging   Other Active Problems  3.7 cm ascending aorta. Recommend annual imaging followup by CTA or MRA.  NPO - tube feedings to be discussed.   Plan / Recommendations   Stroke workup -completed Continue  Aspirin and Plavix .  Dysphagia 1 diet. Mobilize out of bed. Therapy and rehabilitation consults. Transfer patient to the floor bed today. Long discussion with her daughter   about prognosis and answered questions.   Hospital day # 4     I have personally examined this patient, reviewed notes, independently viewed imaging studies, participated in medical decision making and plan of care.ROS completed by me personally and pertinent positives fully documented  I have made any additions or clarifications directly to the above note. This patient is critically ill and at significant risk of neurological worsening, death and care requires constant monitoring of vital signs, hemodynamics,respiratory and cardiac monitoring, extensive review of multiple databases, frequent neurological assessment, discussion with family, other specialists and medical decision making of high complexity.I have made any additions or clarifications directly to the above note.This  critical care time does not reflect procedure time, or teaching time or supervisory time of PA/NP/Med Resident etc but could involve care discussion time.  I spent 32 minutes of neurocritical care time  in the care of  this patient.      Antony Contras, MD Medical Director [place] Pager: [phone] 04/10/2017 2:52 PM   To contact Stroke Continuity provider, please refer to [email or web address] After hours, contact General Neurology

## 2017-04-11 ENCOUNTER — Encounter (HOSPITAL_COMMUNITY): Payer: Self-pay | Admitting: *Deleted

## 2017-04-11 ENCOUNTER — Inpatient Hospital Stay (HOSPITAL_COMMUNITY)
Admission: RE | Admit: 2017-04-11 | Discharge: 2017-05-08 | DRG: 057 | Disposition: A | Discharge status: Skilled Nursing Facility | Payer: Medicare Other | Source: Intra-hospital | Attending: Physical Medicine & Rehabilitation | Admitting: Physical Medicine & Rehabilitation

## 2017-04-11 DIAGNOSIS — G8191 Hemiplegia, unspecified affecting right dominant side: Secondary | ICD-10-CM | POA: Diagnosis not present

## 2017-04-11 DIAGNOSIS — Z79899 Other long term (current) drug therapy: Secondary | ICD-10-CM | POA: Diagnosis not present

## 2017-04-11 DIAGNOSIS — F329 Major depressive disorder, single episode, unspecified: Secondary | ICD-10-CM | POA: Diagnosis present

## 2017-04-11 DIAGNOSIS — R1310 Dysphagia, unspecified: Secondary | ICD-10-CM | POA: Diagnosis present

## 2017-04-11 DIAGNOSIS — Z9049 Acquired absence of other specified parts of digestive tract: Secondary | ICD-10-CM

## 2017-04-11 DIAGNOSIS — R490 Dysphonia: Secondary | ICD-10-CM | POA: Diagnosis present

## 2017-04-11 DIAGNOSIS — Z881 Allergy status to other antibiotic agents status: Secondary | ICD-10-CM

## 2017-04-11 DIAGNOSIS — E871 Hypo-osmolality and hyponatremia: Secondary | ICD-10-CM | POA: Diagnosis not present

## 2017-04-11 DIAGNOSIS — I63512 Cerebral infarction due to unspecified occlusion or stenosis of left middle cerebral artery: Secondary | ICD-10-CM | POA: Diagnosis present

## 2017-04-11 DIAGNOSIS — R252 Cramp and spasm: Secondary | ICD-10-CM | POA: Diagnosis not present

## 2017-04-11 DIAGNOSIS — R740 Nonspecific elevation of levels of transaminase and lactic acid dehydrogenase [LDH]: Secondary | ICD-10-CM | POA: Diagnosis present

## 2017-04-11 DIAGNOSIS — I639 Cerebral infarction, unspecified: Secondary | ICD-10-CM

## 2017-04-11 DIAGNOSIS — I69320 Aphasia following cerebral infarction: Secondary | ICD-10-CM

## 2017-04-11 DIAGNOSIS — R0989 Other specified symptoms and signs involving the circulatory and respiratory systems: Secondary | ICD-10-CM

## 2017-04-11 DIAGNOSIS — Z8249 Family history of ischemic heart disease and other diseases of the circulatory system: Secondary | ICD-10-CM

## 2017-04-11 DIAGNOSIS — R799 Abnormal finding of blood chemistry, unspecified: Secondary | ICD-10-CM | POA: Diagnosis not present

## 2017-04-11 DIAGNOSIS — I10 Essential (primary) hypertension: Secondary | ICD-10-CM | POA: Diagnosis present

## 2017-04-11 DIAGNOSIS — I69391 Dysphagia following cerebral infarction: Secondary | ICD-10-CM | POA: Diagnosis not present

## 2017-04-11 DIAGNOSIS — E876 Hypokalemia: Secondary | ICD-10-CM | POA: Diagnosis not present

## 2017-04-11 DIAGNOSIS — I69351 Hemiplegia and hemiparesis following cerebral infarction affecting right dominant side: Principal | ICD-10-CM

## 2017-04-11 DIAGNOSIS — R7303 Prediabetes: Secondary | ICD-10-CM | POA: Diagnosis present

## 2017-04-11 DIAGNOSIS — F331 Major depressive disorder, recurrent, moderate: Secondary | ICD-10-CM | POA: Diagnosis not present

## 2017-04-11 DIAGNOSIS — D72829 Elevated white blood cell count, unspecified: Secondary | ICD-10-CM | POA: Diagnosis present

## 2017-04-11 DIAGNOSIS — F32A Depression, unspecified: Secondary | ICD-10-CM | POA: Diagnosis present

## 2017-04-11 DIAGNOSIS — R5383 Other fatigue: Secondary | ICD-10-CM | POA: Diagnosis not present

## 2017-04-11 DIAGNOSIS — I169 Hypertensive crisis, unspecified: Secondary | ICD-10-CM | POA: Diagnosis not present

## 2017-04-11 DIAGNOSIS — Z87442 Personal history of urinary calculi: Secondary | ICD-10-CM

## 2017-04-11 MED ORDER — ACETAMINOPHEN 650 MG RE SUPP: 650.0000 mg | RECTAL | Status: DC | PRN

## 2017-04-11 MED ORDER — ONDANSETRON HCL 4 MG PO TABS: 4.0000 mg | ORAL_TABLET | Freq: Four times a day (QID) | ORAL | Status: DC | PRN

## 2017-04-11 MED ORDER — VENLAFAXINE HCL ER 75 MG PO CP24
75.0000 mg | ORAL_CAPSULE | Freq: Every day | ORAL | Status: DC
  Administered 2017-04-12 – 2017-05-08 (×27): 75 mg via ORAL
  Filled 2017-04-11 (×27): qty 1

## 2017-04-11 MED ORDER — CLOPIDOGREL BISULFATE 75 MG PO TABS
75.0000 mg | ORAL_TABLET | Freq: Every day | ORAL | Status: DC
  Administered 2017-04-12 – 2017-05-08 (×27): 75 mg via ORAL
  Filled 2017-04-11 (×27): qty 1

## 2017-04-11 MED ORDER — ASPIRIN EC 81 MG PO TBEC
81.0000 mg | DELAYED_RELEASE_TABLET | Freq: Every day | ORAL | Status: DC
  Administered 2017-04-12: 81 mg via ORAL
  Filled 2017-04-11 (×2): qty 1

## 2017-04-11 MED ORDER — PANTOPRAZOLE SODIUM 40 MG PO PACK
40.0000 mg | PACK | Freq: Every day | ORAL | Status: DC
  Administered 2017-04-12 – 2017-04-23 (×11): 40 mg
  Filled 2017-04-11 (×12): qty 20

## 2017-04-11 MED ORDER — CLONIDINE HCL 0.1 MG PO TABS
0.1000 mg | ORAL_TABLET | Freq: Four times a day (QID) | ORAL | Status: DC | PRN
  Administered 2017-04-11: 0.1 mg via ORAL
  Filled 2017-04-11: qty 1

## 2017-04-11 MED ORDER — PANTOPRAZOLE SODIUM 40 MG PO TBEC: 40.0000 mg | DELAYED_RELEASE_TABLET | Freq: Every day | ORAL | Status: DC

## 2017-04-11 MED ORDER — SORBITOL 70 % SOLN: 30.0000 mL | Freq: Every day | Status: DC | PRN

## 2017-04-11 MED ORDER — ACETAMINOPHEN 325 MG PO TABS: 650.0000 mg | ORAL_TABLET | ORAL | Status: DC | PRN

## 2017-04-11 MED ORDER — ATORVASTATIN CALCIUM 10 MG PO TABS: 10.0000 mg | ORAL_TABLET | Freq: Every day | ORAL | Status: DC

## 2017-04-11 MED ORDER — HYDROCHLOROTHIAZIDE 12.5 MG PO CAPS
12.5000 mg | ORAL_CAPSULE | Freq: Every day | ORAL | Status: DC
Start: 2017-04-12 — End: 2017-04-13
  Administered 2017-04-12: 12.5 mg via ORAL
  Filled 2017-04-11 (×2): qty 1

## 2017-04-11 MED ORDER — ONDANSETRON HCL 4 MG/2ML IJ SOLN: 4.0000 mg | Freq: Four times a day (QID) | INTRAMUSCULAR | Status: DC | PRN

## 2017-04-11 MED ORDER — ACETAMINOPHEN 160 MG/5ML PO SOLN: 650.0000 mg | ORAL | Status: DC | PRN

## 2017-04-11 MED ORDER — IRBESARTAN 300 MG PO TABS
300.0000 mg | ORAL_TABLET | Freq: Every day | ORAL | Status: DC
Start: 2017-04-12 — End: 2017-05-08
  Administered 2017-04-12 – 2017-05-08 (×27): 300 mg via ORAL
  Filled 2017-04-11 (×27): qty 1

## 2017-04-11 MED ORDER — SENNOSIDES-DOCUSATE SODIUM 8.6-50 MG PO TABS
1.0000 | ORAL_TABLET | Freq: Every evening | ORAL | Status: DC | PRN
  Administered 2017-04-13: 1 via ORAL
  Filled 2017-04-11: qty 1

## 2017-04-11 MED ORDER — RESOURCE THICKENUP CLEAR PO POWD
ORAL | Status: DC | PRN
  Filled 2017-04-11: qty 125

## 2017-04-11 MED ORDER — ALPRAZOLAM 0.25 MG PO TABS
0.5000 mg | ORAL_TABLET | Freq: Three times a day (TID) | ORAL | Status: DC | PRN
  Administered 2017-04-19: 0.5 mg
  Filled 2017-04-11: qty 2

## 2017-04-11 MED ORDER — ATORVASTATIN CALCIUM 10 MG PO TABS
10.0000 mg | ORAL_TABLET | Freq: Every day | ORAL | Status: DC
Start: 2017-04-11 — End: 2017-06-22

## 2017-04-11 NOTE — Progress Notes (Signed)
Received pt. As an admission.Family was oriented to rehab routine and protocol.Safety plan was explain.Fall prevention plan was explained and signed.

## 2017-04-11 NOTE — IPOC Note (Signed)
Overall Plan of Care [facility]) Patient Details Name: Ebony Williams MRN: [id number] DOB: 1950/07/07  Admitting Diagnosis: Left MCA infarct  Hospital Problems: Active Problems:   Left middle cerebral artery stroke ([place])   Transaminitis   Essential hypertension   Right hemiplegia ([facility])   Hypertensive crisis     Functional Problem List: Nursing Bladder, Bowel, Endurance, Motor, Nutrition, Safety, Skin Integrity  PT Balance, Endurance, Motor, Safety, Perception, Sensory  OT Balance, Cognition, Endurance, Motor, Perception, Safety, Sensory, Skin Integrity, Vision  SLP Nutrition, Cognition, Linguistic  TR         Basic ADL's: OT Eating, Grooming, Bathing, Dressing, Toileting     Advanced  ADL's: OT       Transfers: PT Bed Mobility, Bed to Chair, Car, [employer], Futures trader, Metallurgist: PT Ambulation, Emergency planning/management officer, Stairs     Additional Impairments: OT Fuctional Use of Upper Extremity  SLP Swallowing, Communication, Social Cognition comprehension, expression Attention, Awareness  TR      Anticipated Outcomes Item Anticipated Outcome  Self Feeding Min assist  Swallowing  Supervision    Basic self-care  Min assist  Toileting  Min assist   Bathroom Transfers Min assist  Bowel/Bladder  Continent to bowel and bladder with mod. assist.  Transfers  SBA  Locomotion  SBA with LRAD  Communication  Min assist   Cognition  Min assist   Pain  Less than 3 on 1 to 10 scale.  Safety/Judgment  Free from falls during her stay in rehab.   Therapy Plan: PT Intensity: Minimum of 1-2 x/day ,45 to 90 minutes PT Frequency: 5 out of 7 days PT Duration Estimated Length of Stay: 18-21 days OT Intensity: Minimum of 1-2 x/day, 45 to 90 minutes OT Frequency: 5 out of 7 days OT Duration/Estimated Length of Stay: 21-24 days SLP Intensity: Minumum of 1-2 x/day, 30 to 90 minutes SLP Frequency: 3 to 5 out of 7 days SLP Duration/Estimated Length of Stay:  21 days     Team Interventions: Nursing Interventions Pain Management, Patient/Family Education, Bladder Management, Bowel Management, Disease Management/Prevention  PT interventions Ambulation/gait training, Balance/vestibular training, Cognitive remediation/compensation, Community reintegration, Discharge planning, DME/adaptive equipment instruction, Functional electrical stimulation, Functional mobility training, Neuromuscular re-education, Patient/family education, Psychosocial support, Splinting/orthotics, Stair training, Therapeutic Activities, Therapeutic Exercise, UE/LE Strength taining/ROM, UE/LE Coordination activities, Visual/perceptual remediation/compensation, Wheelchair propulsion/positioning  OT Interventions Balance/vestibular training, Cognitive remediation/compensation, Discharge planning, DME/adaptive equipment instruction, Disease mangement/prevention, Functional electrical stimulation, Functional mobility training, Neuromuscular re-education, Pain management, Patient/family education, Psychosocial support, Self Care/advanced ADL retraining, Skin care/wound managment, Splinting/orthotics, Therapeutic Activities, Therapeutic Exercise, UE/LE Strength taining/ROM, UE/LE Coordination activities, Visual/perceptual remediation/compensation, Wheelchair propulsion/positioning  SLP Interventions Cognitive remediation/compensation, Cueing hierarchy, Dysphagia/aspiration precaution training, Environmental controls, Internal/external aids, Multimodal communication approach, Speech/Language facilitation, Patient/family education, Functional tasks  TR Interventions    SW/CM Interventions Discharge Planning, Psychosocial Support, Patient/Family Education   Barriers to Discharge MD  Medical stability  Nursing      PT Decreased caregiver support, Home environment access/layout    OT Incontinence, Inaccessible home environment, Decreased caregiver support husband works but reports he has sick  leave he can use  SLP      SW       Team Discharge Planning: Destination: PT-Home ,OT- Home , SLP-Home Projected Follow-up: PT-Home health PT, 24 hour supervision/assistance, OT-  24 hour supervision/assistance, Home health OT, SLP-Home Health SLP, Outpatient SLP, 24 hour supervision/assistance Projected Equipment Needs: PT-To be determined, OT- 3 in 1 bedside comode, To  be determined, SLP-To be determined Equipment Details: PT- , OT-  Patient/family involved in discharge planning: PT- Patient unable/family or caregiver not available,  OT-Family member/caregiver, SLP-Patient, Family member/caregiver  MD ELOS: 18-21 days. Medical Rehab Prognosis:  Good Assessment:  67 y.o. right handed female with history of hypertension.  Presented 04/06/2017 with right sided weakness, left gaze and aphasia. CT head reviewed, showing left basal ganglia infarct.  Per report, subacute left-sided basal ganglia nonhemorrhagic infarction. Patient did receive TPA. CT angiogram head and neck with no hemodynamically significant stenosis or acute vascular process. 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 Echocardiogram with ejection fraction of 41% grade 1 diastolic dysfunction without embolism. Neurology consulted presently on aspirin as well as Plavix for CVA prophylaxis. Dysphagia #1 nectar thick liquid diet. Patient with resulting functional deficits with mobility, safety, speech, cognition, self-care.  Will set goals for Min A for most tasks with PT/OT/SLP.  See Team Conference Notes for weekly updates to the plan of care

## 2017-04-11 NOTE — Care Management Important Message (Signed)
Important Message  Patient Details  Name: Ebony Williams MRN: [id number] Date of Birth: February 05, 1951   Medicare Important Message Given:  Yes    Abrish Erny 04/11/2017, 2:18 PM

## 2017-04-11 NOTE — Progress Notes (Signed)
[name], MD  Physician  Physical Medicine and Rehabilitation  Consult Note  Signed  Date of Service:  04/09/2017 3:18 PM       Related encounter: ED to Hosp-Admission (Current) from 04/06/2017 in [facility]      Signed      Expand All Collapse All      [] Hide copied text  [] Hover for details        Physical Medicine and Rehabilitation Consult Reason for Consult: Right side weakness Referring Physician: Dr. Leonie Man   HPI: Ebony Williams is a 67 y.o. right handed female with history of hypertension. Per chart review and husband, patient lives with spouse. Reported to be independent prior to admission. 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 One level home with 3 steps to entry. Family in the area question 24 hour assistance. Presented 04/06/2017 with right sided weakness, left gaze and aphasia. CT head reviewed, showing left basal ganglia infarct.  Per report, subacute left-sided basal ganglia nonhemorrhagic infarction. Patient did receive TPA. CT angiogram head and neck with no hemodynamically significant stenosis or acute vascular process. There was noted left M1 stenosis. Felt to be surgical candidate. MRI showed acute multifocal ischemic left MCA and left MCA/PCA infarcts. Echocardiogram with ejection fraction of 81% grade 1 diastolic dysfunction. Neurology consulted presently on aspirin for CVA prophylaxis. Dysphagia #1 nectar thick liquid diet. Physical occupational therapy evaluations completed 04/09/2017 with recommendations of physical medicine rehabilitation consult.  Review of Systems  Unable to perform ROS: Medical condition       Past Medical History:  Diagnosis Date  . History of kidney stones    x1  . Hypertension         Past Surgical History:  Procedure Laterality Date  . CHOLECYSTECTOMY     laparoscopic  . COLONOSCOPY WITH PROPOFOL N/A 01/12/2015   Procedure: COLONOSCOPY WITH PROPOFOL;  Surgeon: Garlan Fair, MD;  Location: [facility];  Service: Endoscopy;  Laterality: N/A;  . TUBAL LIGATION          Family History  Problem Relation Age of Onset  . Diabetes Mother   . Hypertension Mother    Social History:  reports that  has never smoked. she has never used smokeless tobacco. She reports that she drinks alcohol. She reports that she does not use drugs. Allergies:      Allergies  Allergen Reactions  . Sulfa Antibiotics Diarrhea and Itching         Medications Prior to Admission  Medication Sig Dispense Refill  . ALPRAZolam (XANAX) 0.25 MG tablet Take 0.25 mg by mouth at bedtime as needed for anxiety or sleep.     [name] ibuprofen (ADVIL,MOTRIN) 200 MG tablet Take 200-600 mg by mouth every 6 (six) hours as needed for mild pain.     . valsartan-hydrochlorothiazide (DIOVAN-HCT) 320-12.5 MG tablet Take 1 tablet by mouth daily.    [name] venlafaxine XR (EFFEXOR-XR) 150 MG 24 hr capsule Take 150 mg by mouth daily with breakfast.    . zolpidem (AMBIEN) 10 MG tablet Take 10 mg by mouth at bedtime as needed for sleep.     [name] ALPRAZolam (XANAX) 0.5 MG tablet Take 0.25-0.5 mg by mouth every 4 (four) hours as needed. Insomnia or anxiety    . venlafaxine XR (EFFEXOR-XR) 75 MG 24 hr capsule Take 75 mg by mouth daily.      Home: Home Living Family/patient expects to be discharged to:: Private residence Living Arrangements: Spouse/significant other  Available Help at Discharge: Family, Available 24 hours/day Type of Home: House Home Access: Stairs to enter [employer] of Steps: 3 Home Layout: One level Bathroom Shower/Tub: Multimedia programmer: Standard Home Equipment: Shower seat Additional Comments: pt is caregiver for spouse, 21yo nephew and 55mo granddaughter  Functional History: Prior Function Level of Independence: Independent Functional Status:  Mobility: Bed Mobility Overal bed mobility: Needs Assistance Bed Mobility: Supine to Sit Sit to  supine: Mod assist General bed mobility comments: assist to move Rt LE off bed, and assist to lift trunk, pt utilizing LUE to help push off of bed Transfers Overall transfer level: Needs assistance Equipment used: None Transfers: Sit to/from Stand, Stand Pivot Transfers Sit to Stand: Mod assist, +2 physical assistance, +2 safety/equipment Stand pivot transfers: Mod assist, +2 physical assistance, +2 safety/equipment General transfer comment: Pt initially with buckling of Rt knee.  SHe was able to minimally activate Rt LE during weight shift and transfer with hyperextension of Rt knee noted. Pt with heavy reliance on LLE with ability to scoot foot to left for pivot to chair with bil UE support  ADL: ADL Overall ADL's : Needs assistance/impaired Eating/Feeding: Moderate assistance, Sitting Grooming: Brushing hair, Minimal assistance, Sitting Grooming Details (indicate cue type and reason): assist to initiate task.  Pt initially moved comb to cheek then her mouth.  Once she was able to initiate combing her hair, she was able to complete task - she did attend to left side of head  Upper Body Bathing: Maximal assistance, Sitting Lower Body Bathing: Total assistance, Sit to/from stand Upper Body Dressing : Sitting, Total assistance Lower Body Dressing: Total assistance, Sit to/from stand Toilet Transfer: Moderate assistance, +2 for safety/equipment, +2 for physical assistance, Stand-pivot, BSC Toileting- Clothing Manipulation and Hygiene: Total assistance, Sit to/from stand Functional mobility during ADLs: Moderate assistance, +2 for physical assistance, +2 for safety/equipment General ADL Comments: Pt with Rt hemiplegia and Rt neglect/inattention   Cognition: Cognition Overall Cognitive Status: Difficult to assess Orientation Level: (UTA, patient non verbal) Cognition Arousal/Alertness: Awake/alert Behavior During Therapy: Flat affect Overall Cognitive Status: Difficult to assess Area  of Impairment: Following commands Following Commands: Follows one step commands inconsistently, Follows one step commands with increased time General Comments: pt unable to verbalize Difficult to assess due to: Impaired communication  Blood pressure (!) 156/78, pulse 89, temperature 98.3 F (36.8 C), temperature source Axillary, resp. rate (!) 21, height 5\' 1"  (1.549 m), weight 74.5 kg (164 lb 3.9 oz), SpO2 92 %. Physical Exam  Vitals reviewed. Constitutional: She appears well-developed.  Obese  HENT:  Head: Normocephalic and atraumatic.  Eyes: Right eye exhibits no discharge. Left eye exhibits no discharge.  Pupils reactive to light  Neck: Normal range of motion. Neck supple. No thyromegaly present.  Cardiovascular: Normal rate, regular rhythm and normal heart sounds.  Respiratory: Effort normal and breath sounds normal. No respiratory distress.  GI: Soft. Bowel sounds are normal. She exhibits no distension.  Musculoskeletal:  No edema LE  Neurological: She is alert.  Patient globally aphasic.  Unarousable Patient does withdrawal to painful stimuli LLE, but not RLE  Skin: Skin is warm and dry.  Psychiatric:  Unable to assess due to mentation    LabResultsLast24Hours  Results for orders placed or performed during the hospital encounter of 04/06/17 (from the past 24 hour(s))  Glucose, capillary     Status: Abnormal   Collection Time: 04/08/17  3:53 PM  Result Value Ref Range   Glucose-Capillary 126 (H) 65 -  99 mg/dL  Glucose, capillary     Status: Abnormal   Collection Time: 04/08/17  7:45 PM  Result Value Ref Range   Glucose-Capillary 133 (H) 65 - 99 mg/dL  Glucose, capillary     Status: Abnormal   Collection Time: 04/09/17  8:20 AM  Result Value Ref Range   Glucose-Capillary 129 (H) 65 - 99 mg/dL      ImagingResults(Last48hours)  Mr Brain Wo Contrast  Result Date: 04/07/2017 CLINICAL DATA:  Initial evaluation for acute stroke, status post tPA.  EXAM: MRI HEAD WITHOUT CONTRAST TECHNIQUE: Multiplanar, multiecho pulse sequences of the brain and surrounding structures were obtained without intravenous contrast. COMPARISON:  Prior CT from earlier the same day as well as prior CTs and CTAs from 04/06/2017. FINDINGS: Brain: Study mildly degraded by motion artifact. Diffuse prominence of the CSF containing spaces compatible with generalized cerebral atrophy. Patchy and confluent T2/FLAIR hyperintensity within the periventricular and deep white matter both cerebral hemispheres most consistent with chronic small vessel ischemic disease, moderate in nature. Abnormal restricted diffusion seen involving the left basal ganglia, compatible with acute left MCA territory infarct. There are overlying cortical and subcortical infarcts extending in a somewhat linear distribution involving the left fronto, parietal, and occipital lobes, involving the left MCA territory and left MCA/PCA border zone. These are watershed in appearance. Faint petechial hemorrhage about a few areas of infarction without hemorrhagic transformation (series 7, image 19). No significant mass effect. No mass lesion, midline shift or mass effect. No hydrocephalus. No extra-axial fluid collection. Major dural sinuses are grossly patent. Pituitary gland suprasellar region normal. Midline structures intact. Vascular: Major intravascular flow voids are maintained. Skull and upper cervical spine: Craniocervical junction normal. Upper cervical spine within normal limits. Bone marrow signal intensity within normal limits. No scalp soft tissue abnormality. Sinuses/Orbits: Globes and orbital soft tissues within normal limits. Extensive mucosal thickening throughout the paranasal sinuses, greatest within the maxillary sinuses. No mastoid effusion. Middle ear structures grossly normal. Other: None IMPRESSION: 1. Acute multifocal ischemic left MCA and left MCA/PCA border zone infarcts involving the left cerebral  hemisphere as above. Some of these infarcts are positioned in a somewhat watershed fashion. Associated faint petechial hemorrhage without hemorrhagic transformation or mass effect. 2. Underlying moderate chronic small vessel ischemic disease. 3. Pan sinusitis. Electronically Signed   By: Jeannine Boga M.D.   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  Records reviewed and summated above. Stroke: Continue secondary stroke prophylaxis and Risk Factor Modification listed below:   Antiplatelet therapy:   Blood Pressure Management:  Continue current medication with prn's with permisive HTN per primary team Statin Agent:   Prediabetes management:   ?Left sided hemiparesis: fit for orthosis to prevent contractures (resting hand splint for day, wrist cock up splint at night, PRAFO, etc) Motor recovery: Fluoxetine  1. Does the need for close, 24 hr/day medical supervision in concert with the patient's rehab needs make it unreasonable for this patient to be served in a less intensive setting? Yes  2. Co-Morbidities requiring supervision/potential complications: post-stroke Dysphagia (advance diet as tolerated), diastolic dysfunction (monitor for signs/symptoms of fluid overload), HTN (monitor and provide prns in accordance with increased physical exertion and pain), tachypnea (monitor RR and O2 Sats with increased physical exertion), prediabetes (Monitor in accordance with exercise and adjust meds as necessary), leukocytosis (cont to monitor for signs and symptoms of infection, further workup if indicated) 3. Due to  bladder management, bowel management, safety, skin/wound care, disease management, medication administration, pain management and patient education, does the patient require 24 hr/day rehab nursing? Yes 4. Does the patient require coordinated care of a physician, rehab nurse, PT (1-2 hrs/day,  5 days/week), OT (1-2 hrs/day, 5 days/week) and SLP (1-2 hrs/day, 5 days/week) to address physical and functional deficits in the context of the above medical diagnosis(es)? Yes Addressing deficits in the following areas: balance, endurance, locomotion, strength, transferring, bowel/bladder control, bathing, dressing, feeding, grooming, toileting, cognition, speech, language, swallowing and psychosocial support 5. Can the patient actively participate in an intensive therapy program of at least 3 hrs of therapy per day at least 5 days per week? Potentially 6. The potential for patient to make measurable gains while on inpatient rehab is excellent 7. Anticipated functional outcomes upon discharge from inpatient rehab are min assist  with PT, min assist with OT, min assist and mod assist with SLP. 8. Estimated rehab length of stay to reach the above functional goals is: 19-24 days. 9. Anticipated D/C setting: Home 10. Anticipated post D/C treatments: HH therapy and Home excercise program 11. Overall Rehab/Functional Prognosis: good  RECOMMENDATIONS: This patient's condition is appropriate for continued rehabilitative care in the following setting: CIR once medically appropriate and able to participate in 3 hours of therapy/day. Patient has agreed to participate in recommended program. Potentially Note that insurance prior authorization may be required for reimbursement for recommended care.  Comment: Rehab Admissions Coordinator to follow up.  Delice Lesch, MD, ABPMR Lavon Paganini Angiulli, PA-C 04/09/2017          Revision History                   Routing History

## 2017-04-11 NOTE — PMR Pre-admission (Signed)
PMR Admission Coordinator Pre-Admission Assessment  Patient: Ebony Williams is an 67 y.o., female MRN: [id number] DOB: [birth year] Height: 5\' 1"  (154.9 cm) Weight: 74.5 kg (164 lb 3.9 oz)              Insurance Information HMO:     PPO: yes     PCP:      IPA:      80/20:      OTHER: medicare advantage plan PRIMARY: [employer] Medicare      Policy#: [id number]      Subscriber: pt CM Name: Orvan July      Phone#: [phone]     Fax#: [phone] Pre-Cert#: [id number]  F/u withJill phone [phone] fax [phone] in 7 days    Employer: retired Benefits:  Phone #: [phone]     Name: 04/10/17 Eff. Date: 02/06/2017     Deduct: none      Out of Pocket Max: $4000      Life Max: none CIR: $160 co pay per day days 1 until 10      SNF: no co pay days 1 to 20; $50 co pay per day days 21- 100 Outpatient: $20 co pay per visit     Co-Pay: visits per medical neccesity Home Health: 100%      Co-Pay: visits per medical neccesity DME: 80%     Co-Pay: 20% Providers: in network  SECONDARY: none        Medicaid Application Date:       Case Manager:  Disability Application Date:       Case Worker:   Emergency Facilities manager Information    Name Relation Home Work Mobile   [place] Spouse [phone]     Tuck,Carrie Daughter [phone]       Current Medical History  Patient Admitting Diagnosis: 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  Presented 04/06/2017 with right sided weakness, left gaze and aphasia. CT head reviewed, showing left basal ganglia infarct. Per report, subacute left-sided basal ganglia nonhemorrhagic infarction. Patient did receive TPA. CT angiogram head and neck with no hemodynamically significant stenosis or acute vascular process. 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 Echocardiogram with ejection fraction of 63% grade 1 diastolic dysfunction without embolism. Neurology consulted presently on aspirin as well as Plavix for CVA prophylaxis. Dysphagia #1 nectar thick liquid diet.   Total: 20 NIHSS  Past Medical History  Past Medical History:  Diagnosis Date  . History of kidney stones    x1  . Hypertension     Family History  family history includes Diabetes in her mother; Hypertension in her mother.  Prior Rehab/Hospitalizations:  Has the patient had major surgery during 100 days prior to admission? No  Current Medications   Current Facility-Administered Medications:  .  [COMPLETED] alteplase (ACTIVASE) 1 mg/mL infusion 71 mg, 0.9 mg/kg, Intravenous, Once, Stopped at 04/06/17 1856 **FOLLOWED BY** 0.9 %  sodium chloride infusion, 50 mL, Intravenous, Once, Nat Christen, MD .  acetaminophen (TYLENOL) tablet 650 mg, 650 mg, Oral, Q4H PRN **OR** acetaminophen (TYLENOL) solution 650 mg, 650 mg, Per Tube, Q4H PRN **OR** acetaminophen (TYLENOL) suppository 650 mg, 650 mg, Rectal, Q4H PRN, Kerney Elbe, MD .  ALPRAZolam Duanne Moron) tablet 0.5 mg, 0.5 mg, Per Tube, TID PRN, Mikey Bussing  L, PA-C .  aspirin EC tablet 81 mg, 81 mg, Oral, Daily, Garvin Fila, MD, 81 mg at 04/11/17 1126 .  chlorhexidine (PERIDEX) 0.12 % solution 15 mL, 15 mL, Mouth Rinse, BID, Garvin Fila, MD, 15 mL at 04/11/17 1125 .  clopidogrel (PLAVIX) tablet 75 mg, 75 mg, Oral, Daily, Leonie Man, Pramod S, MD, 75 mg at 04/11/17 1123 .  hydrALAZINE (APRESOLINE) injection 20 mg, 20 mg, Intravenous, Q4H PRN, Rinehuls, David L, PA-C, 20 mg at 04/09/17 2000 .  hydrochlorothiazide (MICROZIDE) capsule 12.5 mg, 12.5 mg, Oral, Daily, Rinehuls, David L, PA-C, 12.5 mg at 04/11/17 1121 .  irbesartan (AVAPRO) tablet 300 mg, 300 mg, Oral, Daily, Garvin Fila, MD, 300 mg at 04/11/17 1123 .  labetalol (NORMODYNE,TRANDATE) injection 20 mg,  20 mg, Intravenous, Q2H PRN, Rinehuls, David L, PA-C, 20 mg at 04/11/17 0826 .  MEDLINE mouth rinse, 15 mL, Mouth Rinse, q12n4p, Garvin Fila, MD, 15 mL at 04/10/17 1503 .  pantoprazole (PROTONIX) EC tablet 40 mg, 40 mg, Oral, QHS, Sethi, Lucy Antig[place], MD .  RESOURCE THICKENUP CLEAR, , Oral, PRN, Garvin Fila, MD .  senna-docusate (Senokot-S) tablet 1 tablet, 1 tablet, Oral, QHS PRN, Kerney Elbe, MD .  venlafaxine XR (EFFEXOR-XR) 24 hr capsule 75 mg, 75 mg, Oral, Daily, Kerney Elbe, MD, 75 mg at 04/11/17 1119  Patients Current Diet: Check puncture sites for bleeding or hematomas. Fall precautions DIET - DYS 1 Room service appropriate? Yes; Fluid consistency: Nectar Thick  Precautions / Restrictions Precautions Precautions: Fall Precaution Comments: right hemiplegia and neglect Restrictions Weight Bearing Restrictions: No   Has the patient had 2 or more falls or a fall with injury in the past year?No  Prior Activity Level Community (5-7x/wk): Independent and driving pta; cared for 67 year old nephew pta  Development worker, international aid / [place] Devices/Equipment: None Home Equipment: Shower seat  Prior Device Use: Indicate devices/aids used by the patient prior to current illness, exacerbation or injury? None of the above  Prior Functional Level Prior Function Level of Independence: Independent  Self Care: Did the patient need help bathing, dressing, using the toilet or eating?  Independent  Indoor Mobility: Did the patient need assistance with walking from room to room (with or without device)? Independent  Stairs: Did the patient need assistance with internal or external stairs (with or without device)? Independent  Functional Cognition: Did the patient need help planning regular tasks such as shopping or remembering to take medications? Independent  Current Functional Level Cognition  Arousal/Alertness: Awake/alert Overall Cognitive Status:  Impaired/Different from baseline Difficult to assess due to: Impaired communication Orientation Level: Oriented to person, Oriented to place Following Commands: Follows one step commands inconsistently, Follows one step commands with increased time General Comments: pt able to state her name and responded "good morning" when same stated to her. No other response to questions Attention: Focused, Sustained Focused Attention: Appears intact Sustained Attention: Appears intact Memory: (unable to assess) Awareness: Impaired Awareness Impairment: Emergent impairment Problem Solving: Impaired Problem Solving Impairment: Verbal basic, Verbal complex Safety/Judgment: Appears intact    Extremity Assessment (includes Sensation/Coordination)  Upper Extremity Assessment: Defer to OT evaluation RUE Deficits / Details: Pt with no active movement Rt UE.  Extensor tone presents.  Pt with Rt neglect  RUE Sensation: decreased light touch, decreased proprioception RUE Coordination: decreased fine motor, decreased gross motor  Lower Extremity Assessment: RLE deficits/detail RLE Deficits / Details: no AROM, no response to painful stimuli, pt with knee  hyperextension in standing    ADLs  Overall ADL's : Needs assistance/impaired Eating/Feeding: Moderate assistance, Sitting Grooming: Brushing hair, Minimal assistance, Sitting Grooming Details (indicate cue type and reason): assist to initiate task.  Pt initially moved comb to cheek then her mouth.  Once she was able to initiate combing her hair, she was able to complete task - she did attend to left side of head  Upper Body Bathing: Maximal assistance, Sitting Lower Body Bathing: Total assistance, Sit to/from stand Upper Body Dressing : Sitting, Total assistance Lower Body Dressing: Total assistance, Sit to/from stand Toilet Transfer: Moderate assistance, +2 for safety/equipment, +2 for physical assistance, Stand-pivot, BSC Toileting- Clothing Manipulation  and Hygiene: Total assistance, Sit to/from stand Functional mobility during ADLs: Moderate assistance, +2 for physical assistance, +2 for safety/equipment General ADL Comments: Pt with Rt hemiplegia and Rt neglect/inattention     Mobility  Overal bed mobility: Needs Assistance Bed Mobility: Supine to Sit Supine to sit: Mod assist Sit to supine: Mod assist General bed mobility comments: assist to move Rt LE off bed, and assist to lift trunk, pt utilizing LUE to help push off of bed along with rail     Transfers  Overall transfer level: Needs assistance Equipment used: None Transfers: Sit to/from Stand, Stand Pivot Transfers Sit to Stand: Mod assist, +2 physical assistance, +2 safety/equipment Stand pivot transfers: Mod assist, +2 physical assistance, +2 safety/equipment General transfer comment: pt initially with weight on right toes with assist to place heel on ground pt hyperextends knee and flexes at hip to tolerate weight on RLE with pt standing and scooting RLE to pivot to chair with bil UE supported. Performed sit to stand x 2 trials with inability to position RLE out of hyperextension in standing    Ambulation / Gait / Stairs / Wheelchair Mobility  Ambulation/Gait General Gait Details: unable    Posture / Balance Dynamic Sitting Balance Sitting balance - Comments: pt able to sit with minguard assist EOB with midline posture x 5 min with cues to attend to left and redirection to task Balance Overall balance assessment: Needs assistance Sitting-balance support: Feet supported Sitting balance-Leahy Scale: Fair Sitting balance - Comments: pt able to sit with minguard assist EOB with midline posture x 5 min with cues to attend to left and redirection to task Postural control: Posterior lean Standing balance-Leahy Scale: Poor Standing balance comment: bil UE support in standing    Special needs/care consideration BiPAP/CPAP  N/a CPM  N/a Continuous Drip IV  N/a Dialysis   N/a Life Vest  N/a Oxygen  N/a Special Bed  N/a Trach Size  N/a Wound Vac n/a Skin tear to buttocks and to right hand Bowel mgmt: incontinent LBM 3/4 Bladder mgmt:incontinent; external catheter Diabetic mgmt  N/a Has never been hospitalized except for delivery of two children   Previous Home Environment Living Arrangements: (and 69 year old nephew)  Lives With: Spouse Available Help at Discharge: (spouse and daughter and possibly hired caregivers) Type of Home: House Home Layout: One level Home Access: Stairs to enter Technical brewer of Steps: 3 Bathroom Shower/Tub: Multimedia programmer: Standard Bathroom Accessibility: Yes How Accessible: Accessible via walker Home Care Services: No Additional Comments: spouse no longer on pain meds for past few months so he is able now to assist in her care  Discharge Living Setting Plans for Discharge Living Setting: Patient's home, Lives with (comment)(spouse and 37 year old nephew) Type of Home at Discharge: House Discharge Home Layout: One level Discharge  Home Access: Stairs to enter Entrance Stairs-Rails: None Entrance Stairs-Number of Steps: 3 Discharge Bathroom Shower/Tub: Horticulturist, commercial: Standard Discharge Bathroom Accessibility: Yes How Accessible: Accessible via walker Does the patient have any problems obtaining your medications?: No  Social/Family/Support Systems Patient Roles: Spouse, Parent, Caregiver Contact Information: spouse and daughter coordinate together; spouse is daughter's stepdad Anticipated Caregiver: spouse and daughter Anticipated Caregiver's Contact Information: see above Ability/Limitations of Caregiver: spouse no limitations; daughter works during the day Caregiver Availability: 24/7 Discharge Plan Discussed with Primary Caregiver: Yes Is Caregiver In Agreement with Plan?: Yes Does Caregiver/Family have Issues with Lodging/Transportation while Pt is in Rehab?:  No   There is reports that 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 Daughter clarified that her Step Dad was having some heart issues and was on pain meds for his back in the past. ON no pain meds currently and she feels competent to be her POA and caregiver. Daughter works days and another family friend, Hoyle Sauer may help. Daughter is also discussing hired caregivers possible for about 5 hours per day. 66 year old nephew that patient was his caregiver, may go to stay with daughter to relieve stress on her Step dad.  Goals/Additional Needs Patient/Family Goal for Rehab: min assist with PT, OT, and SLP Expected length of stay: ELOS 19 to 24 days Pt/Family Agrees to Admission and willing to participate: Yes Program Orientation Provided & Reviewed with Pt/Caregiver Including Roles  & Responsibilities: Yes  Decrease burden of Care through IP rehab admission: n/a  Possible need for SNF placement upon discharge: not anticipated  Patient Condition: This patient's medical and functional status has changed since the consult dated: 04/09/2017 in which the Rehabilitation Physician determined and documented that the patient's condition is appropriate for intensive rehabilitative care in an inpatient rehabilitation facility. See "History of Present Illness" (above) for medical update. Functional changes are: max assist. Patient's medical and functional status update has been discussed with the Rehabilitation physician and patient remains appropriate for inpatient rehabilitation. Will admit to inpatient rehab today.  Preadmission Screen Completed By:  Cleatrice Burke, 04/11/2017 1:35 PM ______________________________________________________________________   Discussed status with Dr. Posey Pronto on 04/11/2017 at  1345 and received telephone approval for admission today.  Admission Coordinator:  Cleatrice Burke, time 1964 Date 04/11/2017

## 2017-04-11 NOTE — Progress Notes (Signed)
Insurance has approved an inpt rehab admit for today. I have notified Burnetta Sabin, GNP, RN CM and SW. I met with pt's spouse at bedside and he is in agreement. I will make the arrangments to admit today. [phone]

## 2017-04-11 NOTE — Progress Notes (Signed)
  Speech Language Pathology Treatment: Dysphagia  Patient Details Name: Ebony Williams MRN: [id number] DOB: 02/19/1950 Today's Date: 04/11/2017 Time: 2202-5427 SLP Time Calculation (min) (ACUTE ONLY): 35 min  Assessment / Plan / Recommendation Clinical Impression  Pt demonstrates significant improvement in automaticity with self feeding and independent range of motion of oral mechanism. Observed pt with oral care with max assist, and min assist for self feeding. Pt is clearing puree and pudding residuals with a liquid wash as there is no oral suction set up in room due to Ada. Primary concern is still intermittent episodes of sensed penetration or aspiration due to rapid intake/larger bolus. Pt eats and drinks extremely rapidly but responds to verbal and slight tactile cues to slow rate with a straw. Recommend pt continue current diet with ongoing supervision from family or staff. Recommend CIR at d/c to maximize functional gains.   HPI HPI: Ebony [place] 67 y.o.with right sided weakness, right facial droop and AMS.CT Acute suspected LEFT basal ganglia nonhemorrhagic infarct, moderate to severe chronic small vessel ischemic disease and old lacunar infarcts. CXR mild hazy left basilar airspace disease which may reflect atelectasis versus pneumonia.      SLP Plan  Continue with current plan of care       Recommendations  Diet recommendations: Nectar-thick liquid;Dysphagia 1 (puree) Liquids provided via: Cup;Straw Supervision: Full supervision/cueing for compensatory strategies;Trained caregiver to feed patient Compensations: Slow rate;Minimize environmental distractions Postural Changes and/or Swallow Maneuvers: Seated upright 90 degrees;Out of bed for meals                Oral Care Recommendations: Oral care BID Follow up Recommendations: Inpatient Rehab SLP Visit Diagnosis: Dysphagia, oral phase (R13.11) Plan: Continue with current plan of care       GO                [facility], [place] CCC-SLP [phone]  Lynann Beaver 04/11/2017, 11:39 AM

## 2017-04-11 NOTE — Discharge Summary (Addendum)
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 History of kidney stones    x1  . Hypertension    Past Surgical History:  Procedure Laterality Date  . CHOLECYSTECTOMY     laparoscopic  . COLONOSCOPY WITH PROPOFOL N/A 01/12/2015   Procedure: COLONOSCOPY WITH PROPOFOL;  Surgeon: Garlan Fair, MD;  Location: [facility];  Service: Endoscopy;  Laterality: N/A;  . TUBAL LIGATION      Medications to be continued on Rehab . aspirin EC  81 mg Oral Daily  . atorvastatin  10 mg Oral q1800  . chlorhexidine  15 mL Mouth Rinse BID  . clopidogrel  75 mg Oral Daily  . hydrochlorothiazide  12.5 mg Oral Daily  . irbesartan  300 mg Oral Daily  . mouth rinse  15 mL Mouth Rinse q12n4p  . pantoprazole  40 mg Oral QHS  . venlafaxine XR  75 mg Oral Daily    LABORATORY STUDIES CBC    Component Value Date/Time   WBC 11.5 (H) 04/06/2017 1714   RBC 5.22 (H) 04/06/2017 1714   HGB 15.6 (H) 04/06/2017 1739   HCT 46.0 04/06/2017 1739   PLT 236 04/06/2017 1714   MCV 90.8 04/06/2017 1714   MCH 30.5 04/06/2017 1714   MCHC 33.5 04/06/2017 1714   RDW 13.1 04/06/2017 1714   LYMPHSABS 1.5 04/06/2017 1714   MONOABS 0.8 04/06/2017 1714   EOSABS 0.0 04/06/2017 1714   BASOSABS 0.0 04/06/2017 1714   CMP    Component Value Date/Time   NA 139 04/06/2017 1739   K 3.7 04/06/2017 1739   CL 102 04/06/2017  1739   CO2 25 04/06/2017 1714   GLUCOSE 141 (H) 04/06/2017 1739   BUN 11 04/06/2017 1739   CREATININE 0.80 04/06/2017 1739   CALCIUM 9.4 04/06/2017 1714   PROT 8.0 04/06/2017 1714   ALBUMIN 4.1 04/06/2017 1714   AST 48 (H) 04/06/2017 1714   ALT 53 04/06/2017 1714   ALKPHOS 63 04/06/2017 1714   BILITOT 0.7 04/06/2017 1714   GFRNONAA >60 04/06/2017 1714   GFRAA >60 04/06/2017 1714   COAGS Lab Results  Component Value Date   INR 1.18 04/06/2017   Lipid Panel    Component Value Date/Time   CHOL 159 04/07/2017 0226   TRIG 98 04/07/2017 0226   HDL 32 (L) 04/07/2017 0226   CHOLHDL 5.0 04/07/2017 0226   VLDL 20 04/07/2017 0226   LDLCALC 107 (H) 04/07/2017 0226   HgbA1C  Lab Results  Component Value Date   HGBA1C 6.4 (H) 04/07/2017   Urinalysis    Component Value Date/Time   COLORURINE YELLOW 04/07/2017 0306   APPEARANCEUR CLEAR 04/07/2017 0306   LABSPEC >1.046 (H) 04/07/2017 0306   PHURINE 5.0 04/07/2017 0306   GLUCOSEU NEGATIVE 04/07/2017 0306   HGBUR NEGATIVE 04/07/2017 0306   [place] NEGATIVE 04/07/2017 0306  KETONESUR 5 (A) 04/07/2017 0306   PROTEINUR NEGATIVE 04/07/2017 0306   NITRITE NEGATIVE 04/07/2017 0306   LEUKOCYTESUR NEGATIVE 04/07/2017 0306   Alcohol Level    Component Value Date/Time   ETH <10 04/06/2017 1714    SIGNIFICANT DIAGNOSTIC STUDIES Ct Head Code Stroke Wo Contrast 04/06/2017 IMPRESSION:  1. Acute suspected LEFT basal ganglia nonhemorrhagic infarct (MCA territory versus perforated ir).  2. Moderate to severe chronic small vessel ischemic disease and old lacunar infarcts.   CTA HEAD:  04/06/2017 IMPRESSION:  1. 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  2. No emergent large vessel occlusion.   CTA NECK:  04/06/2017 IMPRESSION:  1. No hemodynamically significant stenosis or acute vascular process.  2. Incidental aberrant RIGHT subclavian artery.  3. **An incidental finding of potential clinical significance  has been found. 3.7 cm ascending aorta. Recommend annual imaging followup by CTA or MRA. This recommendation follows 2010 ACCF/AHA/AATS/ACR/ASA/SCA/SCAI/SIR/STS/SVM Guidelines for the Diagnosis and Management of Patients with Thoracic Aortic Disease. Circulation.2010; 121: V956-L875**    CT PERFUSION:  04/06/2017 IMPRESSION:  1. Fixed LEFT parietoccipital junction perfusion defect (posterior watershed territory).  2. LEFT basal ganglia perfusion abnormality could reflect infarct or, penumbra and is not demonstrated on final map.  3. Prolonged LEFT MCA T-max on early phase, normalized by 8 seconds compatible with proximal stenosis.   Ct Head Wo Contrast 04/06/2017 IMPRESSION:  No acute abnormality. Expected retained intravascular contrast from earlier CTA study.   Ct Angio Head W Or Wo Contrast 04/07/2017 IMPRESSION:  1. Unchanged CTA examination without emergent large vessel occlusion. No intracranial hemorrhage.  2. Moderate proximal left MCA M1 segment stenosis with mild elevation of Tmax in the left MCA/PCA watershed area. This is also unchanged and likely due to the proximal M1 stenosis.  3. No acute infarct by CBF criteria. The previously seen 4 mL fixed perfusion defect in the left posterior watershed territory is no longer present. This may be due to residual intravascular enhancement from the earlier study.  4. Unchanged left basal ganglia perfusion abnormality, which remains indeterminate and is not demonstrated on the final Tmax or CBF maps.   MRI head 04/07/2017 IMPRESSION: 1. Acute multifocal ischemic left MCA and left MCA/PCA border zone infarcts involving the left cerebral hemisphere as above. Some of these infarcts are positioned in a somewhat watershed fashion. Associated faint petechial hemorrhage without hemorrhagic transformation or mass effect. 2. Underlying moderate chronic small vessel ischemic disease. 3. Pan sinusitis.  Dg Chest Port 1 View 04/07/2017 IMPRESSION:   Mild hazy left basilar airspace disease which may reflect atelectasis versus pneumonia.   Transthoracic Echocardiogram Left ventricle: The cavity size was normal. Wall thickness was increased in a pattern of mild LVH. There was moderate focal basal hypertrophy of the septum. Systolic function was vigorous.The estimated ejection fraction was in the range of 65% to 70%.Wall motion was normal; there were no regional wall motionabnormalities     HISTORY OF PRESENT ILLNESS Ebony R Springsis an 67 y.o.femalewho was initially evaluated at the [facility] after presenting there early the evening of 04/06/2017 with acute onset 2 hours previously of right sided weakness, right facial droop and AMS.She was last seen normal by husband at 58, then found by grandson in her bedroom unresponsive at 1530.EMS was called and on their arrival, the patient was found to be gazing leftward with right-sided weakness and difficulty producing speech.  CT headat OSHrevealeda subacute left sided basal ganglia nonhemorrhagic infarct.Teleneurology consult was obtained with overall impression that her  presentation wasconcerning for LMCA syndrome.There were no contraindications to tPA;husband consented to tPA administration. LVOorproximal embolic sourcewerepossible and aCTA/CTPwas obtained, which showed chronic appearing severe proximalleftM1 stenosis, without LVO.There was a fixed/matched perfusion defectat theleft parietal-occipital junctionwith increased transit time in BG, but no large cortical perfusion mismatch.The patient was determined by Teleneurology consultant tonot be an intervention/thrombectomy candidate with these findings.  After tPA was administered, per EDP at OSH, the patient appeared to have a positive response. Upon arrival at [facility], repeat CTA was completed that confirmed no LVO. She was admitted to the ICU for further evaluation and treatment.     HOSPITAL COURSE Ebony Williams is a 67 y.o. female with history of hypertension presenting with right-sided weakness, right facial droop, altered mental status, decreased level of responsiveness, beech difficulties, and leftward gaze s/p IV TPA at [facility] on Friday 04/06/2017 at 1751.  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  MRI head -  Acute multifocal ischemic left MCA and left MCA/PCA border zone infarcts positioned in a somewhat watershed fashion. Faint petechial hemorrhage without hemorrhagic transformation or mass effect.  CTA H&N -  Severe stenosis LEFT M1 origin, possible chronic thromboembolism.   Carotid Doppler - CTA neck  2D Echo - normal EF no cardiac source of embolism  LDL - 107  HgbA1c - 6.4  No antithrombotic prior to admission, now on dual antiplatelet therapy with aspirin and plavix. Continue x 3 weeks then aspirin 81 mg alone  Patient counseled to be compliant with her antithrombotic medications  Ongoing aggressive stroke risk factor management  Therapy recommendations:  CIR  Disposition:  CIR, home with husband following  Hypertension  Stable  Long-term BP goal normotensive  Hyperlipidemia  Home meds: No lipid lowering medications prior to admission.  LDL 107, goal < 70  Added Lipitor 10 mg daily at discharge  Prediabetes  Other Stroke Risk Factors  Advanced age  ETOH use, advised to drink no more than 1 drink per day. ETOH neg on admission  Obesity, Body mass index is 31.03 kg/m., recommend weight loss, diet and exercise as appropriate   Hx stroke/TIA - by imaging  Other Active Problems  3.7 cm ascending aorta. Recommend annual imaging followup by CTA or MRA.   DISCHARGE EXAM Blood pressure (!) 157/69, pulse 79, temperature  98.5 F (36.9 C), temperature source Axillary, resp. rate 16, height '5\' 1"'$  (1.549 m), weight 74.5 kg (164 lb 3.9 oz), SpO2 95 %. Sleepy following ST session this am, can awake with touch. Follows simple midline commands. Simple 1 word repetition. Eye opening apraxia. Eyes deviated L but can cross midline. Blinks to threat on the L but not on the R. Right lower facial weakness. Tongue midline. Dense R hemiplegia with no withdrawal to pain arm or leg. Purposeful movements on the L. No sensation on R to touch, good sensation on L. Gait deferred. HRR. BS clear.  Discharge Diet  Check puncture sites for bleeding or hematomas. Fall precautions DIET - DYS 1 Room service appropriate? Yes; Fluid consistency: Nectar Thick liquids  DISCHARGE PLAN  Disposition:  Transfer to [facility] for ongoing PT, OT and ST  aspirin 81 mg daily and clopidogrel 75 mg daily for secondary stroke prevention x 3 weeks then aspirin alone  Recommend ongoing risk  factor control by Primary Care Physician at time of discharge from inpatient rehabilitation.  Follow-up Sharilyn Sites, MD in 2 weeks following discharge from rehab.  Follow-up with Dr. Antony Contras, Stroke Clinic in 2 months following discharge, office to schedule an appointment.   45 minutes were spent preparing discharge.  [place] for Pager information 04/11/2017 3:04 PM  I have personally examined this patient, reviewed notes, independently viewed imaging studies, participated in medical decision making and plan of care.ROS completed by me personally and pertinent positives fully documented  I have made any additions or clarifications directly to the above note. Agree with note above.    Antony Contras, MD Medical Director [facility] Pager: [phone] 04/11/2017 5:30 PM

## 2017-04-11 NOTE — Progress Notes (Signed)
RT NOTE:  Pt not put on CPAP tonight. RT did not feel comfortable putting CPAP mask on patient. Pt is unable to answer questions clearly about settings. RT explained patient did not seem to understand what RT was asking and fear patient will not be able to communicate if something is not ok with CPAP. Husband will bring home CPAP tomorrow. RN aware and will place cont. Pulse ox on patient and plans to make RT aware of any desat or apnea periods. CPAP in room if needed.

## 2017-04-11 NOTE — Progress Notes (Signed)
Report given to Rehab Nurse, patient being transferred to [facility]. Pt/family notified.

## 2017-04-11 NOTE — Evaluation (Signed)
Speech Language Pathology Evaluation Patient Details Name: Ebony Williams MRN: [id number] DOB: Dec 12, 1950 Today's Date: 04/11/2017 Time: 5621-3086 SLP Time Calculation (min) (ACUTE ONLY): 35 min  Problem List:  Patient Active Problem List   Diagnosis Date Noted  . Dysphagia, post-stroke   . Diastolic dysfunction   . Benign essential HTN   . Tachypnea   . Prediabetes   . Leukocytosis   . CVA (cerebral vascular accident) ([place]) 04/06/2017  . Stroke (cerebrum) ([place]) 04/06/2017  . 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 Pain in joint, shoulder region 10/18/2011   Past Medical History:  Past Medical History:  Diagnosis Date  . History of kidney stones    x1  . Hypertension    Past Surgical History:  Past Surgical History:  Procedure Laterality Date  . CHOLECYSTECTOMY     laparoscopic  . COLONOSCOPY WITH PROPOFOL N/A 01/12/2015   Procedure: COLONOSCOPY WITH PROPOFOL;  Surgeon: Garlan Fair, MD;  Location: [facility];  Service: Endoscopy;  Laterality: N/A;  . TUBAL LIGATION     HPI:  Allyssia R Springsis an 67 y.o.with right sided weakness, right facial droop and AMS.CT Acute suspected LEFT basal ganglia nonhemorrhagic infarct, moderate to severe chronic small vessel ischemic disease and old lacunar infarcts. CXR mild hazy left basilar airspace disease which may reflect atelectasis versus pneumonia.   Assessment / Plan / Recommendation Clinical Impression  Pt demonstrates moderate deficits in multiple areas impacting cognitive linguistic function including vision, language and motor planning. Pt is able to comprehend single words and simple familiar phrases, assessed via one step commands in functional tasks. When asked to identify or name line drawings, pts right visual field deficits and left gaze preference appear to impact accuracy. Even when naming or identifying familiar objects pt had approximately 50% accuracy. Mild verbal perseverations observed. Pt is able to articulate  accurately at the word level (naming, repeating and counting), but coordination of respiration for phonation and speech is poor resulting in aphonic speech. Given initial observations of effortful initiation of speech in prior visits, now quite improved, suspect there is still a mild motor planning impairment. Pt improved with verbal cues and modeling for deeper inhalation for phonation on single words. Demonstrated basic strategies to improve pt comprehension and expression of wants and needs to family and increase independence in basic ADLs with minimal verbal cues. Recommend CIR at next level of care.     SLP Assessment  SLP Recommendation/Assessment: Patient needs continued Speech Lanaguage Pathology Services SLP Visit Diagnosis: Aphasia (R47.01)    Follow Up Recommendations  Inpatient Rehab    Frequency and Duration min 2x/week  2 weeks      SLP Evaluation Cognition  Overall Cognitive Status: Impaired/Different from baseline Arousal/Alertness: Awake/alert Orientation Level: Oriented to person;Oriented to place Attention: Focused;Sustained Focused Attention: Appears intact Sustained Attention: Appears intact Memory: (unable to assess) Awareness: Impaired Awareness Impairment: Emergent impairment Problem Solving: Impaired Problem Solving Impairment: Verbal basic;Verbal complex Safety/Judgment: Appears intact       Comprehension  Auditory Comprehension Overall Auditory Comprehension: Impaired Yes/No Questions: Impaired Basic Biographical Questions: 76-100% accurate Complex Questions: 25-49% accurate Commands: Impaired One Step Basic Commands: 75-100% accurate Two Step Basic Commands: 25-49% accurate Interfering Components: Motor planning;Visual impairments Reading Comprehension Reading Status: Within funtional limits(for single words)    Expression Expression Primary Mode of Expression: Verbal Verbal Expression Overall Verbal Expression: Impaired Initiation: No  impairment Automatic Speech: Name;Social Response;Counting Level of Generative/Spontaneous Verbalization: Word Repetition: Impaired Level of Impairment: Phrase level Naming: Impairment Responsive: 0-25%  accurate Confrontation: Impaired Convergent: Not tested Divergent: Not tested Verbal Errors: Perseveration Pragmatics: No impairment Effective Techniques: Other (Comment)(none) Written Expression Dominant Hand: Right Written Expression: Not tested   Oral / Motor  Oral Motor/Sensory Function Overall Oral Motor/Sensory Function: Moderate impairment Facial ROM: Reduced right;Suspected CN VII (facial) dysfunction Facial Symmetry: Abnormal symmetry right;Suspected CN VII (facial) dysfunction Facial Strength: Reduced right;Suspected CN VII (facial) dysfunction Facial Sensation: Reduced right;Suspected CN V (Trigeminal) dysfunction Lingual ROM: Other (Comment) Lingual Strength: Within Functional Limits Motor Speech Overall Motor Speech: Impaired Respiration: Impaired Level of Impairment: Word Phonation: Breathy;Low vocal intensity Resonance: Within functional limits Articulation: Within functional limitis Intelligibility: Intelligibility reduced Word: 75-100% accurate Phrase: 50-74% accurate Sentence: Not tested Motor Planning: Impaired Level of Impairment: Word Motor Speech Errors: Consistent   GO                   Herbie Baltimore, MA CCC-SLP [phone]  Lynann Beaver 04/11/2017, 1:27 PM

## 2017-04-11 NOTE — H&P (Signed)
Physical Medicine and Rehabilitation Admission H&P    Chief Complaint  Patient presents with  . Code Stroke  : HPI: Ebony Williams is a 67 y.o. right handed female with history of hypertension. Per chart review and husband, patient lives with spouse. Reported to be independent prior to admission. 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 One level home with 3 steps to entry. Family in the area question 24 hour assistance. Presented 04/06/2017 with right sided weakness, left gaze and aphasia. CT head reviewed, showing left basal ganglia infarct.  Per report, subacute left-sided basal ganglia nonhemorrhagic infarction. Patient did receive TPA. CT angiogram head and neck with no hemodynamically significant stenosis or acute vascular process. 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 Echocardiogram with ejection fraction of 35% grade 1 diastolic dysfunction without embolism. Neurology consulted presently on aspirin as well as Plavix for CVA prophylaxis. Dysphagia #1 nectar thick liquid diet. Physical occupational therapy evaluations completed 04/09/2017 with recommendations of physical medicine rehabilitation consult. Patient was admitted for a comprehensive rehabilitation program   Review of Systems  Constitutional: Negative for fever.  HENT: Negative for hearing loss.   Eyes: Negative for blurred vision and double vision.  Respiratory: Negative for shortness of breath.   Cardiovascular: Negative for chest pain, palpitations and leg swelling.  Gastrointestinal: Positive for constipation. Negative for nausea and vomiting.  Genitourinary: Negative for dysuria, flank pain and hematuria.  Musculoskeletal: Positive for myalgias.  Skin: Negative for rash.  Neurological: Positive for speech change and focal weakness. Negative for seizures.  All other systems reviewed and are  negative.  Past Medical History:  Diagnosis Date  . History of kidney stones    x1  . Hypertension    Past Surgical History:  Procedure Laterality Date  . CHOLECYSTECTOMY     laparoscopic  . COLONOSCOPY WITH PROPOFOL N/A 01/12/2015   Procedure: COLONOSCOPY WITH PROPOFOL;  Surgeon: Garlan Fair, MD;  Location: [facility];  Service: Endoscopy;  Laterality: N/A;  . TUBAL LIGATION     Family History  Problem Relation Age of Onset  . Diabetes Mother   . Hypertension Mother    Social History:  reports that  has never smoked. she has never used smokeless tobacco. She reports that she drinks alcohol. She reports that she does not use drugs. Allergies:  Allergies  Allergen Reactions  . Sulfa Antibiotics Diarrhea and Itching   Medications Prior to Admission  Medication Sig Dispense Refill  . ALPRAZolam (XANAX) 0.25 MG tablet Take 0.25 mg by mouth at bedtime as needed for anxiety or sleep.     [name] ibuprofen (ADVIL,MOTRIN) 200 MG tablet Take 200-600 mg by mouth every 6 (six) hours as needed for mild pain.     . valsartan-hydrochlorothiazide (DIOVAN-HCT) 320-12.5 MG tablet Take 1 tablet by mouth daily.    [name] venlafaxine XR (EFFEXOR-XR) 150 MG 24 hr capsule Take 150 mg by mouth daily with breakfast.    . zolpidem (AMBIEN) 10 MG tablet Take 10 mg by mouth at bedtime as needed for sleep.     [name] ALPRAZolam (XANAX) 0.5 MG tablet Take 0.25-0.5 mg by mouth every 4 (four) hours as needed. Insomnia or anxiety    . venlafaxine XR (EFFEXOR-XR) 75 MG 24 hr capsule Take 75 mg by mouth daily.      Drug Regimen Review Drug regimen was reviewed and remains appropriate with  no significant issues identified  Home: Home Living Family/patient expects to be discharged to:: Private residence Living Arrangements: Spouse/significant other Available Help at Discharge: Family, Available 24 hours/day Type of Home: House Home Access: Stairs to enter Technical brewer of Steps: 3 Home Layout: One  level Bathroom Shower/Tub: Multimedia programmer: Standard Home Equipment: Civil engineer, contracting Additional Comments: pt is caregiver for spouse, 27yo nephew and 13mo granddaughter   Functional History: Prior Function Level of Independence: Independent  Functional Status:  Mobility: Bed Mobility Overal bed mobility: Needs Assistance Bed Mobility: Supine to Sit Sit to supine: Mod assist General bed mobility comments: assist to move Rt LE off bed, and assist to lift trunk, pt utilizing LUE to help push off of bed Transfers Overall transfer level: Needs assistance Equipment used: None Transfers: Sit to/from Stand, Stand Pivot Transfers Sit to Stand: Mod assist, +2 physical assistance, +2 safety/equipment Stand pivot transfers: Mod assist, +2 physical assistance, +2 safety/equipment General transfer comment: Pt initially with buckling of Rt knee.  SHe was able to minimally activate Rt LE during weight shift and transfer with hyperextension of Rt knee noted. Pt with heavy reliance on LLE with ability to scoot foot to left for pivot to chair with bil UE support      ADL: ADL Overall ADL's : Needs assistance/impaired Eating/Feeding: Moderate assistance, Sitting Grooming: Brushing hair, Minimal assistance, Sitting Grooming Details (indicate cue type and reason): assist to initiate task.  Pt initially moved comb to cheek then her mouth.  Once she was able to initiate combing her hair, she was able to complete task - she did attend to left side of head  Upper Body Bathing: Maximal assistance, Sitting Lower Body Bathing: Total assistance, Sit to/from stand Upper Body Dressing : Sitting, Total assistance Lower Body Dressing: Total assistance, Sit to/from stand Toilet Transfer: Moderate assistance, +2 for safety/equipment, +2 for physical assistance, Stand-pivot, BSC Toileting- Clothing Manipulation and Hygiene: Total assistance, Sit to/from stand Functional mobility during ADLs: Moderate  assistance, +2 for physical assistance, +2 for safety/equipment General ADL Comments: Pt with Rt hemiplegia and Rt neglect/inattention   Cognition: Cognition Overall Cognitive Status: Difficult to assess Orientation Level: Other (comment)(UTA/ patient is not verbal) Cognition Arousal/Alertness: Awake/alert Behavior During Therapy: Flat affect Overall Cognitive Status: Difficult to assess Area of Impairment: Following commands Following Commands: Follows one step commands inconsistently, Follows one step commands with increased time General Comments: pt unable to verbalize Difficult to assess due to: Impaired communication  Physical Exam: Blood pressure (!) 164/77, pulse 72, temperature 98.4 F (36.9 C), temperature source Oral, resp. rate (!) 24, height 5\' 1"  (1.549 m), weight 74.5 kg (164 lb 3.9 oz), SpO2 94 %. Physical Exam  Vitals reviewed. Constitutional: She appears well-developed and well-nourished.  HENT:  Head: Normocephalic and atraumatic.  Eyes: Right eye exhibits no discharge. Left eye exhibits no discharge.  Briefly opens eyes  Neck: Normal range of motion. Neck supple.  Cardiovascular: Normal rate and regular rhythm.  Respiratory: Effort normal and breath sounds normal.  GI: Soft. Bowel sounds are normal.  Musculoskeletal:  No edema or tenderness in extremities  Neurological:  Alert but keeps eyes closed Nonverbal Motor: LUE/LLE: 4+/5 proximal to distal RUE/RLE: 0/5 proximal to distal  Skin: Skin is warm and dry.  Psychiatric:  Unable to assess due to mentation/language    Results for orders placed or performed during the hospital encounter of 04/06/17 (from the past 48 hour(s))  Glucose, capillary     Status: Abnormal   Collection  Time: 04/08/17 11:22 AM  Result Value Ref Range   Glucose-Capillary 127 (H) 65 - 99 mg/dL  Glucose, capillary     Status: Abnormal   Collection Time: 04/08/17  3:53 PM  Result Value Ref Range   Glucose-Capillary 126 (H) 65 -  99 mg/dL  Glucose, capillary     Status: Abnormal   Collection Time: 04/08/17  7:45 PM  Result Value Ref Range   Glucose-Capillary 133 (H) 65 - 99 mg/dL  Glucose, capillary     Status: Abnormal   Collection Time: 04/09/17  8:20 AM  Result Value Ref Range   Glucose-Capillary 129 (H) 65 - 99 mg/dL   No results found.     Medical Problem List and Plan: 1.  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  DVT Prophylaxis/Anticoagulation: SCDs. Monitor for any signs of DVT 3. Pain Management: Tylenol as needed 4. Mood: Effexor 75 mg daily, Xanax 0.5 mg 3 times a day as needed 5. Neuropsych: This patient is capable of making decisions on his own behalf. 6. Skin/Wound Care: Routine skin checks 7. Fluids/Electrolytes/Nutrition: Routine I&O's with follow-up chemistries 8. Hypertension. HCTZ 12.5 mg daily, Avapro 300 mg daily. Monitor with increased mobility 9. Dysphagia. Dysphagia #1 nectar liquids. Follow-up speech therapy    Post Admission Physician Evaluation: 1. Preadmission assessment reviewed and changes made below. 2. 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 3. Patient is admitted to receive collaborative, interdisciplinary care between the physiatrist, rehab nursing staff, and therapy team. 4. Patient's level of medical complexity and substantial therapy needs in context of that medical necessity cannot be provided at a lesser intensity of care such as a SNF. 5. Patient has experienced substantial functional loss from his/her baseline which was documented above under the "Functional History" and "Functional Status" headings.  Judging by the patient's diagnosis, physical exam, and functional history, the patient has potential for functional progress which will result in measurable gains while on inpatient rehab.  These gains will be of substantial and practical use upon discharge  in facilitating  mobility and self-care at the household level. 6. Physiatrist will provide 24 hour management of medical needs as well as oversight of the therapy plan/treatment and provide guidance as appropriate regarding the interaction of the two. 7. 24 hour rehab nursing will assist with safety, disease management, medication administration and patient education  and help integrate therapy concepts, techniques,education, etc. 8. PT will assess and treat for/with: Lower extremity strength, range of motion, stamina, balance, functional mobility, safety, adaptive techniques and equipment, coping skills, pain control, stroke education.   Goals are: Min A. 9. OT will assess and treat for/with: ADL's, functional mobility, safety, upper extremity strength, adaptive techniques and equipment, ego support, and community reintegration.   Goals are: Min A. Therapy may proceed with showering this patient. 10. SLP will assess and treat for/with: speech, language, swallowing, cognition.  Goals are: Min/Mod A. 11. Case Management and Social Worker will assess and treat for psychological issues and discharge planning. 12. Team conference will be held weekly to assess progress toward goals and to determine barriers to discharge. 13. Patient will receive at least 3 hours of therapy per day at least 5 days per week. 14. ELOS: 17-20 days.       15. Prognosis:  good  Delice Lesch, MD, ABPMR Lavon Paganini Angiulli, PA-C 04/10/2017

## 2017-04-11 NOTE — Progress Notes (Signed)
Cristina Gong, RN  Rehab Admission Coordinator  Physical Medicine and Rehabilitation  PMR Pre-admission  Signed  Date of Service:  04/11/2017 1:35 PM       Related encounter: ED to Hosp-Admission (Current) from 04/06/2017 in [facility]      Signed           [] Hide copied text  [] Hover for details   PMR Admission Coordinator Pre-Admission Assessment  Patient: Ebony Williams is an 67 y.o., female MRN: [id number] DOB: 1951/02/05 Height: 5\' 1"  (154.9 cm) Weight: 74.5 kg (164 lb 3.9 oz)                                                                                                                                                  Insurance Information HMO:     PPO: yes     PCP:      IPA:      80/20:      OTHER: medicare advantage plan PRIMARY: [employer] Medicare      Policy#: [id number]      Subscriber: pt CM Name: Orvan July      Phone#: [phone]     Fax#: [phone] Pre-Cert#: [id number]  F/u withJill phone [phone] fax [phone] in 7 days    Employer: retired Benefits:  Phone #: [phone]     Name: 04/10/17 Eff. Date: 02/06/2017     Deduct: none      Out of Pocket Max: $4000      Life Max: none CIR: $160 co pay per day days 1 until 10      SNF: no co pay days 1 to 20; $50 co pay per day days 21- 100 Outpatient: $20 co pay per visit     Co-Pay: visits per medical neccesity Home Health: 100%      Co-Pay: visits per medical neccesity DME: 80%     Co-Pay: 20% Providers: in network  SECONDARY: none        Medicaid Application Date:       Case Manager:  Disability Application Date:       Case Worker:   Emergency Tax adviser Information    Name Relation Home Work Mobile   [place] Spouse [phone]     Tuck,Carrie Daughter [phone]       Current Medical History  Patient Admitting Diagnosis: 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  Presented 04/06/2017 with right sided weakness, left gaze and aphasia. CT head reviewed, showing left basal ganglia infarct. Per report, subacute left-sided basal ganglia nonhemorrhagic infarction. Patient did receive TPA. CT angiogram head and neck with no hemodynamically significant stenosis or acute vascular process. There was noted left M1 stenosisfelt to  be chronic and no revascularization was recommended.[name] MRI showed acute multifocal ischemic left MCA and left MCA/PCA infarcts. Echocardiogram with ejection fraction of 03% grade 1 diastolic dysfunctionwithout embolism. Neurology consulted presently on aspirinas well as Plavixfor CVA prophylaxis. Dysphagia #1 nectar thick liquid diet.   Total: 20 NIHSS  Past Medical History      Past Medical History:  Diagnosis Date  . History of kidney stones    x1  . Hypertension     Family History  family history includes Diabetes in her mother; Hypertension in her mother.  Prior Rehab/Hospitalizations:  Has the patient had major surgery during 100 days prior to admission? No  Current Medications   Current Facility-Administered Medications:  .  [COMPLETED] alteplase (ACTIVASE) 1 mg/mL infusion 71 mg, 0.9 mg/kg, Intravenous, Once, Stopped at 04/06/17 1856 **FOLLOWED BY** 0.9 %  sodium chloride infusion, 50 mL, Intravenous, Once, Nat Christen, MD .  acetaminophen (TYLENOL) tablet 650 mg, 650 mg, Oral, Q4H PRN **OR** acetaminophen (TYLENOL) solution 650 mg, 650 mg, Per Tube, Q4H PRN **OR** acetaminophen (TYLENOL) suppository 650 mg, 650 mg, Rectal, Q4H PRN, Kerney Elbe, MD .  ALPRAZolam Duanne Moron) tablet 0.5 mg, 0.5 mg, Per Tube, TID PRN, Rinehuls, David L, PA-C .  aspirin EC tablet 81 mg, 81 mg, Oral, Daily, Garvin Fila, MD, 81 mg at 04/11/17 1126 .  chlorhexidine (PERIDEX) 0.12 % solution 15 mL, 15 mL, Mouth Rinse, BID, Garvin Fila, MD, 15 mL at 04/11/17  1125 .  clopidogrel (PLAVIX) tablet 75 mg, 75 mg, Oral, Daily, Leonie Man, Pramod S, MD, 75 mg at 04/11/17 1123 .  hydrALAZINE (APRESOLINE) injection 20 mg, 20 mg, Intravenous, Q4H PRN, Rinehuls, David L, PA-C, 20 mg at 04/09/17 2000 .  hydrochlorothiazide (MICROZIDE) capsule 12.5 mg, 12.5 mg, Oral, Daily, Rinehuls, David L, PA-C, 12.5 mg at 04/11/17 1121 .  irbesartan (AVAPRO) tablet 300 mg, 300 mg, Oral, Daily, Garvin Fila, MD, 300 mg at 04/11/17 1123 .  labetalol (NORMODYNE,TRANDATE) injection 20 mg, 20 mg, Intravenous, Q2H PRN, Rinehuls, David L, PA-C, 20 mg at 04/11/17 0826 .  MEDLINE mouth rinse, 15 mL, Mouth Rinse, q12n4p, Garvin Fila, MD, 15 mL at 04/10/17 1503 .  pantoprazole (PROTONIX) EC tablet 40 mg, 40 mg, Oral, QHS, Sethi, Lucy Antig[place], MD .  RESOURCE THICKENUP CLEAR, , Oral, PRN, Garvin Fila, MD .  senna-docusate (Senokot-S) tablet 1 tablet, 1 tablet, Oral, QHS PRN, Kerney Elbe, MD .  venlafaxine XR (EFFEXOR-XR) 24 hr capsule 75 mg, 75 mg, Oral, Daily, Kerney Elbe, MD, 75 mg at 04/11/17 1119  Patients Current Diet: Check puncture sites for bleeding or hematomas. Fall precautions DIET - DYS 1 Room service appropriate? Yes; Fluid consistency: Nectar Thick  Precautions / Restrictions Precautions Precautions: Fall Precaution Comments: right hemiplegia and neglect Restrictions Weight Bearing Restrictions: No   Has the patient had 2 or more falls or a fall with injury in the past year?No  Prior Activity Level Community (5-7x/wk): Independent and driving pta; cared for 34 year old nephew pta  Development worker, international aid / [place] Devices/Equipment: None Home Equipment: Shower seat  Prior Device Use: Indicate devices/aids used by the patient prior to current illness, exacerbation or injury? None of the above  Prior Functional Level Prior Function Level of Independence: Independent  Self Care: Did the patient need help bathing, dressing, using  the toilet or eating?  Independent  Indoor Mobility: Did the patient need assistance with walking from room to room (with or without device)?  Independent  Stairs: Did the patient need assistance with internal or external stairs (with or without device)? Independent  Functional Cognition: Did the patient need help planning regular tasks such as shopping or remembering to take medications? Independent  Current Functional Level Cognition  Arousal/Alertness: Awake/alert Overall Cognitive Status: Impaired/Different from baseline Difficult to assess due to: Impaired communication Orientation Level: Oriented to person, Oriented to place Following Commands: Follows one step commands inconsistently, Follows one step commands with increased time General Comments: pt able to state her name and responded "good morning" when same stated to her. No other response to questions Attention: Focused, Sustained Focused Attention: Appears intact Sustained Attention: Appears intact Memory: (unable to assess) Awareness: Impaired Awareness Impairment: Emergent impairment Problem Solving: Impaired Problem Solving Impairment: Verbal basic, Verbal complex Safety/Judgment: Appears intact    Extremity Assessment (includes Sensation/Coordination)  Upper Extremity Assessment: Defer to OT evaluation RUE Deficits / Details: Pt with no active movement Rt UE.  Extensor tone presents.  Pt with Rt neglect  RUE Sensation: decreased light touch, decreased proprioception RUE Coordination: decreased fine motor, decreased gross motor  Lower Extremity Assessment: RLE deficits/detail RLE Deficits / Details: no AROM, no response to painful stimuli, pt with knee hyperextension in standing    ADLs  Overall ADL's : Needs assistance/impaired Eating/Feeding: Moderate assistance, Sitting Grooming: Brushing hair, Minimal assistance, Sitting Grooming Details (indicate cue type and reason): assist to initiate task.  Pt  initially moved comb to cheek then her mouth.  Once she was able to initiate combing her hair, she was able to complete task - she did attend to left side of head  Upper Body Bathing: Maximal assistance, Sitting Lower Body Bathing: Total assistance, Sit to/from stand Upper Body Dressing : Sitting, Total assistance Lower Body Dressing: Total assistance, Sit to/from stand Toilet Transfer: Moderate assistance, +2 for safety/equipment, +2 for physical assistance, Stand-pivot, BSC Toileting- Clothing Manipulation and Hygiene: Total assistance, Sit to/from stand Functional mobility during ADLs: Moderate assistance, +2 for physical assistance, +2 for safety/equipment General ADL Comments: Pt with Rt hemiplegia and Rt neglect/inattention     Mobility  Overal bed mobility: Needs Assistance Bed Mobility: Supine to Sit Supine to sit: Mod assist Sit to supine: Mod assist General bed mobility comments: assist to move Rt LE off bed, and assist to lift trunk, pt utilizing LUE to help push off of bed along with rail     Transfers  Overall transfer level: Needs assistance Equipment used: None Transfers: Sit to/from Stand, Stand Pivot Transfers Sit to Stand: Mod assist, +2 physical assistance, +2 safety/equipment Stand pivot transfers: Mod assist, +2 physical assistance, +2 safety/equipment General transfer comment: pt initially with weight on right toes with assist to place heel on ground pt hyperextends knee and flexes at hip to tolerate weight on RLE with pt standing and scooting RLE to pivot to chair with bil UE supported. Performed sit to stand x 2 trials with inability to position RLE out of hyperextension in standing    Ambulation / Gait / Stairs / Wheelchair Mobility  Ambulation/Gait General Gait Details: unable    Posture / Balance Dynamic Sitting Balance Sitting balance - Comments: pt able to sit with minguard assist EOB with midline posture x 5 min with cues to attend to left and  redirection to task Balance Overall balance assessment: Needs assistance Sitting-balance support: Feet supported Sitting balance-Leahy Scale: Fair Sitting balance - Comments: pt able to sit with minguard assist EOB with midline posture x 5 min with cues to attend  to left and redirection to task Postural control: Posterior lean Standing balance-Leahy Scale: Poor Standing balance comment: bil UE support in standing    Special needs/care consideration BiPAP/CPAP  N/a CPM  N/a Continuous Drip IV  N/a Dialysis  N/a Life Vest  N/a Oxygen  N/a Special Bed  N/a Trach Size  N/a Wound Vac n/a Skin tear to buttocks and to right hand Bowel mgmt: incontinent LBM 3/4 Bladder mgmt:incontinent; external catheter Diabetic mgmt  N/a Has never been hospitalized except for delivery of two children   Previous Home Environment Living Arrangements: (and 68 year old nephew)  Lives With: Spouse Available Help at Discharge: (spouse and daughter and possibly hired caregivers) Type of Home: House Home Layout: One level Home Access: Stairs to enter Technical brewer of Steps: 3 Bathroom Shower/Tub: Multimedia programmer: Standard Bathroom Accessibility: Yes How Accessible: Accessible via walker Home Care Services: No Additional Comments: spouse no longer on pain meds for past few months so he is able now to assist in her care  Discharge Living Setting Plans for Discharge Living Setting: Patient's home, Lives with (comment)(spouse and 16 year old nephew) Type of Home at Discharge: House Discharge Home Layout: One level Discharge Home Access: Stairs to enter Entrance Stairs-Rails: None Entrance Stairs-Number of Steps: 3 Discharge Bathroom Shower/Tub: Horticulturist, commercial: Standard Discharge Bathroom Accessibility: Yes How Accessible: Accessible via walker Does the patient have any problems obtaining your medications?: No  Social/Family/Support  Systems Patient Roles: Spouse, Parent, Caregiver Contact Information: spouse and daughter coordinate together; spouse is daughter's stepdad Anticipated Caregiver: spouse and daughter Anticipated Caregiver's Contact Information: see above Ability/Limitations of Caregiver: spouse no limitations; daughter works during the day Caregiver Availability: 24/7 Discharge Plan Discussed with Primary Caregiver: Yes Is Caregiver In Agreement with Plan?: Yes Does Caregiver/Family have Issues with Lodging/Transportation while Pt is in Rehab?: No   There is reports that 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 Daughter clarified that her Step Dad was having some heart issues and was on pain meds for his back in the past. ON no pain meds currently and she feels competent to be her POA and caregiver. Daughter works days and another family friend, Hoyle Sauer may help. Daughter is also discussing hired caregivers possible for about 5 hours per day. 80 year old nephew that patient was his caregiver, may go to stay with daughter to relieve stress on her Step dad.  Goals/Additional Needs Patient/Family Goal for Rehab: min assist with PT, OT, and SLP Expected length of stay: ELOS 19 to 24 days Pt/Family Agrees to Admission and willing to participate: Yes Program Orientation Provided & Reviewed with Pt/Caregiver Including Roles  & Responsibilities: Yes  Decrease burden of Care through IP rehab admission: n/a  Possible need for SNF placement upon discharge: not anticipated  Patient Condition: This patient's medical and functional status has changed since the consult dated: 04/09/2017 in which the Rehabilitation Physician determined and documented that the patient's condition is appropriate for intensive rehabilitative care in an inpatient rehabilitation facility. See "History of Present Illness" (above) for medical update. Functional changes are: max assist. Patient's medical and functional status update has been discussed  with the Rehabilitation physician and patient remains appropriate for inpatient rehabilitation. Will admit to inpatient rehab today.  Preadmission Screen Completed By:  Cleatrice Burke, 04/11/2017 1:35 PM ______________________________________________________________________   Discussed status with Dr. Posey Pronto on 04/11/2017 at  1345 and received telephone approval for admission today.  Admission Coordinator:  Cleatrice Burke, time 4098 Date  04/11/2017             Cosigned by: [name], MD at 04/11/2017 1:48 PM  Revision History

## 2017-04-11 NOTE — Progress Notes (Signed)
I met with pt's daughter and husband at bedside to discuss goals and expectations of an inpt rehab admit. They prefer inpt rehab and are in agreement to admit. I await insurance approval to possibly admit today pending their approval. I will follow up today. [phone]

## 2017-04-12 ENCOUNTER — Inpatient Hospital Stay (HOSPITAL_COMMUNITY): Payer: Medicare Other | Admitting: Physical Therapy

## 2017-04-12 ENCOUNTER — Inpatient Hospital Stay (HOSPITAL_COMMUNITY): Payer: Medicare Other | Admitting: Occupational Therapy

## 2017-04-12 ENCOUNTER — Other Ambulatory Visit: Payer: Self-pay

## 2017-04-12 ENCOUNTER — Inpatient Hospital Stay (HOSPITAL_COMMUNITY): Payer: Medicare Other | Admitting: Speech Pathology

## 2017-04-12 DIAGNOSIS — I63512 Cerebral infarction due to unspecified occlusion or stenosis of left middle cerebral artery: Secondary | ICD-10-CM

## 2017-04-12 DIAGNOSIS — D72829 Elevated white blood cell count, unspecified: Secondary | ICD-10-CM

## 2017-04-12 DIAGNOSIS — I10 Essential (primary) hypertension: Secondary | ICD-10-CM

## 2017-04-12 DIAGNOSIS — G8191 Hemiplegia, unspecified affecting right dominant side: Secondary | ICD-10-CM

## 2017-04-12 DIAGNOSIS — R740 Nonspecific elevation of levels of transaminase and lactic acid dehydrogenase [LDH]: Secondary | ICD-10-CM

## 2017-04-12 DIAGNOSIS — I639 Cerebral infarction, unspecified: Secondary | ICD-10-CM

## 2017-04-12 DIAGNOSIS — I69391 Dysphagia following cerebral infarction: Secondary | ICD-10-CM

## 2017-04-12 DIAGNOSIS — R7401 Elevation of levels of liver transaminase levels: Secondary | ICD-10-CM | POA: Insufficient documentation

## 2017-04-12 DIAGNOSIS — R7303 Prediabetes: Secondary | ICD-10-CM

## 2017-04-12 DIAGNOSIS — I169 Hypertensive crisis, unspecified: Secondary | ICD-10-CM

## 2017-04-12 LAB — CBC WITH DIFFERENTIAL/PLATELET
BASOS PCT: 0 %
Basophils Absolute: 0 10*3/uL (ref 0.0–0.1)
Eosinophils Absolute: 0.1 10*3/uL (ref 0.0–0.7)
Eosinophils Relative: 1 %
HEMATOCRIT: 47.2 % — AB (ref 36.0–46.0)
HEMOGLOBIN: 16.2 g/dL — AB (ref 12.0–15.0)
Lymphocytes Relative: 24 %
Lymphs Abs: 3 10*3/uL (ref 0.7–4.0)
MCH: 31.8 pg (ref 26.0–34.0)
MCHC: 34.3 g/dL (ref 30.0–36.0)
MCV: 92.7 fL (ref 78.0–100.0)
MONOS PCT: 7 %
Monocytes Absolute: 0.9 10*3/uL (ref 0.1–1.0)
NEUTROS ABS: 8.4 10*3/uL — AB (ref 1.7–7.7)
Neutrophils Relative %: 68 %
Platelets: 261 10*3/uL (ref 150–400)
RBC: 5.09 MIL/uL (ref 3.87–5.11)
RDW: 14 % (ref 11.5–15.5)
WBC: 12.4 10*3/uL — ABNORMAL HIGH (ref 4.0–10.5)

## 2017-04-12 LAB — COMPREHENSIVE METABOLIC PANEL
ALK PHOS: 60 U/L (ref 38–126)
ALT: 79 U/L — ABNORMAL HIGH (ref 14–54)
ANION GAP: 13 (ref 5–15)
AST: 62 U/L — ABNORMAL HIGH (ref 15–41)
Albumin: 3.3 g/dL — ABNORMAL LOW (ref 3.5–5.0)
BUN: 21 mg/dL — ABNORMAL HIGH (ref 6–20)
CALCIUM: 9.2 mg/dL (ref 8.9–10.3)
CO2: 23 mmol/L (ref 22–32)
Chloride: 106 mmol/L (ref 101–111)
Creatinine, Ser: 0.65 mg/dL (ref 0.44–1.00)
GFR calc Af Amer: >60 mL/min (ref >60)
GFR calc non Af Amer: >60 mL/min (ref >60)
Glucose, Bld: 124 mg/dL — ABNORMAL HIGH (ref 65–99)
Potassium: 3.9 mmol/L (ref 3.5–5.1)
Sodium: 142 mmol/L (ref 135–145)
TOTAL PROTEIN: 7.3 g/dL (ref 6.5–8.1)
Total Bilirubin: 0.8 mg/dL (ref 0.3–1.2)

## 2017-04-12 NOTE — Evaluation (Signed)
Speech Language Pathology Assessment and Plan  Patient Details  Name: Ebony Williams MRN: [id number] Date of Birth: 1950-09-16  SLP Diagnosis: Aphasia;Dysarthria;Dysphagia;Cognitive Impairments  Rehab Potential: Good ELOS: 21 days     Today's Date: 04/12/2017 SLP Individual Time: 1000-1100 SLP Individual Time Calculation (min): 60 min   Problem List:  Patient Active Problem List   Diagnosis Date Noted  . Transaminitis   . Essential hypertension   . Right hemiplegia ([place])   . Hypertensive crisis   . 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 Depression   . Dysphagia, post-stroke   . Diastolic dysfunction   . Benign essential HTN   . Prediabetes   . Leukocytosis   . 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 Stroke (cerebrum) ([place]) 04/06/2017  . 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 Pain in joint, shoulder region 10/18/2011   Past Medical History:  Past Medical History:  Diagnosis Date  . History of kidney stones    x1  . Hypertension    Past Surgical History:  Past Surgical History:  Procedure Laterality Date  . CHOLECYSTECTOMY     laparoscopic  . COLONOSCOPY WITH PROPOFOL N/A 01/12/2015   Procedure: COLONOSCOPY WITH PROPOFOL;  Surgeon: Garlan Fair, MD;  Location: [facility]COPY;  Service: Endoscopy;  Laterality: N/A;  . TUBAL LIGATION      Assessment / Plan / Recommendation Clinical Impression   Ebony R Springsis a 67 y.o.right handed femalewith history of hypertension. Per chart reviewand husband,patient lives with spouse. Reported to be independent prior to admission. 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 One level home with 3 steps to entry. Family in the area question 24 hour assistance. Presented 04/06/2017 with right sided weakness, left gaze and aphasia. CT head reviewed, showing left basal ganglia infarct. Per report, subacute left-sided basal ganglia nonhemorrhagic infarction. Patient did  receive TPA. CT angiogram head and neck with no hemodynamically significant stenosis or acute vascular process. 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 Echocardiogram with ejection fraction of 37% grade 1 diastolic dysfunction without embolism. Neurology consulted presently on aspirin as well as Plavix for CVA prophylaxis. Dysphagia #1 nectar thick liquid diet. Physical occupational therapy evaluations completed 04/09/2017 with recommendations of physical medicine rehabilitation consult. Patient was admitted for a comprehensive rehabilitation program.  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  Pt can follow 1 and 2 step commands in a functional context with min-mod assist verbal cues for initiation and sequencing.  She can name basic objects and complete automatic sequences with max assist verbal cues to recognize and correct perseverative, groping, verbal errors. Pt has decreased vocal intensity as well as harsh breathy vocal quality which impacts her intelligibility at the word/phrase level.   Pt also has moderate cognitive impairments characterized by impaired task initiation and sequencing, right inattention, and decreased sustained attention to tasks.  As a result, pt needed max assist to complete basic, familiar tasks.  Pt does have decreased alertness but was able to remain awake throughout evaluation with frequent stimulation and cognitive demand placed on pt by therapist.   Pt presents with ongoing improvements in automaticity of swallowing mechanism.  She needs intermittent mod assist cues for attention to boluses when consuming advanced solids.  She demonstrated immediate  coughing with large consecutive boluses of thin liquids but not with mod-max cues for slow,  controlled cup sips.  With nectar thick liquids, pt did not demonstrate any overt s/s of aspiration, even when challenged with larger, consecutive sips.  For now, recommend that pt remain on currently prescribed diet but SLP is hopeful that as mentation continues to improve that pt's diet will be able to be advanced quickly.   Given the abovementioned deficits, pt would benefit from intensive ST while inpatient in order to maximize functional independence and reduce burden of care prior to discharge.  Anticipate that pt will need 24/7 supervision at discharge in addition to [place] follow up at next level of care.     Skilled Therapeutic Interventions          Cognitive-linguistic and bedside swallow evaluation completed with results and recommendations reviewed with family.     SLP Assessment  Patient will need skilled Speech Lanaguage Pathology Services during CIR admission    Recommendations  SLP Diet Recommendations: Dysphagia 1 (Puree);Nectar Liquid Administration via: Cup Medication Administration: Crushed with puree Supervision: Full supervision/cueing for compensatory strategies;Patient able to self feed Compensations: Slow rate;Minimize environmental distractions;Small sips/bites Postural Changes and/or Swallow Maneuvers: Seated upright 90 degrees;Out of bed for meals Oral Care Recommendations: Oral care BID Patient destination: Home Follow up Recommendations: Home Health SLP;Outpatient SLP;24 hour supervision/assistance Equipment Recommended: To be determined    SLP Frequency 3 to 5 out of 7 days   SLP Duration  SLP Intensity  SLP Treatment/Interventions 21 days   Minumum of 1-2 x/day, 30 to 90 minutes  Cognitive remediation/compensation;Cueing hierarchy;Dysphagia/aspiration precaution training;Environmental controls;Internal/external aids;Multimodal communication approach;Speech/Language facilitation;Patient/family education;Functional tasks    Pain Pain Assessment Pain  Assessment: No/denies pain Faces Pain Scale: No hurt PAINAD (Pain Assessment in Advanced Dementia) Breathing: normal Negative Vocalization: none Facial Expression: smiling or inexpressive Body Language: relaxed Consolability: no need to console PAINAD Score: 0  Prior Functioning Cognitive/Linguistic Baseline: Within functional limits Type of Home: House  Lives With: Spouse Available Help at Discharge: Family;Available 24 hours/day Vocation: Other (comment)  Function:  Eating Eating   Modified Consistency Diet: Yes Eating Assist Level: Set up assist for;Supervision or verbal cues;Help managing cup/glass   Eating Set Up Assist For: Opening containers       Cognition Comprehension Comprehension assist level: Understands basic 50 - 74% of the time/ requires cueing 25 - 49% of the time  Expression   Expression assist level: Expresses basic 25 - 49% of the time/requires cueing 50 - 75% of the time. Uses single words/gestures.  Social Interaction Social Interaction assist level: Interacts appropriately 25 - 49% of time - Needs frequent redirection.  Problem Solving Problem solving assist level: Solves basic 25 - 49% of the time - needs direction more than half the time to initiate, plan or complete simple activities  Memory Memory assist level: Recognizes or recalls less than 25% of the time/requires cueing greater than 75% of the time   Short Term Goals: Week 1: SLP Short Term Goal 1 (Week 1): Pt will verbally convey her needs and wants to caregivers at the word level in 50% of opportunities with mod assist verbal cues. SLP Short Term Goal 2 (Week 1): Pt will name basic, familiar objects for 75% accuracy with mod assist verbal cues for word finding.  SLP Short Term Goal 3 (Week 1): Pt will locate items to the right of midline during basic, familiar tasks in 50% of opportunities with mod assist verbal cues.  SLP Short Term Goal 4 (Week 1): Pt will sustain her attention to basic,  familiar tasks for 3 minute intervals with mod assist verbal cues for redirection.   SLP Short Term Goal 5 (Week 1): Pt will consume dys 1 textures and nectar thick liquids with min assist verbal cues for use of swallowing precautions.  SLP Short Term Goal 6 (Week 1): Pt will consume therapeutic trials of thin liquids with min assist verbal cues for use of swallowing precautions.    Refer to Care Plan for Long Term Goals  Recommendations for other services: None   Discharge Criteria: Patient will be discharged from SLP if patient refuses treatment 3 consecutive times without medical reason, if treatment goals not met, if there is a change in medical status, if patient makes no progress towards goals or if patient is discharged from hospital.  The above assessment, treatment plan, treatment alternatives and goals were discussed and mutually agreed upon: by patient and by family  Ayse Mccartin, Selinda Orion 04/12/2017, 1:46 PM

## 2017-04-12 NOTE — Progress Notes (Signed)
Patient was noted to be very sleepy/lethargic. Was given in report by previous nurse that this was reported to be her baseline. She had not eaten her dinner & was reported that she would not stay alert enough to eat her meal. Was noted awake previously to medication administration time. She was non-verbal & nodded her head to answer questions. By the time this nurse got back to her, she was asleep. She woke enough to open her eyes, but then closed them back & fell asleep while nurse was talking to her. Vitals were taken & only value that was not WNL was her blood pressure, which was reported earlier to elevated earlier on first shift to where she was given labetalol. On her present MAR, there was not any scheduled or prn medication that could be given at the time. Patient had an incontinent episode & she was given incontinence care, but kept her eyes closed. She perked up a little after turning her to lift her left arm & leg, but did not respond or say anything. She again opened her eyes & closed them back. Her bedtime medication was held. Assessment was performed but patient slept through it. She had some coarse sounds to the LUL & made snoring noises. Respiratory therapist came to apply CPAP, but did not due to patient not staying woke up enough for instruction. She recommended a continuous pulse ox and oxygen tonight & re-evaluation if oxygen saturations decrease. Was stated that the patient's husband was going to bring home CPAP machine today. She did not use any on the previous unit, but was on oxygen. Prior to calling the provider, patient's daughter arrived & was informed of situations. She was able to get her mother up enough to eat some of her meal. She mostly drank & just took a few bites. Provider was called & informed of findings. A verbal order was made for prn hypertensive and orders for oxygen & pulse oximeter were given. By the time the pharmacy got the order & it pulled, patient was asleep again.  Daughter woke her up again with some coaxing & she took the catapress. Patient is sleeping at this time with daughter at bedside. No signs of distress noted. Will continue to monitor

## 2017-04-12 NOTE — Evaluation (Signed)
Occupational Therapy Assessment and Plan  Patient Details  Name: Ebony Williams MRN: [id number] Date of Birth: 1950-05-19  OT Diagnosis: apraxia, cognitive deficits, disturbance of vision, flaccid hemiplegia and hemiparesis, hemiplegia affecting dominant side and muscle weakness (generalized) Rehab Potential: Rehab Potential (ACUTE ONLY): Fair ELOS: 21-24 days   Today's Date: 04/12/2017 OT Individual Time: 1430-1530 OT Individual Time Calculation (min): 60 min     Problem List:  Patient Active Problem List   Diagnosis Date Noted  . Transaminitis   . Essential hypertension   . 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 Hypertensive crisis   . 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 Depression   . Dysphagia, post-stroke   . Diastolic dysfunction   . Benign essential HTN   . Prediabetes   . Leukocytosis   . 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) s/p IV tPA 04/06/2017  . Stroke (cerebrum) ([place]) 04/06/2017  . 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 Pain in joint, shoulder region 10/18/2011    Past Medical History:  Past Medical History:  Diagnosis Date  . History of kidney stones    x1  . Hypertension    Past Surgical History:  Past Surgical History:  Procedure Laterality Date  . CHOLECYSTECTOMY     laparoscopic  . COLONOSCOPY WITH PROPOFOL N/A 01/12/2015   Procedure: COLONOSCOPY WITH PROPOFOL;  Surgeon: Garlan Fair, MD;  Location: [facility];  Service: Endoscopy;  Laterality: N/A;  . TUBAL LIGATION      Assessment & Plan Clinical Impression: Patient is a 67 y.o. right handed femalewith history of hypertension. Per chart reviewand husband,patient lives with spouse. Reported to be independent prior to admission. 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 One level home with 3 steps to entry. Family in the area question 24 hour assistance. Presented 04/06/2017 with right sided weakness, left gaze and aphasia. CT head reviewed, showing left basal  ganglia infarct. Per report, subacute left-sided basal ganglia nonhemorrhagic infarction. Patient did receive TPA. CT angiogram head and neck with no hemodynamically significant stenosis or acute vascular process. 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 Echocardiogram with ejection fraction of 60% grade 1 diastolic dysfunction without embolism. Neurology consulted presently on aspirin as well as Plavix for CVA prophylaxis. Dysphagia #1 nectar thick liquid diet. Physical occupational therapy evaluations completed 04/09/2017 with recommendations of physical medicine rehabilitation consult. Patient was admitted for a comprehensive rehabilitation program.   Patient transferred to CIR on 04/11/2017 .    Patient currently requires total with basic self-care skills secondary to muscle weakness, decreased oxygen support, impaired timing and sequencing, abnormal tone, unbalanced muscle activation, motor apraxia, decreased coordination and decreased motor planning, decreased visual perceptual skills, decreased attention to right, decreased initiation, decreased attention, decreased awareness, decreased problem solving and decreased safety awareness and decreased standing balance, decreased postural control, hemiplegia and decreased balance strategies.  Prior to hospitalization, patient could complete ADLs with independent .  Patient will benefit from skilled intervention to decrease level of assist with basic self-care skills prior to discharge home with care partner.  Anticipate patient will require 24 hour supervision and minimal physical assistance and follow up home health.  OT - End of Session Activity Tolerance: Tolerates 30+ min activity with multiple rests Endurance Deficit: Yes Endurance Deficit Description: requires cues to remain awake and engaged during therapy session OT  Assessment Rehab  Potential (ACUTE ONLY): Fair OT Barriers to Discharge: Incontinence;Inaccessible home environment;Decreased caregiver support OT Barriers to Discharge Comments: husband works but reports he has sick leave he can use OT Patient demonstrates impairments in the following area(s): Balance;Cognition;Endurance;Motor;Perception;Safety;Sensory;Skin Integrity;Vision OT Basic ADL's Functional Problem(s): Eating;Grooming;Bathing;Dressing;Toileting OT Transfers Functional Problem(s): Toilet;Tub/Shower OT Additional Impairment(s): Fuctional Use of Upper Extremity OT Plan OT Intensity: Minimum of 1-2 x/day, 45 to 90 minutes OT Frequency: 5 out of 7 days OT Duration/Estimated Length of Stay: 21-24 days OT Treatment/Interventions: Balance/vestibular training;Cognitive remediation/compensation;Discharge planning;DME/adaptive equipment instruction;Disease mangement/prevention;Functional electrical stimulation;Functional mobility training;Neuromuscular re-education;Pain management;Patient/family education;Psychosocial support;Self Care/advanced ADL retraining;Skin care/wound managment;Splinting/orthotics;Therapeutic Activities;Therapeutic Exercise;UE/LE Strength taining/ROM;UE/LE Coordination activities;Visual/perceptual remediation/compensation;Wheelchair propulsion/positioning OT Self Feeding Anticipated Outcome(s): Clinical research associate Self-Care Anticipated Outcome(s): Min assist OT Toileting Anticipated Outcome(s): Min assist OT Bathroom Transfers Anticipated Outcome(s): Min assist OT Recommendation Patient destination: Home Follow Up Recommendations: 24 hour supervision/assistance;Home health OT Equipment Recommended: 3 in 1 bedside comode;To be determined   Skilled Therapeutic Intervention OT eval completed with discussion of rehab process, OT purpose, POC, ELOS, and goals.  ADL assessment completed with bathing LB at bed level and seated EOB for UB bathing.  Pt required increased time and  cues to open eyes to participate in eval/treatment.  Pt able to follow one step commands approx 50% of time with increased time.  Demonstrated good sitting balance at EOB, however required tactile cues for sequencing with UB bathing as pt demonstrating perseveration with washing face.  Engaged in sit > stand with +2 for safety, pt demonstrating good initiation and ability to bear weight through RLE.  Pt participation limited due to lethargy and aphasia with no verbalizations.  Husband present throughout, providing majority of pt history.  OT Evaluation Precautions/Restrictions  Precautions Precautions: Fall Precaution Comments: right hemiplegia and neglect General   Vital Signs Therapy Vitals Temp: (!) 97.5 F (36.4 C) Temp Source: Oral Pulse Rate: 82 Resp: 16 BP: 139/82 Oxygen Therapy SpO2: 91 % O2 Device: Room Air Pain Pain Assessment Pain Assessment: No/denies pain Home Living/Prior Functioning Home Living Family/patient expects to be discharged to:: Private residence Living Arrangements: Spouse/significant other Available Help at Discharge: Family, Available 24 hours/day Type of Home: House Home Access: Stairs to enter [employer] of Steps: 3 to front entrance, 5 in garage (primary) entrance Entrance Stairs-Rails: None Home Layout: Able to live on main level with bedroom/bathroom Bathroom Shower/Tub: (has built in seat) Biochemist, clinical: Standard Bathroom Accessibility: Yes Additional Comments: spouse reports he had 3 in 1 and RW previously that are in storage, may be available to her  Lives With: Spouse IADL History Homemaking Responsibilities: Yes Meal Prep Responsibility: Primary Laundry Responsibility: Primary Cleaning Responsibility: Primary Prior Function Level of Independence: Independent with gait, Independent with transfers, Independent with basic ADLs, Independent with homemaking with ambulation  Able to Take Stairs?: Yes Driving: Yes 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 ADL  See Function Navigator Vision Baseline Vision/History: Wears glasses Wears Glasses: At all times Patient Visual Report: (Pt nonverbal) Vision Assessment?: Vision impaired- to be further tested in functional context(difficult to assess due to decreased arousal/fatigue) Additional Comments: left gaze preference but able to scan to Rt to locate stimulus with increased time Praxis Praxis: Impaired Praxis Impairment Details: Motor planning Cognition Overall Cognitive Status: Impaired/Different from baseline Arousal/Alertness: Lethargic Orientation Level: (responds (opens eyes) to name) Memory: (difficult to assess due to language impairment, suspect impaired) Attention: Sustained Focused Attention: Appears intact Sustained Attention: Impaired Sustained Attention Impairment: Verbal basic;Functional basic Awareness: Impaired  Awareness Impairment: Intellectual impairment Problem Solving: Impaired Problem Solving Impairment: Verbal basic;Functional basic Executive Function: (all impaired due to lower level deficits ) Safety/Judgment: Impaired Comments: unable to complete BIMS secondary to lethargy and nonverbal Sensation Sensation Light Touch: Impaired Detail Light Touch Impaired Details: Absent RUE Proprioception: Impaired Detail Proprioception Impaired Details: Absent RUE Coordination Gross Motor Movements are Fluid and Coordinated: No Fine Motor Movements are Fluid and Coordinated: No Coordination and Movement Description: difficulty with assessment due to lethargy and aphasia, however pt grossly flaccid in RUE and RLE Extremity/Trunk Assessment RUE Assessment RUE Assessment: Exceptions to [facility] RUE AROM (degrees) Overall AROM Right Upper Extremity: Deficits(flaccid) RUE PROM (degrees) Overall PROM Right Upper Extremity: Within functional limits for tasks performed(mild grimace with shoulder flexion > 90*, however  able to tolerate full ROM) RUE Tone RUE Tone: Flaccid;Modified [place] LUE Assessment LUE Assessment: Within Functional Limits(WFL, strength grossly 4/5 difficult to assess due to lethargy and aphasia)   See Function Navigator for Current Functional Status.   Refer to Care Plan for Long Term Goals  Recommendations for other services: None    Discharge Criteria: Patient will be discharged from OT if patient refuses treatment 3 consecutive times without medical reason, if treatment goals not met, if there is a change in medical status, if patient makes no progress towards goals or if patient is discharged from hospital.  The above assessment, treatment plan, treatment alternatives and goals were discussed and mutually agreed upon: by patient and by family  Ellwood Dense [facility] 04/12/2017, 3:24 PM

## 2017-04-12 NOTE — Progress Notes (Signed)
Social Work Assessment and Plan  Patient Details  Name: Ebony Williams MRN: [id number] Date of Birth: [birth year]  Today's Date: 04/12/2017  Problem List:  Patient Active Problem List   Diagnosis Date Noted  . Transaminitis   . Essential hypertension   . Right hemiplegia ([place])   . Hypertensive crisis   . 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 Depression   . Dysphagia, post-stroke   . Diastolic dysfunction   . Benign essential HTN   . Prediabetes   . Leukocytosis   . 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 Stroke (cerebrum) ([place]) 04/06/2017  . 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 Pain in joint, shoulder region 10/18/2011   Past Medical History:  Past Medical History:  Diagnosis Date  . History of kidney stones    x1  . Hypertension    Past Surgical History:  Past Surgical History:  Procedure Laterality Date  . CHOLECYSTECTOMY     laparoscopic  . COLONOSCOPY WITH PROPOFOL N/A 01/12/2015   Procedure: COLONOSCOPY WITH PROPOFOL;  Surgeon: Garlan Fair, MD;  Location: [facility];  Service: Endoscopy;  Laterality: N/A;  . TUBAL LIGATION     Social History:  reports that  has never smoked. she has never used smokeless tobacco. She reports that she drinks alcohol. She reports that she does not use drugs.  Family / Support Systems Marital Status: Married How Long?: 28 years Patient Roles: Spouse, Parent, Caregiver Spouse/Significant Other: Saria Haran - husband - [phone]) [phone] (h); [phone] (has pt's mobile) Children: [place] - dtr - [phone] Anticipated Caregiver: spouse and daughter Ability/Limitations of Caregiver: spouse no limitations, but does still work some for [employer]; daughter works during the day Caregiver Availability: 24/7 Family Dynamics: Pt's husband is the stepfather of her children, but married pt when they were young.  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  They care for dtr's children while she works. Guardian/Conservator: Pt's husband is pt's POA and medical POA, so he is pt's decision maker while she is unable to make her own decisions.   Abuse/Neglect Abuse/Neglect Assessment Can Be Completed: Unable to assess, patient is non-responsive or altered mental status  Emotional Status Pt's affect, behavior and adjustment status: Pt was asleep most of CSW's visit and when CSW tried to talk with pt prior to husband arriving, she had a very difficult time expressing herself. Recent Psychosocial Issues: Pt is legal guardian of her son's son (17 y/o).  Husband and pt's dtr sometimes do not see eye to eye on things. Psychiatric History: none reported Substance Abuse History: none reported  Patient / Family Perceptions, Expectations & Goals Pt/Family understanding of illness & functional limitations: Husband feels he hasn't fully had his medical questions answered, but when CSW asks him if he wants to speak to our PA or MD, he stated what he really wanted to know is how much pt will regain/how independent she will be/how much care will she need.  CSW explaiend Rehab Team will help him with this during her stay.  He seemed satisfied with that. Premorbid pt/family roles/activities: Pt/husband have not been able to do much since they are helping to care for grandchildren a lot.  Husband feels this has  been too stressful for pt.   Anticipated changes in roles/activities/participation: To be determined based on pt's progress, per husband. Pt/family expectations/goals: Husband would like for pt to regain some level of being able to care for herself.  Community Resources Express Scripts:  None Premorbid Home Care/DME Agencies: None Transportation available at discharge: husband Resource referrals recommended: Neuropsychology, Support group (specify)  Discharge Planning Living Arrangements: Spouse/significant other Support Systems: Spouse/significant other, Children Type of Residence: Private residence Insurance Resources: Multimedia programmer (specify)(United Electrical engineer) Financial Resources: Radio broadcast assistant Screen Referred: No Money Management: Spouse Does the patient have any problems obtaining your medications?: No Home Management: Pt took care of inside of home and husband fixes things and takes care of outside of home. Patient/Family Preliminary Plans: Pt's husband can be available to pt 24/7.  Dtr will help when she is not working. Social Work Anticipated Follow Up Needs: Support Group, HH/OP Expected length of stay: ELOS 19 to 24 days  Clinical Impression CSW met with pt briefly and then with her husband to introduce self and role of CSW, as well as to complete assessment.  Pt slept during that second visit.  Husband is prepared to take care of pt at home, but wonders what amount of care she will need.  CSW explained that therapists will help him with this during pt's stay and that today we are just meeting her and assessing her and determining appropriate goals and estimated LOS.  He expressed understanding.  Pt's dtr is also involved, but husband has POA/healthcare POA, so he is Media planner.  He works some still for the [employer], but doesn't have to.  Pt was caring for three grandchildren and husband wonders if this has been too stressful for pt.  CSW will visit with pt when she is awake and can communicate more with CSW.  CSW will continue to follow and assist as needed.  Laren Whaling, [name] 04/12/2017, 4:10 PM

## 2017-04-12 NOTE — Progress Notes (Signed)
Orthopedic Tech Progress Note Patient Details:  Ebony Williams [birth year] [id number]  Patient ID: Ebony Williams, female   DOB: [birth year], 67 y.o.   MRN: [id number]   [name] 04/12/2017, 9:19 AM Called in hanger brace order; spoke with Caryl Pina

## 2017-04-12 NOTE — Progress Notes (Addendum)
Inpatient Rehabilitation Center Individual Statement of Services  Patient Name:  Ebony Williams  Date:  04/12/2017  Welcome to the [facility].  Our goal is to provide you with an individualized program based on your diagnosis and situation, designed to meet your specific needs.  With this comprehensive rehabilitation program, you will be expected to participate in at least 3 hours of rehabilitation therapies Monday-Friday, with modified therapy programming on the weekends.  Your rehabilitation program will include the following services:  Physical Therapy (PT), Occupational Therapy (OT), Speech Therapy (ST), 24 hour per day rehabilitation nursing, Neuropsychology, Case Management (Social Worker), Rehabilitation Medicine, Nutrition Services and Pharmacy Services  Weekly team conferences will be held on Wednesdays to discuss your progress.  Your Social Worker will talk with you frequently to get your input and to update you on team discussions.  Team conferences with you and your family in attendance may also be held.  Expected length of stay:  18-24 days  Overall anticipated outcome:  Minimal assistance with moderate assistance for ambulation  Depending on your progress and recovery, your program may change. Your Social Worker will coordinate services and will keep you informed of any changes. Your Social Worker's name and contact numbers are listed  below.  The following services may also be recommended but are not provided by the [facility] will be made to provide these services after discharge if needed.  Arrangements include referral to agencies that provide these services.  Your insurance has been verified to be:  [employer] Your primary doctor is:  Dr. Sharilyn Sites  Pertinent information will be shared with your  doctor and your insurance company.  Social Worker:  Alfonse Alpers, LCSW  [phone] or ([phone]  Information discussed with and copy given to patient by: Trey Sailors, 04/12/2017, 3:50 PM

## 2017-04-12 NOTE — Progress Notes (Signed)
Physical Therapy Assessment and Plan  Patient Details  Name: Ebony Williams MRN: [id number] Date of Birth: [birth year]  PT Diagnosis: Abnormal posture, Cognitive deficits, Coordination disorder, Difficulty walking, Hemiparesis dominant, Impaired cognition, Impaired sensation and Muscle weakness Rehab Potential: Good ELOS: 18-21 days   Today's Date: 04/12/2017 PT Individual Time: 0800-0910 PT Individual Time Calculation (min): 70 min   Problem List:  Patient Active Problem List   Diagnosis Date Noted  . Transaminitis   . Essential hypertension   . Right hemiplegia ([place])   . Hypertensive crisis   . 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 Depression   . Dysphagia, post-stroke   . Diastolic dysfunction   . Benign essential HTN   . Prediabetes   . Leukocytosis   . 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 Stroke (cerebrum) ([place]) 04/06/2017  . 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 Pain in joint, shoulder region 10/18/2011    Past Medical History:  Past Medical History:  Diagnosis Date  . History of kidney stones    x1  . Hypertension    Past Surgical History:  Past Surgical History:  Procedure Laterality Date  . CHOLECYSTECTOMY     laparoscopic  . COLONOSCOPY WITH PROPOFOL N/A 01/12/2015   Procedure: COLONOSCOPY WITH PROPOFOL;  Surgeon: Garlan Fair, MD;  Location: [facility];  Service: Endoscopy;  Laterality: N/A;  . TUBAL LIGATION      Assessment & Plan Clinical Impression: [place] 67 y.o.right handed femalewith history of hypertension. Per chart reviewand husband,patient lives with spouse. Reported to be independent prior to admission. 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 One level home with 3 steps to entry. Family in the area question 24 hour assistance. Presented 04/06/2017 with right sided weakness, left gaze and aphasia. CT head reviewed, showing left basal ganglia infarct. Per  report, subacute left-sided basal ganglia nonhemorrhagic infarction. Patient did receive TPA. CT angiogram head and neck with no hemodynamically significant stenosis or acute vascular process. 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 Echocardiogram with ejection fraction of 70% grade 1 diastolic dysfunction without embolism. Neurology consulted presently on aspirin as well as Plavix for CVA prophylaxis. Dysphagia #1 nectar thick liquid diet. Physical occupational therapy evaluations completed 04/09/2017 with recommendations of physical medicine rehabilitation consult. Patient was admitted for a comprehensive rehabilitation program  Patient transferred to CIR on 04/11/2017 .   Patient currently requires total with mobility secondary to muscle weakness, impaired timing and sequencing, abnormal tone, motor apraxia, decreased coordination and decreased motor planning, decreased midline orientation, decreased attention to right, right side neglect and decreased motor planning and decreased initiation, decreased attention, decreased awareness, decreased problem solving and decreased safety awareness.  Prior to hospitalization, patient was independent  with mobility and lived with Spouse in a House home.  Home access is 3Stairs to enter.  Patient will benefit from skilled PT intervention to maximize safe functional mobility, minimize fall risk and decrease caregiver burden for planned discharge home with 24 hour assist.  Anticipate patient will benefit from follow up [facility] at discharge.  PT - End of Session Activity Tolerance: Tolerates 10 - 20 min activity with multiple rests Endurance Deficit: Yes Endurance Deficit Description: (needs cues to stay awake during therapy session) PT Assessment Rehab Potential (ACUTE/IP ONLY): Good PT Barriers to Discharge: Decreased caregiver  support;Home  environment access/layout PT Plan PT Intensity: Minimum of 1-2 x/day ,45 to 90 minutes PT Frequency: 5 out of 7 days PT Duration Estimated Length of Stay: 18-21 days PT Treatment/Interventions: Ambulation/gait training;Balance/vestibular training;Cognitive remediation/compensation;Community reintegration;Discharge planning;DME/adaptive equipment instruction;Functional electrical stimulation;Functional mobility training;Neuromuscular re-education;Patient/family education;Psychosocial support;Splinting/orthotics;Stair training;Therapeutic Activities;Therapeutic Exercise;UE/LE Strength taining/ROM;UE/LE Coordination activities;Visual/perceptual remediation/compensation;Wheelchair propulsion/positioning PT Recommendation Follow Up Recommendations: Home health PT;24 hour supervision/assistance Patient destination: Home Equipment Recommended: To be determined  Skilled Therapeutic Intervention Evaluation completed (see details above and below) with education on PT POC and goals and individual treatment initiated with focus on functional transfers, sitting balance, and midline reorientation. Pt is Max Assist for squat pivot transfer to the L from bed to w/c. Pt is Min Assist to stand to stedy, transfer w/c to bed with use of stedy and assist x 2 due to sitting balance deficits, pt requires Min Assist to maintain sitting balance. Pt remains non-verbal throughout session and is able to nod "yes" to "yes/no" questions but unable to determine accuracy of answers.  PT Evaluation Precautions/Restrictions Precautions Precautions: Fall Precaution Comments: right hemiplegia and neglect Restrictions Weight Bearing Restrictions: No Home Living/Prior Functioning Home Living Available Help at Discharge: Family;Available 24 hours/day Type of Home: House Home Access: Stairs to enter [employer] of Steps: 3 Entrance Stairs-Rails: None Home Layout: Able to live on main level with  bedroom/bathroom Bathroom Shower/Tub: (has built in seat) Biochemist, clinical: Standard Bathroom Accessibility: Yes Additional Comments: spouse reports he had 3 in 1 and RW previously that are in storage, may be available to her  Lives With: Spouse Prior Function Level of Independence: Independent with gait;Independent with transfers  Able to Take Stairs?: Yes Driving: Yes Vocation: Other (comment) 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 Vision/Perception  Vision - History Baseline Vision: Wears glasses all the time Vision - Assessment Additional Comments: left gaze preference but able to scan to Rt to locate stimulus with increased time Perception Perception: Impaired Inattention/Neglect: Does not attend to right visual field;Does not attend to right side of body Praxis Praxis: Impaired Praxis Impairment Details: Motor planning  Cognition Overall Cognitive Status: Impaired/Different from baseline Arousal/Alertness: Lethargic Orientation Level: Oriented to person Attention: Focused;Sustained Focused Attention: Appears intact Sustained Attention: Appears intact Sustained Attention Impairment: Verbal basic;Functional basic Memory: (unable to assess) Awareness: Impaired Awareness Impairment: Intellectual impairment Problem Solving: Impaired Problem Solving Impairment: Verbal basic;Functional basic Executive Function: (all impaired due to lower level deficits ) Safety/Judgment: Impaired Comments: right inattention Sensation Sensation Light Touch: (unable to assess secondary to cognitive deficits) Light Touch Impaired Details: Absent RUE Proprioception: (unable to assess secondary to cognitive deficits) Proprioception Impaired Details: Absent RUE Coordination Gross Motor Movements are Fluid and Coordinated: No Fine Motor Movements are Fluid and Coordinated: No Coordination and Movement Description: (unable to formally assess secondary to cognitive deficits) Motor   Motor Motor: Hemiplegia;Motor apraxia  Balance Balance Balance Assessed: Yes Static Sitting Balance Static Sitting - Balance Support: Left upper extremity supported;Feet supported Static Sitting - Level of Assistance: 4: Min assist Static Standing Balance Static Standing - Balance Support: Left upper extremity supported Static Standing - Level of Assistance: 4: Min assist Extremity Assessment  RUE Assessment RUE Assessment: Exceptions to [facility] RUE AROM (degrees) Overall AROM Right Upper Extremity: Deficits(flaccid) RUE PROM (degrees) Overall PROM Right Upper Extremity: Within functional limits for tasks performed(mild grimace with shoulder flexion > 90*, however able to tolerate full ROM) RUE Tone RUE Tone: Flaccid;Modified [place] LUE Assessment LUE Assessment: Within Functional Limits(WFL, strength grossly 4/5 difficult to assess  due to lethargy and aphasia) RLE Assessment RLE Assessment: Exceptions to [facility] RLE Strength RLE Overall Strength: Deficits(flaccid) RLE Tone RLE Tone: Hypertonic(plantarflexors) LLE Assessment LLE Assessment: Within Functional Limits   See Function Navigator for Current Functional Status.   Refer to Care Plan for Long Term Goals  Recommendations for other services: None   Discharge Criteria: Patient will be discharged from PT if patient refuses treatment 3 consecutive times without medical reason, if treatment goals not met, if there is a change in medical status, if patient makes no progress towards goals or if patient is discharged from hospital.  The above assessment, treatment plan, treatment alternatives and goals were discussed and mutually agreed upon: No family available/patient unable  Excell Seltzer 04/12/2017, 3:44 PM

## 2017-04-12 NOTE — Progress Notes (Signed)
[facility] PHYSICAL MEDICINE & REHABILITATION     PROGRESS NOTE  Subjective/Complaints:  Patient seen lying in bed this morning. Patient sleepy throughout night and remained sleepy this morning.  ROS: Unable to obtain due to patient being nonverbal  Objective: Vital Signs: Blood pressure (!) 156/78, pulse 73, temperature 98.9 F (37.2 C), temperature source Axillary, resp. rate 18, weight 75.7 kg (166 lb 14.2 oz), SpO2 96 %. No results found. No results for input(s): WBC, HGB, HCT, PLT in the last 72 hours. Recent Labs    04/12/17 0554  NA 142  K 3.9  CL 106  GLUCOSE 124*  BUN 21*  CREATININE 0.65  CALCIUM 9.2   CBG (last 3)  Recent Labs    04/09/17 0820  GLUCAP 129*    Wt Readings from Last 3 Encounters:  04/11/17 75.7 kg (166 lb 14.2 oz)  04/06/17 74.5 kg (164 lb 3.9 oz)  02/28/17 79.4 kg (175 lb)    Physical Exam:  BP (!) 156/78 (BP Location: Left Arm)   Pulse 73   Temp 98.9 F (37.2 C) (Axillary)   Resp 18   Wt 75.7 kg (166 lb 14.2 oz)   SpO2 96%   BMI 31.53 kg/m  Constitutional: She appears well-developed and well-nourished.  HENT: Normocephalic and atraumatic.  Eyes: No discharge. Briefly opens eyes  Cardiovascular: Normal rate and regular rhythm. No JVD. Respiratory: Effort normal and breath sounds normal. + [place]. GI: Bowel sounds are normal. Nondistended.  Musculoskeletal: No edema or tenderness in extremities  Neurological:  Somnolent  Nonverbal Motor: LUE/LLE: 1/5 proximal distal (effort)  RUE/RLE: 0/5 proximal to distal  Skin: Skin is warm and dry.  Psychiatric:  Unable to assess due to mentation/language   Assessment/Plan: 1. 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 Physiatrist is providing close team supervision and 24 hour management of active medical problems listed below. Physiatrist and rehab  team continue to assess barriers to discharge/monitor patient progress toward functional and medical goals.  Function:  Bathing Bathing position      Bathing parts      Bathing assist        Upper Body Dressing/Undressing Upper body dressing                    Upper body assist        Lower Body Dressing/Undressing Lower body dressing                                  Lower body assist        Toileting Toileting          Toileting assist     Transfers Chair/bed transfer             Locomotion Ambulation           Wheelchair          Cognition Comprehension    Expression    Social Interaction Social Interaction assist level: Interacts appropriately 25 - 49% of time - Needs frequent redirection.  Problem Solving    Memory      Medical Problem List and Plan: 1.  Right side weakness with aphasia secondary to multifocal ischemic left MCA and PCA infarcts on 04/06/17   Begin CIR   WHO/PRAFO ordered 3/7 2.  DVT Prophylaxis/Anticoagulation: SCDs. Monitor  for any signs of DVT 3. Pain Management: Tylenol as needed 4. Mood: Effexor 75 mg daily, Xanax 0.5 mg 3 times a day as needed   Will consider neuro stimulant if patient remains somnolent 5. Neuropsych: This patient is not capable of making decisions on his own behalf. 6. Skin/Wound Care: Routine skin checks 7. Fluids/Electrolytes/Nutrition: Routine I&O's   BMP within acceptable range on 3/7 8. Hypertension. HCTZ 12.5 mg daily, Avapro 300 mg daily. Monitor with increased mobility   Hypertensive crisis overnight, monitor with increased mobility 9. Dysphagia. Dysphagia #1 nectar liquids. Follow-up speech therapy 10. Prediabetes   Continue to monitor 11. Transaminitis   Continue to monitor 12. Leukocytosis   WBCs 11.5 on 3/1    Labs pending for today  LOS (Days) 1 A FACE TO FACE EVALUATION WAS PERFORMED  Ankit [name] 04/12/2017 8:19 AM

## 2017-04-12 NOTE — Progress Notes (Signed)
Patient information reviewed and entered into eRehab system by Jerid Catherman, RN, CRRN, PPS Coordinator.  Information including medical coding and functional independence measure will be reviewed and updated through discharge.     Per nursing patient was given "Data Collection Information Summary for Patients in Inpatient Rehabilitation Facilities with attached "Privacy Act Statement-Health Care Records" upon admission.  

## 2017-04-13 ENCOUNTER — Inpatient Hospital Stay (HOSPITAL_COMMUNITY): Payer: Medicare Other | Admitting: Physical Therapy

## 2017-04-13 ENCOUNTER — Inpatient Hospital Stay (HOSPITAL_COMMUNITY): Payer: Medicare Other | Admitting: Speech Pathology

## 2017-04-13 ENCOUNTER — Inpatient Hospital Stay (HOSPITAL_COMMUNITY): Payer: Medicare Other

## 2017-04-13 MED ORDER — ASPIRIN 81 MG PO CHEW
81.0000 mg | CHEWABLE_TABLET | Freq: Every day | ORAL | Status: DC
  Administered 2017-04-13 – 2017-05-08 (×26): 81 mg via ORAL
  Filled 2017-04-13 (×25): qty 1

## 2017-04-13 MED ORDER — CHLORTHALIDONE 25 MG PO TABS
25.0000 mg | ORAL_TABLET | Freq: Every day | ORAL | Status: DC
  Administered 2017-04-13 – 2017-04-16 (×4): 25 mg via ORAL
  Filled 2017-04-13 (×4): qty 1

## 2017-04-13 NOTE — Progress Notes (Signed)
[facility] PHYSICAL MEDICINE & REHABILITATION     PROGRESS NOTE  Subjective/Complaints:  Patient seen lying in bed this morning. No reported issues overnight. Daughter bedside who has questions about recovery.  ROS: Unable to obtain due to patient being language/cognition/behavior  Objective: Vital Signs: Blood pressure (!) 163/78, pulse 79, temperature 98.3 F (36.8 C), temperature source Oral, resp. rate 18, weight 75.7 kg (166 lb 14.2 oz), SpO2 94 %. No results found. Recent Labs    04/12/17 0554  WBC 12.4*  HGB 16.2*  HCT 47.2*  PLT 261   Recent Labs    04/12/17 0554  NA 142  K 3.9  CL 106  GLUCOSE 124*  BUN 21*  CREATININE 0.65  CALCIUM 9.2   CBG (last 3)  No results for input(s): GLUCAP in the last 72 hours.  Wt Readings from Last 3 Encounters:  04/11/17 75.7 kg (166 lb 14.2 oz)  04/06/17 74.5 kg (164 lb 3.9 oz)  02/28/17 79.4 kg (175 lb)    Physical Exam:  BP (!) 163/78 (BP Location: Left Arm)   Pulse 79   Temp 98.3 F (36.8 C) (Oral)   Resp 18   Wt 75.7 kg (166 lb 14.2 oz)   SpO2 94%   BMI 31.53 kg/m  Constitutional: She appears well-developed and well-nourished.  HENT: Normocephalic and atraumatic.  Eyes: No discharge.   Cardiovascular: Normal rate and regular rhythm. No JVD. Respiratory: Effort normal and breath sounds normal. GI: Bowel sounds are normal. Nondistended.  Musculoskeletal: No edema or tenderness in extremities  Neurological:   alert Nonverbal Motor: LUE/LLE: >/3/5 proximal distal (effort)  RUE/RLE: 0/5 proximal to distal  (unchanged) Skin: Skin is warm and dry.  Psychiatric:  Unable to assess due to mentation/language   Assessment/Plan: 1. 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 Physiatrist is providing close team supervision and 24 hour management of active medical problems listed  below. Physiatrist and rehab team continue to assess barriers to discharge/monitor patient progress toward functional and medical goals.  Function:  Bathing Bathing position   Position: Bed  Bathing parts Body parts bathed by patient: Chest Body parts bathed by helper: Right arm, Left arm, Abdomen, Front perineal area, Buttocks, Right upper leg, Left upper leg, Right lower leg, Left lower leg, Back  Bathing assist Assist Level: (Total assist)      Upper Body Dressing/Undressing Upper body dressing   What is the patient wearing?: Hospital gown                Upper body assist Assist Level: (Total assist)      Lower Body Dressing/Undressing Lower body dressing   What is the patient wearing?: Hospital Gown, Non-skid slipper socks           Non-skid slipper socks- Performed by helper: Don/doff right sock, Don/doff left sock                  Lower body assist Assist for lower body dressing: (Total assist)      Toileting Toileting          Toileting assist     Transfers Chair/bed transfer   Chair/bed transfer method: Stand pivot Chair/bed transfer assist level: 2 helpers Chair/bed transfer assistive device: Mechanical lift Mechanical lift: Stedy   Locomotion Ambulation Ambulation activity did not occur: Clinical research associate activity  did not occur: Safety/medical concerns Type: Manual      Cognition Comprehension Comprehension assist level: Understands basic 50 - 74% of the time/ requires cueing 25 - 49% of the time  Expression Expression assist level: Expresses basis less than 25% of the time/requires cueing >75% of the time.  Social Interaction Social Interaction assist level: Interacts appropriately 25 - 49% of time - Needs frequent redirection.  Problem Solving Problem solving assist level: Solves basic less than 25% of the time - needs direction nearly all the time or does not effectively solve problems and may  need a restraint for safety  Memory Memory assist level: Recognizes or recalls less than 25% of the time/requires cueing greater than 75% of the time    Medical Problem List and Plan: 1.  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  DVT Prophylaxis/Anticoagulation: SCDs. Monitor for any signs of DVT 3. Pain Management: Tylenol as needed 4. Mood: Effexor 75 mg daily, Xanax 0.5 mg 3 times a day as needed   Will consider neuro stimulant if patient remains somnolent 5. Neuropsych: This patient is not capable of making decisions on his own behalf. 6. Skin/Wound Care: Routine skin checks 7. Fluids/Electrolytes/Nutrition: Routine I&O's   BMP within acceptable range on 3/7 8. Hypertension.    HCTZ 12.5 mg daily, changed to hygrotin 25 mg on 3/8   Avapro 300 mg daily. Monitor with increased mobility 9. Dysphagia. Dysphagia #1 nectar liquids. Follow-up speech therapy 10. Prediabetes   Continue to monitor 11. Transaminitis   Continue to monitor   Labs ordered for Monday 12. Leukocytosis   WBCs 12.4 on 3/7   UA ordered   Labs ordered for Monday  LOS (Days) 2 A FACE TO FACE EVALUATION WAS PERFORMED  Ebony Williams [name] 04/13/2017 8:22 AM

## 2017-04-13 NOTE — Progress Notes (Signed)
Patient was a little more alert than the previous night. Aroused to voice, was able to take her oral medications without difficulty and fluids were offered & accepted. No signs of pain or distress. Oxygen therapy was in progress at 2 lpm by nasal canula, but patient took it off this morning. Tried to reapply but she removed it again. Pulse oximeter is on & O2 stas are WNL.Respiratory therapist came to the floor last night to apply the CPAP machine but patients husband only brought the mask. Settings unknown & patient was unable to verbalize instructions, so oxygen & pulse ox were used last night. Will continue to monitor.

## 2017-04-13 NOTE — Progress Notes (Signed)
Speech Language Pathology Daily Session Note  Patient Details  Name: Ebony Williams MRN: [id number] Date of Birth: 1951/02/03  Today's Date: 04/13/2017 SLP Individual Time: 3557-3220 SLP Individual Time Calculation (min): 71 min  Short Term Goals: Week 1: SLP Short Term Goal 1 (Week 1): Pt will verbally convey her needs and wants to caregivers at the word level in 50% of opportunities with mod assist verbal cues. SLP Short Term Goal 2 (Week 1): Pt will name basic, familiar objects for 75% accuracy with mod assist verbal cues for word finding.  SLP Short Term Goal 3 (Week 1): Pt will locate items to the right of midline during basic, familiar tasks in 50% of opportunities with mod assist verbal cues.  SLP Short Term Goal 4 (Week 1): Pt will sustain her attention to basic, familiar tasks for 3 minute intervals with mod assist verbal cues for redirection.   SLP Short Term Goal 5 (Week 1): Pt will consume dys 1 textures and nectar thick liquids with min assist verbal cues for use of swallowing precautions.  SLP Short Term Goal 6 (Week 1): Pt will consume therapeutic trials of thin liquids with min assist verbal cues for use of swallowing precautions.    Skilled Therapeutic Interventions:  Pt was seen for skilled ST targeting cognitive goals.  Pt was soundly asleep upon therapist's arrival but awakened sufficiently to meaningfully participate in ST once sitting at edge of bed.  Pt follow 1 and 2 step commands when transferring to wheelchair as well as in the context of feeding herself lunch and brushing her teeth after meal with mod assist verbal cues.  Pt consumed dys 1 textures and nectar thick liquids with mod assist multimodal cues for rate and portion control.  No overt s/s of aspiration were evident with solids or liquids.  Pt could verbally indicate preference from a choice of two with min assist verbal cues; however, her voice remains dysphonic.  Pt was incontinent of bowel during therapist's  session and she did not initiate request to use the bathroom until it was too late.  Pt could assist in hygiene and donning clean brief with mod-max assist mulitmodal cues for following commands.  Family members were present at the end of today's therapy session and presented with questions regarding pt's current level of function.  SLP provided extensive skilled education regarding current goals and results of evaluation. SLP also discussed techniques to maximize pt's alertness and capacity for functional communication, emphasizing balancing periods of wakefulness with times of rest. Pt was left in bed with bed alarm set and call bell within reach.  Continue per current plan of care.   Function:  Eating Eating   Modified Consistency Diet: Yes Eating Assist Level: Set up assist for;Supervision or verbal cues;Help managing cup/glass   Eating Set Up Assist For: Opening containers       Cognition Comprehension Comprehension assist level: Understands basic 75 - 89% of the time/ requires cueing 10 - 24% of the time  Expression   Expression assist level: Expresses basic 25 - 49% of the time/requires cueing 50 - 75% of the time. Uses single words/gestures.  Social Interaction Social Interaction assist level: Interacts appropriately 25 - 49% of time - Needs frequent redirection.  Problem Solving Problem solving assist level: Solves basic 25 - 49% of the time - needs direction more than half the time to initiate, plan or complete simple activities  Memory Memory assist level: Recognizes or recalls 25 - 49% of the time/requires  cueing 50 - 75% of the time    Pain Pain Assessment Pain Assessment: No/denies pain  Therapy/Group: Individual Therapy  Kenyetta Fife, Selinda Orion 04/13/2017, 3:12 PM

## 2017-04-13 NOTE — Progress Notes (Signed)
Occupational Therapy Session Note  Patient Details  Name: Ebony Williams MRN: [id number] Date of Birth: 1950/10/25  Today's Date: 04/13/2017 OT Individual Time: 3295-1884 OT Individual Time Calculation (min): 56 min    Short Term Goals: Week 1:  OT Short Term Goal 1 (Week 1): Pt will complete bathing with mod assist and mod cues for sequencing OT Short Term Goal 2 (Week 1): Pt will complete UB dressing with mod assist OT Short Term Goal 3 (Week 1): Pt will complete toilet transfer with max assist of 1 caregiver OT Short Term Goal 4 (Week 1): Pt will complete LB dressing with max assist of one caregiver  Skilled Therapeutic Interventions/Progress Updates:    1:1. Pt arousal waxing/waning throughout session but able to alert with VC. Pt dons pants supine in bed rolling to R with touching A for reaching hand to rail and L with MOD A to advance pant past hips. Pt transfers in stedy with min A for sit to stand in stedy and VC pursed lip breathing after standing when O2 drops. Sats WNL with breathing technique. Pt bathes UB at sink with A to wash back and buttocks. Pt requires HOH A to cross midline and grab needed bathing/grooming items and tactile cues for head turns when scanning. Pt dons pull over shirt with MOD A and demonstrational cues for sequencing d/t aphasia. Pt combs/blow dries hair with A from OT and exited session with pt seated in w/c, QRB donned, and RUE arm suported on lap tray with call light in reach  Therapy Documentation Precautions:  Precautions Precautions: Fall Precaution Comments: right hemiplegia and neglect Restrictions Weight Bearing Restrictions: No  See Function Navigator for Current Functional Status.   Therapy/Group: Individual Therapy  Tonny Branch 04/13/2017, 10:00 AM

## 2017-04-13 NOTE — Progress Notes (Signed)
Physical Therapy Note  Patient Details  Name: ZILPHA MCANDREW MRN: [id number] Date of Birth: May 11, 1950 Today's Date: 04/13/2017    Time: 1045-1145 60 minutes  1:1 No c/o pain. Pt asleep upon PT arrival but easily aroused by voice.  Pt performs sit to stand in stedy with mod A, manual facilitation for pelvic alignment and Rt knee control.  Pt performs wt shifts in standing with mod/max manual facilitation. Seated in stedy pt performs reaching task with focus on balance and righting reactions with mod A.  Squat pivot transfer training throughout session with pt requiring max A for transfer to Lt, pt unable to sequence transfer to Rt despite max cuing.  Seated balance with reaching activity with cues for pt to follow object with eyes and head and to place objects on Lt or Rt side. Pt requires max facilitation to look with head to Rt. Pt performs seated ball tap for balance with close supervision.  Sit to stand repetitions from mat with mod A, manual facilitation for Rt knee and pelvis control. Pt with improved tolerance to activity and good participation during session.  Pt left in bed with husband present, needs at hand, positioned in sidelying on Lt per pt request for comfort.   Treven Holtman 04/13/2017, 1:57 PM

## 2017-04-14 ENCOUNTER — Inpatient Hospital Stay (HOSPITAL_COMMUNITY): Payer: Medicare Other | Admitting: Occupational Therapy

## 2017-04-14 ENCOUNTER — Other Ambulatory Visit: Payer: Self-pay

## 2017-04-14 ENCOUNTER — Inpatient Hospital Stay (HOSPITAL_COMMUNITY): Payer: Medicare Other

## 2017-04-14 ENCOUNTER — Inpatient Hospital Stay (HOSPITAL_COMMUNITY): Payer: Medicare Other | Admitting: Physical Therapy

## 2017-04-14 LAB — URINALYSIS, COMPLETE (UACMP) WITH MICROSCOPIC
Bilirubin Urine: NEGATIVE
GLUCOSE, UA: NEGATIVE mg/dL
Hgb urine dipstick: NEGATIVE
Ketones, ur: NEGATIVE mg/dL
NITRITE: NEGATIVE
PH: 5 (ref 5.0–8.0)
PROTEIN: NEGATIVE mg/dL
Specific Gravity, Urine: 1.028 (ref 1.005–1.030)
Squamous Epithelial / LPF: NONE SEEN

## 2017-04-14 NOTE — Progress Notes (Signed)
Speech Language Pathology Daily Session Note  Patient Details  Name: Ebony Williams MRN: [id number] Date of Birth: 1950/05/31  Today's Date: 04/14/2017 SLP Individual Time: 1435-1500 SLP Individual Time Calculation (min): 25 min  Short Term Goals: Week 1: SLP Short Term Goal 1 (Week 1): Pt will verbally convey her needs and wants to caregivers at the word level in 50% of opportunities with mod assist verbal cues. SLP Short Term Goal 2 (Week 1): Pt will name basic, familiar objects for 75% accuracy with mod assist verbal cues for word finding.  SLP Short Term Goal 3 (Week 1): Pt will locate items to the right of midline during basic, familiar tasks in 50% of opportunities with mod assist verbal cues.  SLP Short Term Goal 4 (Week 1): Pt will sustain her attention to basic, familiar tasks for 3 minute intervals with mod assist verbal cues for redirection.   SLP Short Term Goal 5 (Week 1): Pt will consume dys 1 textures and nectar thick liquids with min assist verbal cues for use of swallowing precautions.  SLP Short Term Goal 6 (Week 1): Pt will consume therapeutic trials of thin liquids with min assist verbal cues for use of swallowing precautions.    Skilled Therapeutic Interventions: Pt seen this date to address dysphagia and speech + language goals. SLP facilitated session by providing Max A verbal + visual cues, including written cues (word choices written in field of 3), sentence completion, and phonemic cues to complete confrontational naming task with LARK kit items with 100% accuracy; pt only 20% accurate with Mod I. Pt benefited from extra processing time, intermittent model prompts, and repetition of instructions when asked to follow 1-step commands; without cues, pt followed commands within context ~50% of the time. Pt located correct word for names of items and family members when presented in field of 3 given Max A verbal cues with pt selecting words within R and L field, as well as  midline given Max A verbal cues. Pt sustained attention for the duration of therapeutic tasks with redirection x 1 due to lethargy noted at the onset of today's session. Following oral care, provided therapeutic trials of tsp sips of thin liquid with +s/sx c/f aspiration, including wet vocal quality, on 1 out 6 trials. Pt with weak, unproductive cough at this time. Pt left upright in bed with husband present and all needs within reach, including call bell. SLP to continue current POC.     Function:  Eating Eating     Eating Assist Level: Set up assist for;Supervision or verbal cues;Help managing cup/glass           Cognition Comprehension Comprehension assist level: Understands basic 75 - 89% of the time/ requires cueing 10 - 24% of the time  Expression   Expression assist level: Expresses basic 25 - 49% of the time/requires cueing 50 - 75% of the time. Uses single words/gestures.  Social Interaction Social Interaction assist level: Interacts appropriately 25 - 49% of time - Needs frequent redirection.  Problem Solving Problem solving assist level: Solves basic 25 - 49% of the time - needs direction more than half the time to initiate, plan or complete simple activities  Memory Memory assist level: Recognizes or recalls 25 - 49% of the time/requires cueing 50 - 75% of the time    Pain Pain Assessment Pain Assessment: No/denies pain  Therapy/Group: Individual Therapy  Trevon Strothers A Yanette Tripoli 04/14/2017, 3:52 PM

## 2017-04-14 NOTE — Progress Notes (Signed)
Occupational Therapy Session Note  Patient Details  Name: Ebony Williams MRN: [id number] Date of Birth: 30-Dec-1950  Today's Date: 04/14/2017 OT Individual Time: 1102-1200 and 1302-1330 OT Individual Time Calculation (min): 58 min and 28 min   Short Term Goals: Week 1:  OT Short Term Goal 1 (Week 1): Pt will complete bathing with mod assist and mod cues for sequencing OT Short Term Goal 2 (Week 1): Pt will complete UB dressing with mod assist OT Short Term Goal 3 (Week 1): Pt will complete toilet transfer with max assist of 1 caregiver OT Short Term Goal 4 (Week 1): Pt will complete LB dressing with max assist of one caregiver  Skilled Therapeutic Interventions/Progress Updates:    1) Treatment session with focus on arousal, visual scanning to Rt, and weight bearing through RUE during reaching activity.  Pt asleep upon arrival, unable to arouse even with cool wash cloth.  Required repositioning in bed with mod assist for rolling to don pants and max assist sidelying to sitting on Rt side of bed.  Engaged in visual scanning and reaching activity with focus on visual scanning to Rt to locate specific items and then reach across midline to obtain item while weight bearing through RUE with increased WB with reaching.  Utilized colored bean bags for reaching with focus on following 1-2 step commands during reaching.  Pt often closing eyes throughout activity, requiring max cues to remain engaged.  Returned to bed and positioned with pillows under RUE to improve positioning to decrease shoulder subluxation and injury to RUE.  Provided pt with handout for proper positioning and placed over [place].  2) Treatment session with focus on visual scanning, arousal, and RUE NMR.  Pt asleep in bed upon arrival, however opened eyes and scanned towards therapist in response to knock on the door.  Pt awake initially during session, however falling asleep easily with difficulty remaining aroused.  Engaged in [place] in  supine with focus on full ROM.  Noted increased tone in all joints, therefore engaged in ROM at shoulder, elbow, and wrist to attempt to decrease tone.  Educated pt's husband on tone and positioning with husband reporting understanding.  Therapy Documentation Precautions:  Precautions Precautions: Fall Precaution Comments: right hemiplegia and neglect Restrictions Weight Bearing Restrictions: No Pain: Pain Assessment Pain Assessment: No/denies pain  See Function Navigator for Current Functional Status.   Therapy/Group: Individual Therapy  Simonne Come 04/14/2017, 12:16 PM

## 2017-04-14 NOTE — Progress Notes (Signed)
[place] PHYSICAL MEDICINE & REHABILITATION     PROGRESS NOTE  Subjective/Complaints:  Pt seen lying in bed this AM.  She whispers more consistently now, stating she slept well overnight.  ROS: Denies CP, SOB, N/V/D.  Objective: Vital Signs: Blood pressure (!) 160/73, pulse 84, temperature 97.6 F (36.4 C), temperature source Axillary, resp. rate (!) 21, weight 75.7 kg (166 lb 14.2 oz), SpO2 94 %. No results found. Recent Labs    04/12/17 0554  WBC 12.4*  HGB 16.2*  HCT 47.2*  PLT 261   Recent Labs    04/12/17 0554  NA 142  K 3.9  CL 106  GLUCOSE 124*  BUN 21*  CREATININE 0.65  CALCIUM 9.2   CBG (last 3)  No results for input(s): GLUCAP in the last 72 hours.  Wt Readings from Last 3 Encounters:  04/11/17 75.7 kg (166 lb 14.2 oz)  04/06/17 74.5 kg (164 lb 3.9 oz)  02/28/17 79.4 kg (175 lb)    Physical Exam:  BP (!) 160/73 (BP Location: Left Arm)   Pulse 84   Temp 97.6 F (36.4 C) (Axillary)   Resp (!) 21   Wt 75.7 kg (166 lb 14.2 oz)   SpO2 94%   BMI 31.53 kg/m  Constitutional: She appears well-developed and well-nourished.  HENT: Normocephalic and atraumatic.  Eyes: No discharge.   Cardiovascular: RRR. No JVD. Respiratory: Effort normal and breath sounds normal. GI: Bowel sounds are normal. Nondistended.  Musculoskeletal: No edema or tenderness in extremities  Neurological:  Alert Nonverbal Motor: LUE/LLE: 4-/5 proximal distal  RUE/RLE: 0/5 proximal to distal  (stable) Skin: Skin is warm and dry.  Psychiatric:  Unable to assess due to mentation/language   Assessment/Plan: 1. 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 Physiatrist is providing close team supervision and 24 hour management of active medical problems listed below. Physiatrist and rehab team continue to assess barriers to discharge/monitor  patient progress toward functional and medical goals.  Function:  Bathing Bathing position   Position: Bed  Bathing parts Body parts bathed by patient: Chest, Abdomen, Right arm Body parts bathed by helper: Front perineal area, Buttocks, Right upper leg, Left upper leg, Right lower leg, Left lower leg, Back, Left arm  Bathing assist Assist Level: (MAX A)      Upper Body Dressing/Undressing Upper body dressing   What is the patient wearing?: Pull over shirt/dress     Pull over shirt/dress - Perfomed by patient: Thread/unthread left sleeve, Put head through opening Pull over shirt/dress - Perfomed by helper: Thread/unthread right sleeve, Pull shirt over trunk        Upper body assist Assist Level: (Total assist)      Lower Body Dressing/Undressing Lower body dressing   What is the patient wearing?: Pants       Pants- Performed by helper: Thread/unthread right pants leg, Thread/unthread left pants leg, Pull pants up/down   Non-skid slipper socks- Performed by helper: Don/doff right sock, Don/doff left sock                  Lower body assist Assist for lower body dressing: (Total assist)      Toileting Toileting Toileting activity did not occur: No continent bowel/bladder event        Toileting assist     Transfers Chair/bed transfer   Chair/bed transfer method: Squat pivot Chair/bed transfer assist level:  Maximal assist (Pt 25 - 49%/lift and lower) Chair/bed transfer assistive device: Mechanical lift Mechanical lift: Stedy   Locomotion Ambulation Ambulation activity did not occur: Safety/medical concerns   Max distance: 10 Assist level: Maximal assist (Pt 25 - 49%)   Wheelchair Wheelchair activity did not occur: Safety/medical concerns Type: Manual      Cognition Comprehension Comprehension assist level: Understands basic 75 - 89% of the time/ requires cueing 10 - 24% of the time  Expression Expression assist level: Expresses basic 25 - 49% of the  time/requires cueing 50 - 75% of the time. Uses single words/gestures.  Social Interaction Social Interaction assist level: Interacts appropriately 25 - 49% of time - Needs frequent redirection.  Problem Solving Problem solving assist level: Solves basic 25 - 49% of the time - needs direction more than half the time to initiate, plan or complete simple activities  Memory Memory assist level: Recognizes or recalls 25 - 49% of the time/requires cueing 50 - 75% of the time    Medical Problem List and Plan: 1.  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  DVT Prophylaxis/Anticoagulation: SCDs. Monitor for any signs of DVT 3. Pain Management: Tylenol as needed 4. Mood: Effexor 75 mg daily, Xanax 0.5 mg 3 times a day as needed   Will consider neuro stimulant if patient remains somnolent 5. Neuropsych: This patient is not capable of making decisions on his own behalf. 6. Skin/Wound Care: Routine skin checks 7. Fluids/Electrolytes/Nutrition: Routine I&O's   BMP within acceptable range on 3/7 8. Hypertension.    HCTZ 12.5 mg daily, changed to hygrotin 25 mg on 3/8   Avapro 300 mg daily.    Monitor with increased mobility   Labile on 3/9 9. Dysphagia. Dysphagia #1 nectar liquids. Follow-up speech therapy 10. Prediabetes   Continue to monitor 11. Transaminitis   Continue to monitor   Labs ordered for Monday 12. Leukocytosis   WBCs 12.4 on 3/7   UA equivocal, Ucx ordered   Labs ordered for Monday  LOS (Days) 3 A FACE TO FACE EVALUATION WAS PERFORMED  Ebony Williams Ebony Williams 04/14/2017 12:57 PM

## 2017-04-14 NOTE — Progress Notes (Signed)
Physical Therapy Session Note  Patient Details  Name: Ebony Williams MRN: [id number] Date of Birth: [birth year]  Today's Date: 04/14/2017 PT Individual Time: 0802-0915 PT Individual Time Calculation (min): 73 min   Short Term Goals: Week 1:  PT Short Term Goal 1 (Week 1): Pt will maintain sitting balance EOB with CGA PT Short Term Goal 2 (Week 1): Pt will perform squat pivot transfer Mod A consistently PT Short Term Goal 3 (Week 1): Pt will initiate w/c propulsion training  Skilled Therapeutic Interventions/Progress Updates:   Pt received supine in bed, asleep, easily aroused, but noted to have increased lethargy prior to OOB activity. Pt agreeable to PT. Supine>sit transfer with mod assist and moderate multimodal cues for sequencing and safety cues. Sitting balance EOB with supervision assist while PT assisted pt to don shoes.    Min-mod assist stedy transfer to [facility] with min cues for posture and positioning. PT pulled pants to waist while pt stood in stedy with min assist.   Forced attention to the R for eating task while sitting in WC. Pt able to attend to R 50% of the time throughout meal while using LUE to manage food on fork.   Sit<>stand at rail in hall x 2 with mod assist. Moderate multimodal cues for set up and sequencing to come to standing.  Gait at rail in hall x 21f with max assist. Max cues for posture, and gait pattern with assist to advance the RLE and prevent GR on the R knee.   Squat pivot to and from nustep with max assist max cues for set up and techique to improve use of the RLE in trasnfer.   Nustep reciprocal movement training BLE only 2 x 2.5 minutes with moderate assist from PT throughout to improve activation of the RLE. .[name]  [facility], call bell in reach and all needs met.          Therapy Documentation Precautions:  Precautions Precautions: Fall Precaution Comments: right hemiplegia and  neglect Restrictions Weight Bearing Restrictions: No   Pain: Pain Assessment Pain Assessment: No/denies pain   See Function Navigator for Current Functional Status.   Therapy/Group: Individual Therapy  ALorie Phenix3/10/2017, 10:01 AM

## 2017-04-14 NOTE — Progress Notes (Signed)
Patient lethargic during this shift. She would awaken briefly when addressed. Bedtime medications were not given as they could not be safely administered.

## 2017-04-15 ENCOUNTER — Inpatient Hospital Stay (HOSPITAL_COMMUNITY): Payer: Medicare Other | Admitting: Occupational Therapy

## 2017-04-15 ENCOUNTER — Inpatient Hospital Stay (HOSPITAL_COMMUNITY): Payer: Medicare Other | Admitting: Physical Therapy

## 2017-04-15 DIAGNOSIS — R5383 Other fatigue: Secondary | ICD-10-CM

## 2017-04-15 LAB — URINE CULTURE: Culture: NO GROWTH

## 2017-04-15 MED ORDER — AMANTADINE HCL 100 MG PO CAPS
100.0000 mg | ORAL_CAPSULE | Freq: Two times a day (BID) | ORAL | Status: DC
  Filled 2017-04-15 (×2): qty 1

## 2017-04-15 MED ORDER — AMANTADINE HCL 100 MG PO CAPS
100.0000 mg | ORAL_CAPSULE | Freq: Two times a day (BID) | ORAL | Status: DC
  Administered 2017-04-15: 100 mg via ORAL

## 2017-04-15 MED ORDER — AMANTADINE HCL 100 MG PO CAPS
100.0000 mg | ORAL_CAPSULE | Freq: Two times a day (BID) | ORAL | Status: DC
  Administered 2017-04-15 – 2017-04-25 (×20): 100 mg via ORAL
  Filled 2017-04-15 (×21): qty 1

## 2017-04-15 NOTE — Progress Notes (Signed)
Occupational Therapy Session Note  Patient Details  Name: Ebony Williams MRN: [id number] Date of Birth: 1951/01/11  Today's Date: 04/15/2017 OT Individual Time: 5208-0223 OT Individual Time Calculation (min): 46 min   Short Term Goals: Week 1:  OT Short Term Goal 1 (Week 1): Pt will complete bathing with mod assist and mod cues for sequencing OT Short Term Goal 2 (Week 1): Pt will complete UB dressing with mod assist OT Short Term Goal 3 (Week 1): Pt will complete toilet transfer with max assist of 1 caregiver OT Short Term Goal 4 (Week 1): Pt will complete LB dressing with max assist of one caregiver  Skilled Therapeutic Interventions/Progress Updates:    Pt greeted supine in bed with spouse present. Appearing more alert, and looked towards doorway when therapist entered. She asked quietly "what are we going to do?" Pt completed supine<sit with Mod A for Lt side. Sit<stand in [place] with Min A. R UE very hypertonic, with limb pressed to abdomen. It was too painful for her to extend it towards Stedy bar during transfer. Once pt was in w/c, her spouse reported she liked having her hair done. Tx focus on Rt attention, awareness, alertness, and following 1 step instruction during meaningful self care task. Pt scanning to Rt side of sink with max verbal/auditory cues to locate and reach for shampoo and conditioner. Pt lathering up hair with L UE with min vcs to attend to Rt side of head. Pt appearing very calm while OT rinsed hair, R UE tone markedly improved at this point and OT was able to provide gentle stretching while pt combed her hair. Pt often reached for incorrect items, but able to shake her head no when asked if it was the item OT asked her to retrieve, then was successful by 2nd or 3rd attempt. After donning clean shirt with instruction on hemi techniques (pt able to complete 1/4 components!), she was left in w/c with spouse present. Half lap tray applied to w/c and safety belt fastened.  Advised spouse to propel pt down the hall for increasing stimulation/alertness as well as providing gentle stretching to R UE for contracture mgt. He verbalized understanding. Pt left with spouse.   02 sats monitored throughout, remaining between 96-100% on RA.    Therapy Documentation Precautions:  Precautions Precautions: Fall Precaution Comments: right hemiplegia and neglect Restrictions Weight Bearing Restrictions: No Vital Signs: Therapy Vitals Temp: (!) 97.4 F (36.3 C) Temp Source: Oral Pulse Rate: 94 Resp: 20 BP: (!) 161/76 Patient Position (if appropriate): Lying Oxygen Therapy SpO2: 96 % O2 Device: Room Air Pain: Pt reporting pain with initial R UE movement. During gentle stretches, pt reporting no pain   ADL:      See Function Navigator for Current Functional Status.  Therapy/Group: Individual Therapy  Tamlyn Sides A Tynlee Bayle 04/15/2017, 4:29 PM

## 2017-04-15 NOTE — Progress Notes (Signed)
Physical Therapy Session Note  Patient Details  Name: Ebony Williams MRN: [id number] Date of Birth: 1950/10/10  Today's Date: 04/15/2017 PT Individual Time: 0900-0948 PT Individual Time Calculation (min): 48 min   Short Term Goals: Week 1:  PT Short Term Goal 1 (Week 1): Pt will maintain sitting balance EOB with CGA PT Short Term Goal 2 (Week 1): Pt will perform squat pivot transfer Mod A consistently PT Short Term Goal 3 (Week 1): Pt will initiate w/c propulsion training  Skilled Therapeutic Interventions/Progress Updates:  Pt was seen bedside in the am. Pt rolled L side with max A and siderail. Pt rolled R with side rail and mod A. Pt transferred supine to edge of bed with head of bed elevated, side rail and mod A. Pt transferred edge of bed to w/c to L with mod A and verbal cues. Pt transported to gym. Pt stood in parallel bars x 2 with mod A and verbal cues. While standing focused on weight shifting and midline. Pt performed stand pivot transfer to L with mod A and to the R with max A and verbal cues. Pt stood with hemiwalker x 2 and mod A with verbal cues. While standing worked on weight shifting and pre gait activities. Pt returned to room following treatment and left sitting up in w/c with lap tray and quick release belt in place. [name] at bedside and call bell within reach.   Therapy Documentation Precautions:  Precautions Precautions: Fall Precaution Comments: right hemiplegia and neglect Restrictions Weight Bearing Restrictions: No General:   Vital Signs: Room air, O2 sats 93%  Pain: No c/o pain.  See Function Navigator for Current Functional Status.   Therapy/Group: Individual Therapy  Dub Amis 04/15/2017, 7:57 AM

## 2017-04-15 NOTE — Progress Notes (Signed)
[place] PHYSICAL MEDICINE & REHABILITATION     PROGRESS NOTE  Subjective/Complaints:  Patient seen lying in bed this morning.  She slept well overnight.  Preformed about lethargy, medication initiated.  ROS: Denies CP, SOB, N/V/D.  Objective: Vital Signs: Blood pressure (!) 163/71, pulse 80, temperature 97.8 F (36.6 C), temperature source Axillary, resp. rate (!) 22, weight 75.7 kg (166 lb 14.2 oz), SpO2 94 %. No results found. No results for input(s): WBC, HGB, HCT, PLT in the last 72 hours. No results for input(s): NA, K, CL, GLUCOSE, BUN, CREATININE, CALCIUM in the last 72 hours.  Invalid input(s): CO CBG (last 3)  No results for input(s): GLUCAP in the last 72 hours.  Wt Readings from Last 3 Encounters:  04/11/17 75.7 kg (166 lb 14.2 oz)  04/06/17 74.5 kg (164 lb 3.9 oz)  02/28/17 79.4 kg (175 lb)    Physical Exam:  BP (!) 163/71 (BP Location: Left Arm)   Pulse 80   Temp 97.8 F (36.6 C) (Axillary)   Resp (!) 22   Wt 75.7 kg (166 lb 14.2 oz)   SpO2 94%   BMI 31.53 kg/m  Constitutional: She appears well-developed and well-nourished.  HENT: Normocephalic and atraumatic.  Eyes: No discharge.   Cardiovascular: RRR. No JVD. Respiratory: Effort normal and breath sounds normal. GI: Bowel sounds are normal. Nondistended.  Musculoskeletal: No edema or tenderness in extremities  Neurological:  Alert Nonverbal Motor: LUE/LLE: 4-/5 proximal distal  RUE/RLE: 0/5 proximal to distal  (unchanged) Skin: Skin is warm and dry.  Psychiatric: Flat.  Assessment/Plan: 1. 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 Physiatrist is providing close team supervision and 24 hour management of active medical problems listed below. Physiatrist and rehab team continue to assess barriers to discharge/monitor patient progress toward functional and  medical goals.  Function:  Bathing Bathing position   Position: Bed  Bathing parts Body parts bathed by patient: Chest, Abdomen, Right arm Body parts bathed by helper: Front perineal area, Buttocks, Right upper leg, Left upper leg, Right lower leg, Left lower leg, Back, Left arm  Bathing assist Assist Level: (MAX A)      Upper Body Dressing/Undressing Upper body dressing   What is the patient wearing?: Pull over shirt/dress     Pull over shirt/dress - Perfomed by patient: Thread/unthread left sleeve, Put head through opening Pull over shirt/dress - Perfomed by helper: Thread/unthread right sleeve, Pull shirt over trunk        Upper body assist Assist Level: (Total assist)      Lower Body Dressing/Undressing Lower body dressing   What is the patient wearing?: Pants       Pants- Performed by helper: Thread/unthread right pants leg, Thread/unthread left pants leg, Pull pants up/down   Non-skid slipper socks- Performed by helper: Don/doff right sock, Don/doff left sock                  Lower body assist Assist for lower body dressing: (Total assist)      Toileting Toileting Toileting activity did not occur: No continent bowel/bladder event        Toileting assist     Transfers Chair/bed transfer   Chair/bed transfer method: Stand pivot Chair/bed transfer assist level: Maximal assist (Pt 25 - 49%/lift and lower) Chair/bed transfer assistive device: Mechanical lift Mechanical lift: Stedy   Locomotion Ambulation Ambulation activity did not  occur: Safety/medical concerns   Max distance: 10 Assist level: Maximal assist (Pt 25 - 49%)   Wheelchair Wheelchair activity did not occur: Safety/medical concerns Type: Manual      Cognition Comprehension Comprehension assist level: Understands basic 75 - 89% of the time/ requires cueing 10 - 24% of the time  Expression Expression assist level: Expresses basic 25 - 49% of the time/requires cueing 50 - 75% of the  time. Uses single words/gestures.  Social Interaction Social Interaction assist level: Interacts appropriately 25 - 49% of time - Needs frequent redirection.  Problem Solving Problem solving assist level: Solves basic 25 - 49% of the time - needs direction more than half the time to initiate, plan or complete simple activities  Memory Memory assist level: Recognizes or recalls 25 - 49% of the time/requires cueing 50 - 75% of the time    Medical Problem List and Plan: 1.  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  DVT Prophylaxis/Anticoagulation: SCDs. Monitor for any signs of DVT 3. Pain Management: Tylenol as needed 4. Mood: Effexor 75 mg daily, Xanax 0.5 mg 3 times a day as needed   Will consider neuro stimulant if patient remains somnolent   Amantadine 100 mg with breakfast and lunch started on 3/9 5. Neuropsych: This patient is not capable of making decisions on his own behalf. 6. Skin/Wound Care: Routine skin checks 7. Fluids/Electrolytes/Nutrition: Routine I&O's   BMP within acceptable range on 3/7 8. Hypertension.    HCTZ 12.5 mg daily, changed to hygrotin 25 mg on 3/8   Avapro 300 mg daily.    Monitor with increased mobility   Remains elevated on 3/10, will consider further increase in medication tomorrow 9. Dysphagia. Dysphagia #1 nectar liquids. Follow-up speech therapy 10. Prediabetes   Continue to monitor 11. Transaminitis   Continue to monitor   Labs ordered for tomorrow 12. Leukocytosis   WBCs 12.4 on 3/7   UA equivocal, Ucx no growth   Labs ordered for tomorrow  LOS (Days) 4 A FACE TO FACE EVALUATION WAS PERFORMED  Kailan Laws [name] 04/15/2017 2:04 PM

## 2017-04-16 ENCOUNTER — Inpatient Hospital Stay (HOSPITAL_COMMUNITY): Payer: Medicare Other | Admitting: Occupational Therapy

## 2017-04-16 ENCOUNTER — Inpatient Hospital Stay (HOSPITAL_COMMUNITY): Payer: Medicare Other

## 2017-04-16 ENCOUNTER — Inpatient Hospital Stay (HOSPITAL_COMMUNITY): Payer: Medicare Other | Admitting: Physical Therapy

## 2017-04-16 ENCOUNTER — Inpatient Hospital Stay (HOSPITAL_COMMUNITY): Payer: Medicare Other | Admitting: Speech Pathology

## 2017-04-16 LAB — CBC WITH DIFFERENTIAL/PLATELET
BASOS PCT: 0 %
Basophils Absolute: 0 10*3/uL (ref 0.0–0.1)
Eosinophils Absolute: 0.1 10*3/uL (ref 0.0–0.7)
Eosinophils Relative: 1 %
HEMATOCRIT: 49.4 % — AB (ref 36.0–46.0)
HEMOGLOBIN: 16.8 g/dL — AB (ref 12.0–15.0)
LYMPHS PCT: 19 %
Lymphs Abs: 2.4 10*3/uL (ref 0.7–4.0)
MCH: 31.5 pg (ref 26.0–34.0)
MCHC: 34 g/dL (ref 30.0–36.0)
MCV: 92.7 fL (ref 78.0–100.0)
MONO ABS: 0.7 10*3/uL (ref 0.1–1.0)
MONOS PCT: 5 %
NEUTROS ABS: 9.8 10*3/uL — AB (ref 1.7–7.7)
NEUTROS PCT: 75 %
Platelets: 256 10*3/uL (ref 150–400)
RBC: 5.33 MIL/uL — ABNORMAL HIGH (ref 3.87–5.11)
RDW: 13.7 % (ref 11.5–15.5)
WBC: 13.1 10*3/uL — ABNORMAL HIGH (ref 4.0–10.5)

## 2017-04-16 LAB — COMPREHENSIVE METABOLIC PANEL
ALK PHOS: 72 U/L (ref 38–126)
ALT: 76 U/L — ABNORMAL HIGH (ref 14–54)
ANION GAP: 12 (ref 5–15)
AST: 70 U/L — AB (ref 15–41)
Albumin: 3.2 g/dL — ABNORMAL LOW (ref 3.5–5.0)
BILIRUBIN TOTAL: 0.7 mg/dL (ref 0.3–1.2)
BUN: 22 mg/dL — AB (ref 6–20)
CALCIUM: 9.2 mg/dL (ref 8.9–10.3)
CO2: 24 mmol/L (ref 22–32)
Chloride: 100 mmol/L — ABNORMAL LOW (ref 101–111)
Creatinine, Ser: 0.74 mg/dL (ref 0.44–1.00)
GFR calc Af Amer: >60 mL/min (ref >60)
GLUCOSE: 204 mg/dL — AB (ref 65–99)
Potassium: 3.7 mmol/L (ref 3.5–5.1)
Sodium: 136 mmol/L (ref 135–145)
TOTAL PROTEIN: 7.5 g/dL (ref 6.5–8.1)

## 2017-04-16 LAB — GLUCOSE, CAPILLARY: GLUCOSE-CAPILLARY: 200 mg/dL — AB (ref 65–99)

## 2017-04-16 MED ORDER — CHLORTHALIDONE 25 MG PO TABS
25.0000 mg | ORAL_TABLET | Freq: Once | ORAL | Status: AC
  Administered 2017-04-16: 25 mg via ORAL
  Filled 2017-04-16: qty 1

## 2017-04-16 MED ORDER — CHLORTHALIDONE 50 MG PO TABS
50.0000 mg | ORAL_TABLET | Freq: Every day | ORAL | Status: DC
  Administered 2017-04-17 – 2017-04-18 (×2): 50 mg via ORAL
  Filled 2017-04-16 (×3): qty 1

## 2017-04-16 NOTE — Progress Notes (Signed)
Speech Language Pathology Daily Session Note  Patient Details  Name: Ebony Williams MRN: [id number] Date of Birth: [birth year]  Today's Date: 04/16/2017 SLP Individual Time: 1245-1330 SLP Individual Time Calculation (min): 45 min  Short Term Goals: Week 1: SLP Short Term Goal 1 (Week 1): Pt will verbally convey her needs and wants to caregivers at the word level in 50% of opportunities with mod assist verbal cues. SLP Short Term Goal 2 (Week 1): Pt will name basic, familiar objects for 75% accuracy with mod assist verbal cues for word finding.  SLP Short Term Goal 3 (Week 1): Pt will locate items to the right of midline during basic, familiar tasks in 50% of opportunities with mod assist verbal cues.  SLP Short Term Goal 4 (Week 1): Pt will sustain her attention to basic, familiar tasks for 3 minute intervals with mod assist verbal cues for redirection.   SLP Short Term Goal 5 (Week 1): Pt will consume dys 1 textures and nectar thick liquids with min assist verbal cues for use of swallowing precautions.  SLP Short Term Goal 6 (Week 1): Pt will consume therapeutic trials of thin liquids with min assist verbal cues for use of swallowing precautions.    Skilled Therapeutic Interventions: Skilled treatment session focused on dysphagia and communication goals. SLP received pt in bed, alert and self-feeding herself lunch tray. Pt consumed puree lunch tray with nectar thick liquids with Min A cues for rate and bolus size. Pt with complete oral clearing and no overt s/s of aspiration. Pt with right anterior spillage that she was not aware of. Given Mod A cues, pt able to select requested object in field of 2 with ~ 75% accuracy. With Max A verbal cues (consisting of sentence complete and then semantic cues) pt able to name object with ~ 25%. Pt with good self-monitoring but unable to self-correct. Encouragement and support given. During unstructured expression, pt with better at conveying message with  nonverbal expression than verbal expression. Pt's verbal communication is garbled and further compromised by decreased (almost aphonic) vocal intensity. Pt was able to sustain attention for ~ 40 minutes. Pt was left upright in bed with father, niece and husband present. Continue per current plan of care.      Function:  Eating Eating   Modified Consistency Diet: Yes Eating Assist Level: Set up assist for;Supervision or verbal cues;Help managing cup/glass   Eating Set Up Assist For: Opening containers       Cognition Comprehension Comprehension assist level: Understands basic 50 - 74% of the time/ requires cueing 25 - 49% of the time  Expression   Expression assist level: Expresses basis less than 25% of the time/requires cueing >75% of the time.  Social Interaction Social Interaction assist level: Interacts appropriately 25 - 49% of time - Needs frequent redirection.  Problem Solving Problem solving assist level: Solves basic 25 - 49% of the time - needs direction more than half the time to initiate, plan or complete simple activities  Memory Memory assist level: Recognizes or recalls less than 25% of the time/requires cueing greater than 75% of the time    Pain Pain Assessment Pain Assessment: No/denies pain Pain Score: 0-No pain  Therapy/Group: Individual Therapy  Cliffard Hair 04/16/2017, 1:31 PM

## 2017-04-16 NOTE — Progress Notes (Signed)
Physical Therapy Note  Patient Details  Name: Ebony Williams MRN: [id number] Date of Birth: March 25, 1950 Today's Date: 04/16/2017    Time: [phone] 25 minutes  1:1 No c/o pain. Pt very lethargic, requires max/total A for rolling for hygiene, total A to don pants in supine.  Pt opens eyes and nods in agreement to sitting up with PT.  Pt performs supine to sit edge of bed with mod A.  Sitting edge of bed pt requires mod A for balance. Pt then becomes flushed and has decreased responsiveness.  Pt layed back down. BP 142/67, spO2 93%, HR 88bpm.  Pt not opening eyes to daughters voice, pt able to squeeze RNs fingers and begins to open eyes.  RN and PA made aware.  Pt left in bed with daughter and RN present.   Maddalyn Lutze 04/16/2017, 10:16 AM

## 2017-04-16 NOTE — Progress Notes (Signed)
Occupational Therapy Session Note  Patient Details  Name: Ebony Williams MRN: [id number] Date of Birth: Jul 26, 1950  Today's Date: 04/16/2017 OT Individual Time: 1130-1202 OT Individual Time Calculation (min): 32 min   Short Term Goals: Week 1:  OT Short Term Goal 1 (Week 1): Pt will complete bathing with mod assist and mod cues for sequencing OT Short Term Goal 2 (Week 1): Pt will complete UB dressing with mod assist OT Short Term Goal 3 (Week 1): Pt will complete toilet transfer with max assist of 1 caregiver OT Short Term Goal 4 (Week 1): Pt will complete LB dressing with max assist of one caregiver  Skilled Therapeutic Interventions/Progress Updates:    Pt greeted post procedure with RN consent to see for therapy. Family was present and pt opening eyes about 5% of time during session with cuing. Worked on [employer]/contracture mgt by providing sensory feedback via arm/hand hygiene, lotion application, and gentle joint compressions/stretches. Emphasized with family importance of ER and wrist flexion/extension PROM for them to complete outside of therapies. Tried to increase pt alertness by listening to favorite music and also using peppermint oil via inhalation (dtr reported pt uses aromatherapy greatly at home). These interventions did not appear to affect alertness today. At session exit pt was left with visitors present.   Therapy Documentation Precautions:  Precautions Precautions: Fall Precaution Comments: right hemiplegia and neglect, extensor tone in the left elbow and in the shoulder Restrictions Weight Bearing Restrictions: No Vital Signs: Therapy Vitals Pulse Rate: 88 Resp: 20 BP: (!) 142/67 Oxygen Therapy SpO2: 93 % O2 Device: Room Air Pain: No s/s pain during session  Pain Assessment Pain Assessment: No/denies pain Pain Score: 0-No pain ADL:     See Function Navigator for Current Functional Status.   Therapy/Group: Individual Therapy  Destine Ambroise A  Veena Sturgess 04/16/2017, 12:51 PM

## 2017-04-16 NOTE — Plan of Care (Signed)
  Progressing RH SKIN INTEGRITY RH STG MAINTAIN SKIN INTEGRITY WITH ASSISTANCE Description STG Maintain Skin Integrity With Mod. Assistance.  04/16/2017 1440 - Progressing by Glean Salen, RN Flowsheets Taken 04/16/2017 1440  STG: Maintain skin integrity with assistance 6-Modified independent RH SAFETY RH STG ADHERE TO SAFETY PRECAUTIONS W/ASSISTANCE/DEVICE Description STG Adhere to Safety Precautions With Mod.Assistance/Device.  04/16/2017 1440 - Progressing by Glean Salen, RN Flowsheets Taken 04/16/2017 1440  STG:Pt will adhere to safety precautions with assistance/device 2-Maximum assistance RH STG DECREASED RISK OF FALL WITH ASSISTANCE Description STG Decreased Risk of Fall With Mod.Assistance.  04/16/2017 1440 - Progressing by Glean Salen, RN Flowsheets Taken 04/16/2017 1440  TWS:[id number] risk of fall  with assistance/device 2-Maximum assistance RH PAIN MANAGEMENT RH STG PAIN MANAGED AT OR BELOW PT'S PAIN GOAL Description Less than 3,on 1 to 10 scale  04/16/2017 1440 - Progressing by Glean Salen, RN RH KNOWLEDGE DEFICIT RH STG INCREASE KNOWLEDGE OF HYPERTENSION Description Family will be able to verbalized the provided material about hypertension  04/16/2017 1440 - Progressing by Glean Salen, RN RH STG INCREASE KNOWLEDGE OF DYSPHAGIA/FLUID INTAKE Description Family will be able to verbalized the protocol for dysphagia diet and the precautions with min assist/cues  04/16/2017 1440 - Progressing by Glean Salen, RN

## 2017-04-16 NOTE — Progress Notes (Signed)
[place] PHYSICAL MEDICINE & REHABILITATION     PROGRESS NOTE  Subjective/Complaints:  Patient seen lying in bed this morning. She states she slept well overnight. Daughter at bedside. She has questions regarding stroke.  ROS: Denies CP, SOB, N/V/D.  Objective: Vital Signs: Blood pressure (!) 157/76, pulse 74, temperature 97.6 F (36.4 C), temperature source Oral, resp. rate 19, weight 75.7 kg (166 lb 14.2 oz), SpO2 96 %. No results found. No results for input(s): WBC, HGB, HCT, PLT in the last 72 hours. No results for input(s): NA, K, CL, GLUCOSE, BUN, CREATININE, CALCIUM in the last 72 hours.  Invalid input(s): CO CBG (last 3)  No results for input(s): GLUCAP in the last 72 hours.  Wt Readings from Last 3 Encounters:  04/11/17 75.7 kg (166 lb 14.2 oz)  04/06/17 74.5 kg (164 lb 3.9 oz)  02/28/17 79.4 kg (175 lb)    Physical Exam:  BP (!) 157/76 (BP Location: Left Arm)   Pulse 74   Temp 97.6 F (36.4 C) (Oral)   Resp 19   Wt 75.7 kg (166 lb 14.2 oz)   SpO2 96%   BMI 31.53 kg/m  Constitutional: She appears well-developed and well-nourished.  HENT: Normocephalic and atraumatic.  Eyes: No discharge.   Cardiovascular: RRR. No JVD. Respiratory: Effort  Normal and breath sounds normal. GI: Bowel sounds are normal. Nondistended.  Musculoskeletal: No edema or tenderness in extremities  Neurological:  Alert Nonverbal Motor: LUE /5 proximal to distal LLE: 4-/5 hip flexion, 4/5 knee extension, 4+/5 ADF RUE/RLE: 0/5 proximal to distal  (unchanged) Skin: Skin is warm and dry.  Psychiatric: Flat.  Assessment/Plan: 1. 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 Physiatrist is providing close team supervision and 24 hour management of active medical problems listed below. Physiatrist and rehab team continue to assess barriers to  discharge/monitor patient progress toward functional and medical goals.  Function:  Bathing Bathing position   Position: Bed  Bathing parts Body parts bathed by patient: Chest, Abdomen, Right arm Body parts bathed by helper: Front perineal area, Buttocks, Right upper leg, Left upper leg, Right lower leg, Left lower leg, Back, Left arm  Bathing assist Assist Level: (MAX A)      Upper Body Dressing/Undressing Upper body dressing   What is the patient wearing?: Pull over shirt/dress     Pull over shirt/dress - Perfomed by patient: Thread/unthread left sleeve, Put head through opening Pull over shirt/dress - Perfomed by helper: Thread/unthread right sleeve, Pull shirt over trunk        Upper body assist Assist Level: (Total assist)      Lower Body Dressing/Undressing Lower body dressing   What is the patient wearing?: Pants       Pants- Performed by helper: Thread/unthread right pants leg, Thread/unthread left pants leg, Pull pants up/down   Non-skid slipper socks- Performed by helper: Don/doff right sock, Don/doff left sock                  Lower body assist Assist for lower body dressing: (Total assist)      Toileting Toileting Toileting activity did not occur: No continent bowel/bladder event        Toileting assist     Transfers Chair/bed transfer   Chair/bed transfer method: Stand pivot Chair/bed transfer assist level: Maximal assist (Pt 25 - 49%/lift and lower) Chair/bed transfer assistive device: Mechanical lift  Mechanical lift: Architectural technologist activity did not occur: Safety/medical concerns   Max distance: 10 Assist level: Maximal assist (Pt 25 - 49%)   Wheelchair Wheelchair activity did not occur: Safety/medical concerns Type: Manual      Cognition Comprehension Comprehension assist level: Understands basic 50 - 74% of the time/ requires cueing 25 - 49% of the time  Expression Expression assist level: Expresses basis  less than 25% of the time/requires cueing >75% of the time.  Social Interaction Social Interaction assist level: Interacts appropriately 25 - 49% of time - Needs frequent redirection.  Problem Solving Problem solving assist level: Solves basic less than 25% of the time - needs direction nearly all the time or does not effectively solve problems and may need a restraint for safety  Memory Memory assist level: Recognizes or recalls 25 - 49% of the time/requires cueing 50 - 75% of the time    Medical Problem List and Plan: 1.  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  DVT Prophylaxis/Anticoagulation: SCDs. Monitor for any signs of DVT 3. Pain Management: Tylenol as needed 4. Mood: Effexor 75 mg daily, Xanax 0.5 mg 3 times a day as needed   Amantadine 100 mg with breakfast and lunch started on 3/9 5. Neuropsych: This patient is not capable of making decisions on his own behalf. 6. Skin/Wound Care: Routine skin checks 7. Fluids/Electrolytes/Nutrition: Routine I&O's   BMP within acceptable range on 3/7 8. Hypertension.    HCTZ 12.5 mg daily, changed to hygrotin 25 mg on 3/8, increased to 50 mg on 3/11   Avapro 300 mg daily.    Monitor with increased mobility   Remains elevated on 3/11 9. Dysphagia. Dysphagia #1 nectar liquids. Follow-up speech therapy 10. Prediabetes   Continue to monitor 11. Transaminitis   Continue to monitor   Labs pending 12. Leukocytosis   WBCs 12.4 on 3/7   UA equivocal, Ucx no growth   Labs pending  LOS (Days) 5 A FACE TO FACE EVALUATION WAS PERFORMED  Ankit [name] 04/16/2017 8:21 AM

## 2017-04-16 NOTE — Progress Notes (Signed)
Occupational Therapy Session Note  Patient Details  Name: Ebony Williams MRN: [id number] Date of Birth: 02/22/1950  Today's Date: 04/16/2017 OT Individual Time: 0800-0900 OT Individual Time Calculation (min): 60 min    Short Term Goals: Week 1:  OT Short Term Goal 1 (Week 1): Pt will complete bathing with mod assist and mod cues for sequencing OT Short Term Goal 2 (Week 1): Pt will complete UB dressing with mod assist OT Short Term Goal 3 (Week 1): Pt will complete toilet transfer with max assist of 1 caregiver OT Short Term Goal 4 (Week 1): Pt will complete LB dressing with max assist of one caregiver  Skilled Therapeutic Interventions/Progress Updates:    Pt completed bathing and dressing supine to sit during session.  She had already had peri area washed earlier and new brief donned from nursing, per pt's daughter's report.  Did wash front peri area in supine with max assist however, secondary to this information not given to therapist until he was in the process of completing the task.  Therapist donned shorts over feet with total assist and then worked on rolling side to side to pull them up over her hips.  Max assist with max demonstrational cueing for rolling to the right side.  Min assist for rolling to the right.  Total assist for pulling shorts over hips.  Increased tone noted in the right elbow extensors, internal rotators, and shoulder extensors throughout session.  She was able to transition to sitting with mod assist to the left side.  Once sitting she needed mod assist for initial sitting balance with progression to supervision statically.  Had pt work on bathing task with use of the LUE.  Dynamic sitting balance min to mod assist while completing UB bathing and washing her legs.  Max instructional cueing for scanning right of midline to locate towel, washcloth, deodorant, etc needed for selfcare tasks.  Max hand over hand for integration of the RUE for application of deodorant.  She  was able to donn socks with mod assist, crossing them over the opposite knee.  Max assist for crossing the RLE over the left knee.  She donned shoes over feet with max assist on the right and supervision on the left.  Therapist assisted with tying them however.  Finished session with stand pivot transfer to the wheelchair with max assist.  Pt left with daughter and friend in room and safety belt in place.  Half lap tray in place for supporting the RUE as well.    Therapy Documentation Precautions:  Precautions Precautions: Fall Precaution Comments: right hemiplegia and neglect, extensor tone in the left elbow and in the shoulder Restrictions Weight Bearing Restrictions: No  Pain: Pain Assessment Pain Assessment: No/denies pain Pain Score: 0-No pain ADL: See Function Navigator for Current Functional Status.   Therapy/Group: Individual Therapy  Sisto Granillo OTR/L 04/16/2017, 12:15 PM

## 2017-04-16 NOTE — Progress Notes (Signed)
Patient became very lethargic after her OT session,vitals signs are stable,  Dan A,PA was informed,CT scan and xray were ordered.

## 2017-04-16 NOTE — Progress Notes (Signed)
Pt refusing CPAP for the night. RT will continue to monitor as needed.  

## 2017-04-16 NOTE — Progress Notes (Signed)
Patient refuses CPAP for tonight.  

## 2017-04-17 ENCOUNTER — Inpatient Hospital Stay (HOSPITAL_COMMUNITY): Payer: Medicare Other | Admitting: Physical Therapy

## 2017-04-17 ENCOUNTER — Inpatient Hospital Stay (HOSPITAL_COMMUNITY): Payer: Medicare Other | Admitting: Occupational Therapy

## 2017-04-17 ENCOUNTER — Inpatient Hospital Stay (HOSPITAL_COMMUNITY): Payer: Medicare Other

## 2017-04-17 DIAGNOSIS — R799 Abnormal finding of blood chemistry, unspecified: Secondary | ICD-10-CM | POA: Insufficient documentation

## 2017-04-17 NOTE — Progress Notes (Signed)
Speech Language Pathology Daily Make-up Session Note  Patient Details  Name: Ebony Williams MRN: [id number] Date of Birth: [birth year]  Today's Date: 04/17/2017 SLP Individual Time: 1030-1100 SLP Individual Time Calculation (min): 30 min  Short Term Goals: Week 1: SLP Short Term Goal 1 (Week 1): Pt will verbally convey her needs and wants to caregivers at the word level in 50% of opportunities with mod assist verbal cues. SLP Short Term Goal 2 (Week 1): Pt will name basic, familiar objects for 75% accuracy with mod assist verbal cues for word finding.  SLP Short Term Goal 3 (Week 1): Pt will locate items to the right of midline during basic, familiar tasks in 50% of opportunities with mod assist verbal cues.  SLP Short Term Goal 4 (Week 1): Pt will sustain her attention to basic, familiar tasks for 3 minute intervals with mod assist verbal cues for redirection.   SLP Short Term Goal 5 (Week 1): Pt will consume dys 1 textures and nectar thick liquids with min assist verbal cues for use of swallowing precautions.  SLP Short Term Goal 6 (Week 1): Pt will consume therapeutic trials of thin liquids with min assist verbal cues for use of swallowing precautions.    Skilled Therapeutic Interventions: Skilled treatment session focused on communication goals. SLP facilitated by providing Min A sentence completion cues for patient to name functional items with 100% accuracy. Patient also repeated 4-6 word sentence about the function of the item with 100% accuracy and Max A verbal cues needed for use of an increased vocal intensity. Patient also spontaneously verbalized functional question X 1.  SLP also utilized a simple communication board with 4 functional pictures to convey wants/needs with 75% accuracy and extra time when placed in her left field of vision. Will continue to practice utilization for effective communication with staff. Patient left upright in wheelchair with quick release belt in place and  all needs within reach. Continue with current plan of care.      Function:   Cognition Comprehension Comprehension assist level: Understands basic 50 - 74% of the time/ requires cueing 25 - 49% of the time  Expression   Expression assist level: Expresses basic 25 - 49% of the time/requires cueing 50 - 75% of the time. Uses single words/gestures.  Social Interaction Social Interaction assist level: Interacts appropriately 25 - 49% of time - Needs frequent redirection.  Problem Solving Problem solving assist level: Solves basic 25 - 49% of the time - needs direction more than half the time to initiate, plan or complete simple activities  Memory Memory assist level: Recognizes or recalls less than 25% of the time/requires cueing greater than 75% of the time    Pain No/Denies Pain   Therapy/Group: Individual Therapy  Jamelle Goldston 04/17/2017, 12:49 PM

## 2017-04-17 NOTE — Progress Notes (Signed)
Occupational Therapy Session Note  Patient Details  Name: Ebony Williams MRN: [id number] Date of Birth: February 20, 1950  Today's Date: 04/17/2017 OT Individual Time: 2751-7001 OT Individual Time Calculation (min): 60 min    Short Term Goals: Week 1:  OT Short Term Goal 1 (Week 1): Pt will complete bathing with mod assist and mod cues for sequencing OT Short Term Goal 2 (Week 1): Pt will complete UB dressing with mod assist OT Short Term Goal 3 (Week 1): Pt will complete toilet transfer with max assist of 1 caregiver OT Short Term Goal 4 (Week 1): Pt will complete LB dressing with max assist of one caregiver  Skilled Therapeutic Interventions/Progress Updates:    Treatment session with focus on self-care retraining and visual scanning to Rt.  Pt received in bed with eyes open upon arrival.  Pt maintained eyes open 30-40% of session.  Engaged in [place] bathing/dressing at bed level with pt demonstrating improved sequencing and rolling with min-mod assist to allow therapist to complete hygiene as pt incontinent of bowel.  Total assist when donning pants as pt continues to have no active movement in RLE.  Bed mobility with mod assist sidelying to sitting.  Therapist maintained contact guard throughout sitting during UB bathing.  Incorporated visual scanning to Rt to locate bathing items with pt requiring increased time to locate items.  Increased tone noted in RUE in sitting at EOB with attempts to position to decrease tone.  Donned Lt shoe in figure 4 position with min assist for sitting balance, therapist donning Rt shoe and fastening both.  Engaged in sit > stand in [place] with pt able to pull up into standing with only Min/steady assist.  Engaged in grooming tasks seated at sink with setup and verbal cues to locate items in Rt visual field.  Pt left seated upright in w/c with half lap tray for positioning of RUE and quick release belt for safety.  Pt with increased communication and arousal this session.   Pt communicating incontinent of bowel by shaking head "no" when asked if she was clean.  Pt softly stating "pants" when choosing clothing items and stating "no" when asked if RUE hurt.    Therapy Documentation Precautions:  Precautions Precautions: Fall Precaution Comments: right hemiplegia and neglect, extensor tone in the left elbow and in the shoulder Restrictions Weight Bearing Restrictions: No General:   Vital Signs: Therapy Vitals Pulse Rate: 96 BP: (!) 147/83 Patient Position (if appropriate): Sitting Pain:  Pt with no c/o pain  See Function Navigator for Current Functional Status.   Therapy/Group: Individual Therapy  Simonne Come 04/17/2017, 9:52 AM

## 2017-04-17 NOTE — Progress Notes (Signed)
Speech Language Pathology Daily Session Note  Patient Details  Name: Ebony Williams MRN: [id number] Date of Birth: July 19, 1950  Today's Date: 04/17/2017 SLP Individual Time: 2633-3545 SLP Individual Time Calculation (min): 55 min  Short Term Goals: Week 1: SLP Short Term Goal 1 (Week 1): Pt will verbally convey her needs and wants to caregivers at the word level in 50% of opportunities with mod assist verbal cues. SLP Short Term Goal 2 (Week 1): Pt will name basic, familiar objects for 75% accuracy with mod assist verbal cues for word finding.  SLP Short Term Goal 3 (Week 1): Pt will locate items to the right of midline during basic, familiar tasks in 50% of opportunities with mod assist verbal cues.  SLP Short Term Goal 4 (Week 1): Pt will sustain her attention to basic, familiar tasks for 3 minute intervals with mod assist verbal cues for redirection.   SLP Short Term Goal 5 (Week 1): Pt will consume dys 1 textures and nectar thick liquids with min assist verbal cues for use of swallowing precautions.  SLP Short Term Goal 6 (Week 1): Pt will consume therapeutic trials of thin liquids with min assist verbal cues for use of swallowing precautions.    Skilled Therapeutic Interventions:Skilled ST services focused on cognitive and swallow skills. SLP facilitated oral care set up with suction toothbrush,pt demonstrated oral care with min A verbal cues. SLP facilitated trials of ice chips x 10, no overt aspiration noted with clear vocal quality.Pt demonstrated moderate oral holding 3-5 seconds with swallow initiation cues. Pt consumed pureed textured foods, magic cup, 30 minutes following thin liquid trials, demonstrating 5 second oral holding with no overt s/s aspiration.Pt demonstrated overall reduce laryngeal elevation and excurison with swallow initiation. SLP facilitated use of 4 picture communication board, pt demonstrated ability to receptively identify objects when named with 100% accuracy,  however given more abstracting questions in basic scenarios demonstrated 75% accuracy. Pt communicates at word level and response to basic yes/no questions with 100% accuracy pertaining to immediate environment, however given complex yes/no question demonstrated 25% accuracy given Max A verbal cues. Pt was left in room with call bell within reach. Reccomend to continue skilled ST services.      Function:  Eating Eating   Modified Consistency Diet: Yes Eating Assist Level: Set up assist for;Supervision or verbal cues;Help managing cup/glass   Eating Set Up Assist For: Opening containers       Cognition Comprehension Comprehension assist level: Understands basic 50 - 74% of the time/ requires cueing 25 - 49% of the time  Expression   Expression assist level: Expresses basic 25 - 49% of the time/requires cueing 50 - 75% of the time. Uses single words/gestures.  Social Interaction Social Interaction assist level: Interacts appropriately 25 - 49% of time - Needs frequent redirection.  Problem Solving Problem solving assist level: Solves basic 25 - 49% of the time - needs direction more than half the time to initiate, plan or complete simple activities  Memory Memory assist level: Recognizes or recalls less than 25% of the time/requires cueing greater than 75% of the time    Pain Pain Assessment Pain Assessment: No/denies pain  Therapy/Group: Individual Therapy  Eric Morganti  [facility] 04/17/2017, 3:26 PM

## 2017-04-17 NOTE — Progress Notes (Signed)
Contacted Bill with RRT for pt bedtime order for CPAP and was informed pt has been refusing CPAP; will continue to monitor

## 2017-04-17 NOTE — Progress Notes (Signed)
Physical Therapy Session Note  Patient Details  Name: Ebony Williams MRN: [id number] Date of Birth: 01/16/1951  Today's Date: 04/17/2017 PT Individual Time: 1130-1200 PT Individual Time Calculation (min): 30 min   Short Term Goals: Week 1:  PT Short Term Goal 1 (Week 1): Pt will maintain sitting balance EOB with CGA PT Short Term Goal 2 (Week 1): Pt will perform squat pivot transfer Mod A consistently PT Short Term Goal 3 (Week 1): Pt will initiate w/c propulsion training  Skilled Therapeutic Interventions/Progress Updates:    PT greeted sitting upright in manual W/C in room, husband in room but not present throughout session, agreeable to therapy, pt lethargic but easily aroused, very soft-spoken. Pt attempts to propel manual W/C using hemi-technique however too lethargic to complete, dependently transported to therapy hallway. Pt performs sit<>stand from manual W/C using hallway railing on R, therapist under [place] with [place]. In standing, pt hyperextends RLE and bears weight through it abducted for stability. Pt performs forward/backward steps with LLE, R knee blocked in fromt and behind with therapist knees. Pt fatigues and requires seated rest breaks between trials. Pt able to verbalize that brief was falling down, provides echoed responses to questioning of pain location and impulsively sits in manual W/C when made aware that she was starting to sit on pt knee. At end of session, pt returned to room sitting upright in manual W/C, husband present, pillow placed under RUE for optimal positioning,denies any needs at this time.  Therapy Documentation Precautions:  Precautions Precautions: Fall Precaution Comments: right hemiplegia and neglect, extensor tone in the left elbow and in the shoulder Restrictions Weight Bearing Restrictions: No   See Function Navigator for Current Functional Status.   Therapy/Group: Individual Therapy  Salvatore Decent 04/17/2017, 2:22 PM

## 2017-04-17 NOTE — Progress Notes (Signed)
Physical Therapy Note  Patient Details  Name: MERLIN EGE MRN: [id number] Date of Birth: 1950-10-15 Today's Date: 04/17/2017    Time: [phone] 60 minutes  1:1 No c/o pain.  Pt performs squat pivot transfers during session with max A to both sides.  Standing tolerance and wt shifts with focus on Rt LE wt bearing with manual facilitation at trunk for postural control and at Rt knee to prevent hyperextension.  Pt able to tolerate standing 2 minutes at a time.  Seated balance with focus on turning head and eyes to place bean bags in a box, pt improves with multimodal cuing.  Pt then with episode of bowel incontinence.  Sit to stand in stedy with min A multiple reps with total A for hygiene and clothing management. Pt left in room with needs at hand, daughter present, quick release belt donned.   Drianna Chandran 04/17/2017, 10:31 AM

## 2017-04-17 NOTE — Progress Notes (Signed)
[facility] PHYSICAL MEDICINE & REHABILITATION     PROGRESS NOTE  Subjective/Complaints:  Pt seen sitting up in bed this AM.  She states she slept well overnight.  Pt noted to be lethargic yesterday, CT ordered, discussed with PA.  Pt states she was really tired yesterday.   ROS: Denies CP, SOB, N/V/D.  Objective: Vital Signs: Blood pressure (!) 151/70, pulse 95, temperature 98.2 F (36.8 C), temperature source Axillary, resp. rate 15, weight 75.7 kg (166 lb 14.2 oz), SpO2 95 %. Dg Chest 2 View  Result Date: 04/16/2017 CLINICAL DATA:  Patient admitted for right side stroke and now having severe lethargy today. No other complaints. EXAM: CHEST - 2 VIEW COMPARISON:  Chest x-ray dated 04/07/2017 and chest x-ray dated 06/01/2003. FINDINGS: Stable mild cardiomegaly. Overall cardiomediastinal silhouette is stable. Mild patchy bibasilar opacities, likely atelectasis or edema. Probable small left pleural effusion. No acute or suspicious osseous finding. IMPRESSION: 1. Mild patchy bibasilar opacities, most likely atelectasis or edema, perhaps slightly improved aeration at the left lung base compared to previous study of 04/07/2017. 2. Probable small left pleural effusion. 3. Stable mild cardiomegaly. Electronically Signed   By: Franki Cabot M.D.   On: 04/16/2017 11:23   Ct Head Wo Contrast  Result Date: 04/16/2017 CLINICAL DATA:  Onset of lethargy this morning in patient with acute left MCA and PCA infarcts by brain MRI 04/07/2017. EXAM: CT HEAD WITHOUT CONTRAST TECHNIQUE: Contiguous axial images were obtained from the base of the skull through the vertex without intravenous contrast. COMPARISON:  Head CT scan and brain MRI 04/07/2017. FINDINGS: Brain: There has been expected evolutionary change in the patient's left MCA and left MCA/PCA border zone infarcts. No hemorrhage is identified. No acute infarct, midline shift or abnormal extra-axial fluid collection. Chronic microvascular ischemic change is again  seen. Vascular: Atherosclerosis is noted. Skull: Intact. Sinuses/Orbits: Mucosal thickening in the left maxillary sinus has improved but mucosal thickening in the left frontal sinus appears slightly worse than on the most recent CT scan. There is some scattered ethmoid air cell disease. Minimal mucosal thickening right maxillary sinus noted. Other: None. IMPRESSION: No acute abnormality. The patient's left-sided infarct demonstrates expected evolutionary change. Chronic microvascular ischemic disease. Atherosclerosis. Left maxillary and left frontal sinus disease. Electronically Signed   By: Inge Rise M.D.   On: 04/16/2017 11:35   Recent Labs    04/16/17 1026  WBC 13.1*  HGB 16.8*  HCT 49.4*  PLT 256   Recent Labs    04/16/17 1026  NA 136  K 3.7  CL 100*  GLUCOSE 204*  BUN 22*  CREATININE 0.74  CALCIUM 9.2   CBG (last 3)  Recent Labs    04/16/17 1019  GLUCAP 200*    Wt Readings from Last 3 Encounters:  04/11/17 75.7 kg (166 lb 14.2 oz)  04/06/17 74.5 kg (164 lb 3.9 oz)  02/28/17 79.4 kg (175 lb)    Physical Exam:  BP (!) 151/70 (BP Location: Left Arm)   Pulse 95   Temp 98.2 F (36.8 C) (Axillary)   Resp 15   Wt 75.7 kg (166 lb 14.2 oz)   SpO2 95%   BMI 31.53 kg/m  Constitutional: She appears well-developed and well-nourished.  HENT: Normocephalic and atraumatic.  Eyes: No discharge.   Cardiovascular: RRR. No JVD. Respiratory: Effort normal and breath sounds normal. GI: Bowel sounds are normal. Nondistended.  Musculoskeletal: No edema or tenderness in extremities  Neurological:  Alert Dysphonia Motor: LUE 4/5 proximal to distal  LLE: 4/5 hip flexion, 4+/5 knee extension, 4+/5 ADF RUE/RLE: 0/5 proximal to distal  (unchanged) Skin: Skin is warm and dry.  Psychiatric: Flat.  Assessment/Plan: 1. 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 Physiatrist is providing close team supervision and 24 hour management of active medical problems listed below. Physiatrist and rehab team continue to assess barriers to discharge/monitor patient progress toward functional and medical goals.  Function:  Bathing Bathing position   Position: Sitting EOB  Bathing parts Body parts bathed by patient: Right arm, Chest, Abdomen, Right upper leg, Left upper leg, Left lower leg Body parts bathed by helper: Back, Front perineal area, Buttocks, Left arm, Right lower leg  Bathing assist Assist Level: (MAX A)      Upper Body Dressing/Undressing Upper body dressing   What is the patient wearing?: Pull over shirt/dress     Pull over shirt/dress - Perfomed by patient: Thread/unthread left sleeve, Put head through opening Pull over shirt/dress - Perfomed by helper: Pull shirt over trunk, Thread/unthread right sleeve        Upper body assist Assist Level: (Total assist)      Lower Body Dressing/Undressing Lower body dressing   What is the patient wearing?: Pants, Socks, Shoes       Pants- Performed by helper: Thread/unthread right pants leg, Thread/unthread left pants leg, Pull pants up/down   Non-skid slipper socks- Performed by helper: Don/doff right sock, Don/doff left sock   Socks - Performed by helper: Don/doff right sock, Don/doff left sock Shoes - Performed by patient: Don/doff right shoe, Don/doff left shoe Shoes - Performed by helper: Fasten right, Fasten left          Lower body assist Assist for lower body dressing: (Total assist)      Toileting Toileting Toileting activity did not occur: No continent bowel/bladder event        Toileting assist     Transfers Chair/bed transfer   Chair/bed transfer method: Stand pivot Chair/bed transfer assist level: Maximal assist (Pt 25 - 49%/lift and lower) Chair/bed transfer assistive device: Mechanical lift Mechanical lift: Stedy    Locomotion Ambulation Ambulation activity did not occur: Safety/medical concerns   Max distance: 10 Assist level: Maximal assist (Pt 25 - 49%)   Wheelchair Wheelchair activity did not occur: Safety/medical concerns Type: Manual      Cognition Comprehension Comprehension assist level: Understands basic 50 - 74% of the time/ requires cueing 25 - 49% of the time  Expression Expression assist level: Expresses basis less than 25% of the time/requires cueing >75% of the time.  Social Interaction Social Interaction assist level: Interacts appropriately 25 - 49% of time - Needs frequent redirection.  Problem Solving Problem solving assist level: Solves basic 25 - 49% of the time - needs direction more than half the time to initiate, plan or complete simple activities  Memory Memory assist level: Recognizes or recalls less than 25% of the time/requires cueing greater than 75% of the time    Medical Problem List and Plan: 1.  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  DVT Prophylaxis/Anticoagulation: SCDs. Monitor for any signs of DVT 3. Pain Management: Tylenol as needed 4. Mood: Effexor 75 mg daily, Xanax 0.5 mg 3 times a day as  needed   Amantadine 100 mg with breakfast and lunch started on 3/9 5. Neuropsych: This patient is not capable of making decisions on his own behalf. 6. Skin/Wound Care: Routine skin checks 7. Fluids/Electrolytes/Nutrition: Routine I&O's   BUN/Cr trending up, encourage fluids 8. Hypertension.    HCTZ 12.5 mg daily, changed to hygrotin 25 mg on 3/8, increased to 50 mg on 3/11   Avapro 300 mg daily.    Monitor with increased mobility   Remains elevated on 3/12 9. Dysphagia. Dysphagia #1 nectar liquids. Follow-up speech therapy 10. Prediabetes   Continue to monitor   Elevated on 3/11, cont to monitor 11. Transaminitis   Continue to  monitor   Elevated, but stable on 3/11   Cont to monitor, will consider decreasing statin 12. Leukocytosis   WBCs 13.1 on 3/11   UA equivocal, Ucx no growth   CXR reviewed, stable  LOS (Days) 6 A FACE TO FACE EVALUATION WAS PERFORMED  Ankit [name] 04/17/2017 9:19 AM

## 2017-04-18 ENCOUNTER — Inpatient Hospital Stay (HOSPITAL_COMMUNITY): Payer: Medicare Other | Admitting: Physical Therapy

## 2017-04-18 ENCOUNTER — Inpatient Hospital Stay (HOSPITAL_COMMUNITY): Payer: Medicare Other | Admitting: Occupational Therapy

## 2017-04-18 ENCOUNTER — Inpatient Hospital Stay (HOSPITAL_COMMUNITY): Payer: Medicare Other | Admitting: Speech Pathology

## 2017-04-18 ENCOUNTER — Inpatient Hospital Stay (HOSPITAL_COMMUNITY): Payer: Medicare Other

## 2017-04-18 NOTE — Progress Notes (Signed)
Speech Language Pathology Daily Session Note  Patient Details  Name: Ebony Williams MRN: [id number] Date of Birth: January 04, 1951  Today's Date: 04/18/2017 SLP Individual Time: 1455-1525 SLP Individual Time Calculation (min): 30 min  Short Term Goals: Week 1: SLP Short Term Goal 1 (Week 1): Pt will verbally convey her needs and wants to caregivers at the word level in 50% of opportunities with mod assist verbal cues. SLP Short Term Goal 2 (Week 1): Pt will name basic, familiar objects for 75% accuracy with mod assist verbal cues for word finding.  SLP Short Term Goal 3 (Week 1): Pt will locate items to the right of midline during basic, familiar tasks in 50% of opportunities with mod assist verbal cues.  SLP Short Term Goal 4 (Week 1): Pt will sustain her attention to basic, familiar tasks for 3 minute intervals with mod assist verbal cues for redirection.   SLP Short Term Goal 5 (Week 1): Pt will consume dys 1 textures and nectar thick liquids with min assist verbal cues for use of swallowing precautions.  SLP Short Term Goal 6 (Week 1): Pt will consume therapeutic trials of thin liquids with min assist verbal cues for use of swallowing precautions.    Skilled Therapeutic Interventions:  Pt was seen for skilled ST targeting communication goals.  Pt was asleep upon arrival but awakened easily to voice and light touch.  Pt could name basic objects from the [facility] box with overall mod assist semantic cues for word finding and mod-max assist verbal cues to increase vocal intensity.  Pt needed max assist to match object to word from a field of two.  Pt's husband was present and had questions regarding techniques to maximize pt's functional communication.  Discussed allowing pt extra time for communication, providing pt with close ended question/choice of two rather than open ended questions, providing semantic cues, etc.  All questions were answered to pt's and husband's satisfaction at this time.  Pt left  resting in bed with bed alarm set.  Continue per current plan of care.       Function:  Eating Eating               Cognition Comprehension Comprehension assist level: Understands basic 75 - 89% of the time/ requires cueing 10 - 24% of the time  Expression   Expression assist level: Expresses basic 50 - 74% of the time/requires cueing 25 - 49% of the time. Needs to repeat parts of sentences.  Social Interaction Social Interaction assist level: Interacts appropriately 25 - 49% of time - Needs frequent redirection.  Problem Solving Problem solving assist level: Solves basic 50 - 74% of the time/requires cueing 25 - 49% of the time  Memory Memory assist level: Recognizes or recalls 25 - 49% of the time/requires cueing 50 - 75% of the time    Pain Pain Assessment Pain Assessment: No/denies pain  Therapy/Group: Individual Therapy  Nashalie Sallis, Selinda Orion 04/18/2017, 3:49 PM

## 2017-04-18 NOTE — Plan of Care (Signed)
  RH BOWEL ELIMINATION RH STG MANAGE BOWEL WITH ASSISTANCE Description STG Manage Bowel with Mod. Assistance.  04/18/2017 2337 - Progressing by Lysbeth Penner D, RN  Incont episodes assist with bathroom needs and bathing.  RH BLADDER ELIMINATION RH STG MANAGE BLADDER WITH ASSISTANCE Description STG Manage Bladder With Mod. Assistance  04/18/2017 2337 - Progressing by Lysbeth Penner D, RN  Incont episodes assist with bathroom needs and bathing.

## 2017-04-18 NOTE — Progress Notes (Signed)
Speech Language Pathology Daily Session Note  Patient Details  Name: Ebony Williams MRN: [id number] Date of Birth: April 10, 1950  Today's Date: 04/18/2017 SLP Individual Time: 1100-1200 SLP Individual Time Calculation (min): 60 min  Short Term Goals: Week 1: SLP Short Term Goal 1 (Week 1): Pt will verbally convey her needs and wants to caregivers at the word level in 50% of opportunities with mod assist verbal cues. SLP Short Term Goal 2 (Week 1): Pt will name basic, familiar objects for 75% accuracy with mod assist verbal cues for word finding.  SLP Short Term Goal 3 (Week 1): Pt will locate items to the right of midline during basic, familiar tasks in 50% of opportunities with mod assist verbal cues.  SLP Short Term Goal 4 (Week 1): Pt will sustain her attention to basic, familiar tasks for 3 minute intervals with mod assist verbal cues for redirection.   SLP Short Term Goal 5 (Week 1): Pt will consume dys 1 textures and nectar thick liquids with min assist verbal cues for use of swallowing precautions.  SLP Short Term Goal 6 (Week 1): Pt will consume therapeutic trials of thin liquids with min assist verbal cues for use of swallowing precautions.    Skilled Therapeutic Interventions:Skilled ST services focused on swallow and speech skills. SLP facilitated oral car with set up of suction toothbrush and required mod A to provide through cleaning. SLP facilitated trials of ice chips x 10 demonstrated 2 mild delayed throat clear, tsp thin x10 1 immediate cough and controled cup sips x10 3 immediate coughs, suggesting oral control impairment with large bolus size. Pt demonstrated clear vocal quality following s/s of aspiration and swallow initiation without verbal cues 1-3 seconds. SLP facilitated expanding expressive utterances creating sentence with max A cues and naming actions in picture card task with 75% accuracy with max A verbal cues. SLP attempted basic 3 card sequence, however was unsuccessful.  SLP communicated with family about pt progress, per pt request . Pt was left in room with call bell within reach. Reccomend to continue skilled ST services.       Function:  Eating Eating   Modified Consistency Diet: No(thin trials with ST) Eating Assist Level: Set up assist for;Supervision or verbal cues           Cognition Comprehension Comprehension assist level: Understands basic 50 - 74% of the time/ requires cueing 25 - 49% of the time  Expression   Expression assist level: Expresses basic 25 - 49% of the time/requires cueing 50 - 75% of the time. Uses single words/gestures.  Social Interaction Social Interaction assist level: Interacts appropriately 25 - 49% of time - Needs frequent redirection.  Problem Solving Problem solving assist level: Solves basic 50 - 74% of the time/requires cueing 25 - 49% of the time  Memory Memory assist level: Recognizes or recalls less than 25% of the time/requires cueing greater than 75% of the time    Pain Pain Assessment Pain Assessment: No/denies pain Pain Score: 0-No pain  Therapy/Group: Individual Therapy  Enda Santo  [facility] 04/18/2017, 1:09 PM

## 2017-04-18 NOTE — Progress Notes (Signed)
Physical Therapy Session Note  Patient Details  Name: Ebony Williams MRN: [id number] Date of Birth: 07/29/1950  Today's Date: 04/18/2017 PT Individual Time: 1005-1030 AND 1602-1700 PT Individual Time Calculation (min): 25 min AND 58 min   Short Term Goals: Week 1:  PT Short Term Goal 1 (Week 1): Pt will maintain sitting balance EOB with CGA PT Short Term Goal 2 (Week 1): Pt will perform squat pivot transfer Mod A consistently PT Short Term Goal 3 (Week 1): Pt will initiate w/c propulsion training  Skilled Therapeutic Interventions/Progress Updates:  Session 1   Pt received supine in bed and agreeable to PT. Supine>sit transfer with mod assist and moderate cues from PT.   Scoot to EOB with min-mod assist from PT and moderate cues for imrpoved weight shifting.   Sit<>stand at EOB with mod assist and UE support HW. Pt noted to have significant GR or no heel contact in standing.  Pt instructed in standing on red wedge 3 x 2 minutes to force DF in the RLE. Mod-max assist from PT with facilitation of knee flexion to prevent GR.   Seated balance with R lateral lean to force WB through the RUE. 2 x 2 minutes. Min assist to maintain RUE placement and facilitate elbow flexion to reduce tone.   Additional DF stretch 3 x 1 min in the RLE with emphasis on soleus while sitting EOB.    Sit>supine completed with min assist, and left supine in bed with call bell in reach and all needs met.     Session 2   Pt received supine in bed and agreeable to PT. Supine>sit transfer with mod assist and moderate cues for technique.   Squat pivot transfer to the R with max assist max cues for sequencing and improved pelvic disassociation to improve transfer to the R   Pt transported to rehab gym. Standing tolerance in parallel bars with 1-2 UE support. PT required to facilitate R side anterior pelvic rotation and prevent knee GR. Pt able to tolerate standing up to 4 minutes x 4 bouts.   Squat pivot transfer  to the L with mod assist to nustep. Max cues for set up and sequencing from PT. Nustep reciprocal movement training x4 min +5 min; PT provided min assist intermittently to reposition LLE and facilitate hip ER and ankle eversion to overcome tone.   PT instructed pt in WC mobility x 163f with min-mod assist using L hemi technique of L LLE only. Pt unable to coordinate use of UE and LE while maintaining straight path.    [facility] and all needs met.          Therapy Documentation Precautions:  Precautions Precautions: Fall Precaution Comments: right hemiplegia and neglect, extensor tone in the left elbow and in the shoulder Restrictions Weight Bearing Restrictions: No General:   Vital Signs:   Pain: Pain Assessment Pain Assessment: No/denies pain Pain Score: 0-No pain Mobility:   Locomotion :    Trunk/Postural Assessment :    Balance:   Exercises:   Other Treatments:     See Function Navigator for Current Functional Status.   Therapy/Group: Individual Therapy  ALorie Phenix3/13/2019, 1:34 PM

## 2017-04-18 NOTE — Progress Notes (Signed)
Occupational Therapy Session Note  Patient Details  Name: Ebony Williams MRN: [id number] Date of Birth: 05-Feb-1951  Today's Date: 04/18/2017 OT Individual Time: 3154-0086 OT Individual Time Calculation (min): 60 min    Short Term Goals: Week 1:  OT Short Term Goal 1 (Week 1): Pt will complete bathing with mod assist and mod cues for sequencing OT Short Term Goal 2 (Week 1): Pt will complete UB dressing with mod assist OT Short Term Goal 3 (Week 1): Pt will complete toilet transfer with max assist of 1 caregiver OT Short Term Goal 4 (Week 1): Pt will complete LB dressing with max assist of one caregiver  Skilled Therapeutic Interventions/Progress Updates:    Treatment session with focus on increased participation in self-care tasks.  Pt received upright in bed awake with daughter present.  Pt maintained arousal and eyes open ~80% of treatment session.  [name] reports pt incontinent of bowel.  Engaged in bed mobility with min assist with pt able to reach for bed rail to assist with rolling both Rt and Lt to allow therapist to complete hygiene.  Pt with increased conversation throughout session with 4-6 word sentences occasionally and answering basic questions.  Engaged in [place] bathing and dressing at bed level utilizing figure 4 position to increase success with washing lower legs and donning pants and socks.  Utilized rolling and bridging when donning pants.  Mod assist sidelying to sitting to EOB for UB bathing and dressing.  Pt with decreased tone at Rt shoulder this session with therapist able to lift arm to allow pt to wash underarm and apply deodorant.  Pt reports she has had another BM, therefore returned to bed and completed hygiene as above.  Left semi-reclined in bed with RUE positioned on pillow and with daughter present.  Therapy Documentation Precautions:  Precautions Precautions: Fall Precaution Comments: right hemiplegia and neglect, extensor tone in the left elbow and in the  shoulder Restrictions Weight Bearing Restrictions: No Pain: Pain Assessment Pain Assessment: No/denies pain Pain Score: 0-No pain  See Function Navigator for Current Functional Status.   Therapy/Group: Individual Therapy  Simonne Come 04/18/2017, 10:47 AM

## 2017-04-18 NOTE — Progress Notes (Signed)
[facility] PHYSICAL MEDICINE & REHABILITATION     PROGRESS NOTE  Subjective/Complaints:  Patient seen lying in bed this morning. She states she slept well overnight.  ROS: Denies CP, SOB, N/V/D.  Objective: Vital Signs: Blood pressure 138/72, pulse 83, temperature 98.1 F (36.7 C), temperature source Axillary, resp. rate 17, weight 76.5 kg (168 lb 10.4 oz), SpO2 96 %. Dg Chest 2 View  Result Date: 04/16/2017 CLINICAL DATA:  Patient admitted for right side stroke and now having severe lethargy today. No other complaints. EXAM: CHEST - 2 VIEW COMPARISON:  Chest x-ray dated 04/07/2017 and chest x-ray dated 06/01/2003. FINDINGS: Stable mild cardiomegaly. Overall cardiomediastinal silhouette is stable. Mild patchy bibasilar opacities, likely atelectasis or edema. Probable small left pleural effusion. No acute or suspicious osseous finding. IMPRESSION: 1. Mild patchy bibasilar opacities, most likely atelectasis or edema, perhaps slightly improved aeration at the left lung base compared to previous study of 04/07/2017. 2. Probable small left pleural effusion. 3. Stable mild cardiomegaly. Electronically Signed   By: Franki Cabot M.D.   On: 04/16/2017 11:23   Ct Head Wo Contrast  Result Date: 04/16/2017 CLINICAL DATA:  Onset of lethargy this morning in patient with acute left MCA and PCA infarcts by brain MRI 04/07/2017. EXAM: CT HEAD WITHOUT CONTRAST TECHNIQUE: Contiguous axial images were obtained from the base of the skull through the vertex without intravenous contrast. COMPARISON:  Head CT scan and brain MRI 04/07/2017. FINDINGS: Brain: There has been expected evolutionary change in the patient's left MCA and left MCA/PCA border zone infarcts. No hemorrhage is identified. No acute infarct, midline shift or abnormal extra-axial fluid collection. Chronic microvascular ischemic change is again seen. Vascular: Atherosclerosis is noted. Skull: Intact. Sinuses/Orbits: Mucosal thickening in the left  maxillary sinus has improved but mucosal thickening in the left frontal sinus appears slightly worse than on the most recent CT scan. There is some scattered ethmoid air cell disease. Minimal mucosal thickening right maxillary sinus noted. Other: None. IMPRESSION: No acute abnormality. The patient's left-sided infarct demonstrates expected evolutionary change. Chronic microvascular ischemic disease. Atherosclerosis. Left maxillary and left frontal sinus disease. Electronically Signed   By: Inge Rise M.D.   On: 04/16/2017 11:35   Recent Labs    04/16/17 1026  WBC 13.1*  HGB 16.8*  HCT 49.4*  PLT 256   Recent Labs    04/16/17 1026  NA 136  K 3.7  CL 100*  GLUCOSE 204*  BUN 22*  CREATININE 0.74  CALCIUM 9.2   CBG (last 3)  Recent Labs    04/16/17 1019  GLUCAP 200*    Wt Readings from Last 3 Encounters:  04/18/17 76.5 kg (168 lb 10.4 oz)  04/06/17 74.5 kg (164 lb 3.9 oz)  02/28/17 79.4 kg (175 lb)    Physical Exam:  BP 138/72 (BP Location: Left Arm)   Pulse 83   Temp 98.1 F (36.7 C) (Axillary)   Resp 17   Wt 76.5 kg (168 lb 10.4 oz)   SpO2 96%   BMI 31.87 kg/m  Constitutional: She appears well-developed and well-nourished.  HENT: Normocephalic and atraumatic.  Eyes: No discharge.   Cardiovascular: RRR. No JVD. Respiratory: Effort normal and breath sounds normal. GI: Bowel sounds are normal. Nondistended.  Musculoskeletal: No edema or tenderness in extremities  Neurological:  Alert Dysphonia Motor: LUE 4/5 proximal to distal LLE: 4/5 hip flexion, 4+/5 knee extension, 4+/5 ADF RUE/RLE: 0/5 proximal to distal  (stable) Skin: Skin is warm and dry.  Psychiatric: Flat.  Assessment/Plan: 1. 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 Physiatrist is providing close team supervision and 24 hour management of active medical  problems listed below. Physiatrist and rehab team continue to assess barriers to discharge/monitor patient progress toward functional and medical goals.  Function:  Bathing Bathing position   Position: Bed(bed level for LB, sitting EOB for UB)  Bathing parts Body parts bathed by patient: Right arm, Chest, Abdomen, Right upper leg, Left upper leg Body parts bathed by helper: Left arm, Front perineal area, Buttocks, Right lower leg, Left lower leg, Back  Bathing assist Assist Level: (Mod assist)      Upper Body Dressing/Undressing Upper body dressing   What is the patient wearing?: Hospital gown     Pull over shirt/dress - Perfomed by patient: Thread/unthread left sleeve, Put head through opening Pull over shirt/dress - Perfomed by helper: Pull shirt over trunk, Thread/unthread right sleeve        Upper body assist Assist Level: (Total assist)      Lower Body Dressing/Undressing Lower body dressing   What is the patient wearing?: Pants, Socks, Shoes       Pants- Performed by helper: Thread/unthread right pants leg, Thread/unthread left pants leg, Pull pants up/down   Non-skid slipper socks- Performed by helper: Don/doff right sock, Don/doff left sock   Socks - Performed by helper: Don/doff right sock, Don/doff left sock Shoes - Performed by patient: Don/doff left shoe Shoes - Performed by helper: Don/doff right shoe, Fasten right, Fasten left          Lower body assist Assist for lower body dressing: (Total assist)      Toileting Toileting Toileting activity did not occur: No continent bowel/bladder event        Toileting assist     Transfers Chair/bed transfer   Chair/bed transfer method: Stand pivot Chair/bed transfer assist level: Touching or steadying assistance (Pt > 75%) Chair/bed transfer assistive device: Mechanical lift Mechanical lift: Stedy   Locomotion Ambulation Ambulation activity did not occur: Safety/medical concerns   Max distance:  10 Assist level: Maximal assist (Pt 25 - 49%)   Wheelchair Wheelchair activity did not occur: Safety/medical concerns Type: Manual   Assist Level: Dependent (Pt equals 0%)  Cognition Comprehension Comprehension assist level: Understands basic 50 - 74% of the time/ requires cueing 25 - 49% of the time  Expression Expression assist level: Expresses basic 25 - 49% of the time/requires cueing 50 - 75% of the time. Uses single words/gestures.  Social Interaction Social Interaction assist level: Interacts appropriately 25 - 49% of time - Needs frequent redirection.  Problem Solving Problem solving assist level: Solves basic 25 - 49% of the time - needs direction more than half the time to initiate, plan or complete simple activities  Memory Memory assist level: Recognizes or recalls less than 25% of the time/requires cueing greater than 75% of the time    Medical Problem List and Plan: 1.  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  DVT Prophylaxis/Anticoagulation: SCDs. Monitor for any signs of DVT 3. Pain Management: Tylenol as needed 4. Mood: Effexor 75 mg daily, Xanax 0.5 mg 3 times a day as needed   Amantadine 100 mg with breakfast and lunch started on 3/9  5. Neuropsych: This patient is not capable of making decisions on his own behalf. 6. Skin/Wound Care: Routine skin checks 7. Fluids/Electrolytes/Nutrition: Routine I&O's   BUN/Cr trending up, encourage fluids 8. Hypertension.    HCTZ 12.5 mg daily, changed to hygrotin 25 mg on 3/8, increased to 50 mg on 3/11   Avapro 300 mg daily.    Monitor with increased mobility   Improving on 3/13 9. Dysphagia. Dysphagia #1 nectar liquids. Follow-up speech therapy 10. Prediabetes   Continue to monitor   Elevated on 3/11, cont to monitor 11. Transaminitis   Continue to monitor   Elevated, but stable on 3/11    Cont to monitor, will consider decreasing statin 12. Leukocytosis   WBCs 13.1 on 3/11   UA equivocal, Ucx no growth   CXR reviewed, stable   Cont to monitor  LOS (Days) 7 A FACE TO FACE EVALUATION WAS PERFORMED  Paiden Caraveo [name] 04/18/2017 8:28 AM

## 2017-04-19 ENCOUNTER — Inpatient Hospital Stay (HOSPITAL_COMMUNITY): Payer: Medicare Other | Admitting: Speech Pathology

## 2017-04-19 ENCOUNTER — Inpatient Hospital Stay (HOSPITAL_COMMUNITY): Payer: Medicare Other | Admitting: Physical Therapy

## 2017-04-19 ENCOUNTER — Inpatient Hospital Stay (HOSPITAL_COMMUNITY): Payer: Medicare Other | Admitting: Occupational Therapy

## 2017-04-19 ENCOUNTER — Inpatient Hospital Stay (HOSPITAL_COMMUNITY): Payer: Medicare Other

## 2017-04-19 MED ORDER — CHLORTHALIDONE 25 MG PO TABS
75.0000 mg | ORAL_TABLET | Freq: Every day | ORAL | Status: DC
  Administered 2017-04-19 – 2017-04-20 (×2): 75 mg via ORAL
  Filled 2017-04-19 (×2): qty 1

## 2017-04-19 NOTE — Patient Care Conference (Signed)
Inpatient RehabilitationTeam Conference and Plan of Care Update Date: 04/18/2017   Time: 2:40 PM    Patient Name: Ebony Williams [facility]      Medical Record Number: [id number]  Date of Birth: 02-28-1950 Sex: Female         Room/Bed: 4M05C/4M05C-01 Payor Info: Payor: [employer] MEDICARE / Plan: [employer] MEDICARE / Product Type: *No Product type* /    Admitting Diagnosis: CVA  Admit Date/Time:  04/11/2017  4:33 PM Admission Comments: No comment available   Primary Diagnosis:  <principal problem not specified> Principal Problem: <principal problem not specified>  Patient Active Problem List   Diagnosis Date Noted  . Elevated BUN   . Lethargy   . Transaminitis   . Essential hypertension   . Right hemiplegia ([place])   . Hypertensive crisis   . 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 Depression   . Dysphagia, post-stroke   . Diastolic dysfunction   . Benign essential HTN   . Prediabetes   . Leukocytosis   . 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 Stroke (cerebrum) ([place]) 04/06/2017  . 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 Pain in joint, shoulder region 10/18/2011    Expected Discharge Date: Expected Discharge Date: 05/05/17  Team Members Present: Physician leading conference: Dr. Delice Lesch Social Worker Present: Lennart Pall, LCSW Nurse Present: Benjie Karvonen, RN PT Present: Leavy Cella, PT OT Present: Simonne Come, OT SLP Present: Windell Moulding, SLP     Current Status/Progress Goal Weekly Team Focus  Medical   Right side weakness with aphasia secondary to multifocal ischemic left MCA and PCA infarcts on 04/06/17  Improve mobility, lethargy, HTN  See above   Bowel/Bladder   incont b/b; lbm 3/12  continent with timed toileting  timed toileting q2-3hrs while awake   Swallow/Nutrition/ Hydration   Dys 1, nectar thick liquids, trials of thin liquids with SLP only   supervision   trials of thin liquids to continue working  towards repeat MBS   ADL's   Mod assist bathing and UB dressing, total assist LB bathing/dressing, max assist transfers  Min assist overall  arousal, activity tolerance, ADL retraining, transfers   Mobility   max/total A transfers, min A sitting balance  min A  balance, activity tolerance, NMR   Communication   mod-severe expressive>receptive aphasia   min assist   naming, expression at the word level, increased vocal intensity    Safety/Cognition/ Behavioral Observations  moderate-severe deficits, right inattention, fluctuating alertness and decreased sustained attention to task.   min assist   attention, alertness, visual scanning to the right of midline    Pain   faces=0  maintain  assess q shift and prn   Skin   mild MASD to buttocks  free from infection and breakdown  assess q shift and prn    Rehab Goals Patient on target to meet rehab goals: Yes Rehab Goals Revised: none *See Care Plan and progress notes for long and short-term goals.     Barriers to Discharge  Current Status/Progress Possible Resolutions Date Resolved   Physician    Medical stability     See above  Therapies, neurostimulant, optimize BP meds      Nursing                  PT                    OT  SLP Decreased caregiver support              SW                Discharge Planning/Teaching Needs:  Pt's family is trying to decide what will be best for pt when her time on CIR is finished.  Dtr and husband are trying to figure out how work together to care for pt at home. Husband has mentioned possibly hiring caregivers.  Pt's dtr has begun education, but husband will also need to go through training if he will be a caregiver.   Team Discussion:  Pt with some mild blood pressure issues, but was able to be weaned off O2.  Pt is slightly "perkier" today.  Dtr is here in mornings and has begun education.  Pt is min A standing/sitting balance; max A transfers.  Pt has no active movement in  her right leg.  Diet is now D1, nectar thick liquids with trials of thins with ST.  Pt needs max A for communication due to expressive aphasia.  ST may do a MBS end of week, pending progress with trials.  Pt has min A goals,, mostly from the wheelchair.  Revisions to Treatment Plan:  none    Continued Need for Acute Rehabilitation Level of Care: The patient requires daily medical management by a physician with specialized training in physical medicine and rehabilitation for the following conditions: Daily direction of a multidisciplinary physical rehabilitation program to ensure safe treatment while eliciting the highest outcome that is of practical value to the patient.: Yes Daily medical management of patient stability for increased activity during participation in an intensive rehabilitation regime.: Yes Daily analysis of laboratory values and/or radiology reports with any subsequent need for medication adjustment of medical intervention for : Neurological problems;Blood pressure problems;Other  Ebony Williams, Ebony Williams 04/19/2017, 12:17 PM

## 2017-04-19 NOTE — Progress Notes (Signed)
Occupational Therapy Weekly Progress Note  Patient Details  Name: Ebony Williams MRN: [id number] Date of Birth: [birth year]  Beginning of progress report period: April 12, 2017 End of progress report period: April 19, 2017  Today's Date: 04/19/2017 OT Individual Time: 1572-6203 OT Individual Time Calculation (min): 70 min    Patient has met 2 of 4 short term goals.  Pt is making steady progress towards goals.  Pt is demonstrating improved arousal and therefore increased ability to participate in treatment sessions.  Pt currently requires min assist for dynamic sitting balance and max assist for squat pivot transfers.  Secondary to pt being incontinent of bowel and bladder, decreased focus has been placed on toilet transfers.  Have been utilizing Stedy for sit > stand with pt able to complete with min assist.  Pt continues to demonstrate dense RUE/RLE hemiplegia with increased tone.  Education on proper positioning to decrease pain and tone as well as provided handouts for positioning.    Patient continues to demonstrate the following deficits: muscle weakness, decreased oxygen support, impaired timing and sequencing, abnormal tone, unbalanced muscle activation, motor apraxia, decreased coordination and decreased motor planning, decreased visual perceptual skills, decreased attention to right, decreased initiation, decreased attention, decreased awareness, decreased problem solving and decreased safety awareness and decreased standing balance, decreased postural control, hemiplegia and decreased balance strategies and therefore will continue to benefit from skilled OT intervention to enhance overall performance with BADL and Reduce care partner burden.  Patient progressing toward long term goals..  Continue plan of care.  OT Short Term Goals Week 1:  OT Short Term Goal 1 (Week 1): Pt will complete bathing with mod assist and mod cues for sequencing OT Short Term Goal 1 - Progress (Week 1): Met OT  Short Term Goal 2 (Week 1): Pt will complete UB dressing with mod assist OT Short Term Goal 2 - Progress (Week 1): Met OT Short Term Goal 3 (Week 1): Pt will complete toilet transfer with max assist of 1 caregiver OT Short Term Goal 3 - Progress (Week 1): Progressing toward goal OT Short Term Goal 4 (Week 1): Pt will complete LB dressing with max assist of one caregiver OT Short Term Goal 4 - Progress (Week 1): Progressing toward goal Week 2:  OT Short Term Goal 1 (Week 2): Pt will complete toilet transfer with max assist of 1 caregiver OT Short Term Goal 2 (Week 2): Pt will complete LB dressing with max assist of one caregiver OT Short Term Goal 3 (Week 2): Pt will complete bathing with min assist OT Short Term Goal 4 (Week 2): Pt will complete UB dressing with min assist  Skilled Therapeutic Interventions/Progress Updates:    Treatment session with focus on self-care retraining and arousal.  Pt received in bed, awake, with daughter visiting.  Pt willing to engage in bathing at shower level with encouragement.  Utilized Stedy for transfer bed > roll in shower chair.  Pt able to complete sit > stand into Stedy with min assist.  Manual facilitation for weight shift to achieve proper sitting position in roll in shower chair.  Mod cues for participation and sequencing during bathing at shower level, therapist completing increased aspects of bathing this session due to decreased weight shift to wash lower legs from sitting position.  Tone persisting in RUE, impacting ability to incorporate in washing with hand over hand.  Engaged in sit > stand in [place] to complete LB dressing, requiring total assist at sit > stand level.  Completed UB dressing with increased time and assist to thread RUE due to tone.  Pt reports increased fatigue and requested to return to bed at end of session.  Therapy Documentation Precautions:  Precautions Precautions: Fall Precaution Comments: right hemiplegia and neglect,  extensor tone in the left elbow and in the shoulder Restrictions Weight Bearing Restrictions: No Pain:  Pt with no c/o pain  See Function Navigator for Current Functional Status.   Therapy/Group: Individual Therapy  Simonne Come 04/19/2017, 1:27 PM

## 2017-04-19 NOTE — Progress Notes (Signed)
Friend at bedside states pt c/o chest pain. VS WNL, no other c/o, pt reports that not new. Notified PA, will admin PRN xanax due to hx and if not resolved will do EKG, HR is reg at this time.

## 2017-04-19 NOTE — Progress Notes (Signed)
[facility]     PROGRESS NOTE  Subjective/Complaints:  Patient seen lying in bed this morning. She states she slept well overnight. She has little to offer otherwise.  ROS: Denies CP, SOB, N/V/D.  Objective: Vital Signs: Blood pressure (!) 156/77, pulse 78, temperature 97.6 F (36.4 C), temperature source Axillary, resp. rate 20, weight 76.5 kg (168 lb 10.4 oz), SpO2 95 %. No results found. Recent Labs    04/16/17 1026  WBC 13.1*  HGB 16.8*  HCT 49.4*  PLT 256   Recent Labs    04/16/17 1026  NA 136  K 3.7  CL 100*  GLUCOSE 204*  BUN 22*  CREATININE 0.74  CALCIUM 9.2   CBG (last 3)  Recent Labs    04/16/17 1019  GLUCAP 200*    Wt Readings from Last 3 Encounters:  04/18/17 76.5 kg (168 lb 10.4 oz)  04/06/17 74.5 kg (164 lb 3.9 oz)  02/28/17 79.4 kg (175 lb)    Physical Exam:  BP (!) 156/77 (BP Location: Left Arm)   Pulse 78   Temp 97.6 F (36.4 C) (Axillary)   Resp 20   Wt 76.5 kg (168 lb 10.4 oz)   SpO2 95%   BMI 31.87 kg/m  Constitutional: She appears well-developed and well-nourished.  HENT: Normocephalic and atraumatic.  Eyes: No discharge.   Cardiovascular: RRR. No JVD. Respiratory: Effort normal and breath sounds normal. GI: Bowel sounds are normal. Nondistended.  Musculoskeletal: No edema or tenderness in extremities  Neurological:  Alert Dysphonia Motor: LUE 4/5 proximal to distal LLE: 4/5 hip flexion, 4+/5 knee extension, 4+/5 ADF RUE/RLE: 0/5 proximal to distal  (unchanged) Skin: Skin is warm and dry.  Psychiatric: Flat.  Assessment/Plan: 1. 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 Physiatrist is providing close team supervision and 24 hour management of active medical problems listed below. Physiatrist and rehab team continue to assess barriers to  discharge/monitor patient progress toward functional and medical goals.  Function:  Bathing Bathing position   Position: Bed(bed level for LB, EOB for UB)  Bathing parts Body parts bathed by patient: Right arm, Chest, Abdomen, Right upper leg, Left upper leg, Right lower leg Body parts bathed by helper: Left arm, Front perineal area, Buttocks, Left lower leg, Back  Bathing assist Assist Level: (Mod assist)      Upper Body Dressing/Undressing Upper body dressing   What is the patient wearing?: Pull over shirt/dress     Pull over shirt/dress - Perfomed by patient: Thread/unthread left sleeve, Put head through opening Pull over shirt/dress - Perfomed by helper: Thread/unthread right sleeve, Pull shirt over trunk        Upper body assist Assist Level: (Mod assist)      Lower Body Dressing/Undressing Lower body dressing   What is the patient wearing?: Pants, Socks, Shoes       Pants- Performed by helper: Thread/unthread right pants leg, Thread/unthread left pants leg, Pull pants up/down   Non-skid slipper socks- Performed by helper: Don/doff right sock, Don/doff left sock   Socks - Performed by helper: Don/doff right sock, Don/doff left sock Shoes - Performed by patient: Don/doff left shoe Shoes - Performed by helper: Don/doff right shoe, Fasten right, Fasten left          Lower body assist Assist for lower body dressing: (Total assist)      Toileting Toileting  Toileting activity did not occur: No continent bowel/bladder event        Toileting assist     Transfers Chair/bed transfer   Chair/bed transfer method: Stand pivot Chair/bed transfer assist level: Touching or steadying assistance (Pt > 75%) Chair/bed transfer assistive device: Mechanical lift Mechanical lift: Stedy   Locomotion Ambulation Ambulation activity did not occur: Safety/medical concerns   Max distance: 10 Assist level: Maximal assist (Pt 25 - 49%)   Wheelchair Wheelchair activity did not  occur: Safety/medical concerns Type: Manual   Assist Level: Dependent (Pt equals 0%)  Cognition Comprehension Comprehension assist level: Understands basic 75 - 89% of the time/ requires cueing 10 - 24% of the time  Expression Expression assist level: Expresses basic 50 - 74% of the time/requires cueing 25 - 49% of the time. Needs to repeat parts of sentences.  Social Interaction Social Interaction assist level: Interacts appropriately 25 - 49% of time - Needs frequent redirection.  Problem Solving Problem solving assist level: Solves basic 50 - 74% of the time/requires cueing 25 - 49% of the time  Memory Memory assist level: Recognizes or recalls 25 - 49% of the time/requires cueing 50 - 75% of the time    Medical Problem List and Plan: 1.  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  DVT Prophylaxis/Anticoagulation: SCDs. Monitor for any signs of DVT 3. Pain Management: Tylenol as needed 4. Mood: Effexor 75 mg daily, Xanax 0.5 mg 3 times a day as needed   Amantadine 100 mg with breakfast and lunch started on 3/9, improving after discussion with therapies, will consider increase next week 5. Neuropsych: This patient is not capable of making decisions on his own behalf. 6. Skin/Wound Care: Routine skin checks 7. Fluids/Electrolytes/Nutrition: Routine I&O's   BUN/Cr trending up, encourage fluids   Labs ordered for tomorrow 8. Hypertension.    HCTZ 12.5 mg daily, changed to hygrotin 25 mg on 3/8, increased to 50 mg on 3/11, increase to 75 on 3/14   Avapro 300 mg daily.    Monitor with increased mobility 9. Dysphagia. Dysphagia #1 nectar liquids. Follow-up speech therapy 10. Prediabetes   Continue to monitor   Elevated on 3/11, cont to monitor 11. Transaminitis   Continue to monitor   Elevated, but stable on 3/11   Labs ordered for tomorrow   Cont to  monitor, will consider decreasing statin 12. Leukocytosis   WBCs 13.1 on 3/11   UA equivocal, Ucx no growth   CXR reviewed, stable   Labs ordered for tomorrow   Cont to monitor  LOS (Days) 8 A FACE TO FACE EVALUATION WAS PERFORMED  Markee Matera [name] 04/19/2017 8:19 AM

## 2017-04-19 NOTE — Progress Notes (Signed)
Social Work Patient ID: Ebony Williams, female   DOB: Jul 05, 1950, 67 y.o.   MRN: [id number]   CSW met with pt and her dtr today to update them on yesterday's team conference discussion.  Pt was more communicative.  Dtr is concerned if pt's husband can provide the level of care pt will need at home and pt is not feeling he will be able to do personal care, nor does she want him to.  Dtr said that husband mentioned hiring an aide, but she is not sure about where that stands.  Dtr is trying to see how she can assist with her care; she works and has a 43 month old and 69 y/o.  It seems it may be helpful to have the husband and dtr and pt and CSW all meet to talk about pt's plan sometime next week.  CSW will update husband, as well.

## 2017-04-19 NOTE — Progress Notes (Signed)
Speech Language Pathology Weekly Progress and Session Note  Patient Details  Name: Ebony Williams MRN: [id number] Date of Birth: 1950-12-25  Beginning of progress report period:  April 12, 2017  End of progress report period: April 19, 2017   Today's Date: 04/19/2017 SLP Individual Time: 0800-0900 SLP Individual Time Calculation (min): 60 min  Short Term Goals: Week 1: SLP Short Term Goal 1 (Week 1): Pt will verbally convey her needs and wants to caregivers at the word level in 50% of opportunities with mod assist verbal cues. SLP Short Term Goal 1 - Progress (Week 1): Met SLP Short Term Goal 2 (Week 1): Pt will name basic, familiar objects for 75% accuracy with mod assist verbal cues for word finding.  SLP Short Term Goal 2 - Progress (Week 1): Met SLP Short Term Goal 3 (Week 1): Pt will locate items to the right of midline during basic, familiar tasks in 50% of opportunities with mod assist verbal cues.  SLP Short Term Goal 3 - Progress (Week 1): Met SLP Short Term Goal 4 (Week 1): Pt will sustain her attention to basic, familiar tasks for 3 minute intervals with mod assist verbal cues for redirection.   SLP Short Term Goal 4 - Progress (Week 1): Met SLP Short Term Goal 5 (Week 1): Pt will consume dys 1 textures and nectar thick liquids with min assist verbal cues for use of swallowing precautions.  SLP Short Term Goal 5 - Progress (Week 1): Met SLP Short Term Goal 6 (Week 1): Pt will consume therapeutic trials of thin liquids with min assist verbal cues for use of swallowing precautions.   SLP Short Term Goal 6 - Progress (Week 1): Met    New Short Term Goals: Week 2: SLP Short Term Goal 1 (Week 2): Pt will verbally convey her needs and wants to caregivers at the word level in 75% of opportunities with min assist verbal cues. SLP Short Term Goal 2 (Week 2): Pt will name basic, familiar objects for 75% accuracy with min assist verbal cues for word finding.  SLP Short Term Goal 3 (Week  2): Pt will locate items to the right of midline during basic, familiar tasks in 75% of opportunities with min assist verbal cues.  SLP Short Term Goal 4 (Week 2): Pt will consume dys 1 textures and nectar thick liquids with supervision cues for use of swallowing precautions.  SLP Short Term Goal 5 (Week 2): Pt will consume therapeutic trials of thin liquids with supervision verbal cues for use of swallowing precautions.   SLP Short Term Goal 6 (Week 2): Pt will sustain her attention to basic, familiar tasks for 5 minute intervals with min assist verbal cues for redirection.    Weekly Progress Updates:   Pt has made functional gains this reporting period and has met 6 out of 6 short term goals.  Pt is currently mod-max assist for tasks due to expressive aphasia and cognitive deficits.  Pt has demonstrated improved alertness,spontaneous verbal expression, word finding during functional tasks, sustained attention to tasks, and visual scanning to the right of midline .  Pt is consuming dys 1, nectar thick liquids diet with min assist cues for use of swallowing precautions and is tolerating trials of thin liquids with SLP.  Pt and family education is ongoing.  Pt would continue to benefit from skilled ST while inpatient in order to maximize functional independence and reduce burden of care prior to discharge.  Anticipate that pt will need 24/7  supervision at discharge in addition to [facility] follow up at next level of care.      Intensity: Minumum of 1-2 x/day, 30 to 90 minutes Frequency: 3 to 5 out of 7 days Duration/Length of Stay: 21 days  Treatment/Interventions: Cognitive remediation/compensation;Cueing hierarchy;Dysphagia/aspiration precaution training;Environmental controls;Internal/external aids;Multimodal communication approach;Speech/Language facilitation;Patient/family education;Functional tasks   Daily Session  Skilled Therapeutic Interventions: Pt was seen for skilled ST targeting family  education.  [name] was present with nurse tech upon therapist's arrival.  Daughter demonstrated appropriate cuing and attentiveness to pt during meal and is now signed off to supervise pt during meals.  SLP also discussed communicating with nursing to make sure that pt is either positioned appropriately in bed or out of bed prior to initiation of meals to ensure safe PO intake.  Daughter verbalized understanding.  SLP also provided skilled education regarding techniques to maximize pt's independence for functional communication, emphasizing allowing pt extra time for communication, providing semantic cues to assist in word finding, and asking close ended questions rather than open ended.   [name] verbalized many concerns regarding pt's husband's ability to care for her.  Pt's husband has sought out opportunities for education but otherwise has been minimally engaged in therapy sessions, either excusing himself or falling asleep during sessions.   Therapist discussed team's recommendations and anticipated outcomes.  Pt herself was able to verbalize that she also had concerns about going home with her husband.  Pt eft in bed with call bell within reach.  Goals updated on this date to reflect current progress and plan of care.        Function:   Eating Eating   Modified Consistency Diet: Yes Eating Assist Level: Set up assist for;Supervision or verbal cues   Eating Set Up Assist For: Opening containers       Cognition Comprehension Comprehension assist level: Understands basic 75 - 89% of the time/ requires cueing 10 - 24% of the time  Expression   Expression assist level: Expresses basic 50 - 74% of the time/requires cueing 25 - 49% of the time. Needs to repeat parts of sentences.  Social Interaction Social Interaction assist level: Interacts appropriately 50 - 74% of the time - May be physically or verbally inappropriate.  Problem Solving Problem solving assist level: Solves basic 25  - 49% of the time - needs direction more than half the time to initiate, plan or complete simple activities  Memory Memory assist level: Recognizes or recalls 25 - 49% of the time/requires cueing 50 - 75% of the time   General    Pain Pain Assessment Pain Assessment: No/denies pain Pain Score: Asleep  Therapy/Group: Individual Therapy  Helyn Schwan, Elmyra Ricks L 04/19/2017, 9:30 AM

## 2017-04-19 NOTE — Progress Notes (Signed)
Physical Therapy Session Note  Patient Details  Name: Ebony Williams MRN: [id number] Date of Birth: 10-21-1950  Today's Date: 04/19/2017 PT Individual Time: 1330-1415 PT Individual Time Calculation (min): 45 min   Short Term Goals: Week 1:  PT Short Term Goal 1 (Week 1): Pt will maintain sitting balance EOB with CGA PT Short Term Goal 2 (Week 1): Pt will perform squat pivot transfer Mod A consistently PT Short Term Goal 3 (Week 1): Pt will initiate w/c propulsion training  Skilled Therapeutic Interventions/Progress Updates:    PT greeted supine in bed, husband present in room but not throughout session. Pt reports is tired today but agreeable to therapy with encouragement, husband questioning when pt should wear splint on R hand, provided education that it is a night splint. Pt performs sit<>stand from bed using RW with minA to boost, reports feels dizzy and wants to lie back down. BP in supine 147/74. Pt transitions supine to sit with supervision, increased time. BP in sitting 144/73. Pt performs lateral scoot transfer bed<>W/C with modA to clear hips. Pt reports she does not feel well and husband comments that she seems "a little out of it" Pt increases alertness and communication throughout session. Performs sit<>stand from W/C using hallway railing on L with minA and blocking at R knee. Ambulates 2 x 10 feet with maxA for balance, manual facilitation of blocking at R knee, anterior weight shift over RLE to advance LLE, advancement and placement of RLE. Pt RLE tends to ER and invert with progression, pt with decreased reciprocal pattern, often attempts to step with LLE while R foot not fully placed on ground. Requires max cuing for gait pattern that improves with repetition. Pt R knee buckles x 1, requires W/C follow for safety. At end of session, pt returned to room, transfers W/C >bed using lateral scoot with minA, max A for lifting BLE sit >supine. At end of session, pt left in supine with  husband present, denies any needs at this time.   Therapy Documentation Precautions:  Precautions Precautions: Fall Precaution Comments: right hemiplegia and neglect, extensor tone in the left elbow and in the shoulder Restrictions Weight Bearing Restrictions: No Vital Signs: Therapy Vitals Temp: 98.1 F (36.7 C) Temp Source: Oral Pulse Rate: 83 Resp: 17 BP: 130/70 Patient Position (if appropriate): Lying Oxygen Therapy SpO2: 94 % O2 Device: Room Air Pain: Pain Assessment Pain Score: 0-No pain  See Function Navigator for Current Functional Status.   Therapy/Group: Individual Therapy  Salvatore Decent 04/19/2017, 2:33 PM

## 2017-04-19 NOTE — Progress Notes (Signed)
Placed pt on cpap, pt tolerating well at this time.

## 2017-04-19 NOTE — Progress Notes (Signed)
Pt reports no chest pain and feels much better now.  Pt reported that thinks was "overwhelmed" at time.

## 2017-04-19 NOTE — Progress Notes (Addendum)
Physical Therapy Note  Patient Details  Name: Ebony Williams MRN: [id number] Date of Birth: 12/09/1950 Today's Date: 04/19/2017  0900-0930, 30 min individual tx Pain: none per pt  Dtr Ebony Williams observed session.  Pt seen bedside.  She was fatigued and needed multiple rest breaks during session.  Pt needed mod cues to attend to R .  PROM R hip, knee, and ankle.  Bedside RLE neuromuscular re-education via multimodal cues and positioning for supine: mass extension from passive mass flexion, hip abduction/adduction, bil hip internal rotation, 10 x 1 each;   bil bridging, bil lower trunk rotation, cervical flexion, 10 x 2 each.  Pt demonstrated active hip and knee ext, hip abd/adduction, hip internal rotation.  From supine, blocked practice to roll > R , without use of rails after set-up of hooklying position; mod cues to turn head, and reach with L arm. With practice, pt able to perform with supervision after set-up.   Pt left resting in bed with alarm set and all needs within reach.  See function navigator for current status.   Ebony Williams 04/19/2017, 8:50 AM

## 2017-04-20 ENCOUNTER — Inpatient Hospital Stay (HOSPITAL_COMMUNITY): Payer: Medicare Other | Admitting: Occupational Therapy

## 2017-04-20 ENCOUNTER — Inpatient Hospital Stay (HOSPITAL_COMMUNITY): Payer: Medicare Other | Admitting: Physical Therapy

## 2017-04-20 ENCOUNTER — Inpatient Hospital Stay (HOSPITAL_COMMUNITY): Payer: Medicare Other | Admitting: Speech Pathology

## 2017-04-20 LAB — CBC WITH DIFFERENTIAL/PLATELET
BASOS ABS: 0 10*3/uL (ref 0.0–0.1)
BASOS PCT: 0 %
Eosinophils Absolute: 0.2 10*3/uL (ref 0.0–0.7)
Eosinophils Relative: 1 %
HEMATOCRIT: 49.9 % — AB (ref 36.0–46.0)
Hemoglobin: 16.9 g/dL — ABNORMAL HIGH (ref 12.0–15.0)
LYMPHS PCT: 18 %
Lymphs Abs: 2.8 10*3/uL (ref 0.7–4.0)
MCH: 31.1 pg (ref 26.0–34.0)
MCHC: 33.9 g/dL (ref 30.0–36.0)
MCV: 91.9 fL (ref 78.0–100.0)
Monocytes Absolute: 0.8 10*3/uL (ref 0.1–1.0)
Monocytes Relative: 5 %
NEUTROS ABS: 12 10*3/uL — AB (ref 1.7–7.7)
Neutrophils Relative %: 76 %
Platelets: 297 10*3/uL (ref 150–400)
RBC: 5.43 MIL/uL — ABNORMAL HIGH (ref 3.87–5.11)
RDW: 13.9 % (ref 11.5–15.5)
WBC: 15.8 10*3/uL — ABNORMAL HIGH (ref 4.0–10.5)

## 2017-04-20 LAB — COMPREHENSIVE METABOLIC PANEL
ALT: 67 U/L — ABNORMAL HIGH (ref 14–54)
AST: 61 U/L — AB (ref 15–41)
Albumin: 3.4 g/dL — ABNORMAL LOW (ref 3.5–5.0)
Alkaline Phosphatase: 78 U/L (ref 38–126)
Anion gap: 15 (ref 5–15)
BUN: 24 mg/dL — ABNORMAL HIGH (ref 6–20)
CO2: 22 mmol/L (ref 22–32)
Calcium: 9.3 mg/dL (ref 8.9–10.3)
Chloride: 97 mmol/L — ABNORMAL LOW (ref 101–111)
Creatinine, Ser: 0.82 mg/dL (ref 0.44–1.00)
Glucose, Bld: 213 mg/dL — ABNORMAL HIGH (ref 65–99)
POTASSIUM: 3.7 mmol/L (ref 3.5–5.1)
SODIUM: 134 mmol/L — AB (ref 135–145)
Total Bilirubin: 0.9 mg/dL (ref 0.3–1.2)
Total Protein: 7.9 g/dL (ref 6.5–8.1)

## 2017-04-20 MED ORDER — CHLORTHALIDONE 25 MG PO TABS
25.0000 mg | ORAL_TABLET | Freq: Every day | ORAL | Status: DC
  Administered 2017-04-21 – 2017-05-08 (×18): 25 mg via ORAL
  Filled 2017-04-20 (×18): qty 1

## 2017-04-20 MED ORDER — CHLORTHALIDONE 50 MG PO TABS: 50.0000 mg | ORAL_TABLET | Freq: Every day | ORAL | Status: DC

## 2017-04-20 NOTE — Progress Notes (Signed)
Physical Therapy Note  Patient Details  Name: Ebony Williams MRN: [id number] Date of Birth: 06-22-1950 Today's Date: 04/20/2017    Time: 1000-1055 55 minutes  1:1 No c/o pain.  Pt performs rolling to don pants with min A to Rt, max A to Lt.  Max A supine to sit.  Squat pivot transfers throughout session with mod A.  Gait at railing in hallway with facilitation for Rt LE placement, pelvis alignment and control, wt shifting. Pt with pain when Rt knee hyperextends, attempted to wrap with ace wrap to decrease hyperextension, pt still with pain.  kinetron with bilat LEs with pt with good Rt hip activation for 3 x 2 minutes of kinetron.  W/c mobility with hemi technique initially min/mod A for steering, progresses to close supervision with cues for attention to the Rt. Pt left in bed with husband present, alarm on.  [name],[name] 04/20/2017, 10:58 AM

## 2017-04-20 NOTE — Progress Notes (Signed)
Physical Therapy Weekly Progress Note  Patient Details  Name: Ebony Williams MRN: [id number] Date of Birth: 1950/03/12  Beginning of progress report period: April 12, 2017 End of progress report period: April 20, 2017   Patient has met 0 of 3 short term goals.  Pt with slow progress, limited by fatigue and blood pressure issues. Pt has improved activity tolerance and is becoming more participatory with therapy. Pt continues at max A for transfers and min A for sitting balance.  Initiating pre gait and standing with max/total A.  Patient continues to demonstrate the following deficits muscle weakness, abnormal tone, decreased coordination and decreased motor planning, decreased attention, decreased awareness, decreased safety awareness and delayed processing and decreased sitting balance, decreased standing balance, decreased postural control, hemiplegia and decreased balance strategies and therefore will continue to benefit from skilled PT intervention to increase functional independence with mobility.  Patient progressing toward long term goals..  Continue plan of care.  PT Short Term Goals Week 1:  PT Short Term Goal 1 (Week 1): Pt will maintain sitting balance EOB with CGA PT Short Term Goal 1 - Progress (Week 1): Progressing toward goal PT Short Term Goal 2 (Week 1): Pt will perform squat pivot transfer Mod A consistently PT Short Term Goal 2 - Progress (Week 1): Progressing toward goal PT Short Term Goal 3 (Week 1): Pt will initiate w/c propulsion training PT Short Term Goal 3 - Progress (Week 1): Progressing toward goal Week 2:  PT Short Term Goal 1 (Week 2): pt will consistently perform transfers with mod A PT Short Term Goal 2 (Week 2): pt will propel w/c in controlled environment x 25' with min A  Skilled Therapeutic Interventions/Progress Updates:  Ambulation/gait training;Balance/vestibular training;Cognitive remediation/compensation;Community reintegration;Discharge  planning;DME/adaptive equipment instruction;Functional electrical stimulation;Functional mobility training;Neuromuscular re-education;Patient/family education;Psychosocial support;Splinting/orthotics;Stair training;Therapeutic Activities;Therapeutic Exercise;UE/LE Strength taining/ROM;UE/LE Coordination activities;Visual/perceptual remediation/compensation;Wheelchair propulsion/positioning     See Function Navigator for Current Functional Status.   Taunia Frasco 04/20/2017, 8:14 AM

## 2017-04-20 NOTE — Progress Notes (Signed)
Occupational Therapy Session Note  Patient Details  Name: Ebony Williams MRN: [id number] Date of Birth: 05-05-1950  Today's Date: 04/20/2017 OT Individual Time: 3086-5784 and 1130-1200 OT Individual Time Calculation (min): 75 min and 30 min   Short Term Goals: Week 2:  OT Short Term Goal 1 (Week 2): Pt will complete toilet transfer with max assist of 1 caregiver OT Short Term Goal 2 (Week 2): Pt will complete LB dressing with max assist of one caregiver OT Short Term Goal 3 (Week 2): Pt will complete bathing with min assist OT Short Term Goal 4 (Week 2): Pt will complete UB dressing with min assist  Skilled Therapeutic Interventions/Progress Updates:    1) Treatment session with focus on increased participation in self-care tasks of bathing and dressing.  Pt received supine in bed asleep, required increased time and turning on of lights to increase arousal.  Engaged in bathing and LB dressing at bed level with pt demonstrating increased participation and initiation with rolling Rt and Lt to allow therapist to complete hygiene (as pt incontinent of bladder).  Mod facilitation for bridging to assist with LB dressing, then utilizing rolling to fully secure pants over hips.  Incorporated figure 4 positioning in bed to wash lower legs and when seated EOB to don socks and shoes.  Pt completed sidelying to sitting with min assist this session.  Reports initial lightheadedness upon sitting EOB, however subsided quickly.  Engaged in [place] bathing and dressing seated EOB with min guard for sitting balance.  Therapist assisted with positioning of RUE to allow pt to wash underarm and then attempt threading sleeve.  Engaged in sit > stand from EOB with pt able to initiate and complete with min assist. Educated on squat pivot for increased safety with transfers due to decreased ability to advance RLE.  Completed squat pivot transfer with mod assist.  Left upright in w/c with daughter present to provide supervision  with breakfast tray.  2) Treatment session with focus on RUE NMR.  Pt received in bed asleep, pt awakened and reports fatigue from AM sessions.  Engaged in PROM to RUE with focus on decreasing tone.  Focus on shoulder flexion/extension, abduction/adduction, and internal/external rotation with ability to decrease tone.  However when focus changed to different joint, tightness would return.  Addressed elbow flexion/extension and wrist flexion/extension and ulnar/radial deviation.  Discussed tone with pt and husband and reiterated appropriate positioning and splinting at night.  Therapy Documentation Precautions:  Precautions Precautions: Fall Precaution Comments: right hemiplegia and neglect, extensor tone in the left elbow and in the shoulder Restrictions Weight Bearing Restrictions: No General:   Vital Signs: Therapy Vitals Temp: 98 F (36.7 C) Temp Source: Oral Pulse Rate: 95 Resp: 18 BP: (!) 156/74 Patient Position (if appropriate): Sitting Oxygen Therapy SpO2: 96 % O2 Device: Room Air O2 Flow Rate (L/min): 2 L/min Pain:  Pt with no c/o pain  See Function Navigator for Current Functional Status.   Therapy/Group: Individual Therapy  Simonne Come 04/20/2017, 9:57 AM

## 2017-04-20 NOTE — Progress Notes (Signed)
[place] PHYSICAL MEDICINE & REHABILITATION     PROGRESS NOTE  Subjective/Complaints:  Pt sleeping after working this morning and then becoming "flushed". Has moist rag on forehead.   ROS: Limited due to cognitive/behavioral   Objective: Vital Signs: Blood pressure (!) 156/74, pulse 95, temperature 98 F (36.7 C), temperature source Oral, resp. rate 18, weight 76.5 kg (168 lb 10.4 oz), SpO2 96 %. No results found. No results for input(s): WBC, HGB, HCT, PLT in the last 72 hours. No results for input(s): NA, K, CL, GLUCOSE, BUN, CREATININE, CALCIUM in the last 72 hours.  Invalid input(s): CO CBG (last 3)  No results for input(s): GLUCAP in the last 72 hours.  Wt Readings from Last 3 Encounters:  04/18/17 76.5 kg (168 lb 10.4 oz)  04/06/17 74.5 kg (164 lb 3.9 oz)  02/28/17 79.4 kg (175 lb)    Physical Exam:  BP (!) 156/74 (BP Location: Left Arm)   Pulse 95   Temp 98 F (36.7 C) (Oral)   Resp 18   Wt 76.5 kg (168 lb 10.4 oz)   SpO2 96%   BMI 31.87 kg/m  Constitutional:  sleeping. Vital signs reviewed. HEENT: EOMI, oral membranes moist Cardiovascular: RRR without murmur. No JVD    Respiratory: CTA Bilaterally without wheezes or rales. Normal effort    GI: BS +, non-tender, non-distended    Musculoskeletal: No edema or tenderness in extremities  Neurological:  sleeping Dysphonia Motor: LUE 4/5 proximal to distal LLE: 4/5 hip flexion, 4+/5 knee extension, 4+/5 ADF RUE/RLE: 0/5 proximal to distal  (grossly) Skin: Skin is warm and dry.  Psychiatric: difficult to arouse this morning  Assessment/Plan: 1. 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 Physiatrist is providing close team supervision and 24 hour management of active medical problems listed below. Physiatrist and rehab team continue to assess barriers to discharge/monitor  patient progress toward functional and medical goals.  Function:  Bathing Bathing position   Position: Bed(LB at bed level, UB seated EOB)  Bathing parts Body parts bathed by patient: Right arm, Chest, Abdomen, Right upper leg, Left upper leg Body parts bathed by helper: Left arm, Front perineal area, Buttocks, Right lower leg, Left lower leg, Back  Bathing assist Assist Level: (Mod assist)      Upper Body Dressing/Undressing Upper body dressing   What is the patient wearing?: Pull over shirt/dress     Pull over shirt/dress - Perfomed by patient: Thread/unthread left sleeve, Put head through opening Pull over shirt/dress - Perfomed by helper: Thread/unthread right sleeve, Pull shirt over trunk        Upper body assist Assist Level: (Mod assist)      Lower Body Dressing/Undressing Lower body dressing   What is the patient wearing?: Pants, Socks, Shoes       Pants- Performed by helper: Thread/unthread right pants leg, Thread/unthread left pants leg, Pull pants up/down   Non-skid slipper socks- Performed by helper: Don/doff right sock, Don/doff left sock   Socks - Performed by helper: Don/doff right sock, Don/doff left sock Shoes - Performed by patient: Don/doff left shoe Shoes - Performed by helper: Don/doff right shoe, Fasten right, Fasten left          Lower body assist Assist for lower body dressing: (Total assist)      Toileting Toileting Toileting activity did not occur: No continent bowel/bladder event  Toileting assist     Transfers Chair/bed transfer   Chair/bed transfer method: Lateral scoot Chair/bed transfer assist level: Moderate assist (Pt 50 - 74%/lift or lower) Chair/bed transfer assistive device: Mechanical lift Mechanical lift: Stedy   Locomotion Ambulation Ambulation activity did not occur: Safety/medical concerns   Max distance: 10 Assist level: Maximal assist (Pt 25 - 49%)   Wheelchair Wheelchair activity did not occur:  Safety/medical concerns Type: Manual   Assist Level: Dependent (Pt equals 0%)  Cognition Comprehension Comprehension assist level: Understands basic 75 - 89% of the time/ requires cueing 10 - 24% of the time  Expression Expression assist level: Expresses basic 25 - 49% of the time/requires cueing 50 - 75% of the time. Uses single words/gestures.  Social Interaction Social Interaction assist level: Interacts appropriately 25 - 49% of time - Needs frequent redirection.  Problem Solving Problem solving assist level: Solves basic 50 - 74% of the time/requires cueing 25 - 49% of the time  Memory Memory assist level: Recognizes or recalls 25 - 49% of the time/requires cueing 50 - 75% of the time    Medical Problem List and Plan: 1.  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  DVT Prophylaxis/Anticoagulation: SCDs. Monitor for any signs of DVT 3. Pain Management: Tylenol as needed 4. Mood: Effexor 75 mg daily, Xanax 0.5 mg 3 times a day as needed   Amantadine 100 mg with breakfast and lunch started on 3/9.    - consider increase next week 5. Neuropsych: This patient is not capable of making decisions on his own behalf. 6. Skin/Wound Care: Routine skin checks 7. Fluids/Electrolytes/Nutrition: Routine I&O's   BUN/Cr al trending up, encourage fluids   Labs pending for today 8. Hypertension.    HCTZ 12.5 mg daily. Changed to Hygrotin 25 mg on 3/8, increased to 50 mg on 3/11, increase to 75 on 3/14. Consider another agent given renal issues. Await labs   Avapro 300 mg daily.    Monitor with increased mobility 9. Dysphagia. Dysphagia #1 nectar liquids. Follow-up speech therapy 10. Prediabetes   Continue to monitor   Elevated on 3/11---may need closer monitoring---await blood glucose today 11. Transaminitis   Continue to monitor   Elevated, but stable on 3/11    Labs pending today   Cont to monitor, will consider decreasing statin if not improving 12. Leukocytosis   WBCs 13.1 on 3/11   UA equivocal, Ucx no growth   CXR  stable   Labs pending today   Cont to monitor  LOS (Days) 9 A FACE TO FACE EVALUATION WAS PERFORMED  Meredith Staggers 04/20/2017 10:15 AM

## 2017-04-20 NOTE — Progress Notes (Signed)
Speech Language Pathology Daily Session Note  Patient Details  Name: Ebony Williams MRN: [id number] Date of Birth: March 14, 1950  Today's Date: 04/20/2017 SLP Individual Time: 1400-1430 SLP Individual Time Calculation (min): 30 min  Short Term Goals: Week 2: SLP Short Term Goal 1 (Week 2): Pt will verbally convey her needs and wants to caregivers at the word level in 75% of opportunities with min assist verbal cues. SLP Short Term Goal 2 (Week 2): Pt will name basic, familiar objects for 75% accuracy with min assist verbal cues for word finding.  SLP Short Term Goal 3 (Week 2): Pt will locate items to the right of midline during basic, familiar tasks in 75% of opportunities with min assist verbal cues.  SLP Short Term Goal 4 (Week 2): Pt will consume dys 1 textures and nectar thick liquids with supervision cues for use of swallowing precautions.  SLP Short Term Goal 5 (Week 2): Pt will consume therapeutic trials of thin liquids with supervision verbal cues for use of swallowing precautions.   SLP Short Term Goal 6 (Week 2): Pt will sustain her attention to basic, familiar tasks for 5 minute intervals with min assist verbal cues for redirection.    Skilled Therapeutic Interventions: Skilled treatment session focused on dysphagia and communication goals. SLP received pt in bed and side laying. Pt unable to correct position and SLP along with nursing repositioned pt. When upright, pt with sweating and required Max A encouragement to calm self with use of fan and wet washcloth.  Once pt was ready, SLP facilitated session by providing skilled observation of pt consuming ice chips. Pt with appearance of swift swallow initiation and no overt s/s of aspiration. Pt demonstrates readiness for instrumental swallow study. Will order for Monday 3/18. SLP further facilitated session by providing Mod A semantic cues to provide orientation information. Pt left upright in bed with all needs within reach and husband  present.      Function:  Eating Eating   Modified Consistency Diet: No(Ice chips with SLP) Eating Assist Level: Set up assist for           Cognition Comprehension Comprehension assist level: Understands basic 75 - 89% of the time/ requires cueing 10 - 24% of the time  Expression   Expression assist level: Expresses basic 25 - 49% of the time/requires cueing 50 - 75% of the time. Uses single words/gestures.  Social Interaction Social Interaction assist level: Interacts appropriately 50 - 74% of the time - May be physically or verbally inappropriate.  Problem Solving Problem solving assist level: Solves basic 50 - 74% of the time/requires cueing 25 - 49% of the time  Memory Memory assist level: Recognizes or recalls 25 - 49% of the time/requires cueing 50 - 75% of the time    Pain    Therapy/Group: Individual Therapy  Jamerson Vonbargen 04/20/2017, 3:30 PM

## 2017-04-21 ENCOUNTER — Inpatient Hospital Stay (HOSPITAL_COMMUNITY): Payer: Medicare Other | Admitting: Occupational Therapy

## 2017-04-21 LAB — BASIC METABOLIC PANEL
Anion gap: 13 (ref 5–15)
BUN: 19 mg/dL (ref 6–20)
CALCIUM: 9 mg/dL (ref 8.9–10.3)
CHLORIDE: 95 mmol/L — AB (ref 101–111)
CO2: 25 mmol/L (ref 22–32)
CREATININE: 0.67 mg/dL (ref 0.44–1.00)
Glucose, Bld: 143 mg/dL — ABNORMAL HIGH (ref 65–99)
Potassium: 3.5 mmol/L (ref 3.5–5.1)
SODIUM: 133 mmol/L — AB (ref 135–145)

## 2017-04-21 LAB — GLUCOSE, CAPILLARY
GLUCOSE-CAPILLARY: 145 mg/dL — AB (ref 65–99)
GLUCOSE-CAPILLARY: 150 mg/dL — AB (ref 65–99)
Glucose-Capillary: 144 mg/dL — ABNORMAL HIGH (ref 65–99)

## 2017-04-21 MED ORDER — INSULIN ASPART 100 UNIT/ML ~~LOC~~ SOLN
0.0000 [IU] | Freq: Three times a day (TID) | SUBCUTANEOUS | Status: DC
Start: 2017-04-21 — End: 2017-05-08
  Administered 2017-04-21 – 2017-04-22 (×4): 1 [IU] via SUBCUTANEOUS
  Administered 2017-04-23: 2 [IU] via SUBCUTANEOUS
  Administered 2017-04-23: 1 [IU] via SUBCUTANEOUS
  Administered 2017-04-23: 2 [IU] via SUBCUTANEOUS
  Administered 2017-04-24 (×2): 1 [IU] via SUBCUTANEOUS
  Administered 2017-04-25: 2 [IU] via SUBCUTANEOUS
  Administered 2017-04-25 – 2017-04-26 (×3): 1 [IU] via SUBCUTANEOUS
  Administered 2017-04-26: 3 [IU] via SUBCUTANEOUS
  Administered 2017-04-26 – 2017-04-27 (×2): 1 [IU] via SUBCUTANEOUS
  Administered 2017-04-27: 2 [IU] via SUBCUTANEOUS
  Administered 2017-04-28: 1 [IU] via SUBCUTANEOUS
  Administered 2017-04-28: 2 [IU] via SUBCUTANEOUS
  Administered 2017-04-29 (×2): 1 [IU] via SUBCUTANEOUS
  Administered 2017-04-30: 2 [IU] via SUBCUTANEOUS
  Administered 2017-05-01 – 2017-05-05 (×4): 1 [IU] via SUBCUTANEOUS
  Administered 2017-05-05: 2 [IU] via SUBCUTANEOUS
  Administered 2017-05-06 – 2017-05-07 (×4): 1 [IU] via SUBCUTANEOUS
  Administered 2017-05-07: 2 [IU] via SUBCUTANEOUS

## 2017-04-21 NOTE — Progress Notes (Signed)
Per patient family member and RN patient unable to wear CPAP at this time due to some confusion. RN made aware if anything changes and patient is able to wear to contact RT.

## 2017-04-21 NOTE — Progress Notes (Signed)
[place] PHYSICAL MEDICINE & REHABILITATION     PROGRESS NOTE  Subjective/Complaints:  Patient lying in bed.  States that she slept well last night.  Denies any headache or pain.  ROS: Limited due to cognitive/behavioral   Objective: Vital Signs: Blood pressure (!) 162/72, pulse 77, temperature (!) 97.5 F (36.4 C), temperature source Oral, resp. rate 20, weight 76.5 kg (168 lb 10.4 oz), SpO2 98 %. No results found. Recent Labs    04/20/17 1048  WBC 15.8*  HGB 16.9*  HCT 49.9*  PLT 297   Recent Labs    04/20/17 1048 04/21/17 0628  NA 134* 133*  K 3.7 3.5  CL 97* 95*  GLUCOSE 213* 143*  BUN 24* 19  CREATININE 0.82 0.67  CALCIUM 9.3 9.0   CBG (last 3)  No results for input(s): GLUCAP in the last 72 hours.  Wt Readings from Last 3 Encounters:  04/18/17 76.5 kg (168 lb 10.4 oz)  04/06/17 74.5 kg (164 lb 3.9 oz)  02/28/17 79.4 kg (175 lb)    Physical Exam:  BP (!) 162/72 (BP Location: Left Arm)   Pulse 77   Temp (!) 97.5 F (36.4 C) (Oral)   Resp 20   Wt 76.5 kg (168 lb 10.4 oz)   SpO2 98%   BMI 31.87 kg/m  Constitutional: No distress . Vital signs reviewed. HEENT: EOMI, oral membranes moist Cardiovascular: RRR without murmur. No JVD    Respiratory: CTA Bilaterally without wheezes or rales. Normal effort    GI: BS +, non-tender, non-distended  Musculoskeletal: No edema or tenderness in extremities  Neurological:   Soft voice Motor: LUE 4/5 proximal to distal LLE: 4/5 hip flexion, 4+/5 knee extension, 4+/5 ADF RUE/RLE: 0/5 proximal to distal---extensor tone RUE and RLE Skin: Skin is warm and dry.  Psychiatric: Arouses easily.  Follows basic commands and answers questions  Assessment/Plan: 1. 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 Physiatrist is providing close team supervision and 24 hour management of active  medical problems listed below. Physiatrist and rehab team continue to assess barriers to discharge/monitor patient progress toward functional and medical goals.  Function:  Bathing Bathing position   Position: Bed(LB at bed level, UB seated EOB)  Bathing parts Body parts bathed by patient: Right arm, Chest, Abdomen, Right upper leg, Left upper leg Body parts bathed by helper: Left arm, Front perineal area, Buttocks, Right lower leg, Left lower leg, Back  Bathing assist Assist Level: (Mod assist)      Upper Body Dressing/Undressing Upper body dressing   What is the patient wearing?: Pull over shirt/dress     Pull over shirt/dress - Perfomed by patient: Thread/unthread left sleeve, Put head through opening Pull over shirt/dress - Perfomed by helper: Thread/unthread right sleeve, Pull shirt over trunk        Upper body assist Assist Level: (Mod assist)      Lower Body Dressing/Undressing Lower body dressing   What is the patient wearing?: Pants, Socks, Shoes       Pants- Performed by helper: Thread/unthread right pants leg, Thread/unthread left pants leg, Pull pants up/down   Non-skid slipper socks- Performed by helper: Don/doff right sock, Don/doff left sock   Socks - Performed by helper: Don/doff right sock, Don/doff left sock Shoes - Performed by patient: Don/doff left shoe Shoes - Performed by helper: Don/doff right shoe, Fasten right, Fasten left  Lower body assist Assist for lower body dressing: (Total assist)      Toileting Toileting Toileting activity did not occur: No continent bowel/bladder event   Toileting steps completed by helper: Adjust clothing prior to toileting, Performs perineal hygiene, Adjust clothing after toileting    Toileting assist     Transfers Chair/bed transfer   Chair/bed transfer method: Lateral scoot Chair/bed transfer assist level: Moderate assist (Pt 50 - 74%/lift or lower) Chair/bed transfer assistive device: Mechanical  lift Mechanical lift: Stedy   Locomotion Ambulation Ambulation activity did not occur: Safety/medical concerns   Max distance: 10 Assist level: Maximal assist (Pt 25 - 49%)   Wheelchair Wheelchair activity did not occur: Safety/medical concerns Type: Manual   Assist Level: Dependent (Pt equals 0%)  Cognition Comprehension Comprehension assist level: Understands basic 75 - 89% of the time/ requires cueing 10 - 24% of the time  Expression Expression assist level: Expresses basic 25 - 49% of the time/requires cueing 50 - 75% of the time. Uses single words/gestures.  Social Interaction Social Interaction assist level: Interacts appropriately 50 - 74% of the time - May be physically or verbally inappropriate.  Problem Solving Problem solving assist level: Solves basic 50 - 74% of the time/requires cueing 25 - 49% of the time  Memory Memory assist level: Recognizes or recalls 25 - 49% of the time/requires cueing 50 - 75% of the time    Medical Problem List and Plan: 1.  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  DVT Prophylaxis/Anticoagulation: SCDs. Monitor for any signs of DVT 3. Pain Management: Tylenol as needed 4. Mood: Effexor 75 mg daily, Xanax 0.5 mg 3 times a day as needed   Amantadine 100 mg with breakfast and lunch started on 3/9.    -consider increase next week 5. Neuropsych: This patient is not capable of making decisions on his own behalf. 6. Skin/Wound Care: Routine skin checks 7. Fluids/Electrolytes/Nutrition: Routine I&O's   BUN/Cr improved to 19/.67   Re-check monday 8. Hypertension.    HCTZ 12.5 mg daily. Changed to Hygrotin 25 mg on 3/8, increased to 50 mg on 3/11, increase to 75 on 3/14--reduced to 25mg  given increased BUN/Cr   Avapro 300 mg daily.    Blood pressure reasonable at present 9. Dysphagia. Dysphagia #1 nectar  liquids. Follow-up speech therapy 10. Prediabetes: Continue elevation of blood glucose on labs   Will check CBGs before meals and at bedtime    -Add sliding scale insulin 11. Transaminitis   Continue to monitor   Improving LFTs yesterday  12. Leukocytosis   WBCs 13.1 on 3/11--- up to 15.8 on 3/15   UA equivocal, Ucx no growth   CXR  stable   -No clinical signs of infection   -Recheck tomorrow  LOS (Days) 10 A FACE TO FACE EVALUATION WAS PERFORMED  Meredith Staggers 04/21/2017 8:04 AM

## 2017-04-21 NOTE — Progress Notes (Signed)
Occupational Therapy Session Note  Patient Details  Name: Ebony Williams MRN: [id number] Date of Birth: [birth year]  Today's Date: 04/21/2017 OT Individual Time: 1015-1100 OT Individual Time Calculation (min): 45 min     Skilled Therapeutic Interventions/Progress Updates: patient difficult to arouse and stated, "I do not feel good" and fanned herself a time or two with her hand.    This clinician asked if she was hot, and she replied,"Yes."   An ice pack and wash cloth with a few pieces of ice were offered to the patient, but she did notopen her eyes.   Then she replied, "Put them anywhere on me."    Patient's fan was already facing her, and she moved it closer.  RN assessed vitals, where did not reveal a fever or out the patient's range of BP & O2 was 98.    RN shared into that patient assessed as ok, but to try to encourage her to arouse and participate within her wellness abilities.   Patient stated she was not feeling well neough to get out of bed but would complete LB bathing and dressing bed level.      Patient required constant promptings to participate and bathed and dressed lower body with Mod A bed level, boosting hips and lateral leans.  Patient was left resting in her bed with call bell an dphone in place, - at the end of the session     Therapy Documentation Precautions:  Precautions Precautions: Fall Precaution Comments: right hemiplegia and neglect, extensor tone in the left elbow and in the shoulder Restrictions Weight Bearing Restrictions: No   Pain:denied   Therapy/Group: Individual Therapy  Alfredia Ferguson [facility] 04/21/2017, 6:19 PM

## 2017-04-22 ENCOUNTER — Inpatient Hospital Stay (HOSPITAL_COMMUNITY): Payer: Medicare Other | Admitting: Occupational Therapy

## 2017-04-22 ENCOUNTER — Inpatient Hospital Stay (HOSPITAL_COMMUNITY): Payer: Medicare Other | Admitting: Physical Therapy

## 2017-04-22 LAB — CBC
HCT: 47.7 % — ABNORMAL HIGH (ref 36.0–46.0)
Hemoglobin: 16.7 g/dL — ABNORMAL HIGH (ref 12.0–15.0)
MCH: 31.9 pg (ref 26.0–34.0)
MCHC: 35 g/dL (ref 30.0–36.0)
MCV: 91 fL (ref 78.0–100.0)
PLATELETS: 256 10*3/uL (ref 150–400)
RBC: 5.24 MIL/uL — ABNORMAL HIGH (ref 3.87–5.11)
RDW: 13.8 % (ref 11.5–15.5)
WBC: 12.2 10*3/uL — AB (ref 4.0–10.5)

## 2017-04-22 LAB — GLUCOSE, CAPILLARY
GLUCOSE-CAPILLARY: 127 mg/dL — AB (ref 65–99)
Glucose-Capillary: 150 mg/dL — ABNORMAL HIGH (ref 65–99)
Glucose-Capillary: 101 mg/dL — ABNORMAL HIGH (ref 65–99)
Glucose-Capillary: 214 mg/dL — ABNORMAL HIGH (ref 65–99)

## 2017-04-22 NOTE — Progress Notes (Signed)
Physical Therapy Session Note  Patient Details  Name: Ebony Williams MRN: [id number] Date of Birth: 11/30/1950  Today's Date: 04/22/2017 PT Individual Time: 1350-1419 PT Individual Time Calculation (min): 29 min   Short Term Goals: Week 2:  PT Short Term Goal 1 (Week 2): pt will consistently perform transfers with mod A PT Short Term Goal 2 (Week 2): pt will propel w/c in controlled environment x 25' with min A  Skilled Therapeutic Interventions/Progress Updates:    Pt supine upon PT arrival, agreeable to therapy tx and denies pain. Pt transferred from supine>sitting EOB with mod assist, min assist initially to gain seated balance and then pt able to maintain seated balance with stand by assist while therapist donned socks. Pt performed stand pivot transfer from bed>w/c with mod assist, verbal cues for techniques. Pt performed x 2 sit<>stands from w/c with mod assist, difficulty maintaining balance secondary to increased R LE tone limiting ability to get foot flat on floor despite facilitation. Therapist performed heel cord stretch. Pt transferred back to bed mod assist stand pivot, left supine in bed with needs in reach.  Therapy Documentation Precautions:  Precautions Precautions: Fall Precaution Comments: right hemiplegia and neglect, extensor tone in the left elbow and in the shoulder Restrictions Weight Bearing Restrictions: No   See Function Navigator for Current Functional Status.   Therapy/Group: Individual Therapy  Drema Dallas Schagen,PT, DPT 04/22/2017, 2:16 PM

## 2017-04-22 NOTE — Plan of Care (Signed)
  RH BLADDER ELIMINATION RH STG MANAGE BLADDER WITH ASSISTANCE Description STG Manage Bladder With Mod. Assistance  04/22/2017 2235 - Not Progressing by Gwendolyn Grant, RN Note Urinary incontinence, on timed-toileting

## 2017-04-22 NOTE — Progress Notes (Signed)
[facility] PHYSICAL MEDICINE & REHABILITATION     PROGRESS NOTE  Subjective/Complaints:  Patient in bed.  Denies any new problems or complaints.  States that she was able to sleep last night.  ROS: Limited due to cognitive/behavioral    Objective: Vital Signs: Blood pressure (!) 151/68, pulse 71, temperature 97.6 F (36.4 C), temperature source Oral, resp. rate 20, weight 76.5 kg (168 lb 10.4 oz), SpO2 98 %. No results found. Recent Labs    04/20/17 1048 04/22/17 0425  WBC 15.8* 12.2*  HGB 16.9* 16.7*  HCT 49.9* 47.7*  PLT 297 256   Recent Labs    04/20/17 1048 04/21/17 0628  NA 134* 133*  K 3.7 3.5  CL 97* 95*  GLUCOSE 213* 143*  BUN 24* 19  CREATININE 0.82 0.67  CALCIUM 9.3 9.0   CBG (last 3)  Recent Labs    04/21/17 1629 04/21/17 2103 04/22/17 0634  GLUCAP 150* 144* 150*    Wt Readings from Last 3 Encounters:  04/18/17 76.5 kg (168 lb 10.4 oz)  04/06/17 74.5 kg (164 lb 3.9 oz)  02/28/17 79.4 kg (175 lb)    Physical Exam:  BP (!) 151/68 (BP Location: Left Arm)   Pulse 71   Temp 97.6 F (36.4 C) (Oral)   Resp 20   Wt 76.5 kg (168 lb 10.4 oz)   SpO2 98%   BMI 31.87 kg/m  Constitutional: No distress . Vital signs reviewed. HEENT: EOMI, oral membranes moist Cardiovascular: RRR without murmur. No JVD    Respiratory: CTA Bilaterally without wheezes or rales. Normal effort    GI: BS +, non-tender, non-distended  Musculoskeletal: No edema or tenderness in extremities  Neurological: Alert and follows commands  Soft voice Motor: LUE 4/5 proximal to distal LLE: 4/5 hip flexion, 4+/5 knee extension, 4+/5 ADF RUE/RLE: 0/5 proximal to distal---flexor tone RUE and extensor tone RLE (2/4) Skin: Skin is warm and dry.  Psychiatric: Awake and alert  Assessment/Plan: 1. 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 Physiatrist is providing close team supervision and 24 hour management of active medical problems listed below. Physiatrist and rehab team continue to assess barriers to discharge/monitor patient progress toward functional and medical goals.  Function:  Bathing Bathing position   Position: Bed(LB at bed level, UB seated EOB)  Bathing parts Body parts bathed by patient: Right arm, Chest, Abdomen, Right upper leg, Left upper leg Body parts bathed by helper: Left arm, Front perineal area, Buttocks, Right lower leg, Left lower leg, Back  Bathing assist Assist Level: (Mod assist)      Upper Body Dressing/Undressing Upper body dressing   What is the patient wearing?: Pull over shirt/dress     Pull over shirt/dress - Perfomed by patient: Thread/unthread left sleeve, Put head through opening Pull over shirt/dress - Perfomed by helper: Thread/unthread right sleeve, Pull shirt over trunk        Upper body assist Assist Level: (Mod assist)      Lower Body Dressing/Undressing Lower body dressing   What is the patient wearing?: Pants, Socks, Shoes       Pants- Performed by helper: Thread/unthread right pants leg, Thread/unthread left pants leg, Pull pants up/down   Non-skid slipper socks- Performed by helper: Don/doff right sock, Don/doff left sock   Socks - Performed by helper: Don/doff right sock, Don/doff left sock Shoes - Performed by patient: Don/doff left  shoe Shoes - Performed by helper: Don/doff right shoe, Fasten right, Fasten left          Lower body assist Assist for lower body dressing: (Total assist)      Toileting Toileting Toileting activity did not occur: No continent bowel/bladder event   Toileting steps completed by helper: Adjust clothing prior to toileting, Performs perineal hygiene, Adjust clothing after toileting    Toileting assist     Transfers Chair/bed transfer   Chair/bed transfer method: Lateral scoot Chair/bed transfer assist level:  Moderate assist (Pt 50 - 74%/lift or lower) Chair/bed transfer assistive device: Mechanical lift Mechanical lift: Stedy   Locomotion Ambulation Ambulation activity did not occur: Safety/medical concerns   Max distance: 10 Assist level: Maximal assist (Pt 25 - 49%)   Wheelchair Wheelchair activity did not occur: Safety/medical concerns Type: Manual   Assist Level: Dependent (Pt equals 0%)  Cognition Comprehension Comprehension assist level: Understands basic 75 - 89% of the time/ requires cueing 10 - 24% of the time  Expression Expression assist level: Expresses basic 25 - 49% of the time/requires cueing 50 - 75% of the time. Uses single words/gestures.  Social Interaction Social Interaction assist level: Interacts appropriately 50 - 74% of the time - May be physically or verbally inappropriate.  Problem Solving Problem solving assist level: Solves basic 50 - 74% of the time/requires cueing 25 - 49% of the time  Memory Memory assist level: Recognizes or recalls 25 - 49% of the time/requires cueing 50 - 75% of the time    Medical Problem List and Plan: 1.  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  DVT Prophylaxis/Anticoagulation: SCDs. Monitor for any signs of DVT 3. Pain Management: Tylenol as needed 4. Mood: Effexor 75 mg daily, Xanax 0.5 mg 3 times a day as needed   Amantadine 100 mg with breakfast and lunch started on 3/9.    -consider increase next week 5. Neuropsych: This patient is not capable of making decisions on his own behalf. 6. Skin/Wound Care: Routine skin checks 7. Fluids/Electrolytes/Nutrition: Routine I&O's   BUN/Cr improved to 19/.67   Re-check monday 8. Hypertension.    HCTZ 12.5 mg daily. Changed to Hygrotin 25 mg on 3/8, increased to 50 mg on 3/11, increase to 75 on 3/14--reduced to 25mg  given increased BUN/Cr   Avapro 300  mg daily.    Blood pressure reasonable at present 9. Dysphagia. Dysphagia #1 nectar liquids. Follow-up speech therapy 10. Prediabetes: Continue elevation of blood glucose on labs   Will check CBGs before meals and at bedtime    -Add sliding scale insulin 11. Transaminitis   Continue to monitor   Improving LFTs yesterday  12. Leukocytosis   WBCs 13.1 on 3/11--- up to 15.8 on 3/15---> 12.2 3/17   UA equivocal, Ucx no growth   CXR  stable   -No clinical signs of infection  13. Spasticity:   -increasing tone RUE/RLE   -continue ROM/splinting Given issues with arousal and mental status I am very hesitant to use antispasmodics  LOS (Days) 11 A FACE TO FACE EVALUATION WAS PERFORMED  Meredith Staggers 04/22/2017 7:37 AM

## 2017-04-22 NOTE — Progress Notes (Signed)
Pt refused cpap tonight. Will continue to monitor. No distress noted.

## 2017-04-22 NOTE — Progress Notes (Signed)
Occupational Therapy Session Note  Patient Details  Name: Ebony Williams MRN: [id number] Date of Birth: [birth year]  Today's Date: 04/22/2017 OT Individual Time:  - 1445-1530  (45 min)      Short Term Goals: Week 1:  OT Short Term Goal 1 (Week 1): Pt will complete bathing with mod assist and mod cues for sequencing OT Short Term Goal 1 - Progress (Week 1): Met OT Short Term Goal 2 (Week 1): Pt will complete UB dressing with mod assist OT Short Term Goal 2 - Progress (Week 1): Met OT Short Term Goal 3 (Week 1): Pt will complete toilet transfer with max assist of 1 caregiver OT Short Term Goal 3 - Progress (Week 1): Progressing toward goal OT Short Term Goal 4 (Week 1): Pt will complete LB dressing with max assist of one caregiver OT Short Term Goal 4 - Progress (Week 1): Progressing toward goal Week 2:  OT Short Term Goal 1 (Week 2): Pt will complete toilet transfer with max assist of 1 caregiver OT Short Term Goal 2 (Week 2): Pt will complete LB dressing with max assist of one caregiver OT Short Term Goal 3 (Week 2): Pt will complete bathing with min assist OT Short Term Goal 4 (Week 2): Pt will complete UB dressing with min assist Week 3:     Skilled Therapeutic Interventions/Progress Updates:   Pt lying in bed upon OT arrival.  Pt reported being hot.  Used cold cloth to cool her body.  Pt had already had BM.  Addressed bed mobility, attention to Right side, Right PROM, and positioning, body stretching.  Provided stretching to RUE, trunk and legs.  Did lateral rotations at hips with knees flexed and feet flat on bed.  Pt tolerated well.  Did rolling side to side with mod assist.  Pt was total assist with bowel cleaning and donning brief.  She was unable to recall therapists name but remembered Rudene Anda which was next day before hers'.  She remained in bed at end of session.  She reported she was going to stay in bed with her eyes closed but not sleep.   OT positioned her Right side and left  with all safety items intact.    Therapy Documentation Precautions:  Precautions Precautions: Fall Precaution Comments: right hemiplegia and neglect, extensor tone in the left elbow and in the shoulder Restrictions Weight Bearing Restrictions: No General:   Vital Signs: Therapy Vitals Pulse Rate: 79 Resp: 18 BP: (!) 156/73 Patient Position (if appropriate): Lying Oxygen Therapy SpO2: 96 % O2 Device: Room Air Pain:  General aches 3/10       See Function Navigator for Current Functional Status.   Therapy/Group: Individual Therapy  Lisa Roca 04/22/2017, 3:50 PM

## 2017-04-23 ENCOUNTER — Inpatient Hospital Stay (HOSPITAL_COMMUNITY): Payer: Medicare Other

## 2017-04-23 ENCOUNTER — Inpatient Hospital Stay (HOSPITAL_COMMUNITY): Payer: Medicare Other | Admitting: Occupational Therapy

## 2017-04-23 ENCOUNTER — Inpatient Hospital Stay (HOSPITAL_COMMUNITY): Payer: Medicare Other | Admitting: Speech Pathology

## 2017-04-23 ENCOUNTER — Encounter (HOSPITAL_COMMUNITY): Payer: Medicare Other | Admitting: Speech Pathology

## 2017-04-23 ENCOUNTER — Inpatient Hospital Stay (HOSPITAL_COMMUNITY): Payer: Medicare Other | Admitting: Physical Therapy

## 2017-04-23 LAB — BASIC METABOLIC PANEL
Anion gap: 14 (ref 5–15)
BUN: 19 mg/dL (ref 6–20)
CHLORIDE: 94 mmol/L — AB (ref 101–111)
CO2: 26 mmol/L (ref 22–32)
CREATININE: 0.62 mg/dL (ref 0.44–1.00)
Calcium: 8.9 mg/dL (ref 8.9–10.3)
GFR calc Af Amer: >60 mL/min (ref >60)
GFR calc non Af Amer: >60 mL/min (ref >60)
Glucose, Bld: 146 mg/dL — ABNORMAL HIGH (ref 65–99)
POTASSIUM: 3.2 mmol/L — AB (ref 3.5–5.1)
SODIUM: 134 mmol/L — AB (ref 135–145)

## 2017-04-23 LAB — GLUCOSE, CAPILLARY
GLUCOSE-CAPILLARY: 168 mg/dL — AB (ref 65–99)
GLUCOSE-CAPILLARY: 151 mg/dL — AB (ref 65–99)
GLUCOSE-CAPILLARY: 139 mg/dL — AB (ref 65–99)
Glucose-Capillary: 137 mg/dL — ABNORMAL HIGH (ref 65–99)

## 2017-04-23 MED ORDER — ACETAMINOPHEN 160 MG/5ML PO SOLN: 650.0000 mg | ORAL | Status: DC | PRN

## 2017-04-23 MED ORDER — ACETAMINOPHEN 650 MG RE SUPP: 650.0000 mg | RECTAL | Status: DC | PRN

## 2017-04-23 MED ORDER — ACETAMINOPHEN 325 MG PO TABS: 650.0000 mg | ORAL_TABLET | ORAL | Status: DC | PRN

## 2017-04-23 MED ORDER — POTASSIUM CHLORIDE CRYS ER 20 MEQ PO TBCR
20.0000 meq | EXTENDED_RELEASE_TABLET | Freq: Every day | ORAL | Status: DC
  Administered 2017-04-23 – 2017-04-30 (×8): 20 meq via ORAL
  Filled 2017-04-23 (×9): qty 1

## 2017-04-23 MED ORDER — ALPRAZOLAM 0.25 MG PO TABS: 0.5000 mg | ORAL_TABLET | Freq: Three times a day (TID) | ORAL | Status: DC | PRN

## 2017-04-23 MED ORDER — BACLOFEN 5 MG HALF TABLET
5.0000 mg | ORAL_TABLET | Freq: Two times a day (BID) | ORAL | Status: DC
  Administered 2017-04-23 (×2): 5 mg via ORAL
  Filled 2017-04-23 (×3): qty 1

## 2017-04-23 MED ORDER — PANTOPRAZOLE SODIUM 40 MG PO TBEC: 40.0000 mg | DELAYED_RELEASE_TABLET | Freq: Every day | ORAL | Status: DC

## 2017-04-23 NOTE — Progress Notes (Signed)
Physical Therapy Note  Patient Details  Name: [name] MRN: [id number] Date of Birth: 10/21/1950 Today's Date: 04/23/2017    Time: 1300-1400 60 minutes  1:1 No c/o pain.  Pt performs supine to sit with mod A. Pt then with c/o dizziness, lessens in 2-3 minutes.  BP sitting 128/70. RN aware of ongoing dizziness. Pt then transfers to w/c with max A, max cuing to lean forward and wt shift.  Pt states she likes to "cook" at home. Pt agreeable to bake muffins during therapy.  Hand over hand assist to assist with Rt UE to stabilize bowl and bag when opening package and stirring muffins. Pt requires mod multimodal cues to name objects used in baking task.  Pt then able to stand x 2 minutes with mod A to wash dishes after baking.  Pt then with episode of bowel incontinence. Pt back to bed with mod A squat pivot.  Min A for rolling and total A for hygiene.  Pt left in bed with alarm on, family present.   Kaelob Persky 04/23/2017, 2:09 PM

## 2017-04-23 NOTE — Progress Notes (Signed)
Occupational Therapy Note  Patient Details  Name: Ebony Williams MRN: [id number] Date of Birth: [birth year]  Today's Date: 04/23/2017 OT Individual Time: 1030-1100 OT Individual Time Calculation (min): 30 min   No c/o pain.   Upon arrival to room, pt received in bed.  Pt had just had a BM in her brief. Worked on active rolling L and R with pt using L arm to support her R arm and cued pt to use her head to assist with rolling. Pt rolled with mod A as therapist cleansed and changed pt.  Pt worked on bridging exercises to increase bed mobility skills.  Pt has increased tone in RUE/ RLE.  R heel dorsiflexion very limited so place PRAFO boot on pt.  PROM and tone inhibiting stretches for RUE.  Pt resting in bed with all needs met and bed alarm set.    [place] 04/23/2017, 12:37 PM

## 2017-04-23 NOTE — Progress Notes (Signed)
Modified Barium Swallow Progress Note  Patient Details  Name: Ebony Williams MRN: [id number] Date of Birth: [birth year]  Today's Date: 04/23/2017  Modified Barium Swallow completed.  Full report located under Chart Review in the Imaging Section.  Brief recommendations include the following:  Clinical Impression  Pt presents with continued risk of aspiraiton when consuming thin liquids. During MBS, pt struggled to maintain alertness level as well as neutral positioning in chair. Pt continued to slid down in chair. With Mod A verbal cues, pt able tomaintain alertness enough for continuation of MBS. Pt with swift AP transit of thin liquids but incomplete laryngeal closure resulted on penetration before and during swallow. Penetration was consistent across thin liquids with varying depths of penetration. Occasionally, pt with penetration to cords that didn't clear during swallow and occaisonally penetration cleared with swallow. Additionally, when penetrates were not cleared during the swallow they were clearing visible on anterior laryngeal vestibule and although no aspiration was observed, when fluro was turned back on there were occasions that anterior trachea appeared darker (possibly from anterior residue). Head turn to right was effective in eliminating penetration. However, pt had significant difficulty coordinating sips from cup, holding bolus in mouth and turning head then swallowing. For this reason, recommend upgrading diet to dysphagia 2 with necatr thick liquids and thin liquids with SLP to eastablish motor pattern of head turn. Education provided to nursing, pt and her daughter.    Swallow Evaluation Recommendations       SLP Diet Recommendations: Dysphagia 2 (Fine chop) solids;Nectar thick liquid   Liquid Administration via: Cup   Medication Administration: Whole meds with puree   Supervision: Patient able to self feed   Compensations: Slow rate;Minimize environmental  distractions;Small sips/bites   Postural Changes: Seated upright at 90 degrees            Lawerence Dery 04/23/2017,9:39 AM

## 2017-04-23 NOTE — Progress Notes (Signed)
[facility] PHYSICAL MEDICINE & REHABILITATION     PROGRESS NOTE  Subjective/Complaints:  Up eating breakfast. Daughter at bedside. Slept well. Still not using cpap  ROS: Patient denies fever, rash, sore throat, blurred vision, nausea, vomiting, diarrhea, cough, shortness of breath or chest pain, joint or back pain, headache, or mood change.   Objective: Vital Signs: Blood pressure (!) 163/78, pulse 78, temperature (!) 97.4 F (36.3 C), temperature source Oral, resp. rate 18, weight 76.5 kg (168 lb 10.4 oz), SpO2 94 %. No results found. Recent Labs    04/20/17 1048 04/22/17 0425  WBC 15.8* 12.2*  HGB 16.9* 16.7*  HCT 49.9* 47.7*  PLT 297 256   Recent Labs    04/21/17 0628 04/23/17 0425  NA 133* 134*  K 3.5 3.2*  CL 95* 94*  GLUCOSE 143* 146*  BUN 19 19  CREATININE 0.67 0.62  CALCIUM 9.0 8.9   CBG (last 3)  Recent Labs    04/22/17 1643 04/22/17 2049 04/23/17 0630  GLUCAP 101* 214* 168*    Wt Readings from Last 3 Encounters:  04/18/17 76.5 kg (168 lb 10.4 oz)  04/06/17 74.5 kg (164 lb 3.9 oz)  02/28/17 79.4 kg (175 lb)    Physical Exam:  BP (!) 163/78 (BP Location: Left Arm)   Pulse 78   Temp (!) 97.4 F (36.3 C) (Oral)   Resp 18   Wt 76.5 kg (168 lb 10.4 oz)   SpO2 94%   BMI 31.87 kg/m  Constitutional: No distress . Vital signs reviewed. HEENT: EOMI, oral membranes moist Cardiovascular: RRR without murmur. No JVD    Respiratory: CTA Bilaterally without wheezes or rales. Normal effort    GI: BS +, non-tender, non-distended  Musculoskeletal: No edema or tenderness in extremities  Neurological: Alert and follows commands  Soft voice Motor: LUE 4/5 proximal to distal LLE: 4/5 hip flexion, 4+/5 knee extension, 4+/5 ADF RUE/RLE: 0/5 proximal to distal---flexor tone RUE and extensor tone RLE (2/4) Skin: Skin is warm and dry.  Psychiatric: Awake and alert  Assessment/Plan: 1. Functional deficits secondary to multifocal ischemic left MCA and PCA infarcts  which require 3+ hours per day of interdisciplinary therapy in a comprehensive inpatient rehab setting. Physiatrist is providing close team supervision and 24 hour management of active medical problems listed below. Physiatrist and rehab team continue to assess barriers to discharge/monitor patient progress toward functional and medical goals.  Function:  Bathing Bathing position   Position: Bed(LB at bed level, UB seated EOB)  Bathing parts Body parts bathed by patient: Right arm, Chest, Abdomen, Right upper leg, Left upper leg Body parts bathed by helper: Left arm, Front perineal area, Buttocks, Right lower leg, Left lower leg, Back  Bathing assist Assist Level: (Mod assist)      Upper Body Dressing/Undressing Upper body dressing   What is the patient wearing?: Pull over shirt/dress     Pull over shirt/dress - Perfomed by patient: Thread/unthread left sleeve, Put head through opening Pull over shirt/dress - Perfomed by helper: Thread/unthread right sleeve, Pull shirt over trunk        Upper body assist Assist Level: (Mod assist)      Lower Body Dressing/Undressing Lower body dressing   What is the patient wearing?: Pants, Socks, Shoes       Pants- Performed by helper: Thread/unthread right pants leg, Thread/unthread left pants leg, Pull pants up/down   Non-skid slipper socks- Performed by helper: Don/doff right sock, Don/doff left sock   Socks - Performed  by helper: Don/doff right sock, Don/doff left sock Shoes - Performed by patient: Don/doff left shoe Shoes - Performed by helper: Don/doff right shoe, Fasten right, Fasten left          Lower body assist Assist for lower body dressing: (Total assist)      Toileting Toileting Toileting activity did not occur: No continent bowel/bladder event   Toileting steps completed by helper: Performs perineal hygiene, Adjust clothing prior to toileting, Adjust clothing after toileting    Toileting assist Assist level:  Touching or steadying assistance (Pt.75%)   Transfers Chair/bed transfer   Chair/bed transfer method: Stand pivot Chair/bed transfer assist level: Moderate assist (Pt 50 - 74%/lift or lower) Chair/bed transfer assistive device: Armrests Mechanical lift: Ecologist Ambulation activity did not occur: Safety/medical concerns   Max distance: 10 Assist level: Maximal assist (Pt 25 - 49%)   Wheelchair Wheelchair activity did not occur: Safety/medical concerns Type: Manual   Assist Level: Dependent (Pt equals 0%)  Cognition Comprehension Comprehension assist level: Understands basic 75 - 89% of the time/ requires cueing 10 - 24% of the time  Expression Expression assist level: Expresses basic 25 - 49% of the time/requires cueing 50 - 75% of the time. Uses single words/gestures.  Social Interaction Social Interaction assist level: Interacts appropriately 50 - 74% of the time - May be physically or verbally inappropriate.  Problem Solving Problem solving assist level: Solves basic 50 - 74% of the time/requires cueing 25 - 49% of the time  Memory Memory assist level: Recognizes or recalls 25 - 49% of the time/requires cueing 50 - 75% of the time    Medical Problem List and Plan: 1.  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  DVT Prophylaxis/Anticoagulation: SCDs. Monitor for any signs of DVT 3. Pain Management: Tylenol as needed 4. Mood: Effexor 75 mg daily, Xanax 0.5 mg 3 times a day as needed   Amantadine 100 mg with breakfast and lunch started on 3/9.    -overall arousal is improved. 5. Neuropsych: This patient is not capable of making decisions on his own behalf. 6. Skin/Wound Care: Routine skin checks 7. Fluids/Electrolytes/Nutrition: Routine I&O's   BUN/Cr improved to 19/.62 3/18   -replete potassium  8. Hypertension.     HCTZ 12.5 mg daily. Changed to Hygrotin 25 mg on 3/8, increased to 50 mg on 3/11, increase to 75 on 3/14--reduced to 25mg  given increased BUN/Cr   Avapro 300 mg daily.    Blood pressure reasonable at present 9. Dysphagia. Dysphagia #1 nectar liquids. Follow-up speech therapy   -MBS pending 10. Prediabetes: Continue elevation of blood glucose on labs     CBGs before meals and at bedtime, follow for pattern (elevated in PM yesterday)    -Added sliding scale insulin 11. Transaminitis   Continue to monitor   Improving LFTs yesterday  12. Leukocytosis   WBCs 13.1 on 3/11--- up to 15.8 on 3/15---> 12.2 3/17   UA equivocal, Ucx no growth   CXR  stable   -No clinical signs of infection  13. Spasticity:   -increasing tone RUE/RLE   -continue ROM/splinting   -spoke with patient and daughter, are willing to try low dose baclofen    -begin 5mg  bid    -observe for excessive neuro-sedating s.e.  LOS (Days) 12 A FACE TO FACE  EVALUATION WAS PERFORMED  Meredith Staggers 04/23/2017 8:33 AM

## 2017-04-23 NOTE — Progress Notes (Signed)
Pt declined use of CPAP- pt resting comfortably- no distress

## 2017-04-23 NOTE — Progress Notes (Signed)
Speech Language Pathology Daily Session Note  Patient Details  Name: Ebony Williams MRN: [id number] Date of Birth: 14-Dec-1950  Today's Date: 04/23/2017   Skilled treatment session #1 SLP Individual Time: 0940-1010 SLP Individual Time Calculation (min): 30 min   Skilled treatment session #2 SLP Individual Time : 1100-1200 SLP Individual Time Calculation (min): 60 min  Short Term Goals: Week 2: SLP Short Term Goal 1 (Week 2): Pt will verbally convey her needs and wants to caregivers at the word level in 75% of opportunities with min assist verbal cues. SLP Short Term Goal 2 (Week 2): Pt will name basic, familiar objects for 75% accuracy with min assist verbal cues for word finding.  SLP Short Term Goal 3 (Week 2): Pt will locate items to the right of midline during basic, familiar tasks in 75% of opportunities with min assist verbal cues.  SLP Short Term Goal 4 (Week 2): Pt will consume dys 1 textures and nectar thick liquids with supervision cues for use of swallowing precautions.  SLP Short Term Goal 5 (Week 2): Pt will consume therapeutic trials of thin liquids with supervision verbal cues for use of swallowing precautions.   SLP Short Term Goal 6 (Week 2): Pt will sustain her attention to basic, familiar tasks for 5 minute intervals with min assist verbal cues for redirection.    Skilled Therapeutic Interventions:  Skilled treatment session #1 focused on education with daughter and nursing results and recommendation of MBS. Pt with not able to hold eyes open for > 1 minute. Unknown reason. Daughter with multiple questions and all answered to her satisfaction.   Skilled treatment session #2 focused on dysphagia and communication goals. SLP facilitated session by providing Max A verbal cues (take a sip, hold it, turn turn turn turn your head, swallow). Pt required this level of cues to demonstrate swallow after head turn in 75% of opportunities. Pt with coughing present when she swallow  while she turned her head. Pt with several swallow before head turn (aspiration was silent on MBS this morning). SLP also facilitated session by providing Mod A to sustain attention to task before giving up on task. Pt with attempts to speak at phrase level with~ 25% accuracy and Mod A semantic cues. Pt was left upright in bed with all needs within reach. Continue per current plan of care.        Function:    Cognition Comprehension Comprehension assist level: Understands basic 50 - 74% of the time/ requires cueing 25 - 49% of the time  Expression   Expression assist level: Expresses basic 25 - 49% of the time/requires cueing 50 - 75% of the time. Uses single words/gestures.;Expresses basis less than 25% of the time/requires cueing >75% of the time.  Social Interaction Social Interaction assist level: Interacts appropriately 25 - 49% of time - Needs frequent redirection.  Problem Solving Problem solving assist level: Solves basic 25 - 49% of the time - needs direction more than half the time to initiate, plan or complete simple activities  Memory Memory assist level: Recognizes or recalls 25 - 49% of the time/requires cueing 50 - 75% of the time    Pain    Therapy/Group: Individual Therapy  Priscilla Kirstein 04/23/2017, 10:19 AM

## 2017-04-24 ENCOUNTER — Inpatient Hospital Stay (HOSPITAL_COMMUNITY): Payer: Medicare Other | Admitting: Physical Therapy

## 2017-04-24 ENCOUNTER — Inpatient Hospital Stay (HOSPITAL_COMMUNITY): Payer: Medicare Other | Admitting: Speech Pathology

## 2017-04-24 ENCOUNTER — Inpatient Hospital Stay (HOSPITAL_COMMUNITY): Payer: Medicare Other | Admitting: Occupational Therapy

## 2017-04-24 LAB — GLUCOSE, CAPILLARY
GLUCOSE-CAPILLARY: 139 mg/dL — AB (ref 65–99)
Glucose-Capillary: 143 mg/dL — ABNORMAL HIGH (ref 65–99)
Glucose-Capillary: 134 mg/dL — ABNORMAL HIGH (ref 65–99)
Glucose-Capillary: 136 mg/dL — ABNORMAL HIGH (ref 65–99)

## 2017-04-24 MED ORDER — PANTOPRAZOLE SODIUM 40 MG PO PACK
40.0000 mg | PACK | Freq: Every day | ORAL | Status: DC
  Administered 2017-04-24: 40 mg
  Filled 2017-04-24: qty 20

## 2017-04-24 NOTE — Progress Notes (Signed)
Occupational Therapy Session Note  Patient Details  Name: Ebony Williams MRN: [id number] Date of Birth: 09/14/1950  Today's Date: 04/24/2017 OT Individual Time: 1700-1749 and 1500-1530 OT Individual Time Calculation (min): 17 min and 30 min   Short Term Goals: Week 2:  OT Short Term Goal 1 (Week 2): Pt will complete toilet transfer with max assist of 1 caregiver OT Short Term Goal 2 (Week 2): Pt will complete LB dressing with max assist of one caregiver OT Short Term Goal 3 (Week 2): Pt will complete bathing with min assist OT Short Term Goal 4 (Week 2): Pt will complete UB dressing with min assist  Skilled Therapeutic Interventions/Progress Updates:    1) Treatment session with focus on arousal and participation in treatment session.  Pt asleep upon arrival with minimal interaction even with sternal rub.  Pt not opening eyes during session to any amount of stimulation and pushing both therapist and daughter's hands away when completing sternal rubs to arouse.  Per team and daughter, MD took pt off baclofen to see if arousal improves.  Attempted rolling and repositioning to increase arousal with no success.  Therefore pt missed 28 mins therapy session.  2) Treatment session with focus on visual scanning and attention to stimulus in Rt visual field.  Pt received seated upright in w/c with eyes closed, however able to open eyes to auditory stimulus.  Engaged in visual scanning activity in sitting with focus on scanning to Rt visual field to locate items.  Therapist providing auditory signal "tapping" to increase visual scan to Rt to complete matching activity.  Pt encouraged to remain seated upright to increase arousal and allow for sleep overnight.  Pt left upright in w/c with quick release belt intact and all needs in reach.  Therapy Documentation Precautions:  Precautions Precautions: Fall Precaution Comments: right hemiplegia and neglect, extensor tone in the left elbow and in the  shoulder Restrictions Weight Bearing Restrictions: No General: General OT Amount of Missed Time: 28 Minutes Vital Signs: Therapy Vitals Temp: 98.6 F (37 C) Temp Source: Oral Pulse Rate: (!) 51 Resp: 18 BP: 129/68 Patient Position (if appropriate): Sitting Oxygen Therapy SpO2: 98 % O2 Device: Room Air Pain: Pain Assessment Pain Assessment: No/denies pain  See Function Navigator for Current Functional Status.   Therapy/Group: Individual Therapy  Simonne Come 04/24/2017, 3:46 PM

## 2017-04-24 NOTE — Progress Notes (Addendum)
Physical Therapy Note  Patient Details  Name: Ebony Williams MRN: [id number] Date of Birth: [birth year] Today's Date: 04/24/2017    Time: 830-900 30 minutes  1:1 No signs/symptoms of pain.  Pt asleep upon PT arrival.  Pt opens eyes for 1-2 seconds with sternal rub.  Pt total A to sit edge of bed.  Max A for sitting balance with total A to change shirt due to nightgown being sweaty.  Pt then mod A with rolling to change brief.  Pt able to assist with rolling despite not opening eyes or speaking during session.  Pt missed 45 minutes skilled PT due to being difficult to arouse, RN made aware.   Time: 1400-1419 19 minutes  1:1 Pt with no c/o pain.  Pt able to stay alert 1-2 minutes at a time this session.  Pt mod A for supine to sit, min A sitting balance.  Sit to stand x 2 with max A, total A to don shorts in standing.  Squat pivot transfer with mod A to w/c.  Pt able to wash face and brush hair at sink with set up assistance, increased time.  Pt then unable to arouse for further safe PT activities.  Pt left in w/c with quick release belt donned, needs at hand.  Gedalia Mcmillon 04/24/2017, 9:32 AM

## 2017-04-24 NOTE — Progress Notes (Signed)
Pt not using CPAP for several days- no distress- no machine in room

## 2017-04-24 NOTE — Progress Notes (Signed)
Speech Language Pathology Daily Session Note  Patient Details  Name: Ebony Williams MRN: [id number] Date of Birth: 1950-09-08  Today's Date: 04/24/2017 SLP Individual Time: 1137-1202 SLP Individual Time Calculation (min): 25 min  Short Term Goals: Week 2: SLP Short Term Goal 1 (Week 2): Pt will verbally convey her needs and wants to caregivers at the word level in 75% of opportunities with min assist verbal cues. SLP Short Term Goal 2 (Week 2): Pt will name basic, familiar objects for 75% accuracy with min assist verbal cues for word finding.  SLP Short Term Goal 3 (Week 2): Pt will locate items to the right of midline during basic, familiar tasks in 75% of opportunities with min assist verbal cues.  SLP Short Term Goal 4 (Week 2): Pt will consume dys 1 textures and nectar thick liquids with supervision cues for use of swallowing precautions.  SLP Short Term Goal 5 (Week 2): Pt will consume therapeutic trials of thin liquids with supervision verbal cues for use of swallowing precautions.   SLP Short Term Goal 6 (Week 2): Pt will sustain her attention to basic, familiar tasks for 5 minute intervals with min assist verbal cues for redirection.    Skilled Therapeutic Interventions:  Pt was seen for skilled ST targeting goals for communication and dysphagia.  Pt was asleep upon therapist's arrival but was easily awakened to voice and light touch.  Session was completed sitting at edge of bed to maximize alertness for meaningful participation in therapies.  Pt needed max verbal cues to recall head turn to the right with therapeutic trials of thin liquids; however, she was able to utilize posture with intermittent  min assist verbal cues.  Delayed coughing noted x1 when consuming ~6 oz of thin liquids.  Pt needed max assist semantic and phonemic cues to name family members in photos during structured naming practice.  She responded appropriately to simple questions at the word level but intelligibility  remains limited by decreased vocal intensity.  Pt was left in bed with bed alarm set and husband at bedside.  Continue per current plan of care.    Function:  Eating Eating   Modified Consistency Diet: Yes Eating Assist Level: Supervision or verbal cues           Cognition Comprehension Comprehension assist level: Understands basic 75 - 89% of the time/ requires cueing 10 - 24% of the time  Expression   Expression assist level: Expresses basic 25 - 49% of the time/requires cueing 50 - 75% of the time. Uses single words/gestures.  Social Interaction Social Interaction assist level: Interacts appropriately 50 - 74% of the time - May be physically or verbally inappropriate.  Problem Solving Problem solving assist level: Solves basic 25 - 49% of the time - needs direction more than half the time to initiate, plan or complete simple activities  Memory Memory assist level: Recognizes or recalls 25 - 49% of the time/requires cueing 50 - 75% of the time    Pain Pain Assessment Pain Assessment: No/denies pain  Therapy/Group: Individual Therapy  Salim Forero, Selinda Orion 04/24/2017, 1:09 PM

## 2017-04-24 NOTE — Progress Notes (Signed)
Ebony Williams PHYSICAL MEDICINE & REHABILITATION     PROGRESS NOTE  Subjective/Complaints:  No problems reported over night. Hard to arouse this morning.   ROS: Patient denies fever, rash, sore throat, blurred vision, nausea, vomiting, diarrhea, cough, shortness of breath or chest pain, joint or back pain, headache, or mood change.    Objective: Vital Signs: Blood pressure (!) 168/73, pulse 71, temperature 98.4 F (36.9 C), temperature source Oral, resp. rate 18, height 5\' 1"  (1.549 m), weight 76.5 kg (168 lb 10.4 oz), SpO2 96 %. No results found. Recent Labs    04/22/17 0425  WBC 12.2*  HGB 16.7*  HCT 47.7*  PLT 256   Recent Labs    04/23/17 0425  NA 134*  K 3.2*  CL 94*  GLUCOSE 146*  BUN 19  CREATININE 0.62  CALCIUM 8.9   CBG (last 3)  Recent Labs    04/23/17 1708 04/23/17 2124 04/24/17 0636  GLUCAP 137* 139* 143*    Wt Readings from Last 3 Encounters:  04/18/17 76.5 kg (168 lb 10.4 oz)  04/06/17 74.5 kg (164 lb 3.9 oz)  02/28/17 79.4 kg (175 lb)    Physical Exam:  BP (!) 168/73 (BP Location: Left Arm)   Pulse 71   Temp 98.4 F (36.9 C) (Oral)   Resp 18   Ht 5\' 1"  (1.549 m)   Wt 76.5 kg (168 lb 10.4 oz)   SpO2 96%   BMI 31.87 kg/m  Constitutional: No distress . Vital signs reviewed. HEENT: EOMI, oral membranes moist Cardiovascular: RRR without murmur. No JVD    Respiratory: CTA Bilaterally without wheezes or rales. Normal effort    GI: BS +, non-tender, non-distended  Musculoskeletal: No edema or tenderness in extremities  Neurological: lethargic, opens eyes to tactile and sometimes verbal cueing  Soft voice Motor: LUE 4/5 proximal to distal LLE: 4/5 hip flexion, 4+/5 knee extension, 4+/5 ADF RUE/RLE: 0/5 proximal to distal---flexor tone RUE and extensor tone RLE (1-2/4) Skin: Skin is warm and dry.  Psychiatric: lethargic  Assessment/Plan: 1. 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 Physiatrist is providing close team supervision and 24 hour management of active medical problems listed below. Physiatrist and rehab team continue to assess barriers to discharge/monitor patient progress toward functional and medical goals.  Function:  Bathing Bathing position   Position: Bed(LB at bed level, UB seated EOB)  Bathing parts Body parts bathed by patient: Right arm, Chest, Abdomen, Right upper leg, Left upper leg Body parts bathed by helper: Left arm, Front perineal area, Buttocks, Right lower leg, Left lower leg, Back  Bathing assist Assist Level: (Mod assist)      Upper Body Dressing/Undressing Upper body dressing   What is the patient wearing?: Pull over shirt/dress     Pull over shirt/dress - Perfomed by patient: Thread/unthread left sleeve, Put head through opening Pull over shirt/dress - Perfomed by helper: Thread/unthread right sleeve, Pull shirt over trunk        Upper body assist Assist Level: (Mod assist)      Lower Body Dressing/Undressing Lower body dressing   What is the patient wearing?: Pants, Socks, Shoes       Pants- Performed by helper: Thread/unthread right pants leg, Thread/unthread left pants leg, Pull pants up/down   Non-skid slipper socks- Performed by helper: Don/doff right sock, Don/doff left sock   Socks - Performed by helper:  Don/doff right sock, Don/doff left sock Shoes - Performed by patient: Don/doff left shoe Shoes - Performed by helper: Don/doff right shoe, Fasten right, Fasten left          Lower body assist Assist for lower body dressing: (Total assist)      Toileting Toileting Toileting activity did not occur: No continent bowel/bladder event   Toileting steps completed by helper: Performs perineal hygiene, Adjust clothing prior to toileting, Adjust clothing after toileting    Toileting assist Assist level: Touching or steadying  assistance (Pt.75%)   Transfers Chair/bed transfer   Chair/bed transfer method: Stand pivot Chair/bed transfer assist level: Moderate assist (Pt 50 - 74%/lift or lower) Chair/bed transfer assistive device: Armrests Mechanical lift: Ecologist Ambulation activity did not occur: Safety/medical concerns   Max distance: 10 Assist level: Maximal assist (Pt 25 - 49%)   Wheelchair Wheelchair activity did not occur: Safety/medical concerns Type: Manual   Assist Level: Dependent (Pt equals 0%)  Cognition Comprehension Comprehension assist level: Understands basic 50 - 74% of the time/ requires cueing 25 - 49% of the time  Expression Expression assist level: Expresses basic 25 - 49% of the time/requires cueing 50 - 75% of the time. Uses single words/gestures.  Social Interaction Social Interaction assist level: Interacts appropriately 50 - 74% of the time - May be physically or verbally inappropriate.  Problem Solving Problem solving assist level: Solves basic 25 - 49% of the time - needs direction more than half the time to initiate, plan or complete simple activities  Memory Memory assist level: Recognizes or recalls 25 - 49% of the time/requires cueing 50 - 75% of the time    Medical Problem List and Plan: 1.  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  DVT Prophylaxis/Anticoagulation: SCDs. Monitor for any signs of DVT 3. Pain Management: Tylenol as needed 4. Mood: Effexor 75 mg daily, Xanax 0.5 mg 3 times a day as needed   Amantadine 100 mg with breakfast and lunch started on 3/9.    -overall arousal has been better prior to this morning but still has tended to wax and wane 5. Neuropsych: This patient is not capable of making decisions on his own behalf. 6. Skin/Wound Care: Routine skin checks 7. Fluids/Electrolytes/Nutrition:  Routine I&O's   BUN/Cr improved to 19/.62 3/18   -replete potassium (3.2 on 3/18)  8. Hypertension.    HCTZ 12.5 mg daily. Changed to [place] 25 mg on 3/8, increased to 50 mg on 3/11, increase to 75 on 3/14--reduced to 25mg  given increased BUN/Cr   Avapro 300 mg daily.    Blood pressure reasonable at present 9. Dysphagia. Diet advanced to D2/nectars 10. Prediabetes: Continue elevation of blood glucose on labs     CBGs before meals and at bedtime, follow for pattern (elevated in PM yesterday)    -Added sliding scale insulin for covg 11. Transaminitis   Continue to monitor   Improving LFTs---follow up Wednesday 12. Leukocytosis   WBCs 13.1 on 3/11--- up to 15.8 on 3/15---> 12.2 3/17   UA equivocal, Ucx no growth   CXR  stable   -No clinical signs of infection--recheck Wednesday  13. Spasticity:   -increasing tone RUE/RLE   -continue ROM/splinting   -given sedation, will stop baclofen (only received two 5mg  doses)  LOS (Days) 13 A FACE  TO FACE EVALUATION WAS PERFORMED  Meredith Staggers 04/24/2017 8:50 AM

## 2017-04-25 ENCOUNTER — Inpatient Hospital Stay (HOSPITAL_COMMUNITY): Payer: Medicare Other | Admitting: Speech Pathology

## 2017-04-25 ENCOUNTER — Inpatient Hospital Stay (HOSPITAL_COMMUNITY): Payer: Medicare Other | Admitting: Occupational Therapy

## 2017-04-25 ENCOUNTER — Inpatient Hospital Stay (HOSPITAL_COMMUNITY): Payer: Medicare Other | Admitting: Physical Therapy

## 2017-04-25 DIAGNOSIS — E876 Hypokalemia: Secondary | ICD-10-CM

## 2017-04-25 LAB — COMPREHENSIVE METABOLIC PANEL
ALBUMIN: 3.2 g/dL — AB (ref 3.5–5.0)
ALK PHOS: 82 U/L (ref 38–126)
ALT: 43 U/L (ref 14–54)
ANION GAP: 10 (ref 5–15)
AST: 31 U/L (ref 15–41)
BUN: 17 mg/dL (ref 6–20)
CALCIUM: 9 mg/dL (ref 8.9–10.3)
CHLORIDE: 97 mmol/L — AB (ref 101–111)
CO2: 28 mmol/L (ref 22–32)
Creatinine, Ser: 0.65 mg/dL (ref 0.44–1.00)
GFR calc non Af Amer: >60 mL/min (ref >60)
GLUCOSE: 140 mg/dL — AB (ref 65–99)
Potassium: 3.5 mmol/L (ref 3.5–5.1)
SODIUM: 135 mmol/L (ref 135–145)
Total Bilirubin: 0.9 mg/dL (ref 0.3–1.2)
Total Protein: 6.9 g/dL (ref 6.5–8.1)

## 2017-04-25 LAB — CBC
HCT: 47.4 % — ABNORMAL HIGH (ref 36.0–46.0)
Hemoglobin: 16.3 g/dL — ABNORMAL HIGH (ref 12.0–15.0)
MCH: 31.2 pg (ref 26.0–34.0)
MCHC: 34.4 g/dL (ref 30.0–36.0)
MCV: 90.8 fL (ref 78.0–100.0)
Platelets: 211 10*3/uL (ref 150–400)
RBC: 5.22 MIL/uL — ABNORMAL HIGH (ref 3.87–5.11)
RDW: 13.7 % (ref 11.5–15.5)
WBC: 12.2 10*3/uL — ABNORMAL HIGH (ref 4.0–10.5)

## 2017-04-25 LAB — GLUCOSE, CAPILLARY
GLUCOSE-CAPILLARY: 136 mg/dL — AB (ref 65–99)
GLUCOSE-CAPILLARY: 171 mg/dL — AB (ref 65–99)
Glucose-Capillary: 148 mg/dL — ABNORMAL HIGH (ref 65–99)
Glucose-Capillary: 138 mg/dL — ABNORMAL HIGH (ref 65–99)

## 2017-04-25 MED ORDER — AMANTADINE HCL 100 MG PO CAPS
200.0000 mg | ORAL_CAPSULE | Freq: Two times a day (BID) | ORAL | Status: DC
  Administered 2017-04-25 – 2017-05-08 (×27): 200 mg via ORAL
  Filled 2017-04-25 (×27): qty 2

## 2017-04-25 MED ORDER — METFORMIN HCL 500 MG PO TABS
250.0000 mg | ORAL_TABLET | Freq: Every day | ORAL | Status: DC
  Administered 2017-04-25 – 2017-04-29 (×5): 250 mg via ORAL
  Filled 2017-04-25 (×6): qty 1

## 2017-04-25 MED ORDER — PANTOPRAZOLE SODIUM 40 MG PO PACK
40.0000 mg | PACK | Freq: Every day | ORAL | Status: DC
  Administered 2017-04-25 – 2017-04-28 (×4): 40 mg via ORAL
  Filled 2017-04-25 (×4): qty 20

## 2017-04-25 NOTE — Progress Notes (Signed)
Occupational Therapy Session Note  Patient Details  Name: Ebony Williams MRN: [id number] Date of Birth: [birth year]  Today's Date: 04/25/2017 OT Individual Time: 1761-6073 and 1505-1530 OT Individual Time Calculation (min): 60 min and 25 min   Short Term Goals: Week 2:  OT Short Term Goal 1 (Week 2): Pt will complete toilet transfer with max assist of 1 caregiver OT Short Term Goal 2 (Week 2): Pt will complete LB dressing with max assist of one caregiver OT Short Term Goal 3 (Week 2): Pt will complete bathing with min assist OT Short Term Goal 4 (Week 2): Pt will complete UB dressing with min assist  Skilled Therapeutic Interventions/Progress Updates:    1) Treatment session with focus on ADL retraining with rolling, dynamic sitting balance, and increased initiation.  Pt received supine in bed with daughter present. Pt more alert and engaged in therapy this session.  [name] reports pt incontinent just prior to session.  Engaged in hygiene at bed level with pt able to roll Rt and Lt with min assist and use of bed rails.  Engaged in washing of legs and lower legs seated in bed with therapist positioning RLE into figure 4 position to increase pt success with washing.  Donned pants at bed level in figure 4 position and then rolling to pull pants over hips.  UB bathing and dressing completed seated EOB with pt demonstrating improved sitting balance and initiation with washing RUE, therapist positioning RUE to increase success with bathing.  Pt reports increased lightheadedness while donning shirt.  Returned to supine in bed and left in bed in chair position to increase tolerance to upright position.    2) Treatment session with focus on dynamic sitting balance, visual scanning to Rt, and weightbearing through RUE.  Pt asleep upon arrival but easily aroused.  Engaged in bed mobility to sitting on EOB with min assist.  Engaged in activity incorporating weight bearing and visual scanning as well as  word finding.  Therapist placed pt's RUE into weight bearing position in sitting at EOB and engaged in reaching activity incorporating visual scanning to Rt to locate bean bags and crossing midline to further facilitate weight bearing through RUE while reaching for bean bag.  Incorporated challenge of naming items on each bean bag to address word finding with pt successfully naming 75% of familiar food items.  Pt with no c/o dizziness this session.  Returned to supine in bed with all needs in reach.  Therapy Documentation Precautions:  Precautions Precautions: Fall Precaution Comments: right hemiplegia and neglect, extensor tone in the left elbow and in the shoulder Restrictions Weight Bearing Restrictions: No Pain: Pt with no c/o pain  See Function Navigator for Current Functional Status.   Therapy/Group: Individual Therapy  Simonne Come 04/25/2017, 9:58 AM

## 2017-04-25 NOTE — Progress Notes (Signed)
Speech Language Pathology Daily Session Note  Patient Details  Name: Ebony Williams MRN: [id number] Date of Birth: 1950/07/16  Today's Date: 04/25/2017 SLP Individual Time: 1005-1105 SLP Individual Time Calculation (min): 60 min  Short Term Goals: Week 2: SLP Short Term Goal 1 (Week 2): Pt will verbally convey her needs and wants to caregivers at the word level in 75% of opportunities with min assist verbal cues. SLP Short Term Goal 2 (Week 2): Pt will name basic, familiar objects for 75% accuracy with min assist verbal cues for word finding.  SLP Short Term Goal 3 (Week 2): Pt will locate items to the right of midline during basic, familiar tasks in 75% of opportunities with min assist verbal cues.  SLP Short Term Goal 4 (Week 2): Pt will consume dys 1 textures and nectar thick liquids with supervision cues for use of swallowing precautions.  SLP Short Term Goal 5 (Week 2): Pt will consume therapeutic trials of thin liquids with supervision verbal cues for use of swallowing precautions.   SLP Short Term Goal 6 (Week 2): Pt will sustain her attention to basic, familiar tasks for 5 minute intervals with min assist verbal cues for redirection.    Skilled Therapeutic Interventions:  Pt was seen for skilled ST targeting goals for dysphagia and communication. Pt was sitting up in bed with daughter at bedside, awake, alert, and agreeable to getting out of bed for therapy.  Pt utilized call bell to request assistance for getting out of bed with mod assist multimodal cues.  Pt's spontaneous verbal expression was improved today in comparison to previous therapy sessions and pt was able to accurately name a friend who visited prior to therapist's arrival with min-mod assist verbal cues to recognize and correct verbal errors.  Pt needed mod faded to supervision cues for use of head turn when consuming therapeutic trials of thin liquids.  No overt s/s of aspiration were evident with advanced liquids.   Therefore, recommend implementing the water protocol to continue working towards diet progression.  Pt demonstrated understanding of the parameters of the water protocol in her ability to answer yes/no questions accurately with min assist verbal cues. SLP also facilitated the session with a categorical naming task to address word finding and verbal expression.  Pt needed from mod-max assist semantic, phonemic, and sentence completion cues to complete task.  Pt was returned to room and left in wheelchair with dad and cousin at bedside.  Continue per current plan of care.      Function:  Eating Eating   Modified Consistency Diet: Yes Eating Assist Level: Supervision or verbal cues           Cognition Comprehension Comprehension assist level: Understands basic 90% of the time/cues < 10% of the time  Expression   Expression assist level: Expresses basic 50 - 74% of the time/requires cueing 25 - 49% of the time. Needs to repeat parts of sentences.  Social Interaction Social Interaction assist level: Interacts appropriately 75 - 89% of the time - Needs redirection for appropriate language or to initiate interaction.  Problem Solving Problem solving assist level: Solves basic 50 - 74% of the time/requires cueing 25 - 49% of the time  Memory Memory assist level: Recognizes or recalls 25 - 49% of the time/requires cueing 50 - 75% of the time    Pain Pain Assessment Pain Assessment: No/denies pain  Therapy/Group: Individual Therapy  Ebony Williams, Selinda Orion 04/25/2017, 12:25 PM

## 2017-04-25 NOTE — Progress Notes (Signed)
Physical Therapy Note  Patient Details  Name: Ebony Williams MRN: [id number] Date of Birth: [birth year] Today's Date: 04/25/2017    Time: 1115-1200 45 minutes  1:1 No c/o pain.  Pt fatigued but willing to participate with therapy.  When PT entered room pt's phone rang.  Pt able to engage in phone conversation with friend x 2 minutes before fatigue.  Pt performs squat pivot transfers with mod/max A during session.  Nustep for LE strengthening with bilat LEs x 6 minutes level 3 with use of LE adduction assist.  W/c mobility x 150' with initial min A fading to supervision for hemi technique. Pt requires frequent rest breaks but is able to propel with supervision.  Pt requests to lay down at end of session.  Mod A squat pivot and min A to scoot laterally edge of bed.  Pt mod A sit to supine. Pt left in bed with needs at hand, nursing and family present.   Cheyenne Schumm 04/25/2017, 12:56 PM

## 2017-04-25 NOTE — Discharge Instructions (Signed)
Inpatient Rehab Discharge Instructions  [facility] Discharge date and time: No discharge date for patient encounter.   Activities/Precautions/ Functional Status: Activity: activity as tolerated Diet:  Wound Care: none needed Functional status:  ___ No restrictions     ___ Walk up steps independently ___ 24/7 supervision/assistance   ___ Walk up steps with assistance ___ Intermittent supervision/assistance  ___ Bathe/dress independently ___ Walk with walker     _x__ Bathe/dress with assistance ___ Walk Independently    ___ Shower independently ___ Walk with assistance    ___ Shower with assistance ___ No alcohol     ___ Return to work/school ________  Special Instructions: Continue aspirin 81 mg daily and Plavix 75 mg daily times  STROKE/TIA DISCHARGE INSTRUCTIONS SMOKING Cigarette smoking nearly doubles your risk of having a stroke & is the single most alterable risk factor  If you smoke or have smoked in the last 12 months, you are advised to quit smoking for your health.  Most of the excess cardiovascular risk related to smoking disappears within a year of stopping.  Ask you doctor about anti-smoking medications  [place] Quit Line: 1-800-QUIT NOW  Free Smoking Cessation Classes [phone]  CHOLESTEROL Know your levels; limit fat & cholesterol in your diet  Lipid Panel     Component Value Date/Time   CHOL 159 04/07/2017 0226   TRIG 98 04/07/2017 0226   HDL 32 (L) 04/07/2017 0226   CHOLHDL 5.0 04/07/2017 0226   VLDL 20 04/07/2017 0226   LDLCALC 107 (H) 04/07/2017 0226      Many patients benefit from treatment even if their cholesterol is at goal.  Goal: Total Cholesterol (CHOL) less than 160  Goal:  Triglycerides (TRIG) less than 150  Goal:  HDL greater than 40  Goal:  LDL (LDLCALC) less than 100   BLOOD PRESSURE American Stroke Association blood pressure target is less that 120/80 mm/Hg  Your discharge blood pressure is:  BP: (!) 142/79  Monitor your blood  pressure  Limit your salt and alcohol intake  Many individuals will require more than one medication for high blood pressure  DIABETES (A1c is a blood sugar average for last 3 months) Goal HGBA1c is under 7% (HBGA1c is blood sugar average for last 3 months)  Diabetes:   Lab Results  Component Value Date   HGBA1C 6.4 (H) 04/07/2017     Your HGBA1c can be lowered with medications, healthy diet, and exercise.  Check your blood sugar as directed by your physician  Call your physician if you experience unexplained or low blood sugars.  PHYSICAL ACTIVITY/REHABILITATION Goal is 30 minutes at least 4 days per week  Activity: Increase activity slowly, Therapies: Physical Therapy: Home Health Return to work:   Activity decreases your risk of heart attack and stroke and makes your heart stronger.  It helps control your weight and blood pressure; helps you relax and can improve your mood.  Participate in a regular exercise program.  Talk with your doctor about the best form of exercise for you (dancing, walking, swimming, cycling).  DIET/WEIGHT Goal is to maintain a healthy weight  Your discharge diet is: Fall precautions DIET DYS 2 Room service appropriate? Yes; Fluid consistency: Nectar Thick  liquids Your height is:  Height: 5\' 1"  (154.9 cm) Your current weight is: Weight: 76.5 kg (168 lb 10.4 oz) Your Body Mass Index (BMI) is:  BMI (Calculated): 31.88  Following the type of diet specifically designed for you will help prevent another stroke.  Your goal weight range is:    Your goal Body Mass Index (BMI) is 19-24.  Healthy food habits can help reduce 3 risk factors for stroke:  High cholesterol, hypertension, and excess weight.  RESOURCES Stroke/Support Group:  Call [phone]   STROKE EDUCATION PROVIDED/REVIEWED AND GIVEN TO PATIENT Stroke warning signs and symptoms How to activate emergency medical system (call 911). Medications prescribed at discharge. Need for follow-up  after discharge. Personal risk factors for stroke. Pneumonia vaccine given:  Flu vaccine given:  My questions have been answered, the writing is legible, and I understand these instructions.  I will adhere to these goals & educational materials that have been provided to me after my discharge from the hospital.   1 more week then aspirin alone   My questions have been answered and I understand these instructions. I will adhere to these goals and the provided educational materials after my discharge from the hospital.  Patient/Caregiver Signature _______________________________ Date __________  Clinician Signature _______________________________________ Date __________  Please bring this form and your medication list with you to all your follow-up doctor's appointments.

## 2017-04-25 NOTE — Progress Notes (Signed)
[facility] PHYSICAL MEDICINE & REHABILITATION     PROGRESS NOTE  Subjective/Complaints:  Pt seen lying in bed this AM.  She slept well overnight.  She states she is doing well.    ROS: Denies CP, SOB, N/V/D  Objective: Vital Signs: Blood pressure (!) 142/79, pulse 67, temperature 98 F (36.7 C), temperature source Oral, resp. rate 19, height 5\' 1"  (1.549 m), weight 76.5 kg (168 lb 10.4 oz), SpO2 98 %. No results found. Recent Labs    04/25/17 0459  WBC 12.2*  HGB 16.3*  HCT 47.4*  PLT 211   Recent Labs    04/23/17 0425 04/25/17 0459  NA 134* 135  K 3.2* 3.5  CL 94* 97*  GLUCOSE 146* 140*  BUN 19 17  CREATININE 0.62 0.65  CALCIUM 8.9 9.0   CBG (last 3)  Recent Labs    04/24/17 1643 04/24/17 2154 04/25/17 0640  GLUCAP 136* 139* 136*    Wt Readings from Last 3 Encounters:  04/18/17 76.5 kg (168 lb 10.4 oz)  04/06/17 74.5 kg (164 lb 3.9 oz)  02/28/17 79.4 kg (175 lb)    Physical Exam:  BP (!) 142/79 (BP Location: Left Arm)   Pulse 67   Temp 98 F (36.7 C) (Oral)   Resp 19   Ht 5\' 1"  (1.549 m)   Wt 76.5 kg (168 lb 10.4 oz)   SpO2 98%   BMI 31.87 kg/m   Constitutional: No distress . Vital signs reviewed. HENT: Normocephalic, atraumatic. Eyes: EOMI, No discharge. Cardiovascular: RRR. No JVD    Respiratory: CTA Bilaterally. Normal effort    GI: BS +, non-distended  Musculoskeletal: No edema or tenderness in extremities  Neurological: Lethargic Dysphonia Motor: LUE 4-4+/5 proximal to distal LLE: 4/5 hip flexion, 4+/5 knee extension, 4+/5 ADF RUE/RLE: 0/5 proximal to distal Increase in tone notes Skin: Skin is warm and dry.  Psychiatric: lethargic  Assessment/Plan: 1. 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 Physiatrist is providing close team supervision and 24 hour management of active medical problems  listed below. Physiatrist and rehab team continue to assess barriers to discharge/monitor patient progress toward functional and medical goals.  Function:  Bathing Bathing position   Position: Bed(LB at bed level, seated EOB for UB)  Bathing parts Body parts bathed by patient: Right arm, Chest, Abdomen, Right upper leg, Left upper leg, Right lower leg, Left lower leg Body parts bathed by helper: Left arm, Front perineal area, Buttocks  Bathing assist Assist Level: (Mod assist)      Upper Body Dressing/Undressing Upper body dressing   What is the patient wearing?: Pull over shirt/dress     Pull over shirt/dress - Perfomed by patient: Thread/unthread left sleeve, Put head through opening Pull over shirt/dress - Perfomed by helper: Thread/unthread right sleeve, Pull shirt over trunk        Upper body assist Assist Level: (Mod assist)      Lower Body Dressing/Undressing Lower body dressing   What is the patient wearing?: Pants       Pants- Performed by helper: Thread/unthread right pants leg, Thread/unthread left pants leg, Pull pants up/down   Non-skid slipper socks- Performed by helper: Don/doff right sock, Don/doff left sock   Socks - Performed by helper: Don/doff right sock, Don/doff left sock Shoes - Performed by patient: Don/doff left shoe Shoes - Performed by helper: Don/doff right shoe, Fasten right,  Fasten left          Lower body assist Assist for lower body dressing: (Total assist)      Toileting Toileting Toileting activity did not occur: No continent bowel/bladder event   Toileting steps completed by helper: Performs perineal hygiene, Adjust clothing prior to toileting, Adjust clothing after toileting    Toileting assist Assist level: Touching or steadying assistance (Pt.75%)   Transfers Chair/bed transfer   Chair/bed transfer method: Stand pivot Chair/bed transfer assist level: Moderate assist (Pt 50 - 74%/lift or lower) Chair/bed transfer assistive  device: Armrests Mechanical lift: Ecologist Ambulation activity did not occur: Safety/medical concerns   Max distance: 10 Assist level: Maximal assist (Pt 25 - 49%)   Wheelchair Wheelchair activity did not occur: Safety/medical concerns Type: Manual   Assist Level: Dependent (Pt equals 0%)  Cognition Comprehension Comprehension assist level: Understands basic 75 - 89% of the time/ requires cueing 10 - 24% of the time  Expression Expression assist level: Expresses basic 25 - 49% of the time/requires cueing 50 - 75% of the time. Uses single words/gestures.  Social Interaction Social Interaction assist level: Interacts appropriately 50 - 74% of the time - May be physically or verbally inappropriate.  Problem Solving Problem solving assist level: Solves basic 25 - 49% of the time - needs direction more than half the time to initiate, plan or complete simple activities  Memory Memory assist level: Recognizes or recalls 25 - 49% of the time/requires cueing 50 - 75% of the time    Medical Problem List and Plan: 1.  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  DVT Prophylaxis/Anticoagulation: SCDs. Monitor for any signs of DVT 3. Pain Management: Tylenol as needed 4. Mood: Effexor 75 mg daily, Xanax 0.5 mg 3 times a day as needed   Amantadine 100 mg with breakfast and lunch started on 3/9, increased to 200 on 3/20.  5. Neuropsych: This patient is not capable of making decisions on his own behalf. 6. Skin/Wound Care: Routine skin checks 7. Fluids/Electrolytes/Nutrition: Routine I&O's 8. Hypertension.    HCTZ 12.5 mg daily. Changed to [place] 25 mg on 3/8 given increase in BUN/CR   Avapro 300 mg daily.    Labile on 3/20 9. Dysphagia. Diet advanced to D2/nectars 10. Prediabetes:    CBGs before meals and at bedtime    Added sliding  scale insulin for covg   Metformin 250 started on 3/20 11. Transaminitis: Resolved   Continue to monitor 12. Leukocytosis   WBCs 12.2 on 3/20   UA equivocal, Ucx no growth   CXR  Stable   Afebrile  13. Spasticity:   increasing tone RUE/RLE   continue ROM/splinting   given sedation, stopped baclofen  14. Hypokalemia   K+ 3.5  On 3/20   Cont daily supplementation  LOS (Days) 14 A FACE TO FACE EVALUATION WAS PERFORMED  Ebony Williams [name] 04/25/2017 10:01 AM

## 2017-04-26 ENCOUNTER — Inpatient Hospital Stay (HOSPITAL_COMMUNITY): Payer: Medicare Other | Admitting: Occupational Therapy

## 2017-04-26 ENCOUNTER — Inpatient Hospital Stay (HOSPITAL_COMMUNITY): Payer: Medicare Other | Admitting: Physical Therapy

## 2017-04-26 ENCOUNTER — Inpatient Hospital Stay (HOSPITAL_COMMUNITY): Payer: Medicare Other | Admitting: Speech Pathology

## 2017-04-26 LAB — GLUCOSE, CAPILLARY
GLUCOSE-CAPILLARY: 204 mg/dL — AB (ref 65–99)
GLUCOSE-CAPILLARY: 149 mg/dL — AB (ref 65–99)
Glucose-Capillary: 148 mg/dL — ABNORMAL HIGH (ref 65–99)

## 2017-04-26 MED ORDER — AMLODIPINE BESYLATE 5 MG PO TABS
5.0000 mg | ORAL_TABLET | Freq: Every day | ORAL | Status: DC
  Administered 2017-04-26 – 2017-05-01 (×6): 5 mg via ORAL
  Filled 2017-04-26 (×7): qty 1

## 2017-04-26 NOTE — Progress Notes (Signed)
Occupational Therapy Session Note  Patient Details  Name: Ebony Williams MRN: [id number] Date of Birth: 1950-05-26  Today's Date: 04/26/2017 OT Individual Time: 5681-2751 OT Individual Time Calculation (min): 60 min    Short Term Goals: Week 2:  OT Short Term Goal 1 (Week 2): Pt will complete toilet transfer with max assist of 1 caregiver OT Short Term Goal 2 (Week 2): Pt will complete LB dressing with max assist of one caregiver OT Short Term Goal 3 (Week 2): Pt will complete bathing with min assist OT Short Term Goal 4 (Week 2): Pt will complete UB dressing with min assist  Skilled Therapeutic Interventions/Progress Updates:    Treatment session with focus on self-care retraining with sit > stand, transfers, and visual attention to Rt.  Pt received supine in bed with daughter present, reporting incontinent of bowel.  Engaged in hygiene at bed level with rolling at min assist with minimal output.  Pt began straining again and when asked if she felt urge to have BM pt nodded "yes".  With encouragement, pt willing to attempt to have BM on BSC.  Utilized [place] due to urgency with pt able to complete sit > stand into [place] with min assist.  Therapist provided total assist for clothing management and hygiene post toileting.  Returned to EOB to complete bathing and dressing.  Max cues for hemi-technique with dressing.  Incorporated figure 4 positioning to allow pt increased participation in LB dressing with pt able to thread Lt pant leg with assist to initiate task.  Sit > stand from EOB with min assist to pull pants over hips (therapist completing clothing management).  Mod assist squat pivot transfer to Lt to recliner.  Encouraged pt to sit up in recliner to increase OOB tolerance.  Therapy Documentation Precautions:  Precautions Precautions: Fall Precaution Comments: right hemiplegia and neglect, extensor tone in the left elbow and in the shoulder Restrictions Weight Bearing Restrictions:  No Pain: Pain Assessment Pain Scale: 0-10 Pain Score: 0-No pain  See Function Navigator for Current Functional Status.   Therapy/Group: Individual Therapy  Simonne Come 04/26/2017, 10:16 AM

## 2017-04-26 NOTE — Patient Care Conference (Cosign Needed)
Inpatient RehabilitationTeam Conference and Plan of Care Update Date: 04/25/2017   Time: 2:45 PM    Patient Name: Ebony Williams [facility]      Medical Record Number: [id number]  Date of Birth: [birth year] Sex: Female         Room/Bed: 4M05C/4M05C-01 Payor Info: Payor: [employer] MEDICARE / Plan: [employer] MEDICARE / Product Type: *No Product type* /    Admitting Diagnosis: CVA  Admit Date/Time:  04/11/2017  4:33 PM Admission Comments: No comment available   Primary Diagnosis:  <principal problem not specified> Principal Problem: <principal problem not specified>  Patient Active Problem List   Diagnosis Date Noted  . Hypokalemia   . Elevated BUN   . Lethargy   . Transaminitis   . Essential hypertension   . Right hemiplegia ([place])   . Hypertensive crisis   . 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 Depression   . Dysphagia, post-stroke   . Diastolic dysfunction   . Benign essential HTN   . Prediabetes   . Leukocytosis   . Cerebral infarction due to occlusion of left middle cerebral artery ([place]) s/p IV tPA 04/06/2017  . Stroke (cerebrum) ([place]) 04/06/2017  . 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 Pain in joint, shoulder region 10/18/2011    Expected Discharge Date: Expected Discharge Date: (SNF)  Team Members Present: Physician leading conference: Dr. 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 on 04/06/17  Improve mobility, lethargy, HTN, spasticity, hypokalemia  See above   Bowel/Bladder   incontinent of b/b, LBM 3/20, has soft bm's q 2-3 hours  continent with timed toileting with mod A  timed toileting q 2-3 hours while awake,  monitor b/b q shift and prn, address frequent bm's   Swallow/Nutrition/ Hydration   Dys 2, nectar thick liquids; on the water protocol as of 3/20 to continue working towards progression   supervision   education and carryover of safe swallowing strategies, toleration of advanced solids and liquids   ADL's   Mod assist bathing and UB dressing, total assist LB bathing/dressing, max assist transfers  Min assist overall  arousal, activity tolerance, ADL retraining, transfers, RUE NMR   Mobility   max/total A transfers  min A   activity tolerance, NMR, balance, transfers   Communication   mod-max assist, improved spontaneous verbal expression    min assist   continue to address verbal expression, naming, increased vocal intensity    Safety/Cognition/ Behavioral Observations  mod-max assist, improving alertness  min assist   attention, visual scanning to the right of midline   Pain   pt s s/s of pain, denies pain  pain <2  monitor pain q shift and prn   Skin   masd resolved, skin tear Left forearm  no breakdown while on IPR, no infection  assess q shift and prn    Rehab Goals Patient on target to meet rehab goals: Yes Rehab Goals Revised: none *See Care Plan and progress notes for long and short-term goals.     Barriers to Discharge  Current Status/Progress Possible Resolutions Date Resolved   Physician    Medical stability     See above  Therapies,  adjust neurostimulant, supplement K+      Nursing                  PT                    OT                  SLP Decreased caregiver support              SW                Discharge Planning/Teaching Needs:  CSW met with pt's dtr and husband after conference to discuss pt's care post CIR.  They are feeling pt needs more therapy before trying to go home so that she has the best chance of regaining her independence.  Pt's dtr has begun education, but husband will also need to go through training if he will be a caregiver.  If pt  goes SNF, then education to occur at that venue.   Team Discussion:  Pt started and then quickly stopped medication for spasticity, as pt did not respond favorably.  Pt is receiving potassium supplement and neurostimulant increased to help with lethargy.  Pt is oriented to self, is incontinent, and will not keep CPAP mask on at night.  Pt fluctuates with therapists due to lethargy/alertness.  She can participate when awake and does well, but hard to keep her awake/engaged for an entire hour session.  Pt did well with ST today and they are trying trials of thin liquids with head turn to the right.  Pt's diet increased to D2.  MBS did not go well due to lethargy; will attempt again once appropriate.   Revisions to Treatment Plan:  none    Continued Need for Acute Rehabilitation Level of Care: The patient requires daily medical management by a physician with specialized training in physical medicine and rehabilitation for the following conditions: Daily direction of a multidisciplinary physical rehabilitation program to ensure safe treatment while eliciting the highest outcome that is of practical value to the patient.: Yes Daily medical management of patient stability for increased activity during participation in an intensive rehabilitation regime.: Yes Daily analysis of laboratory values and/or radiology reports with any subsequent need for medication adjustment of medical intervention for : Neurological problems;Blood pressure problems;Other  Prevatt, [name] 04/26/2017, 11:47 PM

## 2017-04-26 NOTE — Progress Notes (Signed)
[place] PHYSICAL MEDICINE & REHABILITATION     PROGRESS NOTE  Subjective/Complaints:  Patient seen lying in bed this morning. She states she slept well overnight.   ROS: Denies CP, SOB, N/V/D  Objective: Vital Signs: Blood pressure (!) 151/76, pulse 68, temperature 98.5 F (36.9 C), temperature source Oral, resp. rate 16, height 5\' 1"  (1.549 m), weight 76.5 kg (168 lb 10.4 oz), SpO2 95 %. No results found. Recent Labs    04/25/17 0459  WBC 12.2*  HGB 16.3*  HCT 47.4*  PLT 211   Recent Labs    04/25/17 0459  NA 135  K 3.5  CL 97*  GLUCOSE 140*  BUN 17  CREATININE 0.65  CALCIUM 9.0   CBG (last 3)  Recent Labs    04/25/17 1645 04/25/17 2131 04/26/17 0610  GLUCAP 138* 171* 148*    Wt Readings from Last 3 Encounters:  04/18/17 76.5 kg (168 lb 10.4 oz)  04/06/17 74.5 kg (164 lb 3.9 oz)  02/28/17 79.4 kg (175 lb)    Physical Exam:  BP (!) 151/76 (BP Location: Left Arm)   Pulse 68   Temp 98.5 F (36.9 C) (Oral)   Resp 16   Ht 5\' 1"  (1.549 m)   Wt 76.5 kg (168 lb 10.4 oz)   SpO2 95%   BMI 31.87 kg/m   Constitutional: No distress . Vital signs reviewed. HENT: Normocephalic, atraumatic. Eyes: EOMI, No discharge. Cardiovascular: RRR. No JVD    Respiratory: CTA Bilaterally. Normal effort    GI: BS +, non-distended  Musculoskeletal: No edema or tenderness in extremities  Neurological: Lethargic Dysphonia Motor: LUE 4-4+/5 proximal to distal LLE: 4/5 hip flexion, 4+/5 knee extension, 4+/5 ADF RUE/RLE: 0/5 proximal to distal (unchanged) Increase in tone notes Skin: Skin is warm and dry.  Psychiatric: lethargic  Assessment/Plan: 1. 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 Physiatrist is providing close team supervision and 24 hour management of active medical problems listed below. Physiatrist and rehab team  continue to assess barriers to discharge/monitor patient progress toward functional and medical goals.  Function:  Bathing Bathing position   Position: Bed(LB at bed level, seated EOB for UB)  Bathing parts Body parts bathed by patient: Right arm, Chest, Abdomen, Right upper leg, Left upper leg, Right lower leg, Left lower leg Body parts bathed by helper: Left arm, Front perineal area, Buttocks  Bathing assist Assist Level: (Mod assist)      Upper Body Dressing/Undressing Upper body dressing   What is the patient wearing?: Pull over shirt/dress     Pull over shirt/dress - Perfomed by patient: Thread/unthread left sleeve, Put head through opening Pull over shirt/dress - Perfomed by helper: Thread/unthread right sleeve, Pull shirt over trunk        Upper body assist Assist Level: (Mod assist)      Lower Body Dressing/Undressing Lower body dressing   What is the patient wearing?: Pants       Pants- Performed by helper: Thread/unthread right pants leg, Thread/unthread left pants leg, Pull pants up/down   Non-skid slipper socks- Performed by helper: Don/doff right sock, Don/doff left sock   Socks - Performed by helper: Don/doff right sock, Don/doff left sock Shoes - Performed by patient: Don/doff left shoe Shoes - Performed by helper: Don/doff right shoe, Fasten right, Fasten left          Lower body assist Assist  for lower body dressing: (Total assist)      Toileting Toileting Toileting activity did not occur: No continent bowel/bladder event   Toileting steps completed by helper: Performs perineal hygiene, Adjust clothing prior to toileting, Adjust clothing after toileting    Toileting assist Assist level: Touching or steadying assistance (Pt.75%)   Transfers Chair/bed transfer   Chair/bed transfer method: Stand pivot Chair/bed transfer assist level: Moderate assist (Pt 50 - 74%/lift or lower) Chair/bed transfer assistive device: Armrests Mechanical lift: Press photographer Ambulation activity did not occur: Safety/medical concerns   Max distance: 10 Assist level: Maximal assist (Pt 25 - 49%)   Wheelchair Wheelchair activity did not occur: Safety/medical concerns Type: Manual   Assist Level: Dependent (Pt equals 0%)  Cognition Comprehension Comprehension assist level: Understands basic 50 - 74% of the time/ requires cueing 25 - 49% of the time  Expression Expression assist level: Expresses basic 25 - 49% of the time/requires cueing 50 - 75% of the time. Uses single words/gestures.  Social Interaction Social Interaction assist level: Interacts appropriately 50 - 74% of the time - May be physically or verbally inappropriate.  Problem Solving Problem solving assist level: Solves basic 25 - 49% of the time - needs direction more than half the time to initiate, plan or complete simple activities  Memory Memory assist level: Recognizes or recalls 25 - 49% of the time/requires cueing 50 - 75% of the time    Medical Problem List and Plan: 1.  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  DVT Prophylaxis/Anticoagulation: SCDs. Monitor for any signs of DVT 3. Pain Management: Tylenol as needed 4. Mood: Effexor 75 mg daily, Xanax 0.5 mg 3 times a day as needed   Amantadine 100 mg with breakfast and lunch started on 3/9, increased to 200 on 3/20.  5. Neuropsych: This patient is not capable of making decisions on his own behalf. 6. Skin/Wound Care: Routine skin checks 7. Fluids/Electrolytes/Nutrition: Routine I&O's 8. Hypertension.    HCTZ 12.5 mg daily. Changed to [place] 25 mg on 3/8 given increase in BUN/CR   Avapro 300 mg daily.    Norvasc 5 mg started on 3/21 9. Dysphagia. Diet advanced to D2/nectars 10. Prediabetes:    CBGs before meals and at bedtime    Added sliding scale insulin for covg    Metformin 250 started on 3/20   Remains elevated, will consider further increase tomorrow 11. Transaminitis: Resolved   Continue to monitor 12. Leukocytosis   WBCs 12.2 on 3/20   UA equivocal, Ucx no growth   CXR  Stable   Afebrile  13. Spasticity:   increasing tone RUE/RLE   continue ROM/splinting   given sedation, stopped baclofen  14. Hypokalemia   K+ 3.5  On 3/20   Cont daily supplementation  LOS (Days) 15 A FACE TO FACE EVALUATION WAS PERFORMED  Ankit [name] 04/26/2017 9:07 AM

## 2017-04-26 NOTE — Plan of Care (Signed)
  Problem: RH BOWEL ELIMINATION Goal: RH STG MANAGE BOWEL WITH ASSISTANCE Description STG Manage Bowel with Mod. Assistance.  Outcome: Progressing Goal: RH STG MANAGE BOWEL W/MEDICATION W/ASSISTANCE Description STG Manage Bowel with .Medication with mod Assistance.   Outcome: Progressing   Problem: RH BLADDER ELIMINATION Goal: RH STG MANAGE BLADDER WITH ASSISTANCE Description STG Manage Bladder With Mod. Assistance  Outcome: Progressing   Problem: RH SKIN INTEGRITY Goal: RH STG SKIN FREE OF INFECTION/BREAKDOWN Description With Mod. assist  Outcome: Progressing Goal: RH STG MAINTAIN SKIN INTEGRITY WITH ASSISTANCE Description STG Maintain Skin Integrity With Mod. Assistance.  Outcome: Progressing   Problem: RH SAFETY Goal: RH STG ADHERE TO SAFETY PRECAUTIONS W/ASSISTANCE/DEVICE Description STG Adhere to Safety Precautions With Mod.Assistance/Device.  Outcome: Progressing Goal: RH STG DECREASED RISK OF FALL WITH ASSISTANCE Description STG Decreased Risk of Fall With Mod.Assistance.  Outcome: Progressing   Problem: RH PAIN MANAGEMENT Goal: RH STG PAIN MANAGED AT OR BELOW PT'S PAIN GOAL Description Less than 3,on 1 to 10 scale  Outcome: Progressing   Problem: RH KNOWLEDGE DEFICIT Goal: RH STG INCREASE KNOWLEDGE OF HYPERTENSION Description Family will be able to verbalize the provided material about hypertension with min assist  Outcome: Progressing Goal: RH STG INCREASE KNOWLEDGE OF DYSPHAGIA/FLUID INTAKE Description Family will be able to verbalized the protocol for dysphagia diet and the precautions with min assist/cues  Outcome: Progressing   Problem: RH Vision Goal: RH LTG Vision (Specify) Outcome: Progressing

## 2017-04-26 NOTE — Progress Notes (Signed)
Physical Therapy Session Note  Patient Details  Name: Ebony Williams MRN: [id number] Date of Birth: 12/11/1950  Today's Date: 04/26/2017 PT Individual Time: 1300-1415 PT Individual Time Calculation (min): 75 min   Short Term Goals: Week 2:  PT Short Term Goal 1 (Week 2): pt will consistently perform transfers with mod A PT Short Term Goal 2 (Week 2): pt will propel w/c in controlled environment x 25' with min A  Skilled Therapeutic Interventions/Progress Updates:   Pt received supine in bed and agreeable to PT. Supine>sit transfer with min assist and moderate cues for sequencing.   Squat pivot transfer to WC with mod assist. Moderate cues from PT for proper set up and sequencing. Pt noted to perform transfer impulsively.   Visual scanning task to force improved attention to the R while eating. Pt positioned utensils, drink and apple sauce on the R to force pt to perform cross body reach with 50-75% of eat tasks. Pt transported to rehab gym in WC.   Squat pivot to mat table to the R from WC with mod assist and max cues for set up and sequencing.  Standing balance/tolerance with prolonged stretch of heel cord on the R in semi tandem stance; L UE support on HW. Min-Mod assist to trasnfer into standing from elevated mat table.  3 x 1 min each followed by lateral weight 3x 10 Bil. Mod-max assist from PT to maintain balance with mod assist to facilitate neutral positioning of the RLE in stance to allow Heel cord stretch and improve WB through foot.   WC mobility with Hemi technique using only the LLE. max cues for awareness of obstacles on the L.   Gait training on rail in hall 16ft x2 with max assist from PT and max cues for gait pattern and sequencing. PT required to facilitate all movement in the LLE with noted intermittent partial activation of knee extension in stance as well as sligh hip flexion in attempt to advance the RLE.    Pt returned to room and performed sqaut transfer to bed  with mod assist. Sit>supine completed with min assist, and left supine in bed with call bell in reach and all needs met.         Therapy Documentation Precautions:  Precautions Precautions: Fall Precaution Comments: right hemiplegia and neglect, extensor tone in the left elbow and in the shoulder Restrictions Weight Bearing Restrictions: No Vital Signs: Therapy Vitals Temp: 98.7 F (37.1 C) Temp Source: Oral Pulse Rate: 93 Resp: 18 BP: 133/72 Patient Position (if appropriate): Lying Oxygen Therapy SpO2: 99 % O2 Device: Room Air Pain:   denies.   See Function Navigator for Current Functional Status.   Therapy/Group: Individual Therapy  Austin E Tucker 04/26/2017, 2:17 PM  

## 2017-04-26 NOTE — Progress Notes (Signed)
Social Work Patient ID: Ebony Williams, female   DOB: [birth year], 67 y.o.   MRN: [id number]   CSW met with pt, pt's husband, and pt's dtr to update them on team conference discussion and to talk about d/c plan.  Family is leaning toward SNF tx prior to pt coming home.  CSW provided SNF list and private duty list so that they will be prepared even after SNF.  Pt seems to be onboard with SNF tx, but was tired at the end of our discussion, so CSW suggested family talk with her again over the weekend and give it time to sink in with her and we will discuss again at the beginning of the week.  All agreed with this plan.  CSW to f/u with pt/family on Monday and assist as needed.

## 2017-04-26 NOTE — Progress Notes (Signed)
Pt refused cpap at 0010

## 2017-04-26 NOTE — Plan of Care (Signed)
  Problem: RH BOWEL ELIMINATION Goal: RH STG MANAGE BOWEL WITH ASSISTANCE Description STG Manage Bowel with Mod. Assistance.  Outcome: Not Progressing Goal: RH STG MANAGE BOWEL W/MEDICATION W/ASSISTANCE Description STG Manage Bowel with .Medication with mod Assistance.   Outcome: Not Progressing   Problem: RH BLADDER ELIMINATION Goal: RH STG MANAGE BLADDER WITH ASSISTANCE Description STG Manage Bladder With Mod. Assistance  Outcome: Not Progressing   Problem: RH SKIN INTEGRITY Goal: RH STG SKIN FREE OF INFECTION/BREAKDOWN Description With Mod. assist  Outcome: Not Progressing Goal: RH STG MAINTAIN SKIN INTEGRITY WITH ASSISTANCE Description STG Maintain Skin Integrity With Mod. Assistance.  Outcome: Not Progressing   Problem: RH SAFETY Goal: RH STG ADHERE TO SAFETY PRECAUTIONS W/ASSISTANCE/DEVICE Description STG Adhere to Safety Precautions With Mod.Assistance/Device.  Outcome: Not Progressing Goal: RH STG DECREASED RISK OF FALL WITH ASSISTANCE Description STG Decreased Risk of Fall With Mod.Assistance.  Outcome: Not Progressing   Problem: RH PAIN MANAGEMENT Goal: RH STG PAIN MANAGED AT OR BELOW PT'S PAIN GOAL Description Less than 3,on 1 to 10 scale  Outcome: Not Progressing   Problem: RH KNOWLEDGE DEFICIT Goal: RH STG INCREASE KNOWLEDGE OF HYPERTENSION Description Family will be able to verbalize the provided material about hypertension with min assist  Outcome: Not Progressing Goal: RH STG INCREASE KNOWLEDGE OF DYSPHAGIA/FLUID INTAKE Description Family will be able to verbalized the protocol for dysphagia diet and the precautions with min assist/cues  Outcome: Not Progressing   Problem: RH Vision Goal: RH LTG Vision (Specify) Outcome: Not Progressing    Pt is total assist for all ADLs

## 2017-04-26 NOTE — Progress Notes (Signed)
Speech Language Pathology Weekly Progress and Session Note  Patient Details  Name: Ebony Williams MRN: [id number] Date of Birth: 11/10/1950  Beginning of progress report period:  April 19, 2017  End of progress report period: April 26, 2017   Today's Date: 04/26/2017 SLP Individual Time: 1005-1105 SLP Individual Time Calculation (min): 60 min  Short Term Goals: Week 2: SLP Short Term Goal 1 (Week 2): Pt will verbally convey her needs and wants to caregivers at the word level in 75% of opportunities with min assist verbal cues. SLP Short Term Goal 1 - Progress (Week 2): Progressing toward goal SLP Short Term Goal 2 (Week 2): Pt will name basic, familiar objects for 75% accuracy with min assist verbal cues for word finding.  SLP Short Term Goal 2 - Progress (Week 2): Progressing toward goal SLP Short Term Goal 3 (Week 2): Pt will locate items to the right of midline during basic, familiar tasks in 75% of opportunities with min assist verbal cues.  SLP Short Term Goal 3 - Progress (Week 2): Met SLP Short Term Goal 4 (Week 2): Pt will consume dys 1 textures and nectar thick liquids with supervision cues for use of swallowing precautions.  SLP Short Term Goal 4 - Progress (Week 2): Met SLP Short Term Goal 5 (Week 2): Pt will consume therapeutic trials of thin liquids with supervision verbal cues for use of swallowing precautions.   SLP Short Term Goal 5 - Progress (Week 2): Progressing toward goal SLP Short Term Goal 6 (Week 2): Pt will sustain her attention to basic, familiar tasks for 5 minute intervals with min assist verbal cues for redirection.   SLP Short Term Goal 6 - Progress (Week 2): Met    New Short Term Goals: Week 3: SLP Short Term Goal 1 (Week 3): Pt will verbally convey her needs and wants to caregivers at the word level in 75% of opportunities with min assist verbal cues. SLP Short Term Goal 2 (Week 3): Pt will name basic, familiar objects for 75% accuracy with min assist  verbal cues for word finding.  SLP Short Term Goal 3 (Week 3): Pt will locate items to the right of midline during basic, familiar tasks in 75% of opportunities with supervision verbal cues.  SLP Short Term Goal 4 (Week 3): Pt will consume dys 2 textures and nectar thick liquids with supervision cues for use of swallowing precautions.  SLP Short Term Goal 5 (Week 3): Pt will sustain her attention to basic, familiar tasks for 10 minute intervals with min assist verbal cues for redirection.   SLP Short Term Goal 6 (Week 3): Pt will consume therapeutic trials of thin liquids with supervision verbal cues for use of swallowing precautions.    Weekly Progress Updates:   Pt has made functional gains this reporting period and has met 3 out of 6 short term goals.  Pt is currently mod assist for basic tasks due to expressive aphasia and moderate cognitive deficits.  Pt has demonstrated improved alertness which in turn has contributed to improved spontaneous verbal expression.  Pt is consuming a dys 2, nectar thick liquids diet and trials of thin liquids per the water protocol with min-supervision cues for use of swallowing precautions.  Pt and family education is ongoing.  Pt would continue to benefit from skilled ST while inpatient in order to maximize functional independence and reduce burden of care prior to discharge.  Anticipate that pt will need 24/7 supervision at discharge in addition to  ST follow at next level of care.  Pt's family is considering changing pt's d/c plan to SNF.     Intensity: Minumum of 1-2 x/day, 30 to 90 minutes Frequency: 3 to 5 out of 7 days Duration/Length of Stay: 21 days  Treatment/Interventions: Cognitive remediation/compensation;Cueing hierarchy;Dysphagia/aspiration precaution training;Environmental controls;Internal/external aids;Multimodal communication approach;Speech/Language facilitation;Patient/family education;Functional tasks   Daily Session  Skilled Therapeutic  Interventions: Pt was seen for skilled ST targeting goals for dysphagia and communication. Pt's family was present with Education officer, museum upon therapist's arrival.  Pt spontaneously told her husband "You better stop that and do what she's here for," when pt's husband had numerous questions regarding potential SNF placement post discharge.   Pt consumed therapeutic trials of thin liquids following oral care with mod faded to supervision cues for use of head turn to the right.  Pt would at times forget to use strategy; however, she immediately recognized and corrected errors in 100% of opportunities.  Pt demonstrated x3 immediate coughing episodes which appeared to be related to rate of intake.   Pt was able to name objects in pictures 40% accuracy independently, improving to ~80% accuracy with min-mod assist semantic or sentence completion cues.  Additionally, pt was able to utilize a phrase to describe actions in pictures with mod assist verbal cues for sentence organization and word finding.       Function:   Eating Eating   Modified Consistency Diet: Yes Eating Assist Level: Supervision or verbal cues           Cognition Comprehension Comprehension assist level: Understands basic 75 - 89% of the time/ requires cueing 10 - 24% of the time  Expression   Expression assist level: Expresses basic 50 - 74% of the time/requires cueing 25 - 49% of the time. Needs to repeat parts of sentences.  Social Interaction Social Interaction assist level: Interacts appropriately 75 - 89% of the time - Needs redirection for appropriate language or to initiate interaction.  Problem Solving Problem solving assist level: Solves basic 50 - 74% of the time/requires cueing 25 - 49% of the time  Memory Memory assist level: Recognizes or recalls 25 - 49% of the time/requires cueing 50 - 75% of the time   Therapy/Group: Individual Therapy  Yuvin Bussiere, Selinda Orion 04/26/2017, 3:59 PM

## 2017-04-27 ENCOUNTER — Inpatient Hospital Stay (HOSPITAL_COMMUNITY): Payer: Medicare Other | Admitting: Physical Therapy

## 2017-04-27 ENCOUNTER — Inpatient Hospital Stay (HOSPITAL_COMMUNITY): Payer: Medicare Other | Admitting: Occupational Therapy

## 2017-04-27 ENCOUNTER — Inpatient Hospital Stay (HOSPITAL_COMMUNITY): Payer: Medicare Other | Admitting: Speech Pathology

## 2017-04-27 LAB — GLUCOSE, CAPILLARY
GLUCOSE-CAPILLARY: 126 mg/dL — AB (ref 65–99)
Glucose-Capillary: 136 mg/dL — ABNORMAL HIGH (ref 65–99)
Glucose-Capillary: 180 mg/dL — ABNORMAL HIGH (ref 65–99)
Glucose-Capillary: 116 mg/dL — ABNORMAL HIGH (ref 65–99)
Glucose-Capillary: 188 mg/dL — ABNORMAL HIGH (ref 65–99)

## 2017-04-27 NOTE — Progress Notes (Signed)
[facility] PHYSICAL MEDICINE & REHABILITATION     PROGRESS NOTE  Subjective/Complaints:  Patient seen lying in bed this morning. She states she slept well overnight. No reported issues overnight.  ROS: Denies CP, SOB, N/V/D  Objective: Vital Signs: Blood pressure (!) 142/87, pulse 86, temperature (!) 97.4 F (36.3 C), temperature source Oral, resp. rate 17, height 5\' 1"  (1.549 m), weight 76.5 kg (168 lb 10.4 oz), SpO2 98 %. No results found. Recent Labs    04/25/17 0459  WBC 12.2*  HGB 16.3*  HCT 47.4*  PLT 211   Recent Labs    04/25/17 0459  NA 135  K 3.5  CL 97*  GLUCOSE 140*  BUN 17  CREATININE 0.65  CALCIUM 9.0   CBG (last 3)  Recent Labs    04/26/17 1126 04/26/17 1637 04/27/17 0642  GLUCAP 204* 149* 136*    Wt Readings from Last 3 Encounters:  04/18/17 76.5 kg (168 lb 10.4 oz)  04/06/17 74.5 kg (164 lb 3.9 oz)  02/28/17 79.4 kg (175 lb)    Physical Exam:  BP (!) 142/87 (BP Location: Left Arm)   Pulse 86   Temp (!) 97.4 F (36.3 C) (Oral)   Resp 17   Ht 5\' 1"  (1.549 m)   Wt 76.5 kg (168 lb 10.4 oz)   SpO2 98%   BMI 31.87 kg/m   Constitutional: No distress . Vital signs reviewed. HENT: Normocephalic, atraumatic. Eyes: EOMI, No discharge. Cardiovascular: RRR. No JVD    Respiratory: CTA Bilaterally. Normal effort    GI: BS +, non-distended  Musculoskeletal: No edema or tenderness in extremities  Neurological: Lethargic Dysphonia Motor: LUE 4-4+/5 proximal to distal LLE: 4/5 hip flexion, 4+/5 knee extension, 4+/5 ADF RUE/RLE: 0/5 proximal to distal (stable) Increase in tone noted Skin: Skin is warm and dry.  Psychiatric: lethargic  Assessment/Plan: 1. 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 Physiatrist is providing close team supervision and 24 hour management of active medical problems listed  below. Physiatrist and rehab team continue to assess barriers to discharge/monitor patient progress toward functional and medical goals.  Function:  Bathing Bathing position   Position: Sitting EOB  Bathing parts Body parts bathed by patient: Right arm, Chest, Abdomen, Right upper leg, Left upper leg, Right lower leg, Left lower leg Body parts bathed by helper: Left arm, Front perineal area, Buttocks, Back  Bathing assist Assist Level: (Mod assist)      Upper Body Dressing/Undressing Upper body dressing   What is the patient wearing?: Pull over shirt/dress     Pull over shirt/dress - Perfomed by patient: Thread/unthread left sleeve, Put head through opening Pull over shirt/dress - Perfomed by helper: Thread/unthread right sleeve, Pull shirt over trunk        Upper body assist Assist Level: (Mod assist)      Lower Body Dressing/Undressing Lower body dressing   What is the patient wearing?: Pants, Non-skid slipper socks       Pants- Performed by helper: Thread/unthread right pants leg, Thread/unthread left pants leg, Pull pants up/down   Non-skid slipper socks- Performed by helper: Don/doff right sock, Don/doff left sock   Socks - Performed by helper: Don/doff right sock, Don/doff left sock Shoes - Performed by patient: Don/doff left shoe Shoes - Performed by helper: Don/doff right shoe, Fasten right, Fasten left          Lower  body assist Assist for lower body dressing: (Total assist)      Toileting Toileting Toileting activity did not occur: No continent bowel/bladder event   Toileting steps completed by helper: Performs perineal hygiene, Adjust clothing prior to toileting, Adjust clothing after toileting    Toileting assist Assist level: Touching or steadying assistance (Pt.75%)   Transfers Chair/bed transfer   Chair/bed transfer method: Stand pivot Chair/bed transfer assist level: Moderate assist (Pt 50 - 74%/lift or lower) Chair/bed transfer assistive  device: Armrests Mechanical lift: Ecologist Ambulation activity did not occur: Safety/medical concerns   Max distance: 10 Assist level: Maximal assist (Pt 25 - 49%)   Wheelchair Wheelchair activity did not occur: Safety/medical concerns Type: Manual   Assist Level: Dependent (Pt equals 0%)  Cognition Comprehension Comprehension assist level: Understands basic 75 - 89% of the time/ requires cueing 10 - 24% of the time  Expression Expression assist level: Expresses basic 50 - 74% of the time/requires cueing 25 - 49% of the time. Needs to repeat parts of sentences.  Social Interaction Social Interaction assist level: Interacts appropriately 75 - 89% of the time - Needs redirection for appropriate language or to initiate interaction.  Problem Solving Problem solving assist level: Solves basic 50 - 74% of the time/requires cueing 25 - 49% of the time  Memory Memory assist level: Recognizes or recalls 25 - 49% of the time/requires cueing 50 - 75% of the time    Medical Problem List and Plan: 1.  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  DVT Prophylaxis/Anticoagulation: SCDs. Monitor for any signs of DVT 3. Pain Management: Tylenol as needed 4. Mood: Effexor 75 mg daily, Xanax 0.5 mg 3 times a day as needed   Amantadine 100 mg with breakfast and lunch started on 3/9, increased to 200 on 3/20.  5. Neuropsych: This patient is not capable of making decisions on his own behalf. 6. Skin/Wound Care: Routine skin checks 7. Fluids/Electrolytes/Nutrition: Routine I&O's 8. Hypertension.    HCTZ 12.5 mg daily. Changed to [place] 25 mg on 3/8 given increase in BUN/CR   Avapro 300 mg daily.    Norvasc 5 mg started on 3/21   Improving on 3/22 9. Dysphagia. Diet advanced to D2/nectars 10. Prediabetes:    CBGs before meals and at bedtime     Added sliding scale insulin for covg   Metformin 250 started on 3/20   ?Improving on 3/22 11. Transaminitis: Resolved   Continue to monitor 12. Leukocytosis   WBCs 12.2 on 3/20   UA equivocal, Ucx no growth   CXR  Stable   Afebrile    Labs ordered for Monday  13. Spasticity:   increasing tone RUE/RLE   continue ROM/splinting   given sedation, stopped baclofen  14. Hypokalemia   K+ 3.5  On 3/20   Cont daily supplementation  LOS (Days) 16 A FACE TO FACE EVALUATION WAS PERFORMED  Ankit [name] 04/27/2017 8:29 AM

## 2017-04-27 NOTE — Progress Notes (Signed)
Physical Therapy Session Note  Patient Details  Name: Ebony Williams MRN: [id number] Date of Birth: [birth year]  Today's Date: 04/27/2017 PT Individual Time: 0900-0945 PT Individual Time Calculation (min): 45 min   Short Term Goals: Week 3:  PT Short Term Goal 1 (Week 3): = LTG  Skilled Therapeutic Interventions/Progress Updates:  Pt supine in bed, agreeable to participate in therapy session. Pt reports no pain at this time. Supine to sit mod assist for RLE and trunk control. Pt is able to don shirt with min assist while seated EOB. Sit to stand with mod assist, dependent to pull up pants while in standing. Squat pivot transfer bed to w/c with mod assist. Manual w/c propulsion 2 x 150 ft with min assist and verbal cues to attend to obstacles on R side. Sit to stand mod assist in // bars, attempt to take steps but pt has increased pain in RLE in standing due to knee hyperextension. Pt left seated in w/c in room with needs in reach, quick release belt in place, lap tray in place.  Therapy Documentation Precautions:  Precautions Precautions: Fall Precaution Comments: right hemiplegia and neglect, extensor tone in the left elbow and in the shoulder Restrictions Weight Bearing Restrictions: No  See Function Navigator for Current Functional Status.   Therapy/Group: Individual Therapy   Excell Seltzer, PT, DPT  04/27/2017, 3:47 PM

## 2017-04-27 NOTE — Progress Notes (Signed)
Speech Language Pathology Daily Session Note  Patient Details  Name: Ebony Williams MRN: [id number] Date of Birth: 1950/06/05  Today's Date: 04/27/2017 SLP Individual Time: 5462-7035 SLP Individual Time Calculation (min): 43 min  Short Term Goals: Week 3: SLP Short Term Goal 1 (Week 3): Pt will verbally convey her needs and wants to caregivers at the word level in 75% of opportunities with min assist verbal cues. SLP Short Term Goal 2 (Week 3): Pt will name basic, familiar objects for 75% accuracy with min assist verbal cues for word finding.  SLP Short Term Goal 3 (Week 3): Pt will locate items to the right of midline during basic, familiar tasks in 75% of opportunities with supervision verbal cues.  SLP Short Term Goal 4 (Week 3): Pt will consume dys 2 textures and nectar thick liquids with supervision cues for use of swallowing precautions.  SLP Short Term Goal 5 (Week 3): Pt will sustain her attention to basic, familiar tasks for 10 minute intervals with min assist verbal cues for redirection.   SLP Short Term Goal 6 (Week 3): Pt will consume therapeutic trials of thin liquids with supervision verbal cues for use of swallowing precautions.    Skilled Therapeutic Interventions:   Pt was seen for skilled ST targeting goals for dysphagia and communication.  SLP facilitated the session with trials of water following oral care per the water protocol.  Pt recalled and utilized swallowing precautions with min assist verbal cues.  She had x3 instances of coughing with trials which appeared to be related to timing of boluses and posture.  SLP also facilitated the session with picture description tasks to address expansion of verbal utterances and word finding.  Pt completed task with mod assist sentence completion and semantic cues.  Her spontaneous verbal expression was noted to be improved in comparison to previous therapy sessions as evidenced by pt's ability to respond appropriately to more open  ended questions at the phrase-short sentence level.   Pt was returned to room and left in wheelchair with quick release belt donned and call bell within reach.  Continue per current plan of care.     Function:  Eating Eating   Modified Consistency Diet: Yes Eating Assist Level: Supervision or verbal cues           Cognition Comprehension Comprehension assist level: Understands basic 75 - 89% of the time/ requires cueing 10 - 24% of the time  Expression   Expression assist level: Expresses basic 50 - 74% of the time/requires cueing 25 - 49% of the time. Needs to repeat parts of sentences.  Social Interaction Social Interaction assist level: Interacts appropriately 75 - 89% of the time - Needs redirection for appropriate language or to initiate interaction.  Problem Solving Problem solving assist level: Solves basic 50 - 74% of the time/requires cueing 25 - 49% of the time  Memory Memory assist level: Recognizes or recalls 50 - 74% of the time/requires cueing 25 - 49% of the time    Pain Pain Assessment Pain Scale: 0-10 Pain Score: 0-No pain  Therapy/Group: Individual Therapy  Tailyn Hantz, Selinda Orion 04/27/2017, 2:43 PM

## 2017-04-27 NOTE — Progress Notes (Signed)
Occupational Therapy Weekly Progress Note  Patient Details  Name: Ebony Williams MRN: [id number] Date of Birth: March 21, 1950  Beginning of progress report period: April 19, 2017 End of progress report period: April 27, 2017  Today's Date: 04/27/2017 OT Individual Time: 1000-1100 OT Individual Time Calculation (min): 60 min    Patient has met 1 of 4 short term goals.  Pt is making slow progress towards goals due to continued lethargy with increase in lethargy for 24-48 hours due to starting baclofen (since discontinued).  Towards end of reporting period, pt is much more alert and participatory during self-care tasks and therapy sessions.  Pt is able to participate in LB bathing/dressing utilizing figure 4 position with setup to obtain and maintain position.  Pt is demonstrating improved sit > stand, able to complete with as little as min assist and blocking of Rt knee.  Pt continues to demonstrate increased tone in RUE and RLE during functional activities and Rt inattention to body and environment.  Pt is able to complete squat pivot transfers and mod-max assist level, therapy has continued to utilize [place] for bathroom transfers due to safety and urgency.  Patient continues to demonstrate the following deficits: muscle weakness,decreased oxygen support,impaired timing and sequencing, abnormal tone, unbalanced muscle activation, motor apraxia, decreased coordination and decreased motor planning,decreased visual perceptual skills,decreased attention to right,decreased initiation, decreased attention, decreased awareness, decreased problem solving and decreased safety awarenessand decreased standing balance, decreased postural control, hemiplegia and decreased balance strategies and therefore will continue to benefit from skilled OT intervention to enhance overall performance with BADL and Reduce care partner burden.  Patient progressing toward long term goals..  Continue plan of care.  OT Short  Term Goals Week 2:  OT Short Term Goal 1 (Week 2): Pt will complete toilet transfer with max assist of 1 caregiver OT Short Term Goal 1 - Progress (Week 2): Met OT Short Term Goal 2 (Week 2): Pt will complete LB dressing with max assist of one caregiver OT Short Term Goal 2 - Progress (Week 2): Progressing toward goal OT Short Term Goal 3 (Week 2): Pt will complete bathing with min assist OT Short Term Goal 3 - Progress (Week 2): Progressing toward goal OT Short Term Goal 4 (Week 2): Pt will complete UB dressing with min assist OT Short Term Goal 4 - Progress (Week 2): Progressing toward goal Week 3:  OT Short Term Goal 1 (Week 3): Pt will complete toilet transfer with mod assist of 1 caregiver OT Short Term Goal 2 (Week 3): Pt will complete LB dressing with max assist of one caregiver OT Short Term Goal 3 (Week 3): Pt will complete bathing with min assist OT Short Term Goal 4 (Week 3): Pt will complete UB dressing with min assist  Skilled Therapeutic Interventions/Progress Updates:    Treatment session with focus on increased participation in self-care tasks.  Pt received upright in w/c willing to engage in bathing at shower level.  Utilized Stedy to transfer to roll-in shower chair with pt able to complete sit > stand with min assist pulling up on Stedy bar.  Pt demonstrating increased arousal during shower this session as well as increased participation.  Pt able to wash lower legs in figure 4 position with assist to obtain and maintain position.  Educated on hemi-technique with dressing, pt continuing to require assistance to thread RUE due to tone.  Mod assist sit > stand without UE support to pull up pants.  Pt initiating pulling up pants  on Lt side, requiring assist to maintain standing balance and to pull up pants over Rt hip.  Pt returned to bed at end of session, mod assist squat pivot.  Therapy Documentation Precautions:  Precautions Precautions: Fall Precaution Comments: right  hemiplegia and neglect, extensor tone in the left elbow and in the shoulder Restrictions Weight Bearing Restrictions: No Pain:  Pt with no c/o pain  See Function Navigator for Current Functional Status.   Therapy/Group: Individual Therapy  Simonne Come 04/27/2017, 12:53 PM

## 2017-04-27 NOTE — Progress Notes (Signed)
RN called stating patient was ready to put on her CPAP. RT spoke with patient and patient does not wish to wear tonight. She states she will wear tomorrow. RT will continue to monitor.

## 2017-04-27 NOTE — Progress Notes (Signed)
Physical Therapy Weekly Progress Note  Patient Details  Name: Ebony Williams MRN: [id number] Date of Birth: 07/31/1950  Beginning of progress report period: April 20, 2017 End of progress report period: April 27, 2017  Today's Date: 04/27/2017 PT Individual Time: 1400-1445 PT Individual Time Calculation (min): 45 min   Pt up in chair and agreeable to treatment with encouragement. Pt reports fatigue from earlier sessions but is willing to try "walking".  Pt performs w/c mobility with min A x 25' with hemi technique.  Gait at railing in hallway 10' x 3 with mod A for wt shifts, Rt LE placement and trunk and hip control.  Pt with increasing tone in Rt ankle requiring bracing by PT to prevent increased eversion during stance.  Pt then incontinent of bowel.  Pt performs sit to stand in stedy with mod A, standing balance in stedy with min guard for Rt trunk and UE control and safety, total A for clothing management and hygiene. Pt then refuses continued PT session due to fatigue. Pt mod A with bed mobility, positioned for safety and comfort in sidelying per pt request. Pt left in bed with needs at hand, alarm set.  Patient has met 1 of 2 short term goals.  Pt with fluctuating alertness and arousal limiting participation with therapy and leading to slow progress towards goals.  Pt mod/max A with transfers depending on arousal, pt has initiated gait training with mod A at railing, pt min A with w/c mobility in controlled environments.  Patient continues to demonstrate the following deficits muscle weakness, abnormal tone, unbalanced muscle activation, decreased coordination and decreased motor planning, decreased initiation, decreased attention, decreased awareness, decreased memory and delayed processing and decreased standing balance, decreased postural control, hemiplegia and decreased balance strategies and therefore will continue to benefit from skilled PT intervention to increase functional  independence with mobility.  Patient progressing toward long term goals..  Continue plan of care.  PT Short Term Goals Week 2:  PT Short Term Goal 1 (Week 2): pt will consistently perform transfers with mod A PT Short Term Goal 1 - Progress (Week 2): Progressing toward goal PT Short Term Goal 2 (Week 2): pt will propel w/c in controlled environment x 25' with min A PT Short Term Goal 2 - Progress (Week 2): Met Week 3:  PT Short Term Goal 1 (Week 3): = LTG  Skilled Therapeutic Interventions/Progress Updates:  Ambulation/gait training;Balance/vestibular training;Cognitive remediation/compensation;Community reintegration;Discharge planning;DME/adaptive equipment instruction;Functional electrical stimulation;Functional mobility training;Neuromuscular re-education;Patient/family education;Psychosocial support;Splinting/orthotics;Stair training;Therapeutic Activities;Therapeutic Exercise;UE/LE Strength taining/ROM;UE/LE Coordination activities;Visual/perceptual remediation/compensation;Wheelchair propulsion/positioning;Pain management   Therapy Documentation Precautions:  Precautions Precautions: Fall Precaution Comments: right hemiplegia and neglect, extensor tone in the left elbow and in the shoulder Restrictions Weight Bearing Restrictions: No Pain: Pain Assessment Pain Scale: 0-10 Pain Score: 0-No pain      See Function Navigator for Current Functional Status.  Therapy/Group: Individual Therapy  Dante Cooter 04/27/2017, 2:50 PM

## 2017-04-28 ENCOUNTER — Inpatient Hospital Stay (HOSPITAL_COMMUNITY): Payer: Medicare Other

## 2017-04-28 LAB — GLUCOSE, CAPILLARY
GLUCOSE-CAPILLARY: 183 mg/dL — AB (ref 65–99)
Glucose-Capillary: 120 mg/dL — ABNORMAL HIGH (ref 65–99)
Glucose-Capillary: 139 mg/dL — ABNORMAL HIGH (ref 65–99)
Glucose-Capillary: 128 mg/dL — ABNORMAL HIGH (ref 65–99)

## 2017-04-28 NOTE — Plan of Care (Signed)
  Problem: RH SKIN INTEGRITY Goal: RH STG SKIN FREE OF INFECTION/BREAKDOWN Description With Mod. assist  Outcome: Progressing Goal: RH STG MAINTAIN SKIN INTEGRITY WITH ASSISTANCE Description STG Maintain Skin Integrity With Mod. Assistance.  Outcome: Progressing   Problem: RH SAFETY Goal: RH STG ADHERE TO SAFETY PRECAUTIONS W/ASSISTANCE/DEVICE Description STG Adhere to Safety Precautions With Mod.Assistance/Device.  Outcome: Progressing Goal: RH STG DECREASED RISK OF FALL WITH ASSISTANCE Description STG Decreased Risk of Fall With Mod.Assistance.  Outcome: Progressing   Problem: RH PAIN MANAGEMENT Goal: RH STG PAIN MANAGED AT OR BELOW PT'S PAIN GOAL Description Less than 3,on 1 to 10 scale  Outcome: Progressing   Problem: RH KNOWLEDGE DEFICIT Goal: RH STG INCREASE KNOWLEDGE OF HYPERTENSION Description Family will be able to verbalize the provided material about hypertension with min assist  Outcome: Progressing Goal: RH STG INCREASE KNOWLEDGE OF DYSPHAGIA/FLUID INTAKE Description Family will be able to verbalized the protocol for dysphagia diet and the precautions with min assist/cues  Outcome: Progressing   Problem: RH BOWEL ELIMINATION Goal: RH STG MANAGE BOWEL WITH ASSISTANCE Description STG Manage Bowel with Mod. Assistance.  Outcome: Not Progressing Goal: RH STG MANAGE BOWEL W/MEDICATION W/ASSISTANCE Description STG Manage Bowel with .Medication with mod Assistance.   Outcome: Not Progressing   Problem: RH BLADDER ELIMINATION Goal: RH STG MANAGE BLADDER WITH ASSISTANCE Description STG Manage Bladder With Mod. Assistance  Outcome: Not Progressing

## 2017-04-28 NOTE — Progress Notes (Signed)
Ebony Williams is a 67 y.o. female 1950-03-07 [id number]  Subjective: No new complaints. Feels "down" and unmotivated. "Not as good today as yesterday". No specific problems or pain. Slept OK but feels "so tired".  Objective: Vital signs in last 24 hours: Temp:  [98.5 F (36.9 C)-98.6 F (37 C)] 98.6 F (37 C) (03/23 0500) Pulse Rate:  [70-81] 70 (03/23 0500) Resp:  [18] 18 (03/23 0500) BP: (125-154)/(54-77) 152/63 (03/23 0907) SpO2:  [95 %-97 %] 95 % (03/23 0500) Weight change:  Last BM Date: 04/27/17  Intake/Output from previous day: 03/22 0701 - 03/23 0700 In: 360 [P.O.:360] Out: -   Physical Exam General: No apparent distress   Emotionally dispondent Lungs: Normal effort. Lungs clear to auscultation, no crackles or wheezes. Cardiovascular: Regular rate and rhythm, no edema Neurological: No new neurological deficits: R HP unchanged  Lab Results: BMET    Component Value Date/Time   NA 135 04/25/2017 0459   K 3.5 04/25/2017 0459   CL 97 (L) 04/25/2017 0459   CO2 28 04/25/2017 0459   GLUCOSE 140 (H) 04/25/2017 0459   BUN 17 04/25/2017 0459   CREATININE 0.65 04/25/2017 0459   CALCIUM 9.0 04/25/2017 0459   GFRNONAA >60 04/25/2017 0459   GFRAA >60 04/25/2017 0459   CBC    Component Value Date/Time   WBC 12.2 (H) 04/25/2017 0459   RBC 5.22 (H) 04/25/2017 0459   HGB 16.3 (H) 04/25/2017 0459   HCT 47.4 (H) 04/25/2017 0459   PLT 211 04/25/2017 0459   MCV 90.8 04/25/2017 0459   MCH 31.2 04/25/2017 0459   MCHC 34.4 04/25/2017 0459   RDW 13.7 04/25/2017 0459   LYMPHSABS 2.8 04/20/2017 1048   MONOABS 0.8 04/20/2017 1048   EOSABS 0.2 04/20/2017 1048   BASOSABS 0.0 04/20/2017 1048   CBG's (last 3):   Recent Labs    04/27/17 1646 04/27/17 2058 04/28/17 0648  GLUCAP 116* 188* 183*   LFT's Lab Results  Component Value Date   ALT 43 04/25/2017   AST 31 04/25/2017   ALKPHOS 82 04/25/2017   BILITOT 0.9 04/25/2017    Studies/Results: No results  found.  Medications:  I have reviewed the patient's current medications. Scheduled Medications: . amantadine  200 mg Oral BID  . amLODipine  5 mg Oral Daily  . aspirin  81 mg Oral Daily  . chlorthalidone  25 mg Oral Daily  . clopidogrel  75 mg Oral Daily  . insulin aspart  0-9 Units Subcutaneous TID WC  . irbesartan  300 mg Oral Daily  . metFORMIN  250 mg Oral Q breakfast  . pantoprazole sodium  40 mg Oral Daily  . potassium chloride  20 mEq Oral Daily  . venlafaxine XR  75 mg Oral Daily   PRN Medications: acetaminophen **OR** acetaminophen (TYLENOL) oral liquid 160 mg/5 mL **OR** acetaminophen, ALPRAZolam, cloNIDine, ondansetron **OR** ondansetron (ZOFRAN) IV, RESOURCE THICKENUP CLEAR, senna-docusate, sorbitol  Assessment/Plan: Principal Problem:   Left middle cerebral artery stroke (HCC) Active Problems:   Prediabetes   Depression   Essential hypertension   Right hemiplegia (HCC)   Lethargy   Hypokalemia  1. L MCA CVA with R HP and aphasia - continue med mgmt and ongoing CIR therapy 2. Depression - note addition of mood stimulator atop chronic SNRI meds. Ego support provided today - monitor 3. hypertension - monitor BP and continue meds as ongoing 4. preDM - on metformin + SSI   Length of stay, days: 17   Nitika Jackowski A.  Asa Lente, MD 04/28/2017, 9:38 AM

## 2017-04-28 NOTE — Progress Notes (Signed)
Speech Language Pathology Daily Session Note  Patient Details  Name: Ebony Williams MRN: [id number] Date of Birth: [birth year]  Today's Date: 04/28/2017 SLP Individual Time: 4270-6237 / 930-945 SLP Individual Time Calculation (min): 30 min / 15 min  Short Term Goals: Week 3: SLP Short Term Goal 1 (Week 3): Pt will verbally convey her needs and wants to caregivers at the word level in 75% of opportunities with min assist verbal cues. SLP Short Term Goal 2 (Week 3): Pt will name basic, familiar objects for 75% accuracy with min assist verbal cues for word finding.  SLP Short Term Goal 3 (Week 3): Pt will locate items to the right of midline during basic, familiar tasks in 75% of opportunities with supervision verbal cues.  SLP Short Term Goal 4 (Week 3): Pt will consume dys 2 textures and nectar thick liquids with supervision cues for use of swallowing precautions.  SLP Short Term Goal 5 (Week 3): Pt will sustain her attention to basic, familiar tasks for 10 minute intervals with min assist verbal cues for redirection.   SLP Short Term Goal 6 (Week 3): Pt will consume therapeutic trials of thin liquids with supervision verbal cues for use of swallowing precautions.    Skilled Therapeutic Interventions: 1#Skilled ST services focused on swallow skills. SLP facilitated proper positioning in bed for PO consumption, pt demonstrated ability to follow 1 step directions with mod A verbal cues. SLP provided set up assist for oral care and mod A verbal cues to initiate next steps in procedural task. SLP facilitated PO consumption of thin liquid via cup sips, pt initially demonstrated verbal recall of swallow strategies (turn head to right) , however required max A verbal/visual cues to utilize strategy, pt demonstrated initial minimum throat clear during consumption of 2oz.  Pt was left in room with call bell within reach. Reccomend to continue skilled ST services.    2#Skilled ST services focused on swallow  skills.SLP facilitated proper positioning in bed for PO consumption, pt demonstrated ability to follow 1 step directions with min A verbal cues. SLP provided set up assist for oral care and min A verbal cues to initiate next steps in procedural task. SLP facilitated PO consumption of thin liquid via cup sips, pt was unable to initially demonstrated verbal recall of swallow strategies (turn head to right) instead perseverating on steps for oral care. SLP created visual aid and put up in room to remind pt to utilize swallow strategy and placed selected picture by pt, of granddaughter on right side as further visual aid. Pt required mod-min A verbal/visual cues to utilize strategy, pt demonstrated minimum immediate cough during consumption of 4oz. Pt demonstrated increase awareness of error,not utilizing strategy than in pervious session . Pt was left in room with call bell within reach. Reccomend to continue skilled ST services.       Function:  Eating Eating   Modified Consistency Diet: No(thin trial) Eating Assist Level: Set up assist for;Supervision or verbal cues;More than reasonable amount of time   Eating Set Up Assist For: Opening containers       Cognition Comprehension Comprehension assist level: Understands basic 50 - 74% of the time/ requires cueing 25 - 49% of the time;Understands basic 75 - 89% of the time/ requires cueing 10 - 24% of the time  Expression   Expression assist level: Expresses basic 50 - 74% of the time/requires cueing 25 - 49% of the time. Needs to repeat parts of sentences.  Social Interaction Social  Interaction assist level: Interacts appropriately 50 - 74% of the time - May be physically or verbally inappropriate.  Problem Solving Problem solving assist level: Solves basic 25 - 49% of the time - needs direction more than half the time to initiate, plan or complete simple activities;Solves basic 50 - 74% of the time/requires cueing 25 - 49% of the time  Memory Memory  assist level: Recognizes or recalls 25 - 49% of the time/requires cueing 50 - 75% of the time    Pain Pain Assessment Pain Score: 0-No pain  Therapy/Group: Individual Therapy  Zaniyah Wernette  [facility] 04/28/2017, 3:01 PM

## 2017-04-29 ENCOUNTER — Inpatient Hospital Stay (HOSPITAL_COMMUNITY): Payer: Medicare Other

## 2017-04-29 LAB — GLUCOSE, CAPILLARY
GLUCOSE-CAPILLARY: 126 mg/dL — AB (ref 65–99)
GLUCOSE-CAPILLARY: 154 mg/dL — AB (ref 65–99)
Glucose-Capillary: 141 mg/dL — ABNORMAL HIGH (ref 65–99)
Glucose-Capillary: 127 mg/dL — ABNORMAL HIGH (ref 65–99)

## 2017-04-29 MED ORDER — PANTOPRAZOLE SODIUM 40 MG PO TBEC
40.0000 mg | DELAYED_RELEASE_TABLET | Freq: Every day | ORAL | Status: DC
  Administered 2017-04-29 – 2017-05-07 (×9): 40 mg via ORAL
  Filled 2017-04-29 (×9): qty 1

## 2017-04-29 NOTE — Progress Notes (Addendum)
Physical Therapy Note  Patient Details  Name: LAKEETA DOBOSZ MRN: [id number] Date of Birth: 11/14/1950 Today's Date: 04/29/2017  1455-1535, 40 min individual tx Pain: none  Pt asleep in bed, but easily awakened.  Neuromuscular re-education via multimodal cues, positioning, gentle joint mobilizations (ankle),  for supine: active and passive hip, knee and ankle ROM, R mass extension after PT placed RLE into mass flexed position, bil lower trunk rotation, isolated R PF.  Supine> sit EOB .  Therapeutic activities sitting EOB for wt shifting forward/R/L while maintaining balance.  Scooting L><R sitting EOB in preparation for transfer.  PT educated and demonstrated to pt : head/hips relationship.  Squat pivot bed> w/c to R with mod assist; w/c> bed to L with max assist.  Pt's short stature is a limiting factor for transfers. Sit> supine with min assist for RLE.  Pt left resting in bed with alarm set and all needs at hand.  Son present; PT encouraged him to sit to pt's R to increase her R attention.  See function navigator for current status.  Ernesta Trabert 04/29/2017, 3:01 PM

## 2017-04-30 ENCOUNTER — Inpatient Hospital Stay (HOSPITAL_COMMUNITY): Payer: Medicare Other | Admitting: Physical Therapy

## 2017-04-30 ENCOUNTER — Inpatient Hospital Stay (HOSPITAL_COMMUNITY): Payer: Medicare Other | Admitting: Speech Pathology

## 2017-04-30 ENCOUNTER — Inpatient Hospital Stay (HOSPITAL_COMMUNITY): Payer: Medicare Other | Admitting: Occupational Therapy

## 2017-04-30 DIAGNOSIS — F329 Major depressive disorder, single episode, unspecified: Secondary | ICD-10-CM

## 2017-04-30 LAB — BASIC METABOLIC PANEL
Anion gap: 14 (ref 5–15)
BUN: 16 mg/dL (ref 6–20)
CHLORIDE: 99 mmol/L — AB (ref 101–111)
CO2: 21 mmol/L — ABNORMAL LOW (ref 22–32)
Calcium: 9.1 mg/dL (ref 8.9–10.3)
Creatinine, Ser: 0.73 mg/dL (ref 0.44–1.00)
GFR calc Af Amer: >60 mL/min (ref >60)
GFR calc non Af Amer: >60 mL/min (ref >60)
GLUCOSE: 220 mg/dL — AB (ref 65–99)
POTASSIUM: 3.4 mmol/L — AB (ref 3.5–5.1)
Sodium: 134 mmol/L — ABNORMAL LOW (ref 135–145)

## 2017-04-30 LAB — GLUCOSE, CAPILLARY
Glucose-Capillary: 138 mg/dL — ABNORMAL HIGH (ref 65–99)
Glucose-Capillary: 187 mg/dL — ABNORMAL HIGH (ref 65–99)
Glucose-Capillary: 94 mg/dL (ref 65–99)
Glucose-Capillary: 152 mg/dL — ABNORMAL HIGH (ref 65–99)

## 2017-04-30 LAB — CBC WITH DIFFERENTIAL/PLATELET
Basophils Absolute: 0 10*3/uL (ref 0.0–0.1)
Basophils Relative: 0 %
EOS PCT: 2 %
Eosinophils Absolute: 0.2 10*3/uL (ref 0.0–0.7)
HEMATOCRIT: 44.5 % (ref 36.0–46.0)
Hemoglobin: 15.6 g/dL — ABNORMAL HIGH (ref 12.0–15.0)
LYMPHS ABS: 1.9 10*3/uL (ref 0.7–4.0)
LYMPHS PCT: 23 %
MCH: 31.5 pg (ref 26.0–34.0)
MCHC: 35.1 g/dL (ref 30.0–36.0)
MCV: 89.9 fL (ref 78.0–100.0)
MONO ABS: 0.4 10*3/uL (ref 0.1–1.0)
Monocytes Relative: 4 %
Neutro Abs: 6 10*3/uL (ref 1.7–7.7)
Neutrophils Relative %: 71 %
PLATELETS: 267 10*3/uL (ref 150–400)
RBC: 4.95 MIL/uL (ref 3.87–5.11)
RDW: 13.6 % (ref 11.5–15.5)
WBC: 8.4 10*3/uL (ref 4.0–10.5)

## 2017-04-30 MED ORDER — METFORMIN HCL 500 MG PO TABS
500.0000 mg | ORAL_TABLET | Freq: Every day | ORAL | Status: DC
  Administered 2017-04-30 – 2017-05-08 (×9): 500 mg via ORAL
  Filled 2017-04-30 (×8): qty 1

## 2017-04-30 NOTE — Progress Notes (Addendum)
[place] PHYSICAL MEDICINE & REHABILITATION     PROGRESS NOTE  Subjective/Complaints:  Pt seen lying in bed this AM.  She slept well overnight.  She states she had a good weekend.   ROS: Denies CP, SOB, N/V/D  Objective: Vital Signs: Blood pressure (!) 151/69, pulse 65, temperature 97.6 F (36.4 C), temperature source Oral, resp. rate 18, height 5\' 1"  (1.549 m), weight 76.5 kg (168 lb 10.4 oz), SpO2 96 %. No results found. No results for input(s): WBC, HGB, HCT, PLT in the last 72 hours. No results for input(s): NA, K, CL, GLUCOSE, BUN, CREATININE, CALCIUM in the last 72 hours.  Invalid input(s): CO CBG (last 3)  Recent Labs    04/29/17 1647 04/29/17 2136 04/30/17 0625  GLUCAP 126* 154* 138*    Wt Readings from Last 3 Encounters:  04/18/17 76.5 kg (168 lb 10.4 oz)  04/06/17 74.5 kg (164 lb 3.9 oz)  02/28/17 79.4 kg (175 lb)    Physical Exam:  BP (!) 151/69 (BP Location: Left Arm)   Pulse 65   Temp 97.6 F (36.4 C) (Oral)   Resp 18   Ht 5\' 1"  (1.549 m)   Wt 76.5 kg (168 lb 10.4 oz)   SpO2 96%   BMI 31.87 kg/m   Constitutional: No distress . Vital signs reviewed. HENT: Normocephalic, atraumatic. Eyes: EOMI, No discharge. Cardiovascular: RRR. No JVD    Respiratory: CTA Bilaterally. Normal effort    GI: BS +, non-distended  Musculoskeletal: No edema or tenderness in extremities  Neurological: Lethargic and oriented 1 Dysphonia Motor: LUE 4-4+/5 proximal to distal LLE: 4/5 hip flexion, 4+/5 knee extension, 4+/5 ADF RUE/RLE: 0/5 proximal to distal (unchanged) Increase in tone noted Skin: Skin is warm and dry.  Psychiatric: lethargic  Assessment/Plan: 1. 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 Physiatrist is providing close team supervision and 24 hour management of active medical problems listed below. Physiatrist  and rehab team continue to assess barriers to discharge/monitor patient progress toward functional and medical goals.  Function:  Bathing Bathing position   Position: Shower  Bathing parts Body parts bathed by patient: Right arm, Chest, Abdomen, Right upper leg, Left upper leg, Right lower leg, Left lower leg Body parts bathed by helper: Left arm, Front perineal area, Buttocks, Back  Bathing assist Assist Level: (Mod assist)      Upper Body Dressing/Undressing Upper body dressing   What is the patient wearing?: Pull over shirt/dress     Pull over shirt/dress - Perfomed by patient: Thread/unthread left sleeve, Put head through opening Pull over shirt/dress - Perfomed by helper: Thread/unthread right sleeve, Pull shirt over trunk        Upper body assist Assist Level: (Mod assist)      Lower Body Dressing/Undressing Lower body dressing   What is the patient wearing?: Pants, Non-skid slipper socks     Pants- Performed by patient: Thread/unthread right pants leg Pants- Performed by helper: Thread/unthread left pants leg, Pull pants up/down   Non-skid slipper socks- Performed by helper: Don/doff right sock, Don/doff left sock   Socks - Performed by helper: Don/doff right sock, Don/doff left sock Shoes - Performed by patient: Don/doff left shoe Shoes - Performed by helper: Don/doff right shoe, Fasten right, Fasten left          Lower body assist Assist for lower body dressing: (Total assist)  Toileting Toileting Toileting activity did not occur: No continent bowel/bladder event   Toileting steps completed by helper: Performs perineal hygiene, Adjust clothing prior to toileting, Adjust clothing after toileting    Toileting assist Assist level: Touching or steadying assistance (Pt.75%)   Transfers Chair/bed transfer   Chair/bed transfer method: Squat pivot Chair/bed transfer assist level: Maximal assist (Pt 25 - 49%/lift and lower) Chair/bed transfer assistive  device: Armrests Mechanical lift: Stedy   Locomotion Ambulation Ambulation activity did not occur: Safety/medical concerns   Max distance: 10 Assist level: Maximal assist (Pt 25 - 49%)   Wheelchair Wheelchair activity did not occur: Safety/medical concerns Type: Manual Max wheelchair distance: 150 ft Assist Level: Touching or steadying assistance (Pt > 75%)  Cognition Comprehension Comprehension assist level: Understands basic 50 - 74% of the time/ requires cueing 25 - 49% of the time  Expression Expression assist level: Expresses basic 50 - 74% of the time/requires cueing 25 - 49% of the time. Needs to repeat parts of sentences.  Social Interaction Social Interaction assist level: Interacts appropriately 50 - 74% of the time - May be physically or verbally inappropriate.  Problem Solving Problem solving assist level: Solves basic 25 - 49% of the time - needs direction more than half the time to initiate, plan or complete simple activities  Memory Memory assist level: Recognizes or recalls 25 - 49% of the time/requires cueing 50 - 75% of the time    Medical Problem List and Plan: 1.  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  DVT Prophylaxis/Anticoagulation: SCDs. Monitor for any signs of DVT 3. Pain Management: Tylenol as needed 4. Mood: Effexor 75 mg daily, Xanax 0.5 mg 3 times a day as needed   Amantadine 100 mg with breakfast and lunch started on 3/9, increased to 200 on 3/20.  5. Neuropsych: This patient is not capable of making decisions on his own behalf. 6. Skin/Wound Care: Routine skin checks 7. Fluids/Electrolytes/Nutrition: Routine I&O's 8. Hypertension.    HCTZ 12.5 mg daily. Changed to [place] 25 mg on 3/8 given increase in BUN/CR   Avapro 300 mg daily.    Norvasc 5 mg started on 3/21   Labile, but overall  controlled on 3/25 9. Dysphagia. Diet advanced to D2/nectars 10. Prediabetes:    CBGs before meals and at bedtime    Added sliding scale insulin for covg   Metformin 250 started on 3/20, increased to 500 on 3/25   ?Improving on 3/22 11. Transaminitis: Resolved   Continue to monitor 12. Leukocytosis   WBCs 12.2 on 3/20   UA equivocal, Ucx no growth   CXR  Stable   Afebrile    Labs pending  13. Spasticity:   increasing tone RUE/RLE   continue ROM/splinting   Given sedation, stopped baclofen  14. Hypokalemia   K+ 3.5  On 3/20   Labs pending   Cont daily supplementation  LOS (Days) 19 A FACE TO FACE EVALUATION WAS PERFORMED  Jshon Ibe [name] 04/30/2017 9:16 AM

## 2017-04-30 NOTE — Progress Notes (Signed)
Pt refused cpap for the night. Resting comfortably. Rt to cont to monitor.

## 2017-04-30 NOTE — Progress Notes (Signed)
Speech Language Pathology Daily Session Note  Patient Details  Name: Ebony Williams MRN: [id number] Date of Birth: 1950/12/02  Today's Date: 04/30/2017 SLP Individual Time: 0930-1030 SLP Individual Time Calculation (min): 60 min  Short Term Goals: Week 3: SLP Short Term Goal 1 (Week 3): Pt will verbally convey her needs and wants to caregivers at the word level in 75% of opportunities with min assist verbal cues. SLP Short Term Goal 2 (Week 3): Pt will name basic, familiar objects for 75% accuracy with min assist verbal cues for word finding.  SLP Short Term Goal 3 (Week 3): Pt will locate items to the right of midline during basic, familiar tasks in 75% of opportunities with supervision verbal cues.  SLP Short Term Goal 4 (Week 3): Pt will consume dys 2 textures and nectar thick liquids with supervision cues for use of swallowing precautions.  SLP Short Term Goal 5 (Week 3): Pt will sustain her attention to basic, familiar tasks for 10 minute intervals with min assist verbal cues for redirection.   SLP Short Term Goal 6 (Week 3): Pt will consume therapeutic trials of thin liquids with supervision verbal cues for use of swallowing precautions.    Skilled Therapeutic Interventions: Skilled treatment session focused on dysphagia, communication goals and education of current diet recommendations/rationale for compensatory swallow strategies. [name] present, pt alert in recliner and demonstrated increased interaction. SLP facilitated session by providing skilled observation of pt consuming thin water vai cups sips with Min A to supervision cues for use of left turn to right. Recommend diet upgraded to thin liquids with full nursing supervision to aid in use of compensatory strategies. Recommend that no but nursing or daughter be present during PO consumption to decrease distraction. Daughter and pt voiced understanding. Pt conveyed wants/needs and verbal interaction with ~ 75% intelligibility at  the phrase level with Min A cues. Daughter with questions regarding rationale for head turn. All education completed. Pt left upright in recliner with daughter present and all needs within reach. Continue per current plan of care.      Function:  Eating Eating   Modified Consistency Diet: No Eating Assist Level: More than reasonable amount of time           Cognition Comprehension Comprehension assist level: Understands basic 75 - 89% of the time/ requires cueing 10 - 24% of the time;Understands basic 50 - 74% of the time/ requires cueing 25 - 49% of the time  Expression   Expression assist level: Expresses basic 50 - 74% of the time/requires cueing 25 - 49% of the time. Needs to repeat parts of sentences.;Expresses basic 75 - 89% of the time/requires cueing 10 - 24% of the time. Needs helper to occlude trach/needs to repeat words.  Social Interaction Social Interaction assist level: Interacts appropriately 50 - 74% of the time - May be physically or verbally inappropriate.;Interacts appropriately 75 - 89% of the time - Needs redirection for appropriate language or to initiate interaction.  Problem Solving Problem solving assist level: Solves basic 25 - 49% of the time - needs direction more than half the time to initiate, plan or complete simple activities;Solves basic 50 - 74% of the time/requires cueing 25 - 49% of the time  Memory Memory assist level: Recognizes or recalls 50 - 74% of the time/requires cueing 25 - 49% of the time    Pain    Therapy/Group: Individual Therapy  Adah Stoneberg 04/30/2017, 11:58 AM

## 2017-04-30 NOTE — Progress Notes (Signed)
Occupational Therapy Session Note  Patient Details  Name: Ebony Williams MRN: [id number] Date of Birth: 1950-07-04  Today's Date: 04/30/2017 OT Individual Time: 6270-3500 OT Individual Time Calculation (min): 60 min    Short Term Goals: Week 3:  OT Short Term Goal 1 (Week 3): Pt will complete toilet transfer with mod assist of 1 caregiver OT Short Term Goal 2 (Week 3): Pt will complete LB dressing with max assist of one caregiver OT Short Term Goal 3 (Week 3): Pt will complete bathing with min assist OT Short Term Goal 4 (Week 3): Pt will complete UB dressing with min assist  Skilled Therapeutic Interventions/Progress Updates:    Treatment session with focus on ADL retraining with dynamic sitting balance, standing balance, and Rt visual scanning.  Pt incontinent of bowel upon arrival.  Engaged in hygiene at bed level with pt rolling Rt and Lt with min assist.  Bathing completed seated EOB with bathing items set up in pt's Rt visual field to promote scanning to Rt to obtain items.  Educated on hemi-technique with dressing with pt able to thread BUE into shirt, then requiring assist to pull shirt over head.  Utilized figure 4 positioning for LB dressing with pt able to thread BLE into shorts.  Donned Rt sock and shoe in figure 4 position and attempted Lt shoe, however met with increased difficulty as in Rt visual field and required trunk rotation to reach Lt foot.  Sit > stand mod assist with pt pulling shorts over Lt hip, requiring assist to pull shorts over Rt.  Squat pivot transfer to Rt with mod assist.  Engaged in visual scanning activity to Rt, utilizing Stroop test cards.  Pt able to locate cards in Rt visual field and read color written on card, increased difficulty identifying colored ink.  Pt able to identify when given choice of 3 to choose from.  [name] present throughout session and providing encouragement, as pt reporting "down" about situation and slow progress.  Pt much more  alert and engaged this session.  Therapy Documentation Precautions:  Precautions Precautions: Fall Precaution Comments: right hemiplegia and neglect, extensor tone in the left elbow and in the shoulder Restrictions Weight Bearing Restrictions: No Pain:   Pt with no c/o pain  See Function Navigator for Current Functional Status.   Therapy/Group: Individual Therapy  Simonne Come 04/30/2017, 9:50 AM

## 2017-04-30 NOTE — Progress Notes (Signed)
Physical Therapy Note  Patient Details  Name: OCEANE FOSSE MRN: [id number] Date of Birth: February 01, 1951 Today's Date: 04/30/2017    Time: 1400-1453 53 minutes  1:1 No c/o pain. Pt c/o fatigue but agreeable to treatment with encouragement.  Squat pivot transfers with mod/max A, increasing Rt LE extension tone.  Sit <> stand blocked practice with focus on Rt LE wt bearing to decrease tone.  Seated AAROM for Rt knee with focus on hamstring activation to decrease extensor tone.  Standing reaching task with min manual facilitation for balance and wt shifts, mod A for trunk and pelvic control.  W/c mobility with supervision in controlled environment, min A for obstacle negotiation. Pt incontinent of bowel, pt performs bridging for clothing management with mod/max manual facilitation.  Total A for hygiene.  Pt left in bed with needs at hand, alarm set.   [name],[name] 04/30/2017, 3:01 PM

## 2017-04-30 NOTE — Progress Notes (Signed)
Physical Therapy Session Note  Patient Details  Name: Ebony Williams MRN: [id number] Date of Birth: 1951-01-21  Today's Date: 04/30/2017 PT Individual Time: 1130-1157 PT Individual Time Calculation (min): 27 min   Short Term Goals: Week 3:  PT Short Term Goal 1 (Week 3): = LTG  Skilled Therapeutic Interventions/Progress Updates: Pt presented in recliner agreeable to therapy. PTA provided gentle ROM DF/PF to R foot prior to transfer.  Performed squat pivot to L modA w/c and pt able to scoot back for improved posture with min guard. Pt participated in w/c mobility in using with pt performing u-turns requiring no cues but increased time. As pt fatigued noted increased drift to R and increased cues for obstacle negotiation for R. Pt propelled approx 325ft total with intermittent rest breaks. Upon return to room pt requesting to return to bed. Encouraged pt to remain in w/c until after liunch as lunch cart should arrive shortly and pt agreeable. Pt left in w/c with half lap tray placed, QRB placed and call bell within reach.      Therapy Documentation Precautions:  Precautions Precautions: Fall Precaution Comments: right hemiplegia and neglect, extensor tone in the left elbow and in the shoulder Restrictions Weight Bearing Restrictions: No   See Function Navigator for Current Functional Status.   Therapy/Group: Individual Therapy  Ebony Williams  Janese Radabaugh, PTA  04/30/2017, 12:32 PM

## 2017-05-01 ENCOUNTER — Inpatient Hospital Stay (HOSPITAL_COMMUNITY): Payer: Medicare Other | Admitting: Occupational Therapy

## 2017-05-01 ENCOUNTER — Inpatient Hospital Stay (HOSPITAL_COMMUNITY): Payer: Medicare Other | Admitting: Physical Therapy

## 2017-05-01 ENCOUNTER — Inpatient Hospital Stay (HOSPITAL_COMMUNITY): Payer: Medicare Other

## 2017-05-01 DIAGNOSIS — E871 Hypo-osmolality and hyponatremia: Secondary | ICD-10-CM

## 2017-05-01 DIAGNOSIS — R0989 Other specified symptoms and signs involving the circulatory and respiratory systems: Secondary | ICD-10-CM

## 2017-05-01 LAB — GLUCOSE, CAPILLARY
GLUCOSE-CAPILLARY: 109 mg/dL — AB (ref 65–99)
GLUCOSE-CAPILLARY: 110 mg/dL — AB (ref 65–99)
GLUCOSE-CAPILLARY: 145 mg/dL — AB (ref 65–99)
Glucose-Capillary: 143 mg/dL — ABNORMAL HIGH (ref 65–99)

## 2017-05-01 MED ORDER — POTASSIUM CHLORIDE CRYS ER 20 MEQ PO TBCR
30.0000 meq | EXTENDED_RELEASE_TABLET | Freq: Every day | ORAL | Status: DC
  Administered 2017-05-01 – 2017-05-08 (×8): 30 meq via ORAL
  Filled 2017-05-01 (×7): qty 1

## 2017-05-01 MED ORDER — POTASSIUM CHLORIDE CRYS ER 20 MEQ PO TBCR: 30.0000 meq | EXTENDED_RELEASE_TABLET | Freq: Every day | ORAL | Status: DC

## 2017-05-01 NOTE — Progress Notes (Signed)
Physical Therapy Note  Patient Details  Name: ELADIA FRAME MRN: [id number] Date of Birth: Sep 27, 1950 Today's Date: 05/01/2017    Time: 1115-1155 40 minutes  1:1 No c/o pain.  Pt performs standing mini squats at parallel bars with mod manual facilitation for Rt knee control, improving Rt LE strength.  Standing pre gait stepping with LT LE with mod/max manual facilitation at Rt hip and knee for posture and to decrease knee hyperextension.  Pt also requiring assistance to prevent ankle eversion due to increasing tone.  Standing reaching task with pt with much improved Rt LE activation and able to follow directions with supervision cues.  kinetron for continued LE strengthening 3 x 1 minute. Pt left in room with needs at hand, quick release belt in place.   Amin Fornwalt 05/01/2017, 1:52 PM

## 2017-05-01 NOTE — Progress Notes (Addendum)
[facility]     PROGRESS NOTE  Subjective/Complaints:  Patient seen lying in bed this morning. She states she slept well overnight. She is more alert this morning.  ROS: Denies CP, SOB, N/V/D  Objective: Vital Signs: Blood pressure (!) 151/69, pulse 62, temperature 98 F (36.7 C), temperature source Axillary, resp. rate 20, height 5\' 1"  (1.549 m), weight 76.5 kg (168 lb 10.4 oz), SpO2 97 %. No results found. Recent Labs    04/30/17 0924  WBC 8.4  HGB 15.6*  HCT 44.5  PLT 267   Recent Labs    04/30/17 0924  NA 134*  K 3.4*  CL 99*  GLUCOSE 220*  BUN 16  CREATININE 0.73  CALCIUM 9.1   CBG (last 3)  Recent Labs    04/30/17 1628 04/30/17 2136 05/01/17 0643  GLUCAP 94 152* 143*    Wt Readings from Last 3 Encounters:  04/18/17 76.5 kg (168 lb 10.4 oz)  04/06/17 74.5 kg (164 lb 3.9 oz)  02/28/17 79.4 kg (175 lb)    Physical Exam:  BP (!) 151/69 (BP Location: Left Arm)   Pulse 62   Temp 98 F (36.7 C) (Axillary)   Resp 20   Ht 5\' 1"  (1.549 m)   Wt 76.5 kg (168 lb 10.4 oz)   SpO2 97%   BMI 31.87 kg/m   Constitutional: No distress . Vital signs reviewed. HENT: Normocephalic, atraumatic. Eyes: EOMI, No discharge. Cardiovascular: RRR. No JVD    Respiratory: CTA Bilaterally. Normal effort    GI: BS +, non-distended  Musculoskeletal: No edema or tenderness in extremities  Neurological: Alert and oriented 1 Dysphonia Motor: LUE 4-4+/5 proximal to distal LLE: 4/5 hip flexion, 4+/5 knee extension, 4+/5 ADF RUE/RLE: 0/5 proximal to distal (unchanged) Increase in tone noted Skin: Skin is warm and dry.  Psychiatric: Flat  Assessment/Plan: 1. 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 Physiatrist is providing close team supervision and 24 hour management of active medical problems listed  below. Physiatrist and rehab team continue to assess barriers to discharge/monitor patient progress toward functional and medical goals.  Function:  Bathing Bathing position   Position: Sitting EOB  Bathing parts Body parts bathed by patient: Right arm, Chest, Abdomen, Right upper leg, Left upper leg, Right lower leg, Left lower leg Body parts bathed by helper: Left arm, Front perineal area, Buttocks, Back  Bathing assist Assist Level: (Mod assist)      Upper Body Dressing/Undressing Upper body dressing   What is the patient wearing?: Pull over shirt/dress     Pull over shirt/dress - Perfomed by patient: Thread/unthread right sleeve, Thread/unthread left sleeve Pull over shirt/dress - Perfomed by helper: Put head through opening, Pull shirt over trunk        Upper body assist Assist Level: (Mod assist)      Lower Body Dressing/Undressing Lower body dressing   What is the patient wearing?: Pants, Socks, Shoes     Pants- Performed by patient: Thread/unthread right pants leg, Thread/unthread left pants leg Pants- Performed by helper: Pull pants up/down   Non-skid slipper socks- Performed by helper: Don/doff right sock, Don/doff left sock Socks - Performed by patient: Don/doff right sock Socks - Performed by helper: Don/doff left sock Shoes - Performed by patient: Don/doff right shoe Shoes - Performed by helper: Don/doff left shoe, Fasten right, Fasten left  Lower body assist Assist for lower body dressing: (Max assist)      Toileting Toileting Toileting activity did not occur: No continent bowel/bladder event   Toileting steps completed by helper: Performs perineal hygiene, Adjust clothing prior to toileting, Adjust clothing after toileting    Toileting assist Assist level: Touching or steadying assistance (Pt.75%)   Transfers Chair/bed transfer   Chair/bed transfer method: Squat pivot Chair/bed transfer assist level: Moderate assist (Pt 50 - 74%/lift or  lower) Chair/bed transfer assistive device: Armrests Mechanical lift: Ecologist Ambulation activity did not occur: Safety/medical concerns   Max distance: 10 Assist level: Maximal assist (Pt 25 - 49%)   Wheelchair Wheelchair activity did not occur: Safety/medical concerns Type: Manual Max wheelchair distance: 150 ft Assist Level: Touching or steadying assistance (Pt > 75%)  Cognition Comprehension Comprehension assist level: Understands basic 75 - 89% of the time/ requires cueing 10 - 24% of the time, Understands basic 50 - 74% of the time/ requires cueing 25 - 49% of the time  Expression Expression assist level: Expresses basic 50 - 74% of the time/requires cueing 25 - 49% of the time. Needs to repeat parts of sentences., Expresses basic 75 - 89% of the time/requires cueing 10 - 24% of the time. Needs helper to occlude trach/needs to repeat words.  Social Interaction Social Interaction assist level: Interacts appropriately 50 - 74% of the time - May be physically or verbally inappropriate., Interacts appropriately 75 - 89% of the time - Needs redirection for appropriate language or to initiate interaction.  Problem Solving Problem solving assist level: Solves basic 25 - 49% of the time - needs direction more than half the time to initiate, plan or complete simple activities, Solves basic 50 - 74% of the time/requires cueing 25 - 49% of the time  Memory Memory assist level: Recognizes or recalls 50 - 74% of the time/requires cueing 25 - 49% of the time    Medical Problem List and Plan: 1.  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  DVT Prophylaxis/Anticoagulation: SCDs. Monitor for any signs of DVT 3. Pain Management: Tylenol as needed 4. Mood: Effexor 75 mg daily, Xanax 0.5 mg 3 times a day as needed   Amantadine 100 mg with  breakfast and lunch started on 3/9, increased to 200 on 3/20.  5. Neuropsych: This patient is not capable of making decisions on his own behalf. 6. Skin/Wound Care: Routine skin checks 7. Fluids/Electrolytes/Nutrition: Routine I&O's 8. Hypertension.    HCTZ 12.5 mg daily. Changed to [place] 25 mg on 3/8 given increase in BUN/CR   Avapro 300 mg daily.    Norvasc 5 mg started on 3/21   Labile on 3/26, will consider medication adjustments tomorrow if persistently elevated 9. Dysphagia. Diet advanced to D2/thins 10. Prediabetes:    CBGs before meals and at bedtime    Added sliding scale insulin for covg   Metformin 250 started on 3/20, increased to 500 on 3/25   Labile on 3/26 11. Transaminitis: Resolved   Continue to monitor 12. Leukocytosis: Resolved   WBCs 8.4 on 3/25   UA equivocal, Ucx no growth   CXR  Stable   Afebrile  13. Spasticity:   increasing tone RUE/RLE   continue ROM/splinting   Given sedation, stopped baclofen after 2 doses 14. Hypokalemia  K+ 3.4 on 3/25   Daily supplementation increased on 3/26 15. Hyponatremia   Potassium 134 on 3/25   Continue to monitor  LOS (Days) 20 A FACE TO FACE EVALUATION WAS PERFORMED  Takyra Cantrall [name] 05/01/2017 8:17 AM

## 2017-05-01 NOTE — Progress Notes (Signed)
Occupational Therapy Session Note  Patient Details  Name: Ebony Williams MRN: [id number] Date of Birth: [birth year]  Today's Date: 05/01/2017 OT Individual Time: 1335-1435 OT Individual Time Calculation (min): 60 min    Short Term Goals: Week 3:  OT Short Term Goal 1 (Week 3): Pt will complete toilet transfer with mod assist of 1 caregiver OT Short Term Goal 2 (Week 3): Pt will complete LB dressing with max assist of one caregiver OT Short Term Goal 3 (Week 3): Pt will complete bathing with min assist OT Short Term Goal 4 (Week 3): Pt will complete UB dressing with min assist  Skilled Therapeutic Interventions/Progress Updates:    Pt seen for OT session focusing on functional transfers and neuro re-ed with weightbearing and attention to R. Pt sitting up in w/c upon arrival, voicing increased fatigue, however, willing to participate as able in therapy session, denied pain this afternoon. Completed stand pivot transfer w/c > elevated BSC over toilet. Pt required max A for transfer due to L LE extensor tone and poor static/sdyanmic standing balance. Pt with brief soiled of urine, when asked if she was aware of incontinent episode, pt stated "yes, but I didn't think anyone needed to know about it". Provided education regarding importance of maintaining skin integrity and hygiene. STEDY used for sit >stand from toilet for hygiene to be completed, pt able to assist with hygiene and pulling pants up while standing in STEDY, R UE placed on grab bar to assist with balance. Assist provided for thoroughness of hygiene and advancing pants over R hip.  She returned to w/c, and taken to therapy gym total A for time and energy conservation. Completed mod A squat pivot transfers throughout session with multimodal cuing for technique.  Seated EOM, R UE placed in weightbearing position. Pt required to obtain beanbag on R, identify color and sort by color. Pt able to accuratly sort beanbags by color, however,  required max cuing for word finding of colors. She was then required to name everyday object of the color of the bean bag, total A for coming up with abstract word. She was required to reach across midline to R in order to place beanbag to facilitate weightbearing through R UE and attention to R.  Pt returned to room at end of session, mod A squat pivot transfer to bed. Pt left in supine at end of session, all needs in reach and bed alarm on.    Therapy Documentation Precautions:  Precautions Precautions: Fall Precaution Comments: right hemiplegia and neglect, extensor tone in the left elbow and in the shoulder Restrictions Weight Bearing Restrictions: No Pain:   No/denies pain  See Function Navigator for Current Functional Status.   Therapy/Group: Individual Therapy  Talbert Trembath L 05/01/2017, 2:44 PM

## 2017-05-01 NOTE — Progress Notes (Signed)
Occupational Therapy Session Note  Patient Details  Name: Ebony Williams MRN: [id number] Date of Birth: 10-Aug-1950  Today's Date: 05/01/2017 OT Individual Time: 5366-4403 OT Individual Time Calculation (min): 57 min    Short Term Goals: Week 3:  OT Short Term Goal 1 (Week 3): Pt will complete toilet transfer with mod assist of 1 caregiver OT Short Term Goal 2 (Week 3): Pt will complete LB dressing with max assist of one caregiver OT Short Term Goal 3 (Week 3): Pt will complete bathing with min assist OT Short Term Goal 4 (Week 3): Pt will complete UB dressing with min assist  Skilled Therapeutic Interventions/Progress Updates:    Treatment session with focus on ADL retraining with increased participation in bathing and dressing tasks.  Pt received awake/alert in bed with daughter present.  Pt willing to engage in bathing at shower level.  Min assist bed mobility to come to EOB, therapist assisting with advancing RLE.  Utilized Stedy for transfer to tub bench in room shower, pt completed sit > stand with min assist.  Pt with increased participation during bathing this session, utilizing figure 4 position to wash lower legs and able to wash perineal area this session with weight shifts and figure 4 position.  Therapist blocked RLE for positioning due to tone with sit <> stand to allow therapist to wash buttocks.  Transferred tub bench to [facility] with Stedy as pt reports need to have BM.  Pt incontinent prior to transfer, however finished while on toilet.  Sit <> stand with mod assist with therapist providing support to RLE for positioning and control due to tone during LB hygiene and when pulling pants over hips.  Pt donned shorts utilizing figure 4 position and pulled Lt side of shorts up in standing, therapist assisting with Rt side.  Pt donned gripper socks in sitting in figure 4 position.  Pt left upright in w/c with quick release belt intact and half lap tray on for positioning of RUE.  Therapy  Documentation Precautions:  Precautions Precautions: Fall Precaution Comments: right hemiplegia and neglect, extensor tone in the left elbow and in the shoulder Restrictions Weight Bearing Restrictions: No Pain:  Pt with no c/o pain  See Function Navigator for Current Functional Status.   Therapy/Group: Individual Therapy  Simonne Come 05/01/2017, 9:32 AM

## 2017-05-01 NOTE — Progress Notes (Signed)
Speech Language Pathology Daily Session Note  Patient Details  Name: ADLEY CASTELLO MRN: [id number] Date of Birth: 01-02-1951  Today's Date: 05/01/2017 SLP Individual Time: 1030-1100 SLP Individual Time Calculation (min): 30 min  Short Term Goals: Week 3: SLP Short Term Goal 1 (Week 3): Pt will verbally convey her needs and wants to caregivers at the word level in 75% of opportunities with min assist verbal cues. SLP Short Term Goal 2 (Week 3): Pt will name basic, familiar objects for 75% accuracy with min assist verbal cues for word finding.  SLP Short Term Goal 3 (Week 3): Pt will locate items to the right of midline during basic, familiar tasks in 75% of opportunities with supervision verbal cues.  SLP Short Term Goal 4 (Week 3): Pt will consume dys 2 textures and nectar thick liquids with supervision cues for use of swallowing precautions.  SLP Short Term Goal 5 (Week 3): Pt will sustain her attention to basic, familiar tasks for 10 minute intervals with min assist verbal cues for redirection.   SLP Short Term Goal 6 (Week 3): Pt will consume therapeutic trials of thin liquids with supervision verbal cues for use of swallowing precautions.    Skilled Therapeutic Interventions:Skilled ST services focused on  Swallow and speech skills. SLP facilitated PO consumption of thin via cup, pt demonstrated swallow strategies, however mixed up right vs. Left in verbal statement but utilized correct side during demonstration, pt utilized right head turn with 4 oz water via cup with no overt s/s aspiration in non-distracting environment. Pt demonstrated speech at word level when ordering lunch and dinner items, requiring supervision A verbal cues for intellgibility and in conversation at phrase level with 75% accuracy min A verbal cues for word finding. Pt required no redirection for sustained attention in non-distracting environment during 30 minute session. Pt was left in room with call bell within reach.  Reccomend to continue skilled ST services.         Function:  Eating Eating   Modified Consistency Diet: No Eating Assist Level: More than reasonable amount of time;Supervision or verbal cues           Cognition Comprehension Comprehension assist level: Understands basic 75 - 89% of the time/ requires cueing 10 - 24% of the time  Expression   Expression assist level: Expresses basic 75 - 89% of the time/requires cueing 10 - 24% of the time. Needs helper to occlude trach/needs to repeat words.  Social Interaction    Problem Solving Problem solving assist level: Solves basic 50 - 74% of the time/requires cueing 25 - 49% of the time  Memory Memory assist level: Recognizes or recalls 50 - 74% of the time/requires cueing 25 - 49% of the time    Pain    Therapy/Group: Individual Therapy  Kalonji Zurawski  [facility] 05/01/2017, 12:55 PM

## 2017-05-01 NOTE — NC FL2 (Addendum)
[place] LEVEL OF CARE SCREENING TOOL     IDENTIFICATION  Patient Name: Ebony Williams Birthdate: [birth year] Sex: female Admission Date (Current Location): 04/11/2017  [facility] and [place] Number:  [employer] and Address:  [facility] [place], [place] [address], [place], [place] [address]      Provider Number: [id number]  Attending Physician Name and Address:  [name], MD  Relative Name and Phone Number:       Current Level of Care: Other (Comment)(Acute Inpatient Rehabilitation) Recommended Level of Care: [place] Prior Approval Number:    Date Approved/Denied:   PASRR Number:  [id number]  Discharge Plan: SNF    Current Diagnoses: Patient Active Problem List   Diagnosis Date Noted  . Hyponatremia   . Labile blood pressure   . Hypokalemia   . Elevated BUN   . Lethargy   . Transaminitis   . Essential hypertension   . Right hemiplegia ([place])   . Hypertensive crisis   . 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 Depression   . Dysphagia, post-stroke   . Diastolic dysfunction   . Benign essential HTN   . Prediabetes   . Leukocytosis   . Cerebral infarction due to occlusion of left middle cerebral artery ([place]) s/p IV tPA 04/06/2017  . Stroke (cerebrum) ([place]) 04/06/2017  . 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 Pain in joint, shoulder region 10/18/2011    Orientation RESPIRATION BLADDER Height & Weight     Self, Situation, Place  Normal, Other (Comment)(was using CPAP at night prior to admission) Incontinent Weight: 76.5 kg (168 lb 10.4 oz) Height:  5\' 1"  (154.9 cm)  BEHAVIORAL SYMPTOMS/MOOD NEUROLOGICAL BOWEL NUTRITION STATUS      Incontinent Diet(Dysphagia 2 with thin liquids with supervision)  AMBULATORY STATUS COMMUNICATION OF NEEDS Skin   Extensive Assist(mod assist) Verbally Other (Comment)(skin tear left forearm)                       Personal Care Assistance Level of Assistance   Bathing, Feeding, Dressing Bathing Assistance: Limited assistance(min to mod assist) Feeding assistance: Limited assistance(dysphagia; supervision by RN, SLP, or dtr only for thin liquids and D2 diet) Dressing Assistance: Limited assistance(min to mod assist)     Functional Limitations Info  Speech     Speech Info: Impaired    SPECIAL CARE FACTORS FREQUENCY  Blood pressure, PT (By licensed PT), OT (By licensed OT), Bowel and bladder program, Restorative feeding program, Speech therapy Blood Pressure Frequency: daily and PRN   PT Frequency: 5-7x/week OT Frequency: 5-7x/week Bowel and Bladder Program Frequency: timed toileting every 2-3 hours Restorative Feeding Program Frequency: advance pt's D2 diet as pt can tolerate Speech Therapy Frequency: 5-7x/week      Contractures Contractures Info: Not present    Additional Factors Info  Allergies, Code Status Code Status Info: full code Allergies Info: sulfa antibiotics           Current Medications (05/01/2017):  This is the current hospital active medication list Current Facility-Administered Medications  Medication Dose Route Frequency Provider Last Rate Last Dose  . acetaminophen (TYLENOL) solution 650 mg  650 mg Oral Q4H PRN [name], MD       Or  . acetaminophen (TYLENOL) tablet 650 mg  650 mg Oral Q4H PRN [name], MD       Or  . acetaminophen (TYLENOL) suppository 650 mg  650 mg Rectal Q4H PRN  [name], MD      . ALPRAZolam Duanne Moron) tablet 0.5 mg  0.5 mg Oral TID PRN [name], MD      . amantadine (SYMMETREL) capsule 200 mg  200 mg Oral BID [name], MD   200 mg at 04/30/17 1205  . amLODipine (NORVASC) tablet 5 mg  5 mg Oral Daily [name], MD   5 mg at 04/30/17 0920  . aspirin chewable tablet 81 mg  81 mg Oral Daily [name], MD   81 mg at 04/30/17 0920  . chlorthalidone (HYGROTON) tablet 25 mg  25 mg Oral Daily Meredith Staggers, MD   25 mg at 04/30/17 0920   . cloNIDine (CATAPRES) tablet 0.1 mg  0.1 mg Oral Q6H PRN Bary Leriche, PA-C   0.1 mg at 04/11/17 2320  . clopidogrel (PLAVIX) tablet 75 mg  75 mg Oral Daily Cathlyn Parsons, PA-C   75 mg at 04/30/17 0941  . insulin aspart (novoLOG) injection 0-9 Units  0-9 Units Subcutaneous TID WC Meredith Staggers, MD   2 Units at 04/30/17 1205  . irbesartan (AVAPRO) tablet 300 mg  300 mg Oral Daily Cathlyn Parsons, PA-C   300 mg at 04/30/17 6503  . metFORMIN (GLUCOPHAGE) tablet 500 mg  500 mg Oral Q breakfast [name], MD   500 mg at 04/30/17 0926  . ondansetron (ZOFRAN) tablet 4 mg  4 mg Oral Q6H PRN Angiulli, Lavon Paganini, PA-C       Or  . ondansetron [facility]) injection 4 mg  4 mg Intravenous Q6H PRN Angiulli, Lavon Paganini, PA-C      . pantoprazole (PROTONIX) EC tablet 40 mg  40 mg Oral Daily [name], MD   40 mg at 04/30/17 2052  . potassium chloride (K-DUR,KLOR-CON) CR tablet 30 mEq  30 mEq Oral Daily [name], MD      . RESOURCE THICKENUP CLEAR   Oral PRN Angiulli, Lavon Paganini, PA-C      . senna-docusate (Senokot-S) tablet 1 tablet  1 tablet Oral QHS PRN Cathlyn Parsons, PA-C   1 tablet at 04/13/17 0030  . sorbitol 70 % solution 30 mL  30 mL Oral Daily PRN Angiulli, Lavon Paganini, PA-C      . venlafaxine XR (EFFEXOR-XR) 24 hr capsule 75 mg  75 mg Oral Daily AngiulliLavon Paganini, PA-C   75 mg at 04/30/17 0920     Discharge Medications: Please see discharge summary for a list of discharge medications.  Relevant Imaging Results:  Relevant Lab Results:   Additional Information SSN: [id number]  Norell Brisbin, [name], LCSW

## 2017-05-02 ENCOUNTER — Encounter (HOSPITAL_COMMUNITY): Payer: Medicare Other | Admitting: Psychology

## 2017-05-02 ENCOUNTER — Inpatient Hospital Stay (HOSPITAL_COMMUNITY): Payer: Medicare Other | Admitting: Speech Pathology

## 2017-05-02 ENCOUNTER — Inpatient Hospital Stay (HOSPITAL_COMMUNITY): Payer: Medicare Other | Admitting: Occupational Therapy

## 2017-05-02 ENCOUNTER — Inpatient Hospital Stay (HOSPITAL_COMMUNITY): Payer: Medicare Other | Admitting: Physical Therapy

## 2017-05-02 LAB — GLUCOSE, CAPILLARY
GLUCOSE-CAPILLARY: 107 mg/dL — AB (ref 65–99)
GLUCOSE-CAPILLARY: 118 mg/dL — AB (ref 65–99)
GLUCOSE-CAPILLARY: 126 mg/dL — AB (ref 65–99)
Glucose-Capillary: 95 mg/dL (ref 65–99)

## 2017-05-02 MED ORDER — AMLODIPINE BESYLATE 10 MG PO TABS
10.0000 mg | ORAL_TABLET | Freq: Every day | ORAL | Status: DC
  Administered 2017-05-02 – 2017-05-08 (×7): 10 mg via ORAL
  Filled 2017-05-02 (×6): qty 1

## 2017-05-02 NOTE — Progress Notes (Addendum)
Physical Therapy Note  Patient Details  Name: Ebony Williams MRN: [id number] Date of Birth: 01/04/1951 Today's Date: 05/02/2017    Time: 1000-1055 55 minutes  1:1 No c/o pain.  Squat pivot transfers throughout session mod A, manual facilitation for wt shifts laterally, pt improved with forward wt shift.  Supine NMR with PNF diagonals with resistance, bridging with manual facilitation at glutes, LTR to help reduce tone.  Pt with improving Rt LE strength, still with eversion tone in Rt ankle.  Sit to stand with wt shifting and mini squats with manual facilitation to prevent Rt knee hyperextension and to place ankle in neutral position.  Pt requires assist for Rt hip anterior wt shifts and for Rt knee control with squats.  Attempt gait with eva walker. Pt with increased pain in Rt UE with placement in eva walker despite multiple attempts at different positions and stretching to Rt UE.  Standing with LT UE only on eva walker pt able to take steps fwd/backward with Lt LE with improving hip control in Rt stance phase.  Pt propels w/c to room with initial min A, fading to supervision.  Pt reports dizziness with supine <> sit.  Vision assessed and pt reporting double vision with Rt gaze.  Pt left with needs at hand, quick release belt donned.   Daviana Haymaker 05/02/2017, 10:56 AM

## 2017-05-02 NOTE — Progress Notes (Signed)
Occupational Therapy Session Note  Patient Details  Name: Ebony Williams MRN: [id number] Date of Birth: Jan 26, 1951  Today's Date: 05/02/2017 OT Individual Time: 6387-5643 OT Individual Time Calculation (min): 75 min    Short Term Goals: Week 3:  OT Short Term Goal 1 (Week 3): Pt will complete toilet transfer with mod assist of 1 caregiver OT Short Term Goal 2 (Week 3): Pt will complete LB dressing with max assist of one caregiver OT Short Term Goal 3 (Week 3): Pt will complete bathing with min assist OT Short Term Goal 4 (Week 3): Pt will complete UB dressing with min assist  Skilled Therapeutic Interventions/Progress Updates:    Pt seen this session for NMR to focus on tone reduction, balance in sitting and standing, forward wt shifts with sit to stand.  [name] present for the majority of the session.  To begin therapy, pt placed in supine with HOB down.  Demonstration to dtr on lateral knee sways to decrease tone and improve torso mobility, then PROM to scapula followed by slow progressive proximal to distal PROM to reduce UE tone and achieve full ROM.  Pt did not have pain with this stretching.  She rolled fully onto L side to sit to EOB. From sitting, torso rotation followed by lateral weight shifts.  Sit >< stand from EOB with a focus on forward wt shifts with stabilization through LLE.  No extensor tone felt in leg this am, but pt has limited PROM of dorsiflexion.  Pt had an incontinent bowel in her brief. Pt transferred to w/c with sq pivot with mod A to L.  Sit to stand at sink with mod A and standing balance at sink with mod A to clean up from incontinent episode. Pt was able to stand and use L hand to cleanse front area and pull pants over hips 75% of the way.  In standing, facilitation for knee extension with limited dorsiflexion.  After pt finished changing pants, donning socks and shoes, and brushing teeth, worked on deep muscle massage over calf and achilles tendon and pt then  was able to achieve further dorsiflexion.  Pt in w/c with lap tray and quick release belt resting for her next therapy session.    Therapy Documentation Precautions:  Precautions Precautions: Fall Precaution Comments: right hemiplegia and neglect, extensor tone in the left elbow and in the shoulder Restrictions Weight Bearing Restrictions: No  Pain: no c/o pain   ADL:    See Function Navigator for Current Functional Status.   Therapy/Group: Individual Therapy  [place] 05/02/2017, 12:15 PM

## 2017-05-02 NOTE — Progress Notes (Signed)
Orthopedic Tech Progress Note Patient Details:  Ebony Williams December 17, 1950 [id number] [employer] called and they do not do serial casting. Patient ID: [name], female   DOB: 1950/03/20, 67 y.o.   MRN: [id number]   Braulio Bosch 05/02/2017, 4:43 PM

## 2017-05-02 NOTE — Progress Notes (Signed)
Orthopedic Tech Progress Note Patient Details:  Ebony Williams [facility] 1950-06-21 [id number] Called [employer] for brace. Patient ID: Ebony Williams, female   DOB: Apr 28, 1950, 67 y.o.   MRN: [id number]   Braulio Bosch 05/02/2017, 3:02 PM

## 2017-05-02 NOTE — Progress Notes (Addendum)
Speech Language Pathology Daily Session Note  Patient Details  Name: Ebony Williams MRN: [id number] Date of Birth: Jul 21, 1950  Today's Date: 05/02/2017 SLP Individual Time: 1300-1400 SLP Individual Time Calculation (min): 60 min  Short Term Goals: Week 3: SLP Short Term Goal 1 (Week 3): Pt will verbally convey her needs and wants to caregivers at the word level in 75% of opportunities with min assist verbal cues. SLP Short Term Goal 2 (Week 3): Pt will name basic, familiar objects for 75% accuracy with min assist verbal cues for word finding.  SLP Short Term Goal 3 (Week 3): Pt will locate items to the right of midline during basic, familiar tasks in 75% of opportunities with supervision verbal cues.  SLP Short Term Goal 4 (Week 3): Pt will consume dys 2 textures and nectar thick liquids with supervision cues for use of swallowing precautions.  SLP Short Term Goal 5 (Week 3): Pt will sustain her attention to basic, familiar tasks for 10 minute intervals with min assist verbal cues for redirection.   SLP Short Term Goal 6 (Week 3): Pt will consume therapeutic trials of thin liquids with supervision verbal cues for use of swallowing precautions.    Skilled Therapeutic Interventions:   Pt was seen for skilled ST targeting goals for communication and dysphagia.  SLP facilitated the session with skilled observations completed during presentations of thin liquids.  Pt needed min assist verbal cues to recall head turn to the right but in practice could use posture in 100% of opportunities with supervision.  X1 immediate cough noted, suspect due to rate.  Otherwise, no overt s/s of aspiration were evident and current diet remains appropriate.   Therapist initiated RMST evaluation given that pt still has hoarse, breathy, vocal quality even though mentation and alertness is substantially improved.  Pt's peak MIP was [facility] for age matched peers but MEP was below normal.  EMST was initiated and pt was able to  complete 25 repetitions at 20 cm H2O with a self perceived effort level of 6 out of 10.   SLP also facilitated the session with verbal picture description tasks to expand length of utterance.  Pt could describe safety concerns in pictures with min assist verbal cues for word finding.  Pt was returned to room and left in bed with bed alarm set and call bell within reach.  Continue per current plan of care.     Function:  Eating Eating   Modified Consistency Diet: Yes Eating Assist Level: Supervision or verbal cues           Cognition Comprehension Comprehension assist level: Understands basic 90% of the time/cues < 10% of the time  Expression   Expression assist level: Expresses basic 75 - 89% of the time/requires cueing 10 - 24% of the time. Needs helper to occlude trach/needs to repeat words.  Social Interaction Social Interaction assist level: Interacts appropriately 90% of the time - Needs monitoring or encouragement for participation or interaction.  Problem Solving Problem solving assist level: Solves basic 75 - 89% of the time/requires cueing 10 - 24% of the time  Memory Memory assist level: Recognizes or recalls 50 - 74% of the time/requires cueing 25 - 49% of the time    Pain Pain Assessment Pain Scale: 0-10 Pain Score: 0-No pain Faces Pain Scale: No hurt  Therapy/Group: Individual Therapy  Urijah Arko, Selinda Orion 05/02/2017, 4:16 PM

## 2017-05-02 NOTE — Consult Note (Signed)
Neuropsychological Consultation   Patient:   Ebony Williams   DOB:   1950-03-28  MR Number:  [id number]  Location:  [place] [address] [facility] [address] [id number] [place] [address] Dept: [place]: [phone]           Date of Service:   05/02/2017  Start Time:   11 AM End Time:   12 PM  Provider/Observer:  Ilean Skill, Psy.D.       Clinical Neuropsychologist       Billing Code/Service: [phone] 4 Units  Chief Complaint:    Ebony Williams is a 67 year old female with history of hypertension.  Presented 04/06/2017 with right sided weakness, left gaze and aphasia.  Head CT revealed left basal ganglia infarct nonhemorrhagic.  Patient received TPA.  Patient has continued with right sided motor deficits and expressive language deficits.  Patient was admitted for CRI program.  Patient had prior history of depression and current residual deficits have exacerbated these pre-existing symptoms.  Reason for Service:  Ebony Williams was referred for neuropsychological/psychological consultation due to depressive and coping issues.  Below is the HPI for the current admission.  HPI: [place] 67 y.o.right handed femalewith history of hypertension. Per chart reviewand husband,patient lives with spouse. Reported to be independent prior to admission. 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 One level home with 3 steps to entry. Family in the area question 24 hour assistance. Presented 04/06/2017 with right sided weakness, left gaze and aphasia. CT head reviewed, showing left basal ganglia infarct. Per report, subacute left-sided basal ganglia nonhemorrhagic infarction. Patient did receive TPA. CT angiogram head and neck with no hemodynamically significant stenosis or acute vascular process. 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 Echocardiogram with ejection fraction of 46% grade 1 diastolic dysfunction without embolism. Neurology consulted presently on aspirin as well as Plavix for CVA prophylaxis. Dysphagia #1 nectar thick liquid diet. Physical occupational therapy evaluations completed 04/09/2017 with recommendations of physical medicine rehabilitation consult. Patient was admitted for a comprehensive rehabilitation program  Current Status:  Patient reports that depression and adjustment issues are present and the expressive language deficits are a major part of her stress/frustation.  She has good receptive language abilities and is able to effective think and process what she wants to say but word finding and word fluency are primary difficulties even more than motor deficits.  However, continued motor deficits are resulting in her loss of independence, which is also hard for her to cope with.  Behavioral Observation: Ebony Williams  presents as a 67 y.o.-year-old Right Caucasian Female who appeared her stated age. her dress was Appropriate and she was Well Groomed and her manners were Appropriate to the situation.  her participation was indicative of Appropriate and Attentive behaviors.  There were physical disabilities noted.  she displayed an appropriate level of cooperation and motivation.     Interactions:    Active Appropriate and Attentive  Attention:   within normal limits and attention span and concentration were age appropriate  Memory:   within normal limits; recent and remote memory intact  Visuo-spatial:  not examined  Speech (Volume):  low  Speech:   non-fluent aphasia; Significant word finding and fluency issues noted.  Thought Process:  Coherent and Relevant  Though Content:  WNL; not suicidal  and not homicidal  Orientation:   person, place, time/date and situation  Judgment:   Good  Planning:   Good  Affect:    Depressed and  Flat  Mood:    Depressed  Insight:   Fair  Intelligence:   normal  Medical History:   Past Medical History:  Diagnosis Date  . History of kidney stones    x1  . Hypertension         Abuse/Trauma History:   Psychiatric History:  Prior history of depression.  Family Med/Psych History:  Family History  Problem Relation Age of Onset  . Diabetes Mother   . Hypertension Mother     Risk of Suicide/Violence: low Patient denies SI or HI  Impression/DX:  Ebony Williams is a 67 year old female with history of hypertension.  Presented 04/06/2017 with right sided weakness, left gaze and aphasia.  Head CT revealed left basal ganglia infarct nonhemorrhagic.  Patient received TPA.  Patient has continued with right sided motor deficits and expressive language deficits.  Patient was admitted for CRI program.  Patient had prior history of depression and current residual deficits have exacerbated these pre-existing symptoms.  Patient reports that depression and adjustment issues are present and the expressive language deficits are a major part of her stress/frustation.  She has good receptive language abilities and is able to effective think and process what she wants to say but word finding and word fluency are primary difficulties even more than motor deficits.  However, continued motor deficits are resulting in her loss of independence, which is also hard for her to cope with.  Diagnosis:    Cerebrovascular accident (CVA), unspecified mechanism ([place]) - Plan: irbesartan (AVAPRO) tablet 300 mg, ALPRAZolam (XANAX) tablet 0.5 mg, DISCONTINUED: ALPRAZolam (XANAX) tablet 0.5 mg, DISCONTINUED: hydrochlorothiazide (MICROZIDE) capsule 12.5 mg  Lethargy - Plan: DG Chest 2 View, DG Chest 2 View, CANCELED: DG Chest 1 View, CANCELED: DG Chest 1 View         Electronically Signed   _______________________ Ilean Skill, Psy.D.

## 2017-05-02 NOTE — Progress Notes (Signed)
[place] PHYSICAL MEDICINE & REHABILITATION     PROGRESS NOTE  Subjective/Complaints:  Patient seen lying in bed this morning. She states she slept well overnight. No reported issues overnight.  ROS: Denies CP, SOB, N/V/D  Objective: Vital Signs: Blood pressure (!) 155/70, pulse 68, temperature (!) 97.5 F (36.4 C), temperature source Oral, resp. rate 18, height 5\' 1"  (1.549 m), weight 76.5 kg (168 lb 10.4 oz), SpO2 97 %. No results found. Recent Labs    04/30/17 0924  WBC 8.4  HGB 15.6*  HCT 44.5  PLT 267   Recent Labs    04/30/17 0924  NA 134*  K 3.4*  CL 99*  GLUCOSE 220*  BUN 16  CREATININE 0.73  CALCIUM 9.1   CBG (last 3)  Recent Labs    05/01/17 1638 05/01/17 2105 05/02/17 0654  GLUCAP 110* 145* 107*    Wt Readings from Last 3 Encounters:  04/18/17 76.5 kg (168 lb 10.4 oz)  04/06/17 74.5 kg (164 lb 3.9 oz)  02/28/17 79.4 kg (175 lb)    Physical Exam:  BP (!) 155/70 (BP Location: Left Arm)   Pulse 68   Temp (!) 97.5 F (36.4 C) (Oral)   Resp 18   Ht 5\' 1"  (1.549 m)   Wt 76.5 kg (168 lb 10.4 oz)   SpO2 97%   BMI 31.87 kg/m   Constitutional: No distress . Vital signs reviewed. HENT: Normocephalic, atraumatic. Eyes: EOMI, No discharge. Cardiovascular: RRR. No JVD    Respiratory: CTA Bilaterally. Normal effort    GI: BS +, non-distended  Musculoskeletal: No edema or tenderness in extremities  Neurological: Somnolent and oriented 1 Dysphonia Motor: LUE 4-4+/5 proximal to distal LLE: 4/5 hip flexion, 4+/5 knee extension, 4+/5 ADF RUE/RLE: 0/5 proximal to distal (stable) Increase in tone noted Skin: Skin is warm and dry.  Psychiatric: Flat  Assessment/Plan: 1. 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 Physiatrist is providing close team supervision and 24 hour management of active medical problems  listed below. Physiatrist and rehab team continue to assess barriers to discharge/monitor patient progress toward functional and medical goals.  Function:  Bathing Bathing position   Position: Shower  Bathing parts Body parts bathed by patient: Right arm, Chest, Abdomen, Right upper leg, Left upper leg, Right lower leg, Left lower leg, Front perineal area Body parts bathed by helper: Left arm, Buttocks, Back  Bathing assist Assist Level: Touching or steadying assistance(Pt > 75%)      Upper Body Dressing/Undressing Upper body dressing   What is the patient wearing?: Pull over shirt/dress     Pull over shirt/dress - Perfomed by patient: Thread/unthread left sleeve, Put head through opening, Pull shirt over trunk Pull over shirt/dress - Perfomed by helper: Thread/unthread right sleeve        Upper body assist Assist Level: Touching or steadying assistance(Pt > 75%)      Lower Body Dressing/Undressing Lower body dressing   What is the patient wearing?: Pants, Non-skid slipper socks     Pants- Performed by patient: Thread/unthread right pants leg, Thread/unthread left pants leg Pants- Performed by helper: Pull pants up/down Non-skid slipper socks- Performed by patient: Don/doff right sock, Don/doff left sock Non-skid slipper socks- Performed by helper: Don/doff right sock, Don/doff left sock Socks - Performed by patient: Don/doff right sock Socks - Performed by helper: Don/doff left sock Shoes - Performed by patient:  Don/doff right shoe Shoes - Performed by helper: Don/doff left shoe, Fasten right, Fasten left          Lower body assist Assist for lower body dressing: (Min assist)      Toileting Toileting Toileting activity did not occur: No continent bowel/bladder event Toileting steps completed by patient: Performs perineal hygiene Toileting steps completed by helper: Adjust clothing prior to toileting, Adjust clothing after toileting Toileting Assistive Devices: Other  (comment)(Standing in [place])  Toileting assist Assist level: (Standing in [place])   Transfers Chair/bed transfer   Chair/bed transfer method: Squat pivot Chair/bed transfer assist level: Moderate assist (Pt 50 - 74%/lift or lower) Chair/bed transfer assistive device: Armrests Mechanical lift: Ecologist Ambulation activity did not occur: Safety/medical concerns   Max distance: 10 Assist level: Maximal assist (Pt 25 - 49%)   Wheelchair Wheelchair activity did not occur: Safety/medical concerns Type: Manual Max wheelchair distance: 150 ft Assist Level: Touching or steadying assistance (Pt > 75%)  Cognition Comprehension Comprehension assist level: Understands basic 75 - 89% of the time/ requires cueing 10 - 24% of the time  Expression Expression assist level: Expresses basic 75 - 89% of the time/requires cueing 10 - 24% of the time. Needs helper to occlude trach/needs to repeat words.  Social Interaction Social Interaction assist level: Interacts appropriately 50 - 74% of the time - May be physically or verbally inappropriate., Interacts appropriately 75 - 89% of the time - Needs redirection for appropriate language or to initiate interaction.  Problem Solving Problem solving assist level: Solves basic 50 - 74% of the time/requires cueing 25 - 49% of the time  Memory Memory assist level: Recognizes or recalls 50 - 74% of the time/requires cueing 25 - 49% of the time    Medical Problem List and Plan: 1.  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  DVT Prophylaxis/Anticoagulation: SCDs. Monitor for any signs of DVT 3. Pain Management: Tylenol as needed 4. Mood: Effexor 75 mg daily, Xanax 0.5 mg 3 times a day as needed   Amantadine 100 mg with breakfast and lunch started on 3/9, increased to 200 on 3/20.  5. Neuropsych: This patient  is not capable of making decisions on his own behalf. 6. Skin/Wound Care: Routine skin checks 7. Fluids/Electrolytes/Nutrition: Routine I&O's 8. Hypertension.    HCTZ 12.5 mg daily. Changed to [place] 25 mg on 3/8 given increase in BUN/CR   Avapro 300 mg daily.    Norvasc 5 mg started on 3/21, increased to 10 mg on 3/27 9. Dysphagia. Diet advanced to D2/thins 10. Prediabetes:    CBGs before meals and at bedtime    Added sliding scale insulin for covg   Metformin 250 started on 3/20, increased to 500 on 3/25   Slightly labile on 3/27 11. Transaminitis: Resolved   Continue to monitor 12. Leukocytosis: Resolved   WBCs 8.4 on 3/25   UA equivocal, Ucx no growth   CXR  Stable   Afebrile  13. Spasticity:   increasing tone RUE/RLE   continue ROM/splinting   Given sedation, stopped baclofen after 2 doses 14. Hypokalemia   K+ 3.4 on 3/25   Daily supplementation increased on 3/26 15. Hyponatremia   Potassium 134 on 3/25   Continue to monitor  LOS (Days) 21 A FACE TO FACE EVALUATION WAS PERFORMED  Ankit [name] 05/02/2017 8:25 AM

## 2017-05-03 ENCOUNTER — Inpatient Hospital Stay (HOSPITAL_COMMUNITY): Payer: Medicare Other | Admitting: Speech Pathology

## 2017-05-03 ENCOUNTER — Inpatient Hospital Stay (HOSPITAL_COMMUNITY): Payer: Medicare Other | Admitting: Occupational Therapy

## 2017-05-03 ENCOUNTER — Inpatient Hospital Stay (HOSPITAL_COMMUNITY): Payer: Medicare Other | Admitting: Physical Therapy

## 2017-05-03 DIAGNOSIS — F331 Major depressive disorder, recurrent, moderate: Secondary | ICD-10-CM

## 2017-05-03 DIAGNOSIS — I69351 Hemiplegia and hemiparesis following cerebral infarction affecting right dominant side: Secondary | ICD-10-CM

## 2017-05-03 LAB — GLUCOSE, CAPILLARY
GLUCOSE-CAPILLARY: 91 mg/dL (ref 65–99)
GLUCOSE-CAPILLARY: 106 mg/dL — AB (ref 65–99)
Glucose-Capillary: 132 mg/dL — ABNORMAL HIGH (ref 65–99)
Glucose-Capillary: 160 mg/dL — ABNORMAL HIGH (ref 65–99)

## 2017-05-03 NOTE — Progress Notes (Signed)
Speech Language Pathology Weekly Progress and Session Note  Patient Details  Name: Ebony Williams MRN: [id number] Date of Birth: [birth year]  Beginning of progress report period: April 26, 2017  End of progress report period: May 03, 2017   Today's Date: 05/03/2017 SLP Individual Time: 1000-1100 SLP Individual Time Calculation (min): 60 min  Short Term Goals: Week 3: SLP Short Term Goal 1 (Week 3): Pt will verbally convey her needs and wants to caregivers at the word level in 75% of opportunities with min assist verbal cues. SLP Short Term Goal 1 - Progress (Week 3): Met SLP Short Term Goal 2 (Week 3): Pt will name basic, familiar objects for 75% accuracy with min assist verbal cues for word finding.  SLP Short Term Goal 2 - Progress (Week 3): Progressing toward goal SLP Short Term Goal 3 (Week 3): Pt will locate items to the right of midline during basic, familiar tasks in 75% of opportunities with supervision verbal cues.  SLP Short Term Goal 3 - Progress (Week 3): Progressing toward goal SLP Short Term Goal 4 (Week 3): Pt will consume dys 2 textures and nectar thick liquids with supervision cues for use of swallowing precautions.  SLP Short Term Goal 4 - Progress (Week 3): Met SLP Short Term Goal 5 (Week 3): Pt will sustain her attention to basic, familiar tasks for 10 minute intervals with min assist verbal cues for redirection.   SLP Short Term Goal 5 - Progress (Week 3): Met SLP Short Term Goal 6 (Week 3): Pt will consume therapeutic trials of thin liquids with supervision verbal cues for use of swallowing precautions.   SLP Short Term Goal 6 - Progress (Week 3): Met    New Short Term Goals: Week 4: SLP Short Term Goal 1 (Week 4): Pt will verbally convey her needs and wants to caregivers at the sentence level in 75% of opportunities with min assist verbal cues. SLP Short Term Goal 2 (Week 4): Pt will name basic, familiar objects for 75% accuracy with min assist verbal cues for  word finding.  SLP Short Term Goal 3 (Week 4): Pt will locate items to the right of midline during basic, familiar tasks in 75% of opportunities with supervision verbal cues.  SLP Short Term Goal 4 (Week 4): Pt will consume dys 2 textures and thin liquids with mod I use of swallowing precautions over 2 consecutive sessions.  SLP Short Term Goal 5 (Week 4): Pt will selectively attend to basic, familiar tasks for 30 minute intervals with supervision verbal cues for redirection.   SLP Short Term Goal 6 (Week 4): Pt will consume therapeutic trials of dys 3 textures with supervision verbal cues for use of swallowing precautions.    Weekly Progress Updates:   Pt has made functional gains this reporting period and has met 4 out of 6 short term goals.  Pt is currently min-mod assist for tasks due to mild-moderate expressive aphasia.  Pt has demonstrated improved verbal expression for conveying needs and wants to caregivers but continues to have perseveration and paraphasic errors.  Pt is consuming dys 2, thin liquids diet with supervision-mod I use of swallowing precautions.  Pt and family education is ongoing.  Pt would continue to benefit from skilled ST while inpatient in order to maximize functional independence and reduce burden of care prior to discharge.  Anticipate that pt will need 24/7 at discharge in addition to intensive ST follow at next level of care.    Intensity: Minumum of  1-2 x/day, 30 to 90 minutes Frequency: 3 to 5 out of 7 days Duration/Length of Stay: 21 days  Treatment/Interventions: Cognitive remediation/compensation;Cueing hierarchy;Dysphagia/aspiration precaution training;Environmental controls;Internal/external aids;Multimodal communication approach;Speech/Language facilitation;Patient/family education;Functional tasks   Daily Session  Skilled Therapeutic Interventions: Pt was seen for skilled ST targeting dysphagia and communication.  SLP facilitated the session with skilled  observations completed during presentations of mixed solids and liquids consistencies.  Pt utilized swallowing precautions with mod I use of swallowing precautions and no overt s/s of aspiration.  SLP also facilitated the session with structured naming tasks.  Pt named items from the [facility] box with min-mod assist verbal cues to recognize and correct perseverative errors.  Pt also needed mod assist verbal cues during conversations with therapist to convey biographical information such as her children's and grandchildren's names.    Pt completed task with xxx assist for xxx.  Pt was returned to room and left in xxx with call bell within reach.  Continue per current plan of care.         Function:   Eating Eating   Modified Consistency Diet: Yes Eating Assist Level: Swallowing techniques: self managed           Cognition Comprehension Comprehension assist level: Understands basic 90% of the time/cues < 10% of the time  Expression   Expression assist level: Expresses basic 75 - 89% of the time/requires cueing 10 - 24% of the time. Needs helper to occlude trach/needs to repeat words.  Social Interaction Social Interaction assist level: Interacts appropriately 90% of the time - Needs monitoring or encouragement for participation or interaction.  Problem Solving Problem solving assist level: Solves basic 75 - 89% of the time/requires cueing 10 - 24% of the time  Memory Memory assist level: Recognizes or recalls 50 - 74% of the time/requires cueing 25 - 49% of the time   General    Pain Pain Assessment Pain Scale: 0-10 Pain Score: 0-No pain  Therapy/Group: Individual Therapy  Anacaren Kohan, Selinda Orion 05/03/2017, 12:45 PM

## 2017-05-03 NOTE — Plan of Care (Signed)
Pt remains incontinent of b/b Toilet program continues

## 2017-05-03 NOTE — Progress Notes (Signed)
[place] PHYSICAL MEDICINE & REHABILITATION     PROGRESS NOTE  Subjective/Complaints:  Patient seen lying in bed this morning. She states she slept well overnight. Discussed with therapies yesterday regarding improvement in mobility. Will follow-up on opportunities for casting.  ROS: denies CP, SOB, N/V/D  Objective: Vital Signs: Blood pressure 140/73, pulse 67, temperature 98.5 F (36.9 C), temperature source Oral, resp. rate 17, height 5\' 1"  (1.549 m), weight 76.5 kg (168 lb 10.4 oz), SpO2 99 %. No results found. Recent Labs    04/30/17 0924  WBC 8.4  HGB 15.6*  HCT 44.5  PLT 267   Recent Labs    04/30/17 0924  NA 134*  K 3.4*  CL 99*  GLUCOSE 220*  BUN 16  CREATININE 0.73  CALCIUM 9.1   CBG (last 3)  Recent Labs    05/02/17 1646 05/02/17 2128 05/03/17 0615  GLUCAP 95 126* 132*    Wt Readings from Last 3 Encounters:  04/18/17 76.5 kg (168 lb 10.4 oz)  04/06/17 74.5 kg (164 lb 3.9 oz)  02/28/17 79.4 kg (175 lb)    Physical Exam:  BP 140/73 (BP Location: Left Arm)   Pulse 67   Temp 98.5 F (36.9 C) (Oral)   Resp 17   Ht 5\' 1"  (1.549 m)   Wt 76.5 kg (168 lb 10.4 oz)   SpO2 99%   BMI 31.87 kg/m   Constitutional: No distress . Vital signs reviewed. HENT: Normocephalic, atraumatic. Eyes: EOMI, No discharge. Cardiovascular: RRR. No JVD    Respiratory: CTA Bilaterally. Normal effort    GI: BS +, non-distended  Musculoskeletal: No edema or tenderness in extremities  Neurological: alert and oriented 1 Dysphonia Motor: LUE 4-4+/5 proximal to distal LLE: 4/5 hip flexion, 4+/5 knee extension, 4+/5 ADF RUE/RLE: 0/5 proximal to distal (unchanged) Increase in tone RUE/RLE Skin: Skin is warm and dry.  Psychiatric: Flat  Assessment/Plan: 1. 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 Physiatrist is providing close  team supervision and 24 hour management of active medical problems listed below. Physiatrist and rehab team continue to assess barriers to discharge/monitor patient progress toward functional and medical goals.  Function:  Bathing Bathing position   Position: Shower  Bathing parts Body parts bathed by patient: Right arm, Chest, Abdomen, Right upper leg, Left upper leg, Right lower leg, Left lower leg, Front perineal area Body parts bathed by helper: Left arm, Buttocks, Back  Bathing assist Assist Level: Touching or steadying assistance(Pt > 75%)      Upper Body Dressing/Undressing Upper body dressing   What is the patient wearing?: Pull over shirt/dress     Pull over shirt/dress - Perfomed by patient: Thread/unthread left sleeve, Put head through opening, Pull shirt over trunk Pull over shirt/dress - Perfomed by helper: Thread/unthread right sleeve        Upper body assist Assist Level: Touching or steadying assistance(Pt > 75%)      Lower Body Dressing/Undressing Lower body dressing   What is the patient wearing?: Pants, Socks, Shoes     Pants- Performed by patient: Thread/unthread right pants leg, Thread/unthread left pants leg Pants- Performed by helper: Pull pants up/down(pt pulled pants up 75% of the way) Non-skid slipper socks- Performed by patient: Don/doff right sock, Don/doff left sock Non-skid slipper socks- Performed by helper: Don/doff right sock, Don/doff left sock Socks - Performed by patient: Don/doff right sock Socks -  Performed by helper: Don/doff left sock Shoes - Performed by patient: Don/doff right shoe Shoes - Performed by helper: Don/doff left shoe, Fasten right, Fasten left          Lower body assist Assist for lower body dressing: (Min assist)      Toileting Toileting Toileting activity did not occur: No continent bowel/bladder event Toileting steps completed by patient: Performs perineal hygiene Toileting steps completed by helper: Adjust  clothing prior to toileting, Adjust clothing after toileting Toileting Assistive Devices: Other (comment)(Standing in [place])  Toileting assist Assist level: (Standing in [place])   Transfers Chair/bed transfer   Chair/bed transfer method: Squat pivot Chair/bed transfer assist level: Moderate assist (Pt 50 - 74%/lift or lower) Chair/bed transfer assistive device: Armrests Mechanical lift: Ecologist Ambulation activity did not occur: Safety/medical concerns   Max distance: 10 Assist level: Maximal assist (Pt 25 - 49%)   Wheelchair Wheelchair activity did not occur: Safety/medical concerns Type: Manual Max wheelchair distance: 150 ft Assist Level: Touching or steadying assistance (Pt > 75%)  Cognition Comprehension Comprehension assist level: Understands basic 90% of the time/cues < 10% of the time  Expression Expression assist level: Expresses basic 75 - 89% of the time/requires cueing 10 - 24% of the time. Needs helper to occlude trach/needs to repeat words.  Social Interaction Social Interaction assist level: Interacts appropriately 90% of the time - Needs monitoring or encouragement for participation or interaction.  Problem Solving Problem solving assist level: Solves basic 75 - 89% of the time/requires cueing 10 - 24% of the time  Memory Memory assist level: Recognizes or recalls 50 - 74% of the time/requires cueing 25 - 49% of the time    Medical Problem List and Plan: 1.  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  DVT Prophylaxis/Anticoagulation: SCDs. Monitor for any signs of DVT 3. Pain Management: Tylenol as needed 4. Mood: Effexor 75 mg daily, Xanax 0.5 mg 3 times a day as needed   Amantadine 100 mg with breakfast and lunch started on 3/9, increased to 200 on 3/20.  5. Neuropsych: This patient is not capable of making  decisions on his own behalf. 6. Skin/Wound Care: Routine skin checks 7. Fluids/Electrolytes/Nutrition: Routine I&O's 8. Hypertension.    HCTZ 12.5 mg daily. Changed to [place] 25 mg on 3/8 given increase in BUN/CR   Avapro 300 mg daily.    Norvasc 5 mg started on 3/21, increased to 10 mg on 3/27   Labile, but improving on 3/28 9. Dysphagia. Diet advanced to D2/thins 10. Prediabetes:    CBGs before meals and at bedtime    Added sliding scale insulin for covg   Metformin 250 started on 3/20, increased to 500 on 3/25   Relatively controlled on 3/28 11. Transaminitis: Resolved   Continue to monitor 12. Leukocytosis: Resolved   WBCs 8.4 on 3/25   UA equivocal, Ucx no growth   CXR  Stable   Afebrile  13. Spasticity:   increasing tone RUE/RLE   continue ROM/splinting   Given sedation, stopped baclofen after 2 doses   Will follow-up on availability for serial casting 14. Hypokalemia   K+ 3.4 on 3/25   Daily supplementation increased on 3/26 15. Hyponatremia   Potassium 134 on 3/25   Continue to monitor  LOS (Days) 22 A FACE TO FACE  EVALUATION WAS PERFORMED  Dolph Tavano [name] 05/03/2017 8:29 AM

## 2017-05-03 NOTE — Patient Care Conference (Signed)
Inpatient RehabilitationTeam Conference and Plan of Care Update Date: 05/02/2017   Time: 2:30 PM    Patient Name: Ebony Williams [facility]      Medical Record Number: [id number]  Date of Birth: Nov 24, 1950 Sex: Female         Room/Bed: 4M05C/4M05C-01 Payor Info: Payor: [employer] park [employer] MEDICARE / Plan: [employer] MEDICARE / Product Type: *No Product type* /    Admitting Diagnosis: CVA  Admit Date/Time:  04/11/2017  4:33 PM Admission Comments: No comment available   Primary Diagnosis:  Left middle cerebral artery stroke [facility]) Principal Problem: Left middle cerebral artery stroke [facility])  Patient Active Problem List   Diagnosis Date Noted  . Spastic hemiplegia of right dominant side as late effect of cerebral infarction ([place])   . Hyponatremia   . Labile blood pressure   . Hypokalemia   . Elevated BUN   . Lethargy   . Transaminitis   . Essential hypertension   . Right hemiplegia ([place])   . Hypertensive crisis   . Left middle cerebral artery stroke ([place]) 04/11/2017  . Depression   . Dysphagia, post-stroke   . Diastolic dysfunction   . Benign essential HTN   . Prediabetes   . Leukocytosis   . Cerebral infarction due to occlusion of left middle cerebral artery ([place]) s/p IV tPA 04/06/2017  . Stroke (cerebrum) ([place]) 04/06/2017  . 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 Pain in joint, shoulder region 10/18/2011    Expected Discharge Date: Expected Discharge Date: (SNF)  Team Members Present: Physician leading conference: Dr. 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 on 04/06/17  Improve mobility, DM/HTN, hypokalemia/hyponatremia  See  above   Bowel/Bladder   incontinent of B&B, LBM3/26  continent with timed toileting with mod A  timed toileting q 2-3 hours while awake, monitor B&B q shift and PRN   Swallow/Nutrition/ Hydration   dys 2, thin liquids   supervision   toleration of diet upgrade and trials of advanced solids   ADL's   Min assist bathing and UB dressing, Mod assist LB dressing, Mod assist squat pivot transfers  Min assist overall  ADL retraining, activity tolerance, transfers, Rt attention, RUE NMR   Mobility   mod/max A transfers  downgraded to mod A for transfers and gait with PT only, supervision w/c  NMR, balance, transfers   Communication   min-mod assist   min assist   naming, word finding, expression of wants and needs to caregivers.    Safety/Cognition/ Behavioral Observations  mod assist   min assist   problem solving, memory, scanning to the right of midline   Pain   pt denies pain  pain <2  monitor q shift and PRN   Skin   ST LFA  breakdown free, infection free  assess q shift and PRN    Rehab Goals Patient on target to meet rehab goals: Yes Rehab Goals Revised: none *See Care Plan and progress notes for long and short-term goals.     Barriers to Discharge  Current Status/Progress Possible Resolutions Date Resolved   Physician    Medical stability;Decreased caregiver support;Lack of/limited family support     See  above  Therapies, follow labs, supplementing K+, optimize prediabtes/BP meds      Nursing                  PT                    OT                  SLP Decreased caregiver support              SW                Discharge Planning/Teaching Needs:  Pt and her family are wanting to pursue SNF for pt, as she wants to be more independent prior to returning home, especially with pt making recent progress.  Pt's dtr has been a part of therapy sessions, but further family education will be deferred to SNF.   Team Discussion:  No medical changes.  Dr. Posey Pronto plans to  continue to follow labs and will make minor tweaks to blood pressure and blood sugar medications, as needed.  Pt has not needed insulin coverage and is taking her meds easily in applesauce.  Pt is doing better with PT mobility and is able to participate more fully, being more awake and alert.  Her tone in her ankle is bad and PT is looking at ways to protect her ankle so that they can do more ambulating.  Orthotist to come assess pt.  OT is worried about ankle too and is pleased with pt's alertness and progress.  ST upgraded pt to thin liquids on Monday and she is tolerating it well and doing better with her communication at min A level.  Revisions to Treatment Plan:  none    Continued Need for Acute Rehabilitation Level of Care: The patient requires daily medical management by a physician with specialized training in physical medicine and rehabilitation for the following conditions: Daily direction of a multidisciplinary physical rehabilitation program to ensure safe treatment while eliciting the highest outcome that is of practical value to the patient.: Yes Daily medical management of patient stability for increased activity during participation in an intensive rehabilitation regime.: Yes Daily analysis of laboratory values and/or radiology reports with any subsequent need for medication adjustment of medical intervention for : Neurological problems;Blood pressure problems;Other;Diabetes problems  Tiwanda Threats, [name] 05/03/2017, 2:41 PM

## 2017-05-03 NOTE — Progress Notes (Signed)
Physical Therapy Session Note  Patient Details  Name: Ebony Williams MRN: [id number] Date of Birth: 25-Oct-1950  Today's Date: 05/03/2017 PT Individual Time: 1093-2355 PT Individual Time Calculation (min): 70 min   Short Term Goals: Week 3:  PT Short Term Goal 1 (Week 3): = LTG  Skilled Therapeutic Interventions/Progress Updates:   Pt received supine in bed and agreeable to PT. Supine>sit transfer with min assist and moderate cues from PT for safety.    Squat pivot transfer to [name] with mod assist, manual facilitation to improve pelvic rotation and LE placement. .   PT transported pt to rehab gym in [name].   With significant effort, PT applied anterior support WalkOn Reaction [place] AFO. Gait training instructed by PT. 22f, [id number] ankle instability noted throughout gait. Max assist provided by PT facilitate hip flexion to advance the RLE, prevent R knee hyper extension, as well as verbal cues for sequencing, posture, and attention to the RLE. Pt noted to initiate R knee extension with multimodal cues for stance phase as well as to prevent lateral LOB   WC mobility instructed by PT x 150ft 64f supervision assist. Min-mod cues for attention to obstacles on the L and doorway management on with L turn into rehab gym.   Pt returned to room and performed squat pivot transfer to bed with min assist. Sit>supine completed with min assist, and left supine in bed with call bell in reach and all needs met.       Therapy Documentation Precautions:  Precautions Precautions: Fall Precaution Comments: right hemiplegia and neglect, extensor tone in the left elbow and in the shoulder Restrictions Weight Bearing Restrictions: No    Pain: Pain Assessment Pain Scale: 0-10 Pain Score: 0-No pain   See Function Navigator for Current Functional Status.   Therapy/Group: Individual Therapy  AustinLorie Phenix2019, 2:24 PM

## 2017-05-03 NOTE — Progress Notes (Signed)
Occupational Therapy Session Note  Patient Details  Name: Ebony Williams MRN: [id number] Date of Birth: 15-Oct-1950  Today's Date: 05/03/2017 OT Individual Time: 9629-5284 OT Individual Time Calculation (min): 60 min    Short Term Goals: Week 3:  OT Short Term Goal 1 (Week 3): Pt will complete toilet transfer with mod assist of 1 caregiver OT Short Term Goal 2 (Week 3): Pt will complete LB dressing with max assist of one caregiver OT Short Term Goal 3 (Week 3): Pt will complete bathing with min assist OT Short Term Goal 4 (Week 3): Pt will complete UB dressing with min assist  Skilled Therapeutic Interventions/Progress Updates:    Treatment session with focus on RUE PROM/stretching, ADL retraining, and further visual assessment.  Pt received upright in bed reporting sleeping well overnight.  Therapist engaged in [place] to [place] at bed level prior to engaging in ADL retraining.  Pt reports pain at wrist with all movements, subsiding when at rest.  Pt with painful shoulder with shoulder flexion > 90* and external rotation.  Able to achieve 90* shoulder abduction without pain.  Decreased pain with prolonged stretch progressively achieving increased range, with pt able to tolerate shoulder flexion approx 140*.    Pt incontinent of bowel, therapist assisted with hygiene and donning clean brief at bed level.  Engaged in bathing/dressing seated EOB with pt able to wash lower legs and don pants, socks, and shoes in figure 4 position with assist to maintain position.  Pt demonstrating ability to recall hemi-dressing technique, still requiring assist to setup and thread Rt shirt sleeve.  Min-mod assist sit > stand to pull pants over hips, pt able to pull pants 75% over hips, requiring assist to adjust Rt side over hip.  Squat pivot transfer to Lt mod assist.    Engaged in further visual assessment with pt able to track items to Rt visual field with increased time and "jumpy" eye movements but no  reports of dizziness or double vision when scanning to Rt. Pt able to identify items in Rt and Lt visual fields, however during confrontation testing pt unable to identify stimulus in Rt visual field when also presented with stimulus in Lt visual field.  Engaged in matching activity with pt demonstrating increased difficulty scanning to Rt and locating matching cards in Rt visual field, however able to complete with increased time.  Therapy Documentation Precautions:  Precautions Precautions: Fall Precaution Comments: right hemiplegia and neglect, extensor tone in the left elbow and in the shoulder Restrictions Weight Bearing Restrictions: No Pain: Pt with c/o pain in Rt arm with PROM. Pain went away at rest.  See Function Navigator for Current Functional Status.   Therapy/Group: Individual Therapy  Simonne Come 05/03/2017, 9:50 AM

## 2017-05-03 NOTE — Progress Notes (Addendum)
Social Work Patient ID: Ebony Williams, female   DOB: 1950-04-23, 67 y.o.   MRN: [id number]   CSW met with pt and her husband 05-02-17 and then spoke with her dtr, Ebony Williams, to update them on conference discussion.  They feel pt will be best served at [facility] and that this will give her the best chance at independence prior to going home.  Dtr is touring SNFs and CSW will begin SNF search.  Team decided pt will be ready for move to SNF next week.  Pt and dtr are pleased with pt's progress this week. Dtr asked CSW if pt is a candidate for botox for tone and CSW asked Dr. Posey Pronto.  Dr. Posey Pronto said yes and that this would be addressed in the outpatient setting.  Relayed this message to pt's dtr.  CSW will continue to follow and assist as needed.

## 2017-05-04 ENCOUNTER — Inpatient Hospital Stay (HOSPITAL_COMMUNITY): Payer: Medicare Other | Admitting: Physical Therapy

## 2017-05-04 ENCOUNTER — Inpatient Hospital Stay (HOSPITAL_COMMUNITY): Payer: Medicare Other | Admitting: Occupational Therapy

## 2017-05-04 ENCOUNTER — Inpatient Hospital Stay (HOSPITAL_COMMUNITY): Payer: Medicare Other

## 2017-05-04 ENCOUNTER — Inpatient Hospital Stay (HOSPITAL_COMMUNITY): Payer: Medicare Other | Admitting: Speech Pathology

## 2017-05-04 LAB — GLUCOSE, CAPILLARY
Glucose-Capillary: 123 mg/dL — ABNORMAL HIGH (ref 65–99)
Glucose-Capillary: 119 mg/dL — ABNORMAL HIGH (ref 65–99)
Glucose-Capillary: 108 mg/dL — ABNORMAL HIGH (ref 65–99)
Glucose-Capillary: 144 mg/dL — ABNORMAL HIGH (ref 65–99)

## 2017-05-04 NOTE — Progress Notes (Signed)
Physical Therapy Weekly Progress Note  Patient Details  Name: Ebony Williams MRN: [id number] Date of Birth: 1950/02/15  Beginning of progress report period: April 27, 2017 End of progress report period: May 04, 2017  Today's Date: 05/04/2017 PT Individual Time: 1015-1100 PT Individual Time Calculation (min): 45 min   Pt with no c/o pain. Pt performs squat pivot transfers blocked practice x 4 with mod A, mod manual facilitation for wt shifts and cues for sequencing.  Sit to stand and standing tolerance with wt shifts with manual facilitation to put Rt ankle in neutral and prevent Rt knee hyperextension.  Gait with hemi walker with Rt UE around PTs shoulder with pt able to gait 20' x 2 with manual facilitation for wt shifts, Rt LE advancement and placement to protect ankle.  Pt pleased with progress.  nustep for continued Rt LE strength and coordination x 4 minutes level 3.   Patient has met 2 of 7 long term goals.  Pt able to begin gait training with hemi walker with mod A, progressing toward consistent min A for bed mobility and improved standing balance goals.  Patient continues to demonstrate the following deficits muscle weakness, impaired timing and sequencing, abnormal tone, unbalanced muscle activation, decreased coordination and decreased motor planning, decreased awareness, decreased problem solving, decreased safety awareness, decreased memory and delayed processing and decreased standing balance, decreased postural control, hemiplegia and decreased balance strategies and therefore will continue to benefit from skilled PT intervention to increase functional independence with mobility.  Patient progressing toward long term goals..  Continue plan of care.  PT Short Term Goals Week 3:  PT Short Term Goal 1 (Week 3): = LTG  Skilled Therapeutic Interventions/Progress Updates:  Ambulation/gait training;Balance/vestibular training;Cognitive remediation/compensation;Community  reintegration;Discharge planning;DME/adaptive equipment instruction;Functional electrical stimulation;Functional mobility training;Neuromuscular re-education;Patient/family education;Psychosocial support;Splinting/orthotics;Stair training;Therapeutic Activities;Therapeutic Exercise;UE/LE Strength taining/ROM;UE/LE Coordination activities;Visual/perceptual remediation/compensation;Wheelchair propulsion/positioning;Pain management     See Function Navigator for Current Functional Status.  Therapy/Group: Individual Therapy  [name],[name] 05/04/2017, 11:00 AM

## 2017-05-04 NOTE — Progress Notes (Signed)
Speech Language Pathology Daily Session Note  Patient Details  Name: Ebony Williams MRN: [id number] Date of Birth: September 29, 1950  Today's Date: 05/04/2017 SLP Individual Time: 1130-1200 SLP Individual Time Calculation (min): 30 min  Short Term Goals: Week 4: SLP Short Term Goal 1 (Week 4): Pt will verbally convey her needs and wants to caregivers at the sentence level in 75% of opportunities with min assist verbal cues. SLP Short Term Goal 2 (Week 4): Pt will name basic, familiar objects for 75% accuracy with min assist verbal cues for word finding.  SLP Short Term Goal 3 (Week 4): Pt will locate items to the right of midline during basic, familiar tasks in 75% of opportunities with supervision verbal cues.  SLP Short Term Goal 4 (Week 4): Pt will consume dys 2 textures and thin liquids with mod I use of swallowing precautions over 2 consecutive sessions.  SLP Short Term Goal 5 (Week 4): Pt will selectively attend to basic, familiar tasks for 30 minute intervals with supervision verbal cues for redirection.   SLP Short Term Goal 6 (Week 4): Pt will consume therapeutic trials of dys 3 textures with supervision verbal cues for use of swallowing precautions.    Skilled Therapeutic Interventions:  Pt was seen for skilled ST targeting communication goals.  Pt needed min assist verbal cues to convey that she needed to go to the bathroom upon therapist's arrival.  When transferring to bedside commode via Stedy lift, pt reported that she had "waited too long" and that she had been incontinent.  SLP provided hygiene and assisted in donning clean brief and pants.  Pt was returned to bed and left with bed alarm set and call bell within reach.  Continue per current plan of care.    Function:  Eating Eating                 Cognition Comprehension Comprehension assist level: Understands basic 90% of the time/cues < 10% of the time  Expression   Expression assist level: Expresses basic 75 - 89% of  the time/requires cueing 10 - 24% of the time. Needs helper to occlude trach/needs to repeat words.  Social Interaction Social Interaction assist level: Interacts appropriately 90% of the time - Needs monitoring or encouragement for participation or interaction.  Problem Solving Problem solving assist level: Solves basic 75 - 89% of the time/requires cueing 10 - 24% of the time  Memory Memory assist level: Recognizes or recalls 50 - 74% of the time/requires cueing 25 - 49% of the time    Pain Pain Assessment Pain Scale: 0-10 Pain Score: 0-No pain   Therapy/Group: Individual Therapy  Faizan Geraci, Selinda Orion 05/04/2017, 1:40 PM

## 2017-05-04 NOTE — Progress Notes (Signed)
Occupational Therapy Session Note  Patient Details  Name: Ebony Williams MRN: [id number] Date of Birth: 06/17/1950  Today's Date: 05/04/2017 OT Individual Time: 0900-1001 OT Individual Time Calculation (min): 61 min    Short Term Goals: Week 3:  OT Short Term Goal 1 (Week 3): Pt will complete toilet transfer with mod assist of 1 caregiver OT Short Term Goal 2 (Week 3): Pt will complete LB dressing with max assist of one caregiver OT Short Term Goal 3 (Week 3): Pt will complete bathing with min assist OT Short Term Goal 4 (Week 3): Pt will complete UB dressing with min assist  Skilled Therapeutic Interventions/Progress Updates:    Pt received supine in bed and agreeable to therapy. Pt tolerated passive prolonged stretch to R UE in order to reduce contracture risk and promote optimal muscle elasticity, with pain present, as detailed below. Pt transferred supine to EOB with steadying cues and vc provided for use of handrails. Pt completed squat pivot transfer to w/c with max A, with pt panicking halfway through transfer and terminating momentum. Education provided re safe transfer techniques and relaxation techniques. Charlaine Dalton was used to transfer pt into shower onto tub bench. Pt's brief and bed was extremely saturated, and pt's daughter alerted nursing. Pt educated re toileting schedule and advocating for herself here and upon d/c. Pt completed seated level UB/LB bathing in shower with min A provided for washing L UE. Pt experienced BM in shower and was transferred via stedy to a BSC. Pt required mod A to complete standing level peri-hygiene following toileting. Pt was educated re hemi-dressing technique and required min A to thread R UE through shirt. Pt educated re propping paretic R UE on table to facilitate applying deodorant. Pt left in room with daughter present, and quick release belt donned.   Therapy Documentation Precautions:  Precautions Precautions: Fall Precaution Comments: right  hemiplegia and neglect, extensor tone in the left elbow and in the shoulder Restrictions Weight Bearing Restrictions: No Pain: Pain Assessment Pain Scale: 0-10 Pain Score: 10-Worst pain ever Pain Type: Acute pain Pain Location: Arm Pain Orientation: Right Pain Descriptors / Indicators: Sharp Pain Onset: Other (Comment)(during PROM of R elbow, shoulder. Pain is sudden but subsides quickly with prolonged PROM) Pain Intervention(s): Relaxation;Rest;Repositioned  See Function Navigator for Current Functional Status.   Therapy/Group: Individual Therapy  Curtis Sites 05/04/2017, 11:31 AM

## 2017-05-04 NOTE — Progress Notes (Signed)
[facility]IC[facility] & REHABILITATION     PROGRESS NOTE  Subjective/Complaints:  Patient seen lying in bed this morning. She states she slept well overnight. She is more alert this morning.  ROS: Denies CP, SOB, N/V/D  Objective: Vital Signs: Blood pressure (!) 156/69, pulse 71, temperature (!) 97.5 F (36.4 C), temperature source Oral, resp. rate 18, height 5\' 1"  (1.549 m), weight 76.5 kg (168 lb 10.4 oz), SpO2 92 %. No results found. No results for input(s): WBC, HGB, HCT, PLT in the last 72 hours. No results for input(s): NA, K, CL, GLUCOSE, BUN, CREATININE, CALCIUM in the last 72 hours.  Invalid input(s): CO CBG (last 3)  Recent Labs    05/03/17 1630 05/03/17 2123 05/04/17 0638  GLUCAP 106* 160* 123*    Wt Readings from Last 3 Encounters:  04/18/17 76.5 kg (168 lb 10.4 oz)  04/06/17 74.5 kg (164 lb 3.9 oz)  02/28/17 79.4 kg (175 lb)    Physical Exam:  BP (!) 156/69 (BP Location: Left Arm)   Pulse 71   Temp (!) 97.5 F (36.4 C) (Oral)   Resp 18   Ht 5\' 1"  (1.549 m)   Wt 76.5 kg (168 lb 10.4 oz)   SpO2 92%   BMI 31.87 kg/m   Constitutional: No distress . Vital signs reviewed. HENT: Normocephalic, atraumatic. Eyes: EOMI, No discharge. Cardiovascular: RRR. No JVD    Respiratory: CTA Bilaterally. Normal effort    GI: BS +, non-distended  Musculoskeletal: No edema or tenderness in extremities  Neurological: alert and oriented 1 Dysphonia Motor: LUE 4-4+/5 proximal to distal LLE: 4/5 hip flexion, 4+/5 knee extension, 4+/5 ADF RUE/RLE: 0/5 proximal to distal (stable) Increase in tone RUE/RLE Skin: Skin is warm and dry.  Psychiatric: Flat  Assessment/Plan: 1. 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 Physiatrist is providing close team supervision and 24 hour management of active medical problems listed  below. Physiatrist and rehab team continue to assess barriers to discharge/monitor patient progress toward functional and medical goals.  Function:  Bathing Bathing position   Position: Sitting EOB  Bathing parts Body parts bathed by patient: Right arm, Chest, Abdomen, Right upper leg, Left upper leg, Right lower leg, Left lower leg, Front perineal area Body parts bathed by helper: Left arm, Buttocks, Back  Bathing assist Assist Level: Touching or steadying assistance(Pt > 75%)      Upper Body Dressing/Undressing Upper body dressing   What is the patient wearing?: Pull over shirt/dress     Pull over shirt/dress - Perfomed by patient: Thread/unthread left sleeve, Put head through opening, Pull shirt over trunk Pull over shirt/dress - Perfomed by helper: Thread/unthread right sleeve        Upper body assist Assist Level: Touching or steadying assistance(Pt > 75%)      Lower Body Dressing/Undressing Lower body dressing   What is the patient wearing?: Pants, Socks, Shoes     Pants- Performed by patient: Thread/unthread right pants leg, Thread/unthread left pants leg Pants- Performed by helper: Pull pants up/down Non-skid slipper socks- Performed by patient: Don/doff right sock, Don/doff left sock Non-skid slipper socks- Performed by helper: Don/doff right sock, Don/doff left sock Socks - Performed by patient: Don/doff right sock, Don/doff left sock Socks - Performed by helper: Don/doff left sock Shoes - Performed by patient: Don/doff right shoe, Don/doff left shoe Shoes - Performed by helper: Fasten right,  Fasten left          Lower body assist Assist for lower body dressing: (Mod assist)      Toileting Toileting Toileting activity did not occur: No continent bowel/bladder event Toileting steps completed by patient: Performs perineal hygiene Toileting steps completed by helper: Adjust clothing prior to toileting, Adjust clothing after toileting Toileting Assistive  Devices: Other (comment)(Standing in [place])  Toileting assist Assist level: (Standing in [place])   Transfers Chair/bed transfer   Chair/bed transfer method: Squat pivot Chair/bed transfer assist level: Touching or steadying assistance (Pt > 75%) Chair/bed transfer assistive device: Armrests Mechanical lift: Stedy   Locomotion Ambulation Ambulation activity did not occur: Safety/medical concerns   Max distance: 44ft  Assist level: Maximal assist (Pt 25 - 49%)   Wheelchair Wheelchair activity did not occur: Safety/medical concerns Type: Manual Max wheelchair distance: 118ft  Assist Level: Supervision or verbal cues  Cognition Comprehension Comprehension assist level: Understands basic 90% of the time/cues < 10% of the time  Expression Expression assist level: Expresses basic 75 - 89% of the time/requires cueing 10 - 24% of the time. Needs helper to occlude trach/needs to repeat words.  Social Interaction Social Interaction assist level: Interacts appropriately 90% of the time - Needs monitoring or encouragement for participation or interaction.  Problem Solving Problem solving assist level: Solves basic 75 - 89% of the time/requires cueing 10 - 24% of the time  Memory Memory assist level: Recognizes or recalls 50 - 74% of the time/requires cueing 25 - 49% of the time    Medical Problem List and Plan: 1.  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  DVT Prophylaxis/Anticoagulation: SCDs. Monitor for any signs of DVT 3. Pain Management: Tylenol as needed 4. Mood: Effexor 75 mg daily, Xanax 0.5 mg 3 times a day as needed   Amantadine 100 mg with breakfast and lunch started on 3/9, increased to 200 on 3/20.    Overall improving 5. Neuropsych: This patient is not capable of making decisions on his own behalf. 6. Skin/Wound Care: Routine  skin checks 7. Fluids/Electrolytes/Nutrition: Routine I&O's 8. Hypertension.    HCTZ 12.5 mg daily. Changed to [place] 25 mg on 3/8 given increase in BUN/CR   Avapro 300 mg daily.    Norvasc 5 mg started on 3/21, increased to 10 mg on 3/27   Labile on 3/29 9. Dysphagia. Diet advanced to D2/thins 10. Prediabetes:    CBGs before meals and at bedtime    Added sliding scale insulin for covg   Metformin 250 started on 3/20, increased to 500 on 3/25   Labile on 3/29 11. Transaminitis: Resolved   Continue to monitor 12. Leukocytosis: Resolved   WBCs 8.4 on 3/25   UA equivocal, Ucx no growth   CXR  Stable   Afebrile  13. Spasticity:   increasing tone RUE/RLE   continue ROM/splinting   Given sedation, stopped baclofen after 2 doses   Follow-up on potential for serial casting   Will consider botulinum toxin injection as outpatient 14. Hypokalemia   K+ 3.4 on 3/25   Daily supplementation increased on 3/26   Labs ordered for Monday 15. Hyponatremia   Potassium 134 on 3/25   Labs ordered for Monday   Continue to monitor  LOS (Days) 23 A FACE TO FACE EVALUATION WAS PERFORMED  Osha Rane [name] 05/04/2017 8:36 AM

## 2017-05-04 NOTE — Progress Notes (Signed)
Physical Therapy Session Note  Patient Details  Name: Ebony Williams MRN: [id number] Date of Birth: [birth year]  Today's Date: 05/04/2017 PT Individual Time:1635-1650   15 min   Short Term Goals: Week 1:  PT Short Term Goal 1 (Week 1): Pt will maintain sitting balance EOB with CGA PT Short Term Goal 1 - Progress (Week 1): Progressing toward goal PT Short Term Goal 2 (Week 1): Pt will perform squat pivot transfer Mod A consistently PT Short Term Goal 2 - Progress (Week 1): Progressing toward goal PT Short Term Goal 3 (Week 1): Pt will initiate w/c propulsion training PT Short Term Goal 3 - Progress (Week 1): Progressing toward goal Week 2:  PT Short Term Goal 1 (Week 2): pt will consistently perform transfers with mod A PT Short Term Goal 1 - Progress (Week 2): Progressing toward goal PT Short Term Goal 2 (Week 2): pt will propel w/c in controlled environment x 25' with min A PT Short Term Goal 2 - Progress (Week 2): Met Week 3:  PT Short Term Goal 1 (Week 3): = LTG  Skilled Therapeutic Interventions/Progress Updates:      Pt received sitting in WC. Pt requesting to return to bed.  Stand pivot transfer with LUE support on bed rail and min assist from PT. Sit>supine with min assist to control RLE with extensive tone. Scooting in bed to the R with min assist from PT. Throughout transfers and bed mobility training, PT provided min-mod cues for improved use of the RLE and well as facilitation of proper RLE positioning and knee flexion to limit effects of tone . Pt left supine in bed with call bell in reach and all needs met.        Therapy Documentation Precautions:  Precautions Precautions: Fall Precaution Comments: right hemiplegia and neglect, extensor tone in the left elbow and in the shoulder Restrictions Weight Bearing Restrictions: No Vital Signs: Therapy Vitals Temp: 97.9 F (36.6 C) Temp Source: Oral Pulse Rate: 82 Resp: 18 BP: 128/62 Patient Position (if  appropriate): Lying Oxygen Therapy SpO2: 98 % O2 Device: Room Air Pain: Pain Assessment Pain Scale: 0-10 Pain Score: 0-No pain   See Function Navigator for Current Functional Status.   Therapy/Group: Individual Therapy  [name] 05/04/2017, 5:05 PM

## 2017-05-04 NOTE — Progress Notes (Signed)
Occupational Therapy Session Note  Patient Details  Name: Ebony Williams MRN: [id number] Date of Birth: 11-25-1950  Today's Date: 05/04/2017 OT Individual Time: 0388-8280 OT Individual Time Calculation (min): 40 min    Short Term Goals: Week 3:  OT Short Term Goal 1 (Week 3): Pt will complete toilet transfer with mod assist of 1 caregiver OT Short Term Goal 2 (Week 3): Pt will complete LB dressing with max assist of one caregiver OT Short Term Goal 3 (Week 3): Pt will complete bathing with min assist OT Short Term Goal 4 (Week 3): Pt will complete UB dressing with min assist  Skilled Therapeutic Interventions/Progress Updates:    Treatment session with focus on RUE NMR and vision.  Pt received supine in bed willing to engage in therapy session.  Completed bed mobility with min assist to advance RLE.  Squat pivot transfer to Rt with max assist due to decreased weight shift and attention to Rt.  Engaged in [place] with focus on elbow extension/flexion to decrease tone and improve positioning when seated upright in w/c with half lap tray.  Engaged in visual scanning task with matching cards in [place], provided 12 cards with pt demonstrating increased difficulty due to increase in number of cards.  Engaged in visual scanning activity requiring pt to scan letters to identify each letter aloud, pt demonstrating ability to scan Lt to Rt but with difficulty naming letters 50% of time.  Increased challenge to random scanning requiring pt to locate shapes in mixed background of shapes and letters.  Noted pt tendency to overshoot with scanning beyond shape and then able to identify it.  Much improved with orderly scanning when compared to disorganized scanning.  Pt left upright in w/c with quick release belt intact and nurse tech present to assess vitals.  Therapy Documentation Precautions:  Precautions Precautions: Fall Precaution Comments: right hemiplegia and neglect, extensor  tone in the left elbow and in the shoulder Restrictions Weight Bearing Restrictions: No General:   Vital Signs: Therapy Vitals Temp: 97.9 F (36.6 C) Temp Source: Oral Pulse Rate: 82 Resp: 18 BP: 128/62 Patient Position (if appropriate): Lying Oxygen Therapy SpO2: 98 % O2 Device: Room Air Pain: Pain Assessment Pain Scale: 0-10 Pain Score: 0-No pain  See Function Navigator for Current Functional Status.   Therapy/Group: Individual Therapy  Simonne Come 05/04/2017, 3:57 PM

## 2017-05-05 ENCOUNTER — Inpatient Hospital Stay (HOSPITAL_COMMUNITY): Payer: Medicare Other | Admitting: Occupational Therapy

## 2017-05-05 ENCOUNTER — Inpatient Hospital Stay (HOSPITAL_COMMUNITY): Payer: Medicare Other | Admitting: Physical Therapy

## 2017-05-05 LAB — GLUCOSE, CAPILLARY
GLUCOSE-CAPILLARY: 127 mg/dL — AB (ref 65–99)
Glucose-Capillary: 126 mg/dL — ABNORMAL HIGH (ref 65–99)
Glucose-Capillary: 169 mg/dL — ABNORMAL HIGH (ref 65–99)
Glucose-Capillary: 111 mg/dL — ABNORMAL HIGH (ref 65–99)

## 2017-05-05 NOTE — Progress Notes (Signed)
Occupational Therapy Session Note  Patient Details  Name: Ebony Williams MRN: [id number] Date of Birth: 05-23-1950  Today's Date: 05/05/2017 OT Group Time: 1300-1400 OT Group Time Calculation (min): 60 min  Short Term Goals: Week 3:  OT Short Term Goal 1 (Week 3): Pt will complete toilet transfer with mod assist of 1 caregiver OT Short Term Goal 2 (Week 3): Pt will complete LB dressing with max assist of one caregiver OT Short Term Goal 3 (Week 3): Pt will complete bathing with min assist OT Short Term Goal 4 (Week 3): Pt will complete UB dressing with min assist  Skilled Therapeutic Interventions/Progress Updates:    Pt participating in w/c level therapeutic dancing group. Tx focus on Rt NMR/Rt attention, trunk control, and social participation. Pt attending to therapist providing instruction on her Rt side. She engaged core musculature while sitting unsupported and rotating trunk with supervision-Min A for balance. Incorporated bilateral and reciprocal shoulder movements in gravity eliminated positions. Also instructed her on arm cradling and active assisted movements for NMR, with HOH from OT when appropriate. Gentle stretching completed to R UE during rest periods due to hypertonicity. Throughout session, pt intermittently recalling familiar songs and singing along, smiling. At end of session pt was escorted back to room. She was repositioned for comfort with half lap tray and all needs within reach.     Therapy Documentation Precautions:  Precautions Precautions: Fall Precaution Comments: right hemiplegia and neglect, extensor tone in the left elbow and in the shoulder Restrictions Weight Bearing Restrictions: No :    See Function Navigator for Current Functional Status.   Therapy/Group: Group Therapy  Priya Matsen A Shaquoya Cosper 05/05/2017, 5:11 PM

## 2017-05-05 NOTE — Progress Notes (Signed)
Physical Therapy Session Note  Patient Details  Name: Ebony Williams MRN: [id number] Date of Birth: 11/08/1950  Today's Date: 05/05/2017 PT Individual Time: 0905-0935 PT Individual Time Calculation (min): 30 min   Short Term Goals: Week 3:  PT Short Term Goal 1 (Week 3): = LTG  Skilled Therapeutic Interventions/Progress Updates:   Pt received supine in bed and agreeable to PT. Supine>sit transfer with min assist and min facilitation of RLE to minimize tone. Sit<>stand from EOB with HW and min assist. Pt noted to have had on incontinent bowel movement. Stand pivot transfer to [facility] with mod assist. Sit<>stand with HW x 3 with min-mod assist for standing balance while PT performed Perineal hygiene. Stand pivot transfer to [facility] with Mod assist and moderate cues for sequencing and set up. Sit<>stand from [facility] with mod assist for PT to perform lower body dress.  Pt left sitting in WC with call bell in reach and all needs met.         Therapy Documentation Precautions:  Precautions Precautions: Fall Precaution Comments: right hemiplegia and neglect, extensor tone in the left elbow and in the shoulder Restrictions Weight Bearing Restrictions: No Vital Signs: Therapy Vitals BP: 110/71 Pain: Pain Assessment Pain Score: 0-No pain   See Function Navigator for Current Functional Status.   Therapy/Group: Individual Therapy  [name] 05/05/2017, 12:04 PM

## 2017-05-05 NOTE — Progress Notes (Signed)
[place] PHYSICAL MEDICINE & REHABILITATION     PROGRESS NOTE  Subjective/Complaints:   No issues overnite  ROS: Denies CP, SOB, N/V/D  Objective: Vital Signs: Blood pressure (!) 141/64, pulse 67, temperature 98 F (36.7 C), temperature source Oral, resp. rate 16, height 5\' 1"  (1.549 m), weight 76.5 kg (168 lb 10.4 oz), SpO2 96 %. No results found. No results for input(s): WBC, HGB, HCT, PLT in the last 72 hours. No results for input(s): NA, K, CL, GLUCOSE, BUN, CREATININE, CALCIUM in the last 72 hours.  Invalid input(s): CO CBG (last 3)  Recent Labs    05/04/17 1643 05/04/17 2103 05/05/17 0623  GLUCAP 108* 144* 126*    Wt Readings from Last 3 Encounters:  04/18/17 76.5 kg (168 lb 10.4 oz)  04/06/17 74.5 kg (164 lb 3.9 oz)  02/28/17 79.4 kg (175 lb)    Physical Exam:  BP (!) 141/64 (BP Location: Left Arm)   Pulse 67   Temp 98 F (36.7 C) (Oral)   Resp 16   Ht 5\' 1"  (1.549 m)   Wt 76.5 kg (168 lb 10.4 oz)   SpO2 96%   BMI 31.87 kg/m   Constitutional: No distress . Vital signs reviewed. HENT: Normocephalic, atraumatic. Eyes: EOMI, No discharge. Cardiovascular: RRR. No JVD    Respiratory: CTA Bilaterally. Normal effort    GI: BS +, non-distended  Musculoskeletal: No edema or tenderness in extremities  Neurological: alert and oriented 1 Dysphonia Motor: LUE 4-4+/5 proximal to distal LLE: 4/5 hip flexion, 4+/5 knee extension, 4+/5 ADF RUE/RLE: 0/5 proximal to distal (stable) Increase in tone RUE/RLE Skin: Skin is warm and dry.  Psychiatric: Flat  Assessment/Plan: 1. 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 Physiatrist is providing close team supervision and 24 hour management of active medical problems listed below. Physiatrist and rehab team continue to assess barriers to discharge/monitor patient progress toward  functional and medical goals.  Function:  Bathing Bathing position   Position: Shower  Bathing parts Body parts bathed by patient: Right arm, Chest, Abdomen, Right upper leg, Left upper leg, Right lower leg, Left lower leg, Front perineal area Body parts bathed by helper: Left arm, Buttocks, Back  Bathing assist Assist Level: Touching or steadying assistance(Pt > 75%)      Upper Body Dressing/Undressing Upper body dressing   What is the patient wearing?: Pull over shirt/dress     Pull over shirt/dress - Perfomed by patient: Thread/unthread left sleeve, Put head through opening, Pull shirt over trunk Pull over shirt/dress - Perfomed by helper: Thread/unthread right sleeve        Upper body assist Assist Level: Touching or steadying assistance(Pt > 75%)      Lower Body Dressing/Undressing Lower body dressing   What is the patient wearing?: Pants, Socks     Pants- Performed by patient: Pull pants up/down Pants- Performed by helper: Thread/unthread right pants leg, Thread/unthread left pants leg Non-skid slipper socks- Performed by patient: Don/doff right sock, Don/doff left sock Non-skid slipper socks- Performed by helper: Don/doff right sock, Don/doff left sock Socks - Performed by patient: Don/doff right sock, Don/doff left sock Socks - Performed by helper: Don/doff left sock Shoes - Performed by patient: Don/doff right shoe, Don/doff left shoe Shoes - Performed by helper: Fasten right, Fasten left          Lower body assist Assist for lower body dressing:  Touching or steadying assistance (Pt > 75%)      Toileting Toileting Toileting activity did not occur: No continent bowel/bladder event Toileting steps completed by patient: Adjust clothing prior to toileting Toileting steps completed by helper: Performs perineal hygiene, Adjust clothing after toileting Toileting Assistive Devices: Other (comment)(stedy)  Toileting assist Assist level: Touching or steadying  assistance (Pt.75%)   Transfers Chair/bed transfer   Chair/bed transfer method: Squat pivot Chair/bed transfer assist level: Maximal assist (Pt 25 - 49%/lift and lower) Chair/bed transfer assistive device: Armrests Mechanical lift: Stedy   Locomotion Ambulation Ambulation activity did not occur: Safety/medical concerns   Max distance: 40ft  Assist level: Maximal assist (Pt 25 - 49%)   Wheelchair Wheelchair activity did not occur: Safety/medical concerns Type: Manual Max wheelchair distance: 123ft  Assist Level: Supervision or verbal cues  Cognition Comprehension Comprehension assist level: Understands basic 90% of the time/cues < 10% of the time  Expression Expression assist level: Expresses basic 75 - 89% of the time/requires cueing 10 - 24% of the time. Needs helper to occlude trach/needs to repeat words.  Social Interaction Social Interaction assist level: Interacts appropriately 90% of the time - Needs monitoring or encouragement for participation or interaction.  Problem Solving Problem solving assist level: Solves basic 75 - 89% of the time/requires cueing 10 - 24% of the time  Memory Memory assist level: Recognizes or recalls 50 - 74% of the time/requires cueing 25 - 49% of the time    Medical Problem List and Plan: 1.  Right side weakness with aphasia secondary to multifocal ischemic left MCA and PCA infarcts on 04/06/17   Continue CIR, plan for SNF   [facility]    CT head 3/11  Stable without acute changes 2.  DVT Prophylaxis/Anticoagulation: SCDs. Monitor for any signs of DVT 3. Pain Management: Tylenol as needed 4. Mood: Effexor 75 mg daily, Xanax 0.5 mg 3 times a day as needed   Amantadine 100 mg with breakfast and lunch started on 3/9, increased to 200 on 3/20.    Overall improving 5. Neuropsych: This patient is not capable of making decisions on his own behalf. 6. Skin/Wound Care: Routine skin checks 7. Fluids/Electrolytes/Nutrition: Routine I&O's 8. Hypertension.     HCTZ 12.5 mg daily. Changed to [place] 25 mg on 3/8 given increase in BUN/CR   Avapro 300 mg daily.    Norvasc 5 mg started on 3/21, increased to 10 mg on 3/27   Labile on 3/29 9. Dysphagia. Diet advanced to D2/thins 10. Prediabetes:    CBGs before meals and at bedtime    Added sliding scale insulin for covg   Metformin 250 started on 3/20, increased to 500 on 3/25    CBG (last 3)  Recent Labs    05/04/17 1643 05/04/17 2103 05/05/17 0623  GLUCAP 108* 144* 126*  controlled 3/30 11. Transaminitis: Resolved   Continue to monitor 12. Leukocytosis: Resolved   WBCs 8.4 on 3/25   UA equivocal, Ucx no growth   CXR  Stable   Afebrile  13. Spasticity:   increasing tone RUE/RLE   continue ROM/splinting   Given sedation, stopped baclofen after 2 doses   Follow-up on potential for serial casting   Will consider botulinum toxin injection as outpatient 14. Hypokalemia   K+ 3.4 on 3/25   Daily supplementation increased on 3/26   Labs ordered for Monday 15. Hyponatremia   Potassium 134 on 3/25   Labs ordered for Monday   Continue to monitor  LOS (Days)  [place] EVALUATION WAS PERFORMED  Charlett Blake 05/05/2017 7:47 AM

## 2017-05-06 ENCOUNTER — Inpatient Hospital Stay (HOSPITAL_COMMUNITY): Payer: Medicare Other | Admitting: Occupational Therapy

## 2017-05-06 LAB — GLUCOSE, CAPILLARY
GLUCOSE-CAPILLARY: 120 mg/dL — AB (ref 65–99)
Glucose-Capillary: 128 mg/dL — ABNORMAL HIGH (ref 65–99)
Glucose-Capillary: 133 mg/dL — ABNORMAL HIGH (ref 65–99)
Glucose-Capillary: 131 mg/dL — ABNORMAL HIGH (ref 65–99)

## 2017-05-06 NOTE — Progress Notes (Signed)
Occupational Therapy Session Note  Patient Details  Name: Ebony Williams MRN: [id number] Date of Birth: 01/13/1951  Today's Date: 05/06/2017 OT Individual Time: 9983-3825 OT Individual Time Calculation (min): 59 min   Short Term Goals: Week 3:  OT Short Term Goal 1 (Week 3): Pt will complete toilet transfer with mod assist of 1 caregiver OT Short Term Goal 2 (Week 3): Pt will complete LB dressing with max assist of one caregiver OT Short Term Goal 3 (Week 3): Pt will complete bathing with min assist OT Short Term Goal 4 (Week 3): Pt will complete UB dressing with min assist  Skilled Therapeutic Interventions/Progress Updates:    Pt greeted supine in bed, reporting her R UE was hurting but feeling ok overall. Tx focus on functional transfers, balance, and Rt NMR/scanning during toileting, dressing, and grooming tasks. As preparatory activity, provided gentle prolonged stretching to Rt elbow, wrist, and digits. Incorporated use of MHP for reducing tone (skin intact pre and post tx). Hypertonicity significantly improved afterwards.Tried to place R UE onto RW handle but pt unable to tolerate for more than 5 seconds due to pain, and then hypertonicity increased again. Stand pivot<BSC completed with Max A and Lt side supported when moving towards Rt. Pt incontinent during transfer, but able to continue with BM once seated on BSC.  Mod A for sitting/standing balance during perihygiene and dressing tasks using hemi walker as needed. Once she transferred to w/c, worked on [employer] when locating grooming/oral care items. Facilitated HOH for functional use of R UE. At end of tx pt was repositioned for comfort and left with half lap tray, safety belt, and all needs within reach. Positioned her R UE with props to to facilitate ER in pain free range. Left her with visitor present.   Though attempted, unable to fit pts Rt foot and LE brace component inside of shoe.   Therapy Documentation Precautions:   Precautions Precautions: Fall Precaution Comments: right hemiplegia and neglect, extensor tone in the left elbow and in the shoulder Restrictions Weight Bearing Restrictions: No Pain: In R UE due to tone. She reported this decreased after stretches/MHP.  Pain Assessment Pain Score: 0-No pain ADL:      See Function Navigator for Current Functional Status.   Therapy/Group: Individual Therapy  Caleel Kiner A Sharone Picchi 05/06/2017, 12:52 PM

## 2017-05-06 NOTE — Progress Notes (Signed)
[place] PHYSICAL MEDICINE & REHABILITATION     PROGRESS NOTE  Subjective/Complaints:   No issues overnite, discussed vital signs  ROS: Denies CP, SOB, N/V/D  Objective: Vital Signs: Blood pressure (!) 148/63, pulse 70, temperature 97.9 F (36.6 C), temperature source Oral, resp. rate 16, height 5\' 1"  (1.549 m), weight 76.5 kg (168 lb 10.4 oz), SpO2 95 %. No results found. No results for input(s): WBC, HGB, HCT, PLT in the last 72 hours. No results for input(s): NA, K, CL, GLUCOSE, BUN, CREATININE, CALCIUM in the last 72 hours.  Invalid input(s): CO CBG (last 3)  Recent Labs    05/05/17 1643 05/05/17 2138 05/06/17 0636  GLUCAP 111* 127* 128*    Wt Readings from Last 3 Encounters:  04/18/17 76.5 kg (168 lb 10.4 oz)  04/06/17 74.5 kg (164 lb 3.9 oz)  02/28/17 79.4 kg (175 lb)    Physical Exam:  BP (!) 148/63 (BP Location: Left Arm)   Pulse 70   Temp 97.9 F (36.6 C) (Oral)   Resp 16   Ht 5\' 1"  (1.549 m)   Wt 76.5 kg (168 lb 10.4 oz)   SpO2 95%   BMI 31.87 kg/m   Constitutional: No distress . Vital signs reviewed. HENT: Normocephalic, atraumatic. Eyes: EOMI, No discharge. Cardiovascular: RRR. No JVD    Respiratory: CTA Bilaterally. Normal effort    GI: BS +, non-distended  Musculoskeletal: No edema or tenderness in extremities  Neurological: alert and oriented 1 Dysphonia Motor: LUE 4-4+/5 proximal to distal LLE: 4/5 hip flexion, 4+/5 knee extension, 4+/5 ADF RUE/RLE: 0/5 proximal to distal (stable) Increase in tone RUE/RLE Skin: Skin is warm and dry.  Psychiatric: Flat  Assessment/Plan: 1. 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 Physiatrist is providing close team supervision and 24 hour management of active medical problems listed below. Physiatrist and rehab team continue to assess barriers to  discharge/monitor patient progress toward functional and medical goals.  Function:  Bathing Bathing position   Position: Shower  Bathing parts Body parts bathed by patient: Right arm, Chest, Abdomen, Right upper leg, Left upper leg, Right lower leg, Left lower leg, Front perineal area Body parts bathed by helper: Left arm, Buttocks, Back  Bathing assist Assist Level: Touching or steadying assistance(Pt > 75%)      Upper Body Dressing/Undressing Upper body dressing   What is the patient wearing?: Pull over shirt/dress     Pull over shirt/dress - Perfomed by patient: Thread/unthread left sleeve, Put head through opening, Pull shirt over trunk Pull over shirt/dress - Perfomed by helper: Thread/unthread right sleeve        Upper body assist Assist Level: Touching or steadying assistance(Pt > 75%)      Lower Body Dressing/Undressing Lower body dressing   What is the patient wearing?: Pants, Socks     Pants- Performed by patient: Pull pants up/down Pants- Performed by helper: Thread/unthread right pants leg, Thread/unthread left pants leg Non-skid slipper socks- Performed by patient: Don/doff right sock, Don/doff left sock Non-skid slipper socks- Performed by helper: Don/doff right sock, Don/doff left sock Socks - Performed by patient: Don/doff right sock, Don/doff left sock Socks - Performed by helper: Don/doff left sock Shoes - Performed by patient: Don/doff right shoe, Don/doff left shoe Shoes - Performed by helper: Fasten right, Fasten left          Lower body assist Assist for  lower body dressing: Touching or steadying assistance (Pt > 75%)      Toileting Toileting Toileting activity did not occur: No continent bowel/bladder event Toileting steps completed by patient: Adjust clothing prior to toileting Toileting steps completed by helper: Performs perineal hygiene, Adjust clothing after toileting Toileting Assistive Devices: Other (comment)(stedy)  Toileting assist  Assist level: Touching or steadying assistance (Pt.75%)   Transfers Chair/bed transfer   Chair/bed transfer method: Stand pivot Chair/bed transfer assist level: Moderate assist (Pt 50 - 74%/lift or lower) Chair/bed transfer assistive device: Armrests Mechanical lift: Ecologist Ambulation activity did not occur: Safety/medical concerns   Max distance: 61ft  Assist level: Maximal assist (Pt 25 - 49%)   Wheelchair Wheelchair activity did not occur: Safety/medical concerns Type: Manual Max wheelchair distance: 137ft  Assist Level: Supervision or verbal cues  Cognition Comprehension Comprehension assist level: Understands basic 90% of the time/cues < 10% of the time  Expression Expression assist level: Expresses basic 75 - 89% of the time/requires cueing 10 - 24% of the time. Needs helper to occlude trach/needs to repeat words.  Social Interaction Social Interaction assist level: Interacts appropriately 90% of the time - Needs monitoring or encouragement for participation or interaction.  Problem Solving Problem solving assist level: Solves basic 75 - 89% of the time/requires cueing 10 - 24% of the time  Memory Memory assist level: Recognizes or recalls 50 - 74% of the time/requires cueing 25 - 49% of the time    Medical Problem List and Plan: 1.  Right side weakness with aphasia secondary to multifocal ischemic left MCA and PCA infarcts on 04/06/17   Continue CIR, plan for SNF   [facility]    CT head 3/11  Stable without acute changes 2.  DVT Prophylaxis/Anticoagulation: SCDs. Monitor for any signs of DVT 3. Pain Management: Tylenol as needed 4. Mood: Effexor 75 mg daily, Xanax 0.5 mg 3 times a day as needed   Amantadine 100 mg with breakfast and lunch started on 3/9, increased to 200 on 3/20.    Overall improving 5. Neuropsych: This patient is not capable of making decisions on his own behalf. 6. Skin/Wound Care: Routine skin checks 7.  Fluids/Electrolytes/Nutrition: Routine I&O's 8. Hypertension.    HCTZ 12.5 mg daily. Changed to [place] 25 mg on 3/8 given increase in BUN/CR   Avapro 300 mg daily.    Norvasc 5 mg started on 3/21, increased to 10 mg on 3/27   Labile on 3/31 Vitals:   05/05/17 1550 05/06/17 0335  BP: 126/63 (!) 148/63  Pulse: 91 70  Resp: 16   Temp: 97.6 F (36.4 C) 97.9 F (36.6 C)  SpO2: 98% 95%   9. Dysphagia. Diet advanced to D2/thins 10. Prediabetes: now DM2 controlled   CBGs before meals and at bedtime    Added sliding scale insulin for covg   Metformin 250 started on 3/20, increased to 500 on 3/25    CBG (last 3)  Recent Labs    05/05/17 1643 05/05/17 2138 05/06/17 0636  GLUCAP 111* 127* 128*  controlled 3/31 11. Transaminitis: Resolved   Continue to monitor 12. Leukocytosis: Resolved   WBCs 8.4 on 3/25   UA equivocal, Ucx no growth   CXR  Stable   Afebrile  13. Spasticity:   increasing tone RUE/RLE   continue ROM/splinting   Given sedation, stopped baclofen after 2 doses   Follow-up on potential for serial casting   Will consider botulinum toxin injection as outpatient 14. Hypokalemia  K+ 3.4 on 3/25   Daily supplementation increased on 3/26   Labs ordered for Monday 15. Hyponatremia   Potassium 134 on 3/25   Labs ordered for Monday   Continue to monitor  LOS (Days) 25 A FACE TO FACE EVALUATION WAS PERFORMED  Charlett Blake 05/06/2017 7:51 AM

## 2017-05-07 ENCOUNTER — Inpatient Hospital Stay (HOSPITAL_COMMUNITY): Payer: Medicare Other | Admitting: Speech Pathology

## 2017-05-07 ENCOUNTER — Inpatient Hospital Stay (HOSPITAL_COMMUNITY): Payer: Medicare Other | Admitting: Physical Therapy

## 2017-05-07 ENCOUNTER — Inpatient Hospital Stay (HOSPITAL_COMMUNITY): Payer: Medicare Other | Admitting: Occupational Therapy

## 2017-05-07 LAB — GLUCOSE, CAPILLARY
GLUCOSE-CAPILLARY: 169 mg/dL — AB (ref 65–99)
GLUCOSE-CAPILLARY: 88 mg/dL (ref 65–99)
GLUCOSE-CAPILLARY: 133 mg/dL — AB (ref 65–99)
Glucose-Capillary: 129 mg/dL — ABNORMAL HIGH (ref 65–99)

## 2017-05-07 LAB — BASIC METABOLIC PANEL
ANION GAP: 12 (ref 5–15)
BUN: 16 mg/dL (ref 6–20)
CALCIUM: 9.2 mg/dL (ref 8.9–10.3)
CO2: 23 mmol/L (ref 22–32)
Chloride: 100 mmol/L — ABNORMAL LOW (ref 101–111)
Creatinine, Ser: 0.75 mg/dL (ref 0.44–1.00)
GFR calc Af Amer: >60 mL/min (ref >60)
GLUCOSE: 221 mg/dL — AB (ref 65–99)
Potassium: 3.5 mmol/L (ref 3.5–5.1)
Sodium: 135 mmol/L (ref 135–145)

## 2017-05-07 LAB — CBC WITH DIFFERENTIAL/PLATELET
BASOS ABS: 0 10*3/uL (ref 0.0–0.1)
BASOS PCT: 0 %
Eosinophils Absolute: 0.1 10*3/uL (ref 0.0–0.7)
Eosinophils Relative: 1 %
HEMATOCRIT: 47.7 % — AB (ref 36.0–46.0)
HEMOGLOBIN: 15.7 g/dL — AB (ref 12.0–15.0)
Lymphocytes Relative: 22 %
Lymphs Abs: 1.9 10*3/uL (ref 0.7–4.0)
MCH: 30.6 pg (ref 26.0–34.0)
MCHC: 32.9 g/dL (ref 30.0–36.0)
MCV: 93 fL (ref 78.0–100.0)
MONO ABS: 0.4 10*3/uL (ref 0.1–1.0)
Monocytes Relative: 4 %
NEUTROS ABS: 6.3 10*3/uL (ref 1.7–7.7)
NEUTROS PCT: 73 %
Platelets: 263 10*3/uL (ref 150–400)
RBC: 5.13 MIL/uL — AB (ref 3.87–5.11)
RDW: 13.9 % (ref 11.5–15.5)
WBC: 8.7 10*3/uL (ref 4.0–10.5)

## 2017-05-07 MED ORDER — PANTOPRAZOLE SODIUM 40 MG PO TBEC: 40.0000 mg | DELAYED_RELEASE_TABLET | Freq: Every day | ORAL | Status: AC

## 2017-05-07 MED ORDER — AMLODIPINE BESYLATE 10 MG PO TABS: 10.0000 mg | ORAL_TABLET | Freq: Every day | ORAL | Status: DC

## 2017-05-07 MED ORDER — CHLORTHALIDONE 25 MG PO TABS: 25.0000 mg | ORAL_TABLET | Freq: Every day | ORAL | Status: DC

## 2017-05-07 MED ORDER — CLOPIDOGREL BISULFATE 75 MG PO TABS: 75.0000 mg | ORAL_TABLET | Freq: Every day | ORAL | Status: AC

## 2017-05-07 MED ORDER — ASPIRIN 81 MG PO CHEW: 81.0000 mg | CHEWABLE_TABLET | Freq: Every day | ORAL | Status: AC

## 2017-05-07 MED ORDER — METFORMIN HCL 500 MG PO TABS: 500.0000 mg | ORAL_TABLET | Freq: Every day | ORAL | Status: AC

## 2017-05-07 MED ORDER — ALPRAZOLAM 0.5 MG PO TABS: 0.5000 mg | ORAL_TABLET | Freq: Three times a day (TID) | ORAL | 0 refills | Status: DC | PRN

## 2017-05-07 MED ORDER — AMANTADINE HCL 100 MG PO CAPS: 200.0000 mg | ORAL_CAPSULE | Freq: Two times a day (BID) | ORAL | 0 refills | Status: AC

## 2017-05-07 MED ORDER — IRBESARTAN 300 MG PO TABS: 300.0000 mg | ORAL_TABLET | Freq: Every day | ORAL | Status: DC

## 2017-05-07 NOTE — Progress Notes (Signed)
Occupational Therapy Session Note  Patient Details  Name: Ebony Williams MRN: [id number] Date of Birth: 1951/02/05  Today's Date: 05/07/2017 OT Individual Time: 1100-1200 OT Individual Time Calculation (min): 60 min    Short Term Goals: Week 4:  OT Short Term Goal 1 (Week 4): STG = LTGs due to remaining ELOS  Skilled Therapeutic Interventions/Progress Updates:    Treatment session with focus on visual scanning and RUE NMR.  Pt received upright in w/c declining bathing/dressing.  Engaged in squat pivot transfers mod assist this session with tactile cues and positioning of RLE to promote weight bearing.  Engaged in [place] in supine with focus on tone in elbow and shoulder, with pt reporting decrease in pain, increased ROM and decreased tone.  Engaged in visual scanning and weight bearing through RUE seated on EOB.  Incorporated visual scanning, sequencing, and letter recall while replicating written pattern with colored clothespins.  Increased weight bearing through RUE by having pt cross midline with Lt hand to promote weight shifting and weight bearing.  Pt demonstrated difficulty with naming letters when completing written pattern.  Pt continues to demonstrate decreased attention to Rt side of body/envrionment even neglecting Rt side of box in Lt visual field.  However able to scan to Rt to complete clothespin pattern.  Returned to room and left upright in w/c with quick release belt intact and all needs in reach.  Therapy Documentation Precautions:  Precautions Precautions: Fall Precaution Comments: right hemiplegia and neglect, extensor tone in the left elbow and in the shoulder Restrictions Weight Bearing Restrictions: No Pain:  Pt with c/o pain in RUE due to tone.  Reports it decreases with PROM.  See Function Navigator for Current Functional Status.   Therapy/Group: Individual Therapy  Simonne Come 05/07/2017, 12:44 PM

## 2017-05-07 NOTE — Progress Notes (Signed)
Occupational Therapy Session Note  Patient Details  Name: Ebony Williams MRN: [id number] Date of Birth: 1950-04-04  Today's Date: 05/07/2017 OT Individual Time: 7322-0254 OT Individual Time Calculation (min): 32 min   Skilled Therapeutic Interventions/Progress Updates:    Pt greeted supine in bed. Reported feeling fatigued and requesting tx in room. Worked on contracture mgt/tone reduction for R UE. Pt provided with MHP while OT completed gentle stretches/rocking and joint compressions to Rt elbow, wrist, and digits. Pt able to tolerate full digit extension with active assist and no pain! We listened to beach music for promoting relaxation, with pt singing to familiar songs with therapist. At end of tx pt was repositioned to protect hemiplegic side, able to tolerate forearm extended on bed vs across her chest. All needs within reach and bed alarm set.   Therapy Documentation Precautions:  Precautions Precautions: Fall Precaution Comments: right hemiplegia and neglect, extensor tone in the left elbow and in the shoulder Restrictions Weight Bearing Restrictions: No Vital Signs: Therapy Vitals Temp: 98 F (36.7 C) Temp Source: Oral Pulse Rate: 72 Resp: 18 BP: (!) 131/59 Patient Position (if appropriate): Lying Oxygen Therapy SpO2: 99 % O2 Device: Room Air Pain: With certain R UE movements, subsides with rest per report   ADL:      See Function Navigator for Current Functional Status.   Therapy/Group: Individual Therapy  [name] A Jaxyn Rout 05/07/2017, 4:17 PM

## 2017-05-07 NOTE — Progress Notes (Signed)
Speech Language Pathology Daily Session Note  Patient Details  Name: Ebony Williams MRN: [id number] Date of Birth: 08/30/1950  Today's Date: 05/07/2017 SLP Individual Time: 1000-1100 SLP Individual Time Calculation (min): 60 min  Short Term Goals: Week 4: SLP Short Term Goal 1 (Week 4): Pt will verbally convey her needs and wants to caregivers at the sentence level in 75% of opportunities with min assist verbal cues. SLP Short Term Goal 2 (Week 4): Pt will name basic, familiar objects for 75% accuracy with min assist verbal cues for word finding.  SLP Short Term Goal 3 (Week 4): Pt will locate items to the right of midline during basic, familiar tasks in 75% of opportunities with supervision verbal cues.  SLP Short Term Goal 4 (Week 4): Pt will consume dys 2 textures and thin liquids with mod I use of swallowing precautions over 2 consecutive sessions.  SLP Short Term Goal 5 (Week 4): Pt will selectively attend to basic, familiar tasks for 30 minute intervals with supervision verbal cues for redirection.   SLP Short Term Goal 6 (Week 4): Pt will consume therapeutic trials of dys 3 textures with supervision verbal cues for use of swallowing precautions.    Skilled Therapeutic Interventions: Skilled treatment session focused on communication goals and completion of education. SLP facilitated session by providing Max A to Mod A cues to name people in personal pictures. Education also provided to daughter on how to facilitate further communication once pt discharges to SNF. Uncertain if pt is discharging tomorrow. Education completed.      Function:  Eating Eating   Modified Consistency Diet: Yes Eating Assist Level: Supervision or verbal cues   Eating Set Up Assist For: Opening containers       Cognition Comprehension Comprehension assist level: Understands basic 90% of the time/cues < 10% of the time;Understands basic 75 - 89% of the time/ requires cueing 10 - 24% of the time   Expression   Expression assist level: Expresses basic 75 - 89% of the time/requires cueing 10 - 24% of the time. Needs helper to occlude trach/needs to repeat words.;Expresses basic 50 - 74% of the time/requires cueing 25 - 49% of the time. Needs to repeat parts of sentences.  Social Interaction Social Interaction assist level: Interacts appropriately 90% of the time - Needs monitoring or encouragement for participation or interaction.  Problem Solving Problem solving assist level: Solves basic 75 - 89% of the time/requires cueing 10 - 24% of the time  Memory Memory assist level: Recognizes or recalls 50 - 74% of the time/requires cueing 25 - 49% of the time    Pain Pain Assessment Pain Scale: 0-10 Pain Score: 0-No pain  Therapy/Group: Individual Therapy  Ebony Williams 05/07/2017, 11:27 AM

## 2017-05-07 NOTE — Progress Notes (Signed)
[facility] PHYSICAL MEDICINE & REHABILITATION     PROGRESS NOTE  Subjective/Complaints:  Pt seen lying in bed this MA.  She slept well overnight.  She states she had a good weekend.   ROS: Denies CP, SOB, N/V/D  Objective: Vital Signs: Blood pressure 135/65, pulse 66, temperature (!) 97 F (36.1 C), temperature source Axillary, resp. rate 18, height 5\' 1"  (1.549 m), weight 76.5 kg (168 lb 10.4 oz), SpO2 98 %. No results found. No results for input(s): WBC, HGB, HCT, PLT in the last 72 hours. No results for input(s): NA, K, CL, GLUCOSE, BUN, CREATININE, CALCIUM in the last 72 hours.  Invalid input(s): CO CBG (last 3)  Recent Labs    05/06/17 1654 05/06/17 2125 05/07/17 0622  GLUCAP 131* 120* 129*    Wt Readings from Last 3 Encounters:  04/18/17 76.5 kg (168 lb 10.4 oz)  04/06/17 74.5 kg (164 lb 3.9 oz)  02/28/17 79.4 kg (175 lb)    Physical Exam:  BP 135/65 (BP Location: Left Arm)   Pulse 66   Temp (!) 97 F (36.1 C) (Axillary)   Resp 18   Ht 5\' 1"  (1.549 m)   Wt 76.5 kg (168 lb 10.4 oz)   SpO2 98%   BMI 31.87 kg/m   Constitutional: No distress . Vital signs reviewed. HENT: Normocephalic, atraumatic. Eyes: EOMI, No discharge. Cardiovascular: RRR. No JVD    Respiratory: CTA Bilaterally. Normal effort    GI: BS +, non-distended  Musculoskeletal: No edema or tenderness in extremities  Neurological: Alert Dysphonia/Dysarthria Motor: LUE 4-4+/5 proximal to distal LLE: 4/5 hip flexion, 4+/5 knee extension, 4+/5 ADF RUE/RLE: 0/5 proximal to distal (unchanged) Increase in tone RUE/RLE Skin: Skin is warm and dry.  Psychiatric: Flat  Assessment/Plan: 1. 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 Physiatrist is providing close team supervision and 24 hour management of active medical problems listed below. Physiatrist and rehab  team continue to assess barriers to discharge/monitor patient progress toward functional and medical goals.  Function:  Bathing Bathing position   Position: Shower  Bathing parts Body parts bathed by patient: Right arm, Chest, Abdomen, Right upper leg, Left upper leg, Right lower leg, Left lower leg, Front perineal area Body parts bathed by helper: Left arm, Buttocks, Back  Bathing assist Assist Level: Touching or steadying assistance(Pt > 75%)      Upper Body Dressing/Undressing Upper body dressing   What is the patient wearing?: Pull over shirt/dress     Pull over shirt/dress - Perfomed by patient: Thread/unthread left sleeve, Put head through opening, Pull shirt over trunk Pull over shirt/dress - Perfomed by helper: Thread/unthread right sleeve        Upper body assist Assist Level: Touching or steadying assistance(Pt > 75%)      Lower Body Dressing/Undressing Lower body dressing   What is the patient wearing?: Pants, Non-skid slipper socks     Pants- Performed by patient: Pull pants up/down Pants- Performed by helper: Thread/unthread right pants leg, Thread/unthread left pants leg, Pull pants up/down(Pt able to thread R LE into pants, however required Mod A to maintain sitting balance) Non-skid slipper socks- Performed by patient: Don/doff right sock, Don/doff left sock Non-skid slipper socks- Performed by helper: Don/doff right sock, Don/doff left sock Socks - Performed by patient: Don/doff right sock, Don/doff left sock Socks - Performed by helper: Don/doff left sock Shoes - Performed  by patient: Don/doff right shoe, Don/doff left shoe Shoes - Performed by helper: Fasten right, Fasten left          Lower body assist Assist for lower body dressing: Touching or steadying assistance (Pt > 75%)      Toileting Toileting Toileting activity did not occur: No continent bowel/bladder event Toileting steps completed by patient: Adjust clothing prior to toileting Toileting  steps completed by helper: Adjust clothing prior to toileting, Performs perineal hygiene, Adjust clothing after toileting Toileting Assistive Devices: Grab bar or rail  Toileting assist Assist level: Touching or steadying assistance (Pt.75%)   Transfers Chair/bed transfer   Chair/bed transfer method: Stand pivot Chair/bed transfer assist level: Moderate assist (Pt 50 - 74%/lift or lower) Chair/bed transfer assistive device: Armrests Mechanical lift: Ecologist Ambulation activity did not occur: Safety/medical concerns   Max distance: 26ft  Assist level: Maximal assist (Pt 25 - 49%)   Wheelchair Wheelchair activity did not occur: Safety/medical concerns Type: Manual Max wheelchair distance: 150ft  Assist Level: Supervision or verbal cues  Cognition Comprehension Comprehension assist level: Understands basic 90% of the time/cues < 10% of the time  Expression Expression assist level: Expresses basic 75 - 89% of the time/requires cueing 10 - 24% of the time. Needs helper to occlude trach/needs to repeat words.  Social Interaction Social Interaction assist level: Interacts appropriately 90% of the time - Needs monitoring or encouragement for participation or interaction.  Problem Solving Problem solving assist level: Solves basic 75 - 89% of the time/requires cueing 10 - 24% of the time  Memory Memory assist level: Recognizes or recalls 50 - 74% of the time/requires cueing 25 - 49% of the time    Medical Problem List and Plan: 1.  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  DVT Prophylaxis/Anticoagulation: SCDs. Monitor for any signs of DVT 3. Pain Management: Tylenol as needed 4. Mood: Effexor 75 mg daily, Xanax 0.5 mg 3 times a day as needed   Amantadine 100 mg with breakfast and lunch started on 3/9, increased to 200  on 3/20.    Overall improving 5. Neuropsych: This patient is not capable of making decisions on his own behalf. 6. Skin/Wound Care: Routine skin checks 7. Fluids/Electrolytes/Nutrition: Routine I&O's 8. Hypertension.    HCTZ 12.5 mg daily. Changed to Hygroton 25 mg on 3/8 given increase in BUN/CR   Avapro 300 mg daily.    Norvasc 5 mg started on 3/21, increased to 10 mg on 3/27   Relatively controlled on 4/1 Vitals:   05/06/17 2309 05/07/17 0537  BP:  135/65  Pulse: 72 66  Resp: 16 18  Temp:  (!) 97 F (36.1 C)  SpO2: 97% 98%   9. Dysphagia. Diet advanced to D2/thins 10. Prediabetes:    CBGs before meals and at bedtime    Added sliding scale insulin for covg   Metformin 250 started on 3/20, increased to 500 on 3/25    CBG (last 3)  Recent Labs    05/06/17 1654 05/06/17 2125 05/07/17 0622  GLUCAP 131* 120* 129*    Relatively controlled 4/1 11. Transaminitis: Resolved   Continue to monitor 12. Leukocytosis: Resolved   WBCs 8.4 on 3/25   UA equivocal, Ucx no growth   CXR  Stable   Afebrile  13. Spasticity:  increasing tone RUE/RLE   continue ROM/splinting   Given sedation, stopped baclofen after 2 doses   Follow-up on potential for serial casting   Will consider botulinum toxin injection as outpatient 14. Hypokalemia   K+ 3.4 on 3/25   Daily supplementation increased on 3/26   Labs pending 15. Hyponatremia   Potassium 134 on 3/25   Labs pending   Continue to monitor  LOS (Days) 26 A FACE TO FACE EVALUATION WAS PERFORMED  Ankit [name] 05/07/2017 8:45 AM

## 2017-05-07 NOTE — Progress Notes (Signed)
Physical Therapy Session Note  Patient Details  Name: Ebony Williams MRN: [id number] Date of Birth: 1950-09-02  Today's Date: 05/07/2017 PT Individual Time:  -      Short Term Goals: Week 3:    STG = LTG Skilled Therapeutic Interventions/Progress Updates: Pt presented in bed completing breakfast with dgt and nsg present. After completing breakfast and administering meds dgt requesting to perform more hands on regarding transfers. Dgt also adv pt normally has bowl movement very soon after meal. Pt performed supine to sit with use of features and mod A to EOB. Pt expressed sense of urgency for bowel movement. PTA performed squat pivot to [facility] with minA for time management. Pt noted to have feet hanging once sitting on BSC, discussed with dgt lowering BSC for better positioning (+BM). Pt attempted sit to stand with HW requiring modA for better RLE positioning and tactile cues for facilitation of wt shift to midline. Pt noted to have strong posterior push once in standing with tactile cues for correction. Performed stand pivot back to bed modA. Discussed with dgt safe transfer stand pivot vs squat pivot. Performed squat pivot to and from w/c minA with PTA blocking RLE due to increased tone. PTA demonstrating to dgt safe placement of pt and cues to facilitate transfer. While pt sitting on bed pt with incontinent episode of BM. Used Stedy to transfer to [place] sit to stand in [place]. Pt able to maintain standing after completion of BM to allow PTA to perform peri-care. Pt returned to sitting at EOB and dgt demonstrated bed to w/c transfer via squat pivot with good technique. Pt remained in w/c at end of sessionand half lap tray placed with RUE positioned on pillow. Pt left with dgt in w/c with needs met.      Therapy Documentation Precautions:  Precautions Precautions: Fall Precaution Comments: right hemiplegia and neglect, extensor tone in the left elbow and in the shoulder Restrictions Weight Bearing  Restrictions: No   See Function Navigator for Current Functional Status.   Therapy/Group: Individual Therapy  Jamee Keach  Umberto Pavek, PTA  05/07/2017, 1:11 PM

## 2017-05-07 NOTE — Progress Notes (Signed)
Occupational Therapy Weekly Progress Note  Patient Details  Name: Ebony Williams MRN: [id number] Date of Birth: [birth year]  Beginning of progress report period: April 27, 2017 End of progress report period: May 07, 2017  Patient has met 3 of 4 short term goals.  Pt is making steady progress towards goals.  Pt is demonstrating increased arousal and alertness allowing for increased participation in therapy sessions.  Pt currently requires mod assist for squat pivot transfers with tactile cues for posture and positioning.  Pt currently requires min assist for bathing and mod assist for UB and LB dressing to due hypertonicity in RUE and RLE.  Pt continues to demonstrate impaired vision with Rt inattention, however able to scan to Rt to locate items with increased time and encouragement.  Pt's family unable to provide pt needs at current level, therefore plan to pursue SNF for further therapeutic intervention prior to d/c home.  Patient continues to demonstrate the following deficits: muscle weakness,decreased oxygen support,impaired timing and sequencing, abnormal tone, unbalanced muscle activation, motor apraxia, decreased coordination and decreased motor planning,decreased visual perceptual skills,decreased attention to right,decreased initiation, decreased attention, decreased awareness, decreased problem solving and decreased safety awarenessand decreased standing balance, decreased postural control, hemiplegia and decreased balance strategies and therefore will continue to benefit from skilled OT intervention to enhance overall performance with BADL and Reduce care partner burden.  Patient progressing toward long term goals..  Continue plan of care.  OT Short Term Goals Week 3:  OT Short Term Goal 1 (Week 3): Pt will complete toilet transfer with mod assist of 1 caregiver OT Short Term Goal 1 - Progress (Week 3): Met OT Short Term Goal 2 (Week 3): Pt will complete LB dressing with max  assist of one caregiver OT Short Term Goal 2 - Progress (Week 3): Met OT Short Term Goal 3 (Week 3): Pt will complete bathing with min assist OT Short Term Goal 3 - Progress (Week 3): Met OT Short Term Goal 4 (Week 3): Pt will complete UB dressing with min assist OT Short Term Goal 4 - Progress (Week 3): Progressing toward goal Week 4:  OT Short Term Goal 1 (Week 4): STG = LTGs due to remaining ELOS    See Function Navigator for Current Functional Status.   Therapy/Group: Individual Therapy  Simonne Come 05/07/2017, 8:22 AM

## 2017-05-07 NOTE — Discharge Summary (Signed)
NAME:  Ebony Williams, Ebony Williams                    ACCOUNT NO.:  MEDICAL RECORD NO.:  [id number]  LOCATION:                                 FACILITY:  PHYSICIAN:  Delice Lesch, MD        DATE OF BIRTH:  [birth year]  DATE OF ADMISSION:  04/11/2017 DATE OF DISCHARGE:  05/08/2017                              DISCHARGE SUMMARY   DISCHARGE DIAGNOSES: 1. Multifocal ischemic left middle cerebral artery and posterior     cerebral artery infarctions. 2. Sequential compression devices for deep venous thrombosis     prophylaxis. 3. Mood with depression. 4. Hypertension. 5. Dysphagia. 6. Prediabetes. 7. Leukocytosis, resolved. 8. Spasticity. 9. Hypokalemia resolved.  HISTORY OF PRESENT ILLNESS:  This is a 67 year old right-handed female with a history of hypertension, lives with spouse, reported to be independent prior to admission.  Presented on April 06, 2017, with right- sided weakness, left gaze, and aphasia.  CT of the head showed left basal ganglia infarction.  Subacute left-sided basal ganglia nonhemorrhagic infarction.  The patient did receive t-PA.  CT angiogram of head and neck with no stenosis or vascular process.  MRI showed acute multifocal ischemia, left MCA and PCA infarctions.  Echocardiogram with ejection fraction of 81%, grade 1 diastolic dysfunction without emboli. Neurology consulted, maintained on aspirin as well as Plavix for CVA prophylaxis.  Dysphagia #1 nectar thick liquid diet.  The patient was admitted for a comprehensive rehab program.  PAST MEDICAL HISTORY:  See discharge diagnoses.  SOCIAL HISTORY:  Lives with spouse, independent prior to admission.  FUNCTIONAL STATUS UPON ADMISSION TO REHAB SERVICES:  +2 physical assist sit-to-stand, moderate assist sit-to-supine, max-to-total assist with activities of daily living.  PHYSICAL EXAMINATION:  VITAL SIGNS:  Blood pressure 164/77, pulse 72, temperature 98, and respirations 18. GENERAL:  This was an alert female.  She  kept her eyes closed during exam, essentially remained nonverbal. HEENT:  Pupils are reactive to light. NECK:  Supple.  Nontender.  No JVD. CARDIAC:  Rate controlled. ABDOMEN:  Soft, nontender.  Good bowel sounds.  Dense right-sided weakness.  REHABILITATION HOSPITAL COURSE:  The patient was admitted to Inpatient Rehab Services with therapies initiated on a three-hour daily basis, consisting of physical therapy, occupational therapy, speech therapy, and rehabilitation nursing.  The following issues were addressed during the patient's rehabilitation stay.  Pertaining to Ebony Williams left MCA and PCA infarctions remained stable, maintained on aspirin and Plavix therapy.  She would follow up with Neurology Services.  SCDs for DVT prophylaxis.  Mood, placed on amantadine 200 mg, increased to twice daily, as well as the use of Effexor 75 mg daily.  She was using Xanax on a limited basis for anxiety as needed.  Blood pressures overall controlled on Norvasc, Hygroton, and Avapro.  Pre-diabetes.  Maintained on low-dose Glucophage.  Blood sugars overall acceptable.  Initial bouts of hypokalemia, resolved with potassium supplement.  The patient received weekly collaborative interdisciplinary team conferences to discuss estimated length of stay, family teaching, any barriers to discharge.  The patient is supine-to-sit with minimal assist, sit-to- stand from edge of bed minimal assist using hemi walker.  Noted to have some  incontinent bowel episodes at times with routine toileting.  Stand pivot transfers, bedside commode with moderate assist.  Activities of daily living and homemaking, currently requires moderate assist for squat pivot transfers with tactile cues for posture and positioning, requires minimal assist for bathing and moderate assist for upper body and lower body dressing.  During her course, at times she needed some encouragement to participate, but continued to improve.  A followup  CT of the head on April 16, 2017, showed no acute changes.  Her diet had been advanced to a dysphagia #2 thin liquid diet.  It was felt that the family could not provide the necessary supervision at home with bed becoming available on May 08, 2017.  DISCHARGE MEDICATIONS:  Included: 1. Amantadine 200 mg p.o. b.i.d. 2. Norvasc 10 mg p.o. daily. 3. Aspirin 81 mg p.o. daily. 4. Hygroton 25 mg p.o. daily. 5. Plavix 75 mg p.o. daily. 6. Avapro 300 mg p.o. daily. 7. Glucophage 500 mg p.o. daily. 8. Protonix 40 mg p.o. daily. 9. Effexor 75 mg p.o. daily. 10.Xanax 0.5 mg p.o. t.i.d. as needed.  DIET:  Her diet was a dysphagia #2, thin liquid diet.  FOLLOWUP INSTRUCTIONS:  She would follow up with Dr. Delice Lesch at the Outpatient Rehab Service office as advised; Dr. Antony Contras, Neurology Services, call for appointment in 6 weeks.     Ebony Williams, P.A.   ______________________________ Delice Lesch, MD    DA/MEDQ  D:  05/07/2017  T:  05/07/2017  Job:  [id number]  cc:   Halford Chessman, M.D. Delice Lesch, MD Pramod P. Leonie Man, MD

## 2017-05-07 NOTE — Discharge Summary (Signed)
Discharge summary job [id number]

## 2017-05-08 ENCOUNTER — Inpatient Hospital Stay (HOSPITAL_COMMUNITY): Payer: Medicare Other | Admitting: Occupational Therapy

## 2017-05-08 ENCOUNTER — Inpatient Hospital Stay (HOSPITAL_COMMUNITY): Payer: Medicare Other | Admitting: Physical Therapy

## 2017-05-08 LAB — GLUCOSE, CAPILLARY
Glucose-Capillary: 113 mg/dL — ABNORMAL HIGH (ref 65–99)
Glucose-Capillary: 110 mg/dL — ABNORMAL HIGH (ref 65–99)

## 2017-05-08 NOTE — Progress Notes (Signed)
Speech Language Pathology Discharge Summary  Patient Details  Name: GARNETTE GREB MRN: [id number] Date of Birth: [birth year]   Patient has met 9 of 9 long term goals.  Patient to discharge at [facility] level.  Reasons goals not met:     Clinical Impression/Discharge Summary:  Pt has made significant functional gains while inpatient and is discharging having met 9 out of 9 long term goals.  Pt is overall min assist for functional tasks due to expressive aphasia and mild-moderate cognitive deficits.  Pt's diet has been advanced to dys 2 textures and thin liquids with mod I use of swallowing precautions.  Pt and family education is complete for this venue of care.  Would recommend additional education at SNF prior to discharge home.  Would strongly recommend intensive ST follow up at next level of care to address dysphagia and cognitive-linguistic goals.    Care Partner:  Caregiver Able to Provide Assistance: (SNF)  Type of Caregiver Assistance: (SNF)  Recommendation:  Home Health SLP;Outpatient SLP;24 hour supervision/assistance  Rationale for SLP Follow Up: Maximize cognitive function and independence;Maximize swallowing safety;Maximize functional communication;Reduce caregiver burden   Equipment: none recommended by SLP    Reasons for discharge: Discharged from hospital   Patient/Family Agrees with Progress Made and Goals Achieved: Yes    Ranie Chinchilla, Selinda Orion 05/08/2017, 4:29 PM

## 2017-05-08 NOTE — Progress Notes (Signed)
Occupational Therapy Discharge Summary  Patient Details  Name: Ebony Williams MRN: [id number] Date of Birth: 09-30-1950  Patient has met 8 of 12 long term goals due to improved activity tolerance, improved balance, postural control, ability to compensate for deficits and improved awareness.  Patient to discharge at overall Mod Assist level for transfers and LB dressing; Min Assist bathing, UB dressing, and grooming.  Patient's care partner unavailable to provide the necessary physical and cognitive assistance at discharge.  Patient's husband and daughter have been very supportive and present during 43 of therapy sessions,however are unable to provide assist at pt current level.  Reasons goals not met: Pt continues to require mod assist for bathroom transfers, LB dressing, and toileting tasks due to decreased trunk control and tone in RLE.  Recommendation:  Patient will benefit from ongoing skilled OT services in skilled nursing facility setting to continue to advance functional skills in the area of BADL and Reduce care partner burden.  Equipment: No equipment provided  Reasons for discharge: treatment goals met and discharge from hospital  Patient/family agrees with progress made and goals achieved: Yes  OT Discharge Precautions/Restrictions  Precautions Precautions: Fall Precaution Comments: right hemiplegia and neglect, extensor tone in the left elbow and in the shoulder Restrictions Weight Bearing Restrictions: No General   Vital Signs Therapy Vitals Temp: 97.7 F (36.5 C) Temp Source: Oral Pulse Rate: 82 Resp: 18 BP: 132/61 Patient Position (if appropriate): Lying Oxygen Therapy SpO2: 98 % O2 Device: Room Air Pain Pain Assessment Pain Score: 0-No pain ADL  See Function Navigator Vision Baseline Vision/History: Wears glasses Wears Glasses: At all times Patient Visual Report: No change from baseline Vision Assessment?: Vision impaired- to be further tested  in functional context;Yes Eye Alignment: Within Functional Limits Ocular Range of Motion: Restricted on the right Alignment/Gaze Preference: Gaze left Tracking/Visual Pursuits: Decreased smoothness of horizontal tracking;Requires cues, head turns, or add eye shifts to track;Decreased smoothness of eye movement to RIGHT superior field;Decreased smoothness of eye movement to RIGHT inferior field Saccades: Overshoots Additional Comments: able to scan to Rt to locate stimulus with increased time.  Rt inattention with confrontation testing Perception  Perception: Impaired Inattention/Neglect: Does not attend to right visual field;Does not attend to right side of body Comments: Rt inattention with confrontation testing Praxis Praxis: Impaired Praxis Impairment Details: Motor planning Cognition Overall Cognitive Status: Impaired/Different from baseline Arousal/Alertness: Awake/alert Orientation Level: Oriented X4 Awareness: Impaired Awareness Impairment: Emergent impairment Problem Solving: Impaired Safety/Judgment: Impaired Sensation Sensation Light Touch: Impaired Detail Light Touch Impaired Details: Impaired RUE;Impaired RLE Proprioception Impaired Details: Impaired RLE;Impaired RUE Coordination Gross Motor Movements are Fluid and Coordinated: No Fine Motor Movements are Fluid and Coordinated: No Coordination and Movement Description: Rt hemiplegia, increased tone in Rt UE and LE Extremity/Trunk Assessment RUE Assessment RUE Assessment: Exceptions to [facility] RUE AROM (degrees) Overall AROM Right Upper Extremity: Deficits(increased tone in shoulder and elbow) RUE PROM (degrees) Overall PROM Right Upper Extremity: Within functional limits for tasks performed(able to tolerate shoulder flexion to 150*) LUE Assessment LUE Assessment: Within Functional Limits   See Function Navigator for Current Functional Status.  Simonne Come 05/08/2017, 4:15 PM

## 2017-05-08 NOTE — Progress Notes (Signed)
Occupational Therapy Session Note  Patient Details  Name: Ebony Williams MRN: [id number] Date of Birth: 1950/11/13  Today's Date: 05/08/2017 OT Individual Time: 4098-1191 OT Individual Time Calculation (min): 60 min    Short Term Goals: Week 4:  OT Short Term Goal 1 (Week 4): STG = LTGs due to remaining ELOS  Skilled Therapeutic Interventions/Progress Updates:    Completed ADL retraining with mod assist for transfers and min assist for bathing and UB dressing.  Pt received upright in bed with daughter present.  Pt asking questions regarding next venue of care and what to expect re therapy.  Therapist answered questions to pt satisfaction.  Completed squat pivot transfer bed > w/c > tub bench in room shower with mod assist.  Bathing completed at sit > stand level with pt able to bathe with min guard for sitting balance and assist for positioning to allow pt to wash under Rt arm.  Therapist washed back and buttocks in standing.  Utilized figure 4 position to wash lower legs with therapist setting up and maintaining position with RLE.  Dressing completed at sit > stand level from w/c with pt able to recall hemi-dressing technique, however requiring assist to thread RUE into shirt.  Pt able to thread pants with increased time utilizing figure 4 position and able to pull pants up on Lt side, requiring assist to maintain standing balance and pull pants over Rt hip.  Educated pt and daughter on self-ROM and stretching for RUE (provided HEP handout) as well as visual deficits and activities to continue to address vision.  Pt and daughter report understanding.  Therapy Documentation Precautions:  Precautions Precautions: Fall Precaution Comments: right hemiplegia and neglect, extensor tone in the left elbow and in the shoulder Restrictions Weight Bearing Restrictions: No General:   Vital Signs: Therapy Vitals Temp: 97.7 F (36.5 C) Temp Source: Oral Resp: 18 BP: (!) 142/73 Patient Position (if  appropriate): Lying Oxygen Therapy SpO2: 98 % O2 Device: Room Air Pain:  Pt with no c/o pain  See Function Navigator for Current Functional Status.   Therapy/Group: Individual Therapy  Simonne Come 05/08/2017, 9:56 AM

## 2017-05-08 NOTE — Progress Notes (Signed)
Pt DC to [facility]. Called and gave report to nurse. tranported via [facility]. Family took belongings

## 2017-05-08 NOTE — Progress Notes (Signed)
Physical Therapy Discharge Summary  Patient Details  Name: Ebony Williams MRN: [id number] Date of Birth: 14-Jul-1950  Today's Date: 05/08/2017 PT Individual Time: 0930-1015 PT Individual Time Calculation (min): 45 min   Pt performed w/c mobility with hemi technique x 150' with supervision.  Gait with hemiwalker 25' x 2 with mod A for wt shifts, Rt LE control and placement and Rt ankle in neutral to prevent injury.  Stair negotiation 6'' step x 1 initially with mod A with Lt handrail.  Then pt performed 2 x 6'' step with 1 handrail mod A.  Pt/daughter given HEP for ROM and strength to continue at SNF.  Pt and daughter state they feel comfortable with d/c and state they are pleased with progress.  Pt left in room with needs at hand, family present.  Patient has met 5 of 7 long term goals due to improved activity tolerance, improved balance, improved postural control, increased strength, increased range of motion, ability to compensate for deficits, functional use of  right upper extremity and right lower extremity, improved attention, improved awareness and improved coordination.  Patient to discharge at a wheelchair level [place].     Reasons goals not met: car and home w/c goals n/a due to pt d/c to SNF  Recommendation:  Patient will benefit from ongoing skilled PT services in skilled nursing facility setting to continue to advance safe functional mobility, address ongoing impairments in strength, balance, gait, cognition, NMR, and minimize fall risk.  Equipment: No equipment provided  Reasons for discharge: treatment goals met and discharge from hospital  Patient/family agrees with progress made and goals achieved: Yes  PT Discharge Precautions/Restrictions Precautions Precautions: Fall Restrictions Weight Bearing Restrictions: No Pain Pain Assessment Pain Score: 0-No pain  Cognition Overall Cognitive Status: Impaired/Different from baseline Awareness: Impaired Awareness  Impairment: Emergent impairment Problem Solving: Impaired Safety/Judgment: Impaired Sensation Sensation Light Touch Impaired Details: Impaired RUE;Impaired RLE Proprioception Impaired Details: Impaired RLE;Impaired RUE Coordination Gross Motor Movements are Fluid and Coordinated: No Fine Motor Movements are Fluid and Coordinated: No Coordination and Movement Description: Rt hemiplegia, increased tone in Rt UE and LE Motor  Motor Motor: Abnormal tone;Abnormal postural alignment and control;Hemiplegia Motor - Discharge Observations: Rt hemiplegia with increased tone in Rt UE and LE   Trunk/Postural Assessment  Cervical Assessment Cervical Assessment: Within Functional Limits Thoracic Assessment Thoracic Assessment: (rounded shoulders) Lumbar Assessment Lumbar Assessment: (posterior pelvic tilt) Postural Control Postural Control: Deficits on evaluation Righting Reactions: delayed  Balance Static Sitting Balance Static Sitting - Level of Assistance: 5: Stand by assistance Dynamic Sitting Balance Sitting balance - Comments: supervision Static Standing Balance Static Standing - Level of Assistance: 4: Min assist Dynamic Standing Balance Dynamic Standing - Comments: mod A with hemiwalker Extremity Assessment      RLE Strength RLE Overall Strength: (improving strength in hip fle/ext and knee flex/ext) RLE Tone RLE Tone: Hypertonic LLE Assessment LLE Assessment: Within Functional Limits   See Function Navigator for Current Functional Status.  Sholom Dulude 05/08/2017, 10:23 AM

## 2017-05-08 NOTE — Progress Notes (Signed)
[place] PHYSICAL MEDICINE & REHABILITATION     PROGRESS NOTE  Subjective/Complaints:  Pt seen lying in bed this AM.  She slept well overnight.  She is alert and eating breakfast this AM.  She is aware that she will be going to SNF.    ROS: Denies CP, SOB, N/V/D  Objective: Vital Signs: Blood pressure (!) 142/73, pulse 72, temperature 97.7 F (36.5 C), temperature source Oral, resp. rate 18, height 5\' 1"  (1.549 m), weight 76.5 kg (168 lb 10.4 oz), SpO2 98 %. No results found. Recent Labs    05/07/17 0935  WBC 8.7  HGB 15.7*  HCT 47.7*  PLT 263   Recent Labs    05/07/17 0935  NA 135  K 3.5  CL 100*  GLUCOSE 221*  BUN 16  CREATININE 0.75  CALCIUM 9.2   CBG (last 3)  Recent Labs    05/07/17 1712 05/07/17 2058 05/08/17 0638  GLUCAP 88 133* 113*    Wt Readings from Last 3 Encounters:  04/18/17 76.5 kg (168 lb 10.4 oz)  04/06/17 74.5 kg (164 lb 3.9 oz)  02/28/17 79.4 kg (175 lb)    Physical Exam:  BP (!) 142/73 (BP Location: Left Arm)   Pulse 72   Temp 97.7 F (36.5 C) (Oral)   Resp 18   Ht 5\' 1"  (1.549 m)   Wt 76.5 kg (168 lb 10.4 oz)   SpO2 98%   BMI 31.87 kg/m   Constitutional: No distress . Vital signs reviewed. HENT: Normocephalic, atraumatic. Eyes: EOMI, No discharge. Cardiovascular: RRR. No JVD    Respiratory: CTA Bilaterally. Normal effort    GI: BS +, non-distended  Musculoskeletal: No edema or tenderness in extremities  Neurological: Alert and oriented x2. Dysphonia/Dysarthria Motor: LUE 4-4+/5 proximal to distal LLE: 4/5 hip flexion, 4+/5 knee extension, 4+/5 ADF RUE/RLE: 0/5 proximal to distal (unchanged) Increase in tone RUE/RLE (unchanged) Skin: Skin is warm and dry.  Psychiatric: Flat  Assessment/Plan: 1. 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 Physiatrist is providing close team  supervision and 24 hour management of active medical problems listed below. Physiatrist and rehab team continue to assess barriers to discharge/monitor patient progress toward functional and medical goals.  Function:  Bathing Bathing position   Position: Shower  Bathing parts Body parts bathed by patient: Right arm, Chest, Abdomen, Right upper leg, Left upper leg, Right lower leg, Left lower leg, Front perineal area Body parts bathed by helper: Left arm, Buttocks, Back  Bathing assist Assist Level: Touching or steadying assistance(Pt > 75%)      Upper Body Dressing/Undressing Upper body dressing   What is the patient wearing?: Pull over shirt/dress     Pull over shirt/dress - Perfomed by patient: Thread/unthread left sleeve, Put head through opening, Pull shirt over trunk Pull over shirt/dress - Perfomed by helper: Thread/unthread right sleeve        Upper body assist Assist Level: Touching or steadying assistance(Pt > 75%)      Lower Body Dressing/Undressing Lower body dressing   What is the patient wearing?: Pants, Non-skid slipper socks     Pants- Performed by patient: Pull pants up/down Pants- Performed by helper: Thread/unthread right pants leg, Thread/unthread left pants leg, Pull pants up/down(Pt able to thread R LE into pants, however required Mod A to maintain sitting balance) Non-skid slipper socks- Performed by patient: Don/doff right sock, Don/doff left  sock Non-skid slipper socks- Performed by helper: Don/doff right sock, Don/doff left sock Socks - Performed by patient: Don/doff right sock, Don/doff left sock Socks - Performed by helper: Don/doff left sock Shoes - Performed by patient: Don/doff right shoe, Don/doff left shoe Shoes - Performed by helper: Fasten right, Fasten left          Lower body assist Assist for lower body dressing: Touching or steadying assistance (Pt > 75%)      Toileting Toileting Toileting activity did not occur: No continent  bowel/bladder event Toileting steps completed by patient: Adjust clothing prior to toileting Toileting steps completed by helper: Adjust clothing prior to toileting, Performs perineal hygiene, Adjust clothing after toileting Toileting Assistive Devices: Grab bar or rail  Toileting assist Assist level: Two helpers   Transfers Chair/bed transfer   Chair/bed transfer [place]: Stand pivot Chair/bed transfer assist level: Moderate assist (Pt 50 - 74%/lift or lower) Chair/bed transfer assistive device: Armrests Mechanical lift: Stedy   Locomotion Ambulation Ambulation activity did not occur: Safety/medical concerns   Max distance: 68ft  Assist level: Maximal assist (Pt 25 - 49%)   Wheelchair Wheelchair activity did not occur: Safety/medical concerns Type: Manual Max wheelchair distance: 127ft  Assist Level: Supervision or verbal cues  Cognition Comprehension Comprehension assist level: Understands basic 90% of the time/cues < 10% of the time  Expression Expression assist level: Expresses basic needs/ideas: With no assist  Social Interaction Social Interaction assist level: Interacts appropriately 75 - 89% of the time - Needs redirection for appropriate language or to initiate interaction.  Problem Solving Problem solving assist level: Solves basic 90% of the time/requires cueing < 10% of the time  Memory Memory assist level: Recognizes or recalls 90% of the time/requires cueing < 10% of the time    Medical Problem List and Plan: 1.  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  DVT Prophylaxis/Anticoagulation: SCDs. Monitor for any signs of DVT 3. Pain Management: Tylenol as needed 4. Mood: Effexor 75 mg daily, Xanax 0.5 mg 3 times a day as needed   Amantadine 100 mg with breakfast and lunch started on 3/9, increased to 200 on 3/20.     Overall improving 5. Neuropsych: This patient is not capable of making decisions on his own behalf. 6. Skin/Wound Care: Routine skin checks 7. Fluids/Electrolytes/Nutrition: Routine I&O's 8. Hypertension.    HCTZ 12.5 mg daily. Changed to [place] 25 mg on 3/8 given increase in BUN/CR   Avapro 300 mg daily.    Norvasc 5 mg started on 3/21, increased to 10 mg on 3/27   Relatively controlled on 4/2 Vitals:   05/07/17 1516 05/08/17 0641  BP: (!) 131/59 (!) 142/73  Pulse: 72   Resp: 18 18  Temp: 98 F (36.7 C) 97.7 F (36.5 C)  SpO2: 99% 98%   9. Dysphagia. Diet advanced to D2/thins 10. Prediabetes:    CBGs before meals and at bedtime    Added sliding scale insulin for covg   Metformin 250 started on 3/20, increased to 500 on 3/25    CBG (last 3)  Recent Labs    05/07/17 1712 05/07/17 2058 05/08/17 0638  GLUCAP 88 133* 113*    Relatively controlled 4/2 11. Transaminitis: Resolved   Continue to monitor 12. Leukocytosis: Resolved   WBCs 8.7 on 4/1  UA equivocal, Ucx no growth   CXR  Stable   Afebrile  13. Spasticity:   increasing tone RUE/RLE   continue ROM/splinting   Given sedation, stopped baclofen after 2 doses   Follow-up on potential for serial casting   Will consider botulinum toxin injection as outpatient 14. Hypokalemia   K+ 3.5 on 4/1   Daily supplementation increased on 3/26 15. Hyponatremia   Potassium 135 on 4/1   Continue to monitor  LOS (Days) 27 A FACE TO FACE EVALUATION WAS PERFORMED  Norell Brisbin [name] 05/08/2017 8:12 AM

## 2017-05-09 NOTE — Progress Notes (Signed)
Social Work Discharge Note  The overall goal for the admission was met for:   Discharge location: No - Pt required more rehabilitation at SNF level. Pt to [facility].  Length of Stay: Yes - 27 days  Discharge activity level: Yes, downgraded goals - moderate assistance  Home/community participation: No - to SNF  Services provided included: MD, RD, PT, OT, SLP, RN, Pharmacy, Neuropsych and SW  Financial Services: Private Insurance: [employer]  Follow-up services arranged: Other: Pt transferred to [facility] for continued rehab.  Comments (or additional information): Pt transferred via [facility] to [facility] for continued rehab at the SNF level of care.  Pt's family was in agreement and dtr had researched facilities thoroughly.   Patient/Family verbalized understanding of follow-up arrangements: Yes  Individual responsible for coordination of the follow-up plan: pt's dtr with her husband's approval  Confirmed correct DME delivered: Nonna Renninger, [name] 05/09/2017    Geneive Sandstrom, [name]

## 2017-06-07 ENCOUNTER — Inpatient Hospital Stay: Payer: Medicare Other | Admitting: Physical Medicine & Rehabilitation

## 2017-06-07 ENCOUNTER — Encounter: Payer: Medicare Other | Attending: Physical Medicine & Rehabilitation | Admitting: Physical Medicine & Rehabilitation

## 2017-06-07 ENCOUNTER — Encounter: Payer: Self-pay | Admitting: Physical Medicine & Rehabilitation

## 2017-06-07 VITALS — BP 108/67 | HR 86 | Ht 63.0 in

## 2017-06-07 DIAGNOSIS — I10 Essential (primary) hypertension: Secondary | ICD-10-CM | POA: Diagnosis not present

## 2017-06-07 DIAGNOSIS — Z8249 Family history of ischemic heart disease and other diseases of the circulatory system: Secondary | ICD-10-CM | POA: Diagnosis not present

## 2017-06-07 DIAGNOSIS — R5383 Other fatigue: Secondary | ICD-10-CM | POA: Diagnosis not present

## 2017-06-07 DIAGNOSIS — Z9889 Other specified postprocedural states: Secondary | ICD-10-CM | POA: Diagnosis not present

## 2017-06-07 DIAGNOSIS — I69398 Other sequelae of cerebral infarction: Secondary | ICD-10-CM | POA: Diagnosis not present

## 2017-06-07 DIAGNOSIS — G8191 Hemiplegia, unspecified affecting right dominant side: Secondary | ICD-10-CM

## 2017-06-07 DIAGNOSIS — Z09 Encounter for follow-up examination after completed treatment for conditions other than malignant neoplasm: Secondary | ICD-10-CM | POA: Diagnosis present

## 2017-06-07 DIAGNOSIS — Z833 Family history of diabetes mellitus: Secondary | ICD-10-CM | POA: Insufficient documentation

## 2017-06-07 DIAGNOSIS — R269 Unspecified abnormalities of gait and mobility: Secondary | ICD-10-CM | POA: Diagnosis not present

## 2017-06-07 DIAGNOSIS — Z87442 Personal history of urinary calculi: Secondary | ICD-10-CM | POA: Insufficient documentation

## 2017-06-07 DIAGNOSIS — I69320 Aphasia following cerebral infarction: Secondary | ICD-10-CM | POA: Diagnosis not present

## 2017-06-07 DIAGNOSIS — Z9049 Acquired absence of other specified parts of digestive tract: Secondary | ICD-10-CM | POA: Diagnosis not present

## 2017-06-07 DIAGNOSIS — I69351 Hemiplegia and hemiparesis following cerebral infarction affecting right dominant side: Secondary | ICD-10-CM

## 2017-06-07 NOTE — Progress Notes (Signed)
Subjective:    Patient ID: Ebony Williams, female    DOB: [birth year], 67 y.o.   MRN: [id number]  HPI 67 year old right-handed female with a history of hypertension presents for hospital follow up after receiving CIR for left basal ganglia infarction.   DATE OF ADMISSION:  04/11/2017 DATE OF DISCHARGE:  05/08/2017  Daughter present, who provides much of history. She was discharged and is still at [facility]. She is now eating a regular diet without issues. She does not have a follow up appointment for Neurology. She is wearing her braced. Her energy is improving. BP is controlled. She states she is having her CBGs checked. Daughter notes involuntary movement in LUE/LLE.  She had a fall out bed one time when she had a UTI.  She also had a yeast infection.   Pain Inventory Average Pain 5 Pain Right Now 5 My pain is intermittent and dull  In the last 24 hours, has pain interfered with the following? General activity 8 Relation with others 8 Enjoyment of life 5 What TIME of day is your pain at its worst? . Sleep (in general) Fair  Pain is worse with: walking, bending, sitting, inactivity and standing Pain improves with: rest Relief from Meds: 10  Mobility walk with assistance use a walker do you drive?  no  Function retired I need assistance with the following:  dressing, bathing, toileting, meal prep, household duties and shopping  Neuro/Psych bladder control problems bowel control problems weakness tremor trouble walking dizziness confusion depression anxiety  Prior Studies Any changes since last visit?  no  Physicians involved in your care Any changes since last visit?  no   Family History  Problem Relation Age of Onset  . Diabetes Mother   . Hypertension Mother    Social History   Socioeconomic History  . Marital status: Married    Spouse name: Not on file  . Number of children: Not on file  . Years of education: Not on file  . Highest education level:  Not on file  Occupational History  . Not on file  Social Needs  . Financial resource strain: Not on file  . Food insecurity:    Worry: Not on file    Inability: Not on file  . Transportation needs:    Medical: Not on file    Non-medical: Not on file  Tobacco Use  . Smoking status: Never Smoker  . Smokeless tobacco: Never Used  Substance and Sexual Activity  . Alcohol use: Yes    Comment: very rare  . Drug use: No  . Sexual activity: Not on file  Lifestyle  . Physical activity:    Days per week: Not on file    Minutes per session: Not on file  . Stress: Not on file  Relationships  . Social connections:    Talks on phone: Not on file    Gets together: Not on file    Attends religious service: Not on file    Active member of club or organization: Not on file    Attends meetings of clubs or organizations: Not on file    Relationship status: Not on file  Other Topics Concern  . Not on file  Social History Narrative  . Not on file   Past Surgical History:  Procedure Laterality Date  . CHOLECYSTECTOMY     laparoscopic  . COLONOSCOPY WITH PROPOFOL N/A 01/12/2015   Procedure: COLONOSCOPY WITH PROPOFOL;  Surgeon: Garlan Fair, MD;  Location: [facility];  Service: Endoscopy;  Laterality: N/A;  . TUBAL LIGATION     Past Medical History:  Diagnosis Date  . History of kidney stones    x1  . Hypertension    BP 108/67   Pulse 86   Ht 5\' 3"  (1.6 m)   SpO2 95%   BMI 29.88 kg/m   Opioid Risk Score:   Fall Risk Score:  `1  Depression screen PHQ 2/9  No flowsheet data found.   Review of Systems  Constitutional: Negative.   HENT: Negative.   Eyes: Negative.   Respiratory: Negative.   Cardiovascular: Negative.   Gastrointestinal: Positive for nausea.  Endocrine: Negative.   Genitourinary: Negative.   Musculoskeletal: Positive for gait problem.  Skin: Negative.   Allergic/Immunologic: Negative.   Neurological: Positive for dizziness, tremors and weakness.    Hematological: Negative.   Psychiatric/Behavioral: Positive for confusion, dysphoric mood and self-injury.  All other systems reviewed and are negative.     Objective:   Physical Exam Constitutional: No distress . Vital signs reviewed. HENT: Normocephalic, atraumatic. Eyes: EOMI, No discharge. Cardiovascular: RRR. No JVD    Respiratory: CTA Bilaterally. Normal effort    GI: BS +, non-distended  Musculoskeletal: No edema or tenderness in extremities  Neurological: Alert and oriented x3. Dysphonia/Dysarthria Motor: LUE 4-4+/5 proximal to distal LLE: 4-4+/5 hip flexion, 4+/5 knee extension, 4+/5 ADF RUE/RLE: 0/5 proximal to distal MAS: RUE: elbow flex/wrist flex, finger flexor 1/4 RLE: 1+/4 Skin: Skin is warm and dry.  Psychiatric: Flat    Assessment & Plan:  67 year old right-handed female with a history of hypertension presents for hospital follow up after receiving CIR for left basal ganglia infarction.   1. 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 Mood/Fatigue  Improved  Wean Amantadine to 100mg  with breakfast and lunch for 3 days, then d/c  3. Hypertension.  Daughter described orthostatic vs. Vasovagal symptoms  Wean meds, start with Hygrontin  4. Gait abnormality  Cont therapies  5. Spasticity late effect of CVA:  Cont bracing  Cont therapies  Will schedule for Botox: Right Biceps 70 units      Right FCR 40 units      Right FDS 40 units       Right Gastroc 50 units  >40 minutes spent with patient and daughter, with >30 minutes in counseling regarding above mentioned

## 2017-06-16 ENCOUNTER — Inpatient Hospital Stay (HOSPITAL_COMMUNITY)
Admission: EM | Admit: 2017-06-16 | Discharge: 2017-06-22 | DRG: 064 | Disposition: A | Discharge status: Skilled Nursing Facility | Payer: Medicare Other | Attending: Family Medicine | Admitting: Family Medicine

## 2017-06-16 ENCOUNTER — Encounter (HOSPITAL_COMMUNITY): Payer: Self-pay | Admitting: Emergency Medicine

## 2017-06-16 ENCOUNTER — Other Ambulatory Visit (HOSPITAL_COMMUNITY): Payer: Medicare Other

## 2017-06-16 ENCOUNTER — Emergency Department (HOSPITAL_COMMUNITY): Payer: Medicare Other

## 2017-06-16 ENCOUNTER — Inpatient Hospital Stay (HOSPITAL_COMMUNITY): Payer: Medicare Other

## 2017-06-16 ENCOUNTER — Other Ambulatory Visit: Payer: Self-pay

## 2017-06-16 DIAGNOSIS — R52 Pain, unspecified: Secondary | ICD-10-CM

## 2017-06-16 DIAGNOSIS — R1310 Dysphagia, unspecified: Secondary | ICD-10-CM | POA: Diagnosis present

## 2017-06-16 DIAGNOSIS — G40909 Epilepsy, unspecified, not intractable, without status epilepticus: Secondary | ICD-10-CM | POA: Diagnosis present

## 2017-06-16 DIAGNOSIS — B961 Klebsiella pneumoniae [K. pneumoniae] as the cause of diseases classified elsewhere: Secondary | ICD-10-CM | POA: Diagnosis present

## 2017-06-16 DIAGNOSIS — W050XXA Fall from non-moving wheelchair, initial encounter: Secondary | ICD-10-CM | POA: Diagnosis present

## 2017-06-16 DIAGNOSIS — I69312 Visuospatial deficit and spatial neglect following cerebral infarction: Secondary | ICD-10-CM | POA: Diagnosis not present

## 2017-06-16 DIAGNOSIS — T40605A Adverse effect of unspecified narcotics, initial encounter: Secondary | ICD-10-CM | POA: Diagnosis present

## 2017-06-16 DIAGNOSIS — R29898 Other symptoms and signs involving the musculoskeletal system: Secondary | ICD-10-CM | POA: Diagnosis present

## 2017-06-16 DIAGNOSIS — E119 Type 2 diabetes mellitus without complications: Secondary | ICD-10-CM | POA: Diagnosis present

## 2017-06-16 DIAGNOSIS — F329 Major depressive disorder, single episode, unspecified: Secondary | ICD-10-CM | POA: Diagnosis present

## 2017-06-16 DIAGNOSIS — F05 Delirium due to known physiological condition: Secondary | ICD-10-CM | POA: Diagnosis present

## 2017-06-16 DIAGNOSIS — E663 Overweight: Secondary | ICD-10-CM | POA: Diagnosis present

## 2017-06-16 DIAGNOSIS — I69351 Hemiplegia and hemiparesis following cerebral infarction affecting right dominant side: Secondary | ICD-10-CM | POA: Diagnosis not present

## 2017-06-16 DIAGNOSIS — F4322 Adjustment disorder with anxiety: Secondary | ICD-10-CM | POA: Diagnosis present

## 2017-06-16 DIAGNOSIS — N390 Urinary tract infection, site not specified: Secondary | ICD-10-CM | POA: Diagnosis present

## 2017-06-16 DIAGNOSIS — R29714 NIHSS score 14: Secondary | ICD-10-CM | POA: Diagnosis present

## 2017-06-16 DIAGNOSIS — Z7982 Long term (current) use of aspirin: Secondary | ICD-10-CM

## 2017-06-16 DIAGNOSIS — F419 Anxiety disorder, unspecified: Secondary | ICD-10-CM | POA: Diagnosis present

## 2017-06-16 DIAGNOSIS — R4182 Altered mental status, unspecified: Secondary | ICD-10-CM

## 2017-06-16 DIAGNOSIS — F331 Major depressive disorder, recurrent, moderate: Secondary | ICD-10-CM | POA: Diagnosis not present

## 2017-06-16 DIAGNOSIS — R7303 Prediabetes: Secondary | ICD-10-CM

## 2017-06-16 DIAGNOSIS — E876 Hypokalemia: Secondary | ICD-10-CM | POA: Diagnosis present

## 2017-06-16 DIAGNOSIS — Z79899 Other long term (current) drug therapy: Secondary | ICD-10-CM

## 2017-06-16 DIAGNOSIS — I63512 Cerebral infarction due to unspecified occlusion or stenosis of left middle cerebral artery: Principal | ICD-10-CM | POA: Diagnosis present

## 2017-06-16 DIAGNOSIS — Z6827 Body mass index (BMI) 27.0-27.9, adult: Secondary | ICD-10-CM

## 2017-06-16 DIAGNOSIS — E785 Hyperlipidemia, unspecified: Secondary | ICD-10-CM | POA: Diagnosis present

## 2017-06-16 DIAGNOSIS — I69391 Dysphagia following cerebral infarction: Secondary | ICD-10-CM | POA: Diagnosis not present

## 2017-06-16 DIAGNOSIS — I69320 Aphasia following cerebral infarction: Secondary | ICD-10-CM

## 2017-06-16 DIAGNOSIS — I10 Essential (primary) hypertension: Secondary | ICD-10-CM | POA: Diagnosis present

## 2017-06-16 DIAGNOSIS — Z882 Allergy status to sulfonamides status: Secondary | ICD-10-CM

## 2017-06-16 DIAGNOSIS — W19XXXA Unspecified fall, initial encounter: Secondary | ICD-10-CM | POA: Diagnosis present

## 2017-06-16 DIAGNOSIS — K5903 Drug induced constipation: Secondary | ICD-10-CM | POA: Diagnosis present

## 2017-06-16 DIAGNOSIS — E871 Hypo-osmolality and hyponatremia: Secondary | ICD-10-CM | POA: Diagnosis present

## 2017-06-16 DIAGNOSIS — G9341 Metabolic encephalopathy: Secondary | ICD-10-CM | POA: Diagnosis present

## 2017-06-16 DIAGNOSIS — G8191 Hemiplegia, unspecified affecting right dominant side: Secondary | ICD-10-CM | POA: Diagnosis not present

## 2017-06-16 DIAGNOSIS — M25511 Pain in right shoulder: Secondary | ICD-10-CM | POA: Diagnosis present

## 2017-06-16 DIAGNOSIS — Y92129 Unspecified place in nursing home as the place of occurrence of the external cause: Secondary | ICD-10-CM

## 2017-06-16 DIAGNOSIS — I639 Cerebral infarction, unspecified: Secondary | ICD-10-CM

## 2017-06-16 DIAGNOSIS — Z7984 Long term (current) use of oral hypoglycemic drugs: Secondary | ICD-10-CM

## 2017-06-16 DIAGNOSIS — F32A Depression, unspecified: Secondary | ICD-10-CM | POA: Diagnosis present

## 2017-06-16 DIAGNOSIS — K219 Gastro-esophageal reflux disease without esophagitis: Secondary | ICD-10-CM | POA: Diagnosis present

## 2017-06-16 DIAGNOSIS — Z7902 Long term (current) use of antithrombotics/antiplatelets: Secondary | ICD-10-CM

## 2017-06-16 DIAGNOSIS — R5383 Other fatigue: Secondary | ICD-10-CM | POA: Diagnosis present

## 2017-06-16 DIAGNOSIS — G9340 Encephalopathy, unspecified: Secondary | ICD-10-CM | POA: Diagnosis not present

## 2017-06-16 DIAGNOSIS — R4701 Aphasia: Secondary | ICD-10-CM

## 2017-06-16 LAB — URINALYSIS, ROUTINE W REFLEX MICROSCOPIC
Bacteria, UA: NONE SEEN
Bilirubin Urine: NEGATIVE
GLUCOSE, UA: NEGATIVE mg/dL
Hgb urine dipstick: NEGATIVE
KETONES UR: 5 mg/dL — AB
NITRITE: NEGATIVE
PROTEIN: NEGATIVE mg/dL
Specific Gravity, Urine: >1.046 — ABNORMAL HIGH (ref 1.005–1.030)
pH: 6 (ref 5.0–8.0)

## 2017-06-16 LAB — COMPREHENSIVE METABOLIC PANEL
ALBUMIN: 3.6 g/dL (ref 3.5–5.0)
ALT: 26 U/L (ref 14–54)
ANION GAP: 12 (ref 5–15)
AST: 29 U/L (ref 15–41)
Alkaline Phosphatase: 82 U/L (ref 38–126)
BUN: 15 mg/dL (ref 6–20)
CO2: 23 mmol/L (ref 22–32)
Calcium: 9.1 mg/dL (ref 8.9–10.3)
Chloride: 99 mmol/L — ABNORMAL LOW (ref 101–111)
Creatinine, Ser: 0.61 mg/dL (ref 0.44–1.00)
GFR calc Af Amer: >60 mL/min (ref >60)
GFR calc non Af Amer: >60 mL/min (ref >60)
GLUCOSE: 128 mg/dL — AB (ref 65–99)
POTASSIUM: 3.3 mmol/L — AB (ref 3.5–5.1)
SODIUM: 134 mmol/L — AB (ref 135–145)
TOTAL PROTEIN: 6.8 g/dL (ref 6.5–8.1)
Total Bilirubin: 1.2 mg/dL (ref 0.3–1.2)

## 2017-06-16 LAB — I-STAT CHEM 8, ED
BUN: 15 mg/dL (ref 6–20)
CALCIUM ION: 1.08 mmol/L — AB (ref 1.15–1.40)
Chloride: 95 mmol/L — ABNORMAL LOW (ref 101–111)
Creatinine, Ser: 0.4 mg/dL — ABNORMAL LOW (ref 0.44–1.00)
GLUCOSE: 119 mg/dL — AB (ref 65–99)
HCT: 45 % (ref 36.0–46.0)
HEMOGLOBIN: 15.3 g/dL — AB (ref 12.0–15.0)
Potassium: 2.9 mmol/L — ABNORMAL LOW (ref 3.5–5.1)
SODIUM: 132 mmol/L — AB (ref 135–145)
TCO2: 23 mmol/L (ref 22–32)

## 2017-06-16 LAB — CBC
HCT: 45.4 % (ref 36.0–46.0)
Hemoglobin: 16.3 g/dL — ABNORMAL HIGH (ref 12.0–15.0)
MCH: 32 pg (ref 26.0–34.0)
MCHC: 35.9 g/dL (ref 30.0–36.0)
MCV: 89.2 fL (ref 78.0–100.0)
PLATELETS: 347 10*3/uL (ref 150–400)
RBC: 5.09 MIL/uL (ref 3.87–5.11)
RDW: 13.9 % (ref 11.5–15.5)
WBC: 11.9 10*3/uL — AB (ref 4.0–10.5)

## 2017-06-16 LAB — DIFFERENTIAL
BASOS ABS: 0 10*3/uL (ref 0.0–0.1)
BASOS PCT: 0 %
Eosinophils Absolute: 0.1 10*3/uL (ref 0.0–0.7)
Eosinophils Relative: 1 %
LYMPHS PCT: 26 %
Lymphs Abs: 3.1 10*3/uL (ref 0.7–4.0)
Monocytes Absolute: 0.8 10*3/uL (ref 0.1–1.0)
Monocytes Relative: 7 %
NEUTROS ABS: 7.8 10*3/uL — AB (ref 1.7–7.7)
NEUTROS PCT: 66 %

## 2017-06-16 LAB — I-STAT TROPONIN, ED: TROPONIN I, POC: 0.02 ng/mL (ref 0.00–0.08)

## 2017-06-16 LAB — PROTIME-INR
INR: 1.12
PROTHROMBIN TIME: 14.4 s (ref 11.4–15.2)

## 2017-06-16 LAB — APTT: APTT: 29 s (ref 24–36)

## 2017-06-16 LAB — RAPID URINE DRUG SCREEN, HOSP PERFORMED
Amphetamines: NOT DETECTED
BARBITURATES: NOT DETECTED
Benzodiazepines: POSITIVE — AB
COCAINE: NOT DETECTED
OPIATES: NOT DETECTED
TETRAHYDROCANNABINOL: NOT DETECTED

## 2017-06-16 LAB — LACTIC ACID, PLASMA: Lactic Acid, Venous: 1.7 mmol/L (ref 0.5–1.9)

## 2017-06-16 LAB — CBG MONITORING, ED: GLUCOSE-CAPILLARY: 129 mg/dL — AB (ref 65–99)

## 2017-06-16 LAB — MRSA PCR SCREENING: MRSA by PCR: NEGATIVE

## 2017-06-16 LAB — MAGNESIUM: Magnesium: 2 mg/dL (ref 1.7–2.4)

## 2017-06-16 MED ORDER — ONDANSETRON HCL 4 MG/2ML IJ SOLN: 4.0000 mg | Freq: Four times a day (QID) | INTRAMUSCULAR | Status: DC | PRN

## 2017-06-16 MED ORDER — ASPIRIN 81 MG PO CHEW
81.0000 mg | CHEWABLE_TABLET | Freq: Every day | ORAL | Status: DC
  Administered 2017-06-17 – 2017-06-22 (×6): 81 mg via ORAL
  Filled 2017-06-16 (×6): qty 1

## 2017-06-16 MED ORDER — ONDANSETRON HCL 4 MG PO TABS: 4.0000 mg | ORAL_TABLET | Freq: Four times a day (QID) | ORAL | Status: DC | PRN

## 2017-06-16 MED ORDER — HYDROCODONE-ACETAMINOPHEN 5-325 MG PO TABS
1.0000 | ORAL_TABLET | ORAL | Status: DC | PRN
  Administered 2017-06-17 (×2): 1 via ORAL
  Filled 2017-06-16 (×2): qty 1

## 2017-06-16 MED ORDER — ATORVASTATIN CALCIUM 10 MG PO TABS: 10.0000 mg | ORAL_TABLET | Freq: Every day | ORAL | Status: DC

## 2017-06-16 MED ORDER — AMANTADINE HCL 100 MG PO CAPS: 200.0000 mg | ORAL_CAPSULE | Freq: Two times a day (BID) | ORAL | Status: DC

## 2017-06-16 MED ORDER — PANTOPRAZOLE SODIUM 40 MG PO TBEC
40.0000 mg | DELAYED_RELEASE_TABLET | Freq: Every day | ORAL | Status: DC
  Administered 2017-06-17 – 2017-06-22 (×6): 40 mg via ORAL
  Filled 2017-06-16 (×6): qty 1

## 2017-06-16 MED ORDER — CLOPIDOGREL BISULFATE 75 MG PO TABS
75.0000 mg | ORAL_TABLET | Freq: Every day | ORAL | Status: DC
  Administered 2017-06-16 – 2017-06-22 (×7): 75 mg via ORAL
  Filled 2017-06-16 (×7): qty 1

## 2017-06-16 MED ORDER — BISACODYL 10 MG RE SUPP
10.0000 mg | Freq: Every day | RECTAL | Status: DC | PRN
  Administered 2017-06-19: 10 mg via RECTAL
  Filled 2017-06-16: qty 1

## 2017-06-16 MED ORDER — ALPRAZOLAM 0.5 MG PO TABS: 0.5000 mg | ORAL_TABLET | Freq: Three times a day (TID) | ORAL | Status: DC | PRN

## 2017-06-16 MED ORDER — GADOBENATE DIMEGLUMINE 529 MG/ML IV SOLN
14.0000 mL | Freq: Once | INTRAVENOUS | Status: AC
  Administered 2017-06-16: 14 mL via INTRAVENOUS

## 2017-06-16 MED ORDER — HEPARIN SODIUM (PORCINE) 5000 UNIT/ML IJ SOLN: 5000.0000 [IU] | Freq: Three times a day (TID) | INTRAMUSCULAR | Status: DC

## 2017-06-16 MED ORDER — SENNOSIDES-DOCUSATE SODIUM 8.6-50 MG PO TABS: 1.0000 | ORAL_TABLET | Freq: Every evening | ORAL | Status: DC | PRN

## 2017-06-16 MED ORDER — SODIUM CHLORIDE 0.9 % IV SOLN
INTRAVENOUS | Status: DC
  Administered 2017-06-16 – 2017-06-20 (×8): via INTRAVENOUS

## 2017-06-16 MED ORDER — HEPARIN SODIUM (PORCINE) 5000 UNIT/ML IJ SOLN
5000.0000 [IU] | Freq: Three times a day (TID) | INTRAMUSCULAR | Status: DC
  Administered 2017-06-16 – 2017-06-22 (×18): 5000 [IU] via SUBCUTANEOUS
  Filled 2017-06-16 (×17): qty 1

## 2017-06-16 MED ORDER — ACETAMINOPHEN 650 MG RE SUPP: 650.0000 mg | Freq: Four times a day (QID) | RECTAL | Status: DC | PRN

## 2017-06-16 MED ORDER — POTASSIUM CHLORIDE CRYS ER 20 MEQ PO TBCR
40.0000 meq | EXTENDED_RELEASE_TABLET | Freq: Once | ORAL | Status: AC
  Administered 2017-06-16: 40 meq via ORAL
  Filled 2017-06-16: qty 2

## 2017-06-16 MED ORDER — IOPAMIDOL (ISOVUE-370) INJECTION 76%
INTRAVENOUS | Status: AC
  Administered 2017-06-16: 50 mL
  Filled 2017-06-16: qty 50

## 2017-06-16 MED ORDER — ACETAMINOPHEN 325 MG PO TABS
650.0000 mg | ORAL_TABLET | Freq: Four times a day (QID) | ORAL | Status: DC | PRN
  Administered 2017-06-17 – 2017-06-19 (×3): 650 mg via ORAL
  Filled 2017-06-16 (×3): qty 2

## 2017-06-16 NOTE — ED Notes (Signed)
Patient transported to MRI 

## 2017-06-16 NOTE — Consult Note (Signed)
NEURO HOSPITALIST CONSULT NOTE   Requestig physician: Dr. Gilford Raid  Reason for Consult: Acute onset of aphasia  History obtained from:  EMS and Chart    HPI:                                                                                                                                          JASSLYN FINKEL is an 67 y.o. female presenting to [facility] as a Code Stroke. LKN was 1000 when she was conversational. Family found her at noon nonverbal. EMS was called and she was transported to the [facility] ED emergently.   Had a UTI 3 weeks ago and a perineal yeast infection last week. She had a stroke on March 1.   Home medications include ASA, Plavix and low-dose atorvastatin.   Past Medical History:  Diagnosis Date  . History of kidney stones    x1  . Hypertension     Past Surgical History:  Procedure Laterality Date  . CHOLECYSTECTOMY     laparoscopic  . COLONOSCOPY WITH PROPOFOL N/A 01/12/2015   Procedure: COLONOSCOPY WITH PROPOFOL;  Surgeon: Garlan Fair, MD;  Location: [facility];  Service: Endoscopy;  Laterality: N/A;  . TUBAL LIGATION      Family History  Problem Relation Age of Onset  . Diabetes Mother   . Hypertension Mother               Social History:  reports that she has never smoked. She has never used smokeless tobacco. She reports that she drinks alcohol. She reports that she does not use drugs.  Allergies  Allergen Reactions  . Sulfa Antibiotics Diarrhea and Itching    HOME MEDICATIONS:                                                                                                                        ROS:  Unable to obtain due to AMS.   There were no vitals taken for this visit.   General Examination:                                                                                                        Physical Exam  HEENT-  [place]/AT  Lungs- Respirations unlabored   Extremities- Noncyanotic  Neurological Examination Mental Status: Eyes open. Decreased level of alertness with inattention to right side. Will follow some simple left sided motor commands. Speech output consists of mumbling and moaning only.  Cranial Nerves: II: No blink to threat on right. PERRL. III,IV, VI: Eyes at midline, initially not deviating towards or away from visual stimuli with blank stare. After CT with reexamination patient able to deviate eyes to left and right. No nystagmus.  V,VII: Right facial droop. Decreased reaction to right sided stimuli.  VIII: Responds to some simple motor commands IX,X: Nonverbal. Will not open mouth XI: Head rotated preferentially to left XII: Does not protrude tongue Motor: RUE: Increased tone. Drops immediately to bed when raised. No movement to command or to noxious.  RLE: Increased extensor tone. Drops to bed with slight resistance against gravity. Weak ankle dorsiflexion to noxious plantar stimulation.  LUE: Elevates antigravity without drift. Not following commands for formal strength testing LLE: Elevates antigravity without drift. Withdraws briskly to noxious.  Sensory: Decreased reaction to right sided stimuli.  Deep Tendon Reflexes: Brisk low amplitude reflexes to RUE and RLE. 2+ reflexes LUE and LLE.  Plantars: Right: upgoing   Left: downgoing Cerebellar/Gait: Unable to assess.     Lab Results: Basic Metabolic Panel: No results for input(s): NA, K, CL, CO2, GLUCOSE, BUN, CREATININE, CALCIUM, MG, PHOS in the last 168 hours.  CBC: No results for input(s): WBC, NEUTROABS, HGB, HCT, MCV, PLT in the last 168 hours.  Cardiac Enzymes: No results for input(s): CKTOTAL, CKMB, CKMBINDEX, TROPONINI in the last 168 hours.  Lipid Panel: No results for input(s): CHOL, TRIG, HDL, CHOLHDL, VLDL, LDLCALC in the last 168 hours.  Imaging:  CT head: 1. Question newly seen  low-density in the left side of the pons, which could represent a pontine infarction. 2. Late subacute infarctions in the left occipital lobe and in the left basal ganglia and deep white matter show expected evolutionary changes. 3. Extensive chronic small-vessel ischemic changes elsewhere throughout the hemispheric white matter. 4. ASPECTS is 10.  CTA head/neck: -No intracranial large or medium vessel occlusion identified. -Atherosclerotic tortuosity of the aorta. 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 -Minimal atherosclerotic change at the left carotid bifurcation but no stenosis.  Assessment: 67 year old female presenting with acute onset of global aphasia and right hemiplegia.  1. CT head shows two late subacute ischemic infarctions, one in the left basal ganglia and one in the left occipital lobe. There was a question of a new pontine infarction on the left. Unclear if her presentation is due to a new stroke or worsening of deficit from recent strokes in the setting of infection (  encephalopathy unmasking latent motor and speech deficits).  2. No LVO on CTA head/neck 3. Was not a tPA candidate due to high risk of hemorrhagic conversion of the late subacute infarctions, as well as recent stroke within the last 10 weeks.  Recommendations: 1. MRI brain 2. No echocardiogram in Epic and no record of full stroke work up in [employer]. Would obtain records from her prior hospitalization at OSH and make decision regarding further testing based upon the information.  3. Management of infection.   Electronically signed: Dr. Kerney Elbe 06/16/2017, 1:33 PM

## 2017-06-16 NOTE — ED Triage Notes (Signed)
Per EMS: pt from rehab recovering from a stroke on 3/1/20109, staff at [facility] found pt non verbal and altered from baseline.  Pt's LKW 10:00.  Pt had a fall yesterday. Staff stated pt did not hit her head.  Pt recently treated for a UTI with Cipro, contracted a yeast infection after UTI tx.  PTA vitals: 122/84, HR 80, NSR on monitor.

## 2017-06-16 NOTE — ED Notes (Signed)
Spoke with MRI, pt will be transported directly to [facility] following MRI.

## 2017-06-16 NOTE — Progress Notes (Signed)
Pt admitted to unit via stretcher from MRI after ED admission. Pt denies pain. Family at bedside.

## 2017-06-16 NOTE — H&P (Signed)
History and Physical    Ebony Williams GXQ:[id number] DOB: 1950-03-27 DOA: 06/16/2017   PCP: Sharilyn Sites, MD   Patient coming from:  Home    Chief Complaint: acute onset of aphasia, and decreased level of responsiveness  HPI: Ebony Williams is a 67 y.o. female with medical history significant for UTI 3 weeks ago, with perineal yeast infection 1 week ago, presenting today with altered mental status and FACA.  The patient was independent prior to sustaining a stroke in April 06, 2017, at the time suffering right sided weakness, left gaze and aphasia.  She was treated with TPA and discharged home on aspirin and Plavix.  The patient has been at the rehab center.Until 10 AM today she was seen her usual state of health, however her daughter came to visit around 27 and found her to be confused.  She was not responding to her questions.  The patient of note, had a fall yesterday, not hitting her head, but today did not ambulate, was very tearful at the emergency department, and was nonconversant..  Stroke was called at 1313, EMS arrived at 1320 4 PM, NIH SS on arrival was 14, more responsive from reported.  CT of the head was negative for acute findings.  Neurology consultation was obtained; is low suspicion for stroke.  MRI of the head is pending.  No recommendations for TPA. Family at bedside, they are the main historians, as the patient is unable to elaborate.  Family is not aware of the patient having any respiratory or cardiac complaints.  They report that the patient may be having some tenderness in the lower abdomen.  The family denies any swelling in the lower extremities, or any other changes.  When patient asked if she can swallow she reports yes.  No new vision changes, although she prefers left gaze.   ED Course:  BP 123/70   Pulse 91   Temp (S) (!) 97.3 F (36.3 C) (Rectal)   Resp (!) 28   Ht 5\' 3"  (1.6 m)   Wt 69.2 kg (152 lb 8.9 oz)   SpO2 97%   BMI 27.02 kg/m   WBC 11.9, hemoglobin  16.3, glucose 129 with ketones in urine Sodium 132, potassium 2.9 Troponin was negative   EKG Sinus rhythm Consider left atrial enlargement Minimal ST depression, lateral leads  Received K. Dur 40 mEq  Review of Systems:  As per HPI otherwise all other systems reviewed and are negative  Past Medical History:  Diagnosis Date  . History of kidney stones    x1  . Hypertension     Past Surgical History:  Procedure Laterality Date  . CHOLECYSTECTOMY     laparoscopic  . COLONOSCOPY WITH PROPOFOL N/A 01/12/2015   Procedure: COLONOSCOPY WITH PROPOFOL;  Surgeon: Garlan Fair, MD;  Location: [facility];  Service: Endoscopy;  Laterality: N/A;  . TUBAL LIGATION      Social History Social History   Socioeconomic History  . Marital status: Married    Spouse name: Not on file  . Number of children: Not on file  . Years of education: Not on file  . Highest education level: Not on file  Occupational History  . Not on file  Social Needs  . Financial resource strain: Not on file  . Food insecurity:    Worry: Not on file    Inability: Not on file  . Transportation needs:    Medical: Not on file    Non-medical: Not on file  Tobacco Use  . Smoking status: Never Smoker  . Smokeless tobacco: Never Used  Substance and Sexual Activity  . Alcohol use: Yes    Comment: very rare  . Drug use: No  . Sexual activity: Not on file  Lifestyle  . Physical activity:    Days per week: Not on file    Minutes per session: Not on file  . Stress: Not on file  Relationships  . Social connections:    Talks on phone: Not on file    Gets together: Not on file    Attends religious service: Not on file    Active member of club or organization: Not on file    Attends meetings of clubs or organizations: Not on file    Relationship status: Not on file  . Intimate partner violence:    Fear of current or ex partner: Not on file    Emotionally abused: Not on file    Physically abused: Not on file      Forced sexual activity: Not on file  Other Topics Concern  . Not on file  Social History Narrative  . Not on file     Allergies  Allergen Reactions  . Sulfa Antibiotics Diarrhea and Itching    Family History  Problem Relation Age of Onset  . Diabetes Mother   . Hypertension Mother       Prior to Admission medications   Medication Sig Start Date End Date Taking? Authorizing Provider  ALPRAZolam Duanne Moron) 0.5 MG tablet Take 1 tablet (0.5 mg total) by mouth 3 (three) times daily as needed for anxiety or sleep. 05/07/17   Angiulli, Lavon Paganini, PA-C  amantadine (SYMMETREL) 100 MG capsule Take 2 capsules (200 mg total) by mouth 2 (two) times daily. 05/08/17   Angiulli, Lavon Paganini, PA-C  amLODipine (NORVASC) 10 MG tablet Take 1 tablet (10 mg total) by mouth daily. 05/08/17   Angiulli, Lavon Paganini, PA-C  aspirin 81 MG chewable tablet Chew 1 tablet (81 mg total) by mouth daily. 05/08/17   Angiulli, Lavon Paganini, PA-C  atorvastatin (LIPITOR) 10 MG tablet Take 1 tablet (10 mg total) by mouth daily at 6 PM. 04/11/17   Donzetta Starch, NP  chlorthalidone (HYGROTON) 25 MG tablet Take 1 tablet (25 mg total) by mouth daily. 05/08/17   Angiulli, Lavon Paganini, PA-C  clopidogrel (PLAVIX) 75 MG tablet Take 1 tablet (75 mg total) by mouth daily. 05/08/17   Angiulli, Lavon Paganini, PA-C  irbesartan (AVAPRO) 300 MG tablet Take 1 tablet (300 mg total) by mouth daily. 05/08/17   Angiulli, Lavon Paganini, PA-C  metFORMIN (GLUCOPHAGE) 500 MG tablet Take 1 tablet (500 mg total) by mouth daily with breakfast. 05/08/17   Angiulli, Lavon Paganini, PA-C  nystatin (NYSTATIN) powder Apply topically 4 (four) times daily.    [provider]  pantoprazole (PROTONIX) 40 MG tablet Take 1 tablet (40 mg total) by mouth daily. 05/07/17   Angiulli, Lavon Paganini, PA-C  tiZANidine (ZANAFLEX) 2 MG tablet Take by mouth every 6 (six) hours as needed for muscle spasms.    [provider]  venlafaxine XR (EFFEXOR-XR) 75 MG 24 hr capsule Take 75 mg by mouth daily with  breakfast.    [provider]    Physical Exam:  Vitals:   06/16/17 1600 06/16/17 1615 06/16/17 1630 06/16/17 1645  BP: 137/63 139/61 135/63 123/70  Pulse: 92 99 88 91  Resp: (!) 25 (!) 26 (!) 24 (!) 28  Temp:  TempSrc:      SpO2: 96% 97% 96% 97%  Weight:      Height:       Constitutional: NAD, but chronically ill-appearing.  Decreased level of alertness and inattention Eyes: PERRL but, lids and conjunctivae normal.  Patient favors left gaze.   ENMT: Mucous membranes are moist, without exudate or lesions  Neck: normal, supple, no masses, no thyromegaly Respiratory: clear to auscultation bilaterally, no wheezing, no crackles. Normal respiratory effort  Cardiovascular: Regular rate and rhythm, 2 out of 6 murmur, rubs or gallops. No extremity edema. 2+ pedal pulses. No carotid bruits.  Abdomen: Soft, non tender, No hepatosplenomegaly. Bowel sounds positive.  Musculoskeletal: no clubbing / cyanosis. Moves all extremities Skin: no jaundice, No lesions.  Neurologic: Sensation wrist on the right, right-sided weakness, right facial droop, favors left gaze.  Gait unable to be assessed. Psychiatric: Awake, but unable to interact at this time.    Labs on Admission: I have personally reviewed following labs and imaging studies  CBC: Recent Labs  Lab 06/16/17 1330 06/16/17 1453  WBC 11.9*  --   NEUTROABS 7.8*  --   HGB 16.3* 15.3*  HCT 45.4 45.0  MCV 89.2  --   PLT 347  --     Basic Metabolic Panel: Recent Labs  Lab 06/16/17 1453 06/16/17 1459  NA 132* 134*  K 2.9* 3.3*  CL 95* 99*  CO2  --  23  GLUCOSE 119* 128*  BUN 15 15  CREATININE 0.40* 0.61  CALCIUM  --  9.1    GFR: Estimated Creatinine Clearance: 61.9 mL/min (by C-G formula based on SCr of 0.61 mg/dL).  Liver Function Tests: Recent Labs  Lab 06/16/17 1459  AST 29  ALT 26  ALKPHOS 82  BILITOT 1.2  PROT 6.8  ALBUMIN 3.6   No results for input(s): LIPASE, AMYLASE in the last 168 hours. No  results for input(s): AMMONIA in the last 168 hours.  Coagulation Profile: Recent Labs  Lab 06/16/17 1330  INR 1.12    Cardiac Enzymes: No results for input(s): CKTOTAL, CKMB, CKMBINDEX, TROPONINI in the last 168 hours.  BNP (last 3 results) No results for input(s): PROBNP in the last 8760 hours.  HbA1C: No results for input(s): HGBA1C in the last 72 hours.  CBG: Recent Labs  Lab 06/16/17 1330  GLUCAP 129*    Lipid Profile: No results for input(s): CHOL, HDL, LDLCALC, TRIG, CHOLHDL, LDLDIRECT in the last 72 hours.  Thyroid Function Tests: No results for input(s): TSH, T4TOTAL, FREET4, T3FREE, THYROIDAB in the last 72 hours.  Anemia Panel: No results for input(s): VITAMINB12, FOLATE, FERRITIN, TIBC, IRON, RETICCTPCT in the last 72 hours.  Urine analysis:    Component Value Date/Time   COLORURINE YELLOW 06/16/2017 [place] 06/16/2017 1508   LABSPEC >1.046 (H) 06/16/2017 1508   PHURINE 6.0 06/16/2017 1508   GLUCOSEU NEGATIVE 06/16/2017 1508   HGBUR NEGATIVE 06/16/2017 1508   BILIRUBINUR NEGATIVE 06/16/2017 1508   KETONESUR 5 (A) 06/16/2017 1508   PROTEINUR NEGATIVE 06/16/2017 1508   NITRITE NEGATIVE 06/16/2017 1508   LEUKOCYTESUR SMALL (A) 06/16/2017 1508    Sepsis Labs: @LABRCNTIP (procalcitonin:4,lacticidven:4) )No results found for this or any previous visit (from the past 240 hour(s)).   Radiological Exams on Admission: Ct Angio Head W Or Wo Contrast  Result Date: 06/16/2017 CLINICAL DATA:  Focal neurological deficit. Speech disturbance. Last seen normal 1000 hours. EXAM: CT ANGIOGRAPHY HEAD AND NECK TECHNIQUE: Multidetector CT imaging of the head and  neck was performed using the standard protocol during bolus administration of intravenous contrast. Multiplanar CT image reconstructions and MIPs were obtained to evaluate the vascular anatomy. Carotid stenosis measurements (when applicable) are obtained utilizing NASCET criteria, using the  distal internal carotid diameter as the denominator. CONTRAST:  71mL ISOVUE-370 IOPAMIDOL (ISOVUE-370) INJECTION 76% COMPARISON:  CT earlier same day. Multiple previous neuro imaging studies this year. FINDINGS: CTA NECK FINDINGS Aortic arch: Aortic atherosclerosis. 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 Right carotid system: Common carotid artery widely patent to the bifurcation. No carotid bifurcation atherosclerotic disease. Cervical ICA widely patent. Left carotid system: Common carotid artery widely patent to the bifurcation. Minimal atherosclerotic plaque at the bifurcation but no stenosis or irregularity. Cervical ICA widely patent. Vertebral arteries: Both vertebral arteries widely patent at their origins and through the cervical region to the foramen magnum. Skeleton: Minimal cervical spondylosis. 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 Other neck: No mass or lymphadenopathy. Upper chest: Negative Review of the MIP images confirms the above findings CTA HEAD FINDINGS Anterior circulation: Both internal carotid arteries are patent through the skull base and siphon regions. Peripheral atherosclerotic calcification in the carotid siphon regions without stenosis more than about 25%. Cervical internal carotid arteries widely patent. The anterior and middle cerebral vessels are patent without proximal stenosis, aneurysm or vascular malformation. No missing distal branch vessels are identified. Posterior circulation: Both vertebral arteries are widely patent to the basilar. No basilar stenosis. Fenestrated basilar artery incidentally noted. Posterior circulation branch vessels appear normal. Venous sinuses: Patent and normal. Anatomic variants: None significant. Delayed phase: Abnormal enhancement. Review of the MIP images confirms the above findings IMPRESSION: No intracranial large or  medium vessel occlusion identified. Atherosclerotic tortuosity of the aorta. 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 Minimal atherosclerotic change at the left carotid bifurcation but no stenosis. These results were called by telephone at the time of interpretation on 06/16/2017 at 2:00 pm to Dr. Kerney Elbe , who verbally acknowledged these results. Electronically Signed   By: Nelson Chimes M.D.   On: 06/16/2017 14:03   Ct Angio Neck W Or Wo Contrast  Result Date: 06/16/2017 CLINICAL DATA:  Focal neurological deficit. Speech disturbance. Last seen normal 1000 hours. EXAM: CT ANGIOGRAPHY HEAD AND NECK TECHNIQUE: Multidetector CT imaging of the head and neck was performed using the standard protocol during bolus administration of intravenous contrast. Multiplanar CT image reconstructions and MIPs were obtained to evaluate the vascular anatomy. Carotid stenosis measurements (when applicable) are obtained utilizing NASCET criteria, using the distal internal carotid diameter as the denominator. CONTRAST:  33mL ISOVUE-370 IOPAMIDOL (ISOVUE-370) INJECTION 76% COMPARISON:  CT earlier same day. Multiple previous neuro imaging studies this year. FINDINGS: CTA NECK FINDINGS Aortic arch: Aortic atherosclerosis. 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 Right carotid system: Common carotid artery widely patent to the bifurcation. No carotid bifurcation atherosclerotic disease. Cervical ICA widely patent. Left carotid system: Common carotid artery widely patent to the bifurcation. Minimal atherosclerotic plaque at the bifurcation but no stenosis or irregularity. Cervical ICA widely patent. Vertebral arteries: Both vertebral arteries widely patent at their origins and through the cervical region to the foramen magnum. Skeleton: Minimal cervical spondylosis. Upper thoracic curvature and  congenital failure of separation. Other neck: No mass or lymphadenopathy. Upper chest: Negative  Review of the MIP images confirms the above findings CTA HEAD FINDINGS Anterior circulation: Both internal carotid arteries are patent through the skull base and siphon regions. Peripheral atherosclerotic calcification in the carotid siphon regions without stenosis more than about 25%. Cervical internal carotid arteries widely patent. The anterior and middle cerebral vessels are patent without proximal stenosis, aneurysm or vascular malformation. No missing distal branch vessels are identified. Posterior circulation: Both vertebral arteries are widely patent to the basilar. No basilar stenosis. Fenestrated basilar artery incidentally noted. Posterior circulation branch vessels appear normal. Venous sinuses: Patent and normal. Anatomic variants: None significant. Delayed phase: Abnormal enhancement. Review of the MIP images confirms the above findings IMPRESSION: No intracranial large or medium vessel occlusion identified. Atherosclerotic tortuosity of the aorta. 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 Minimal atherosclerotic change at the left carotid bifurcation but no stenosis. These results were called by telephone at the time of interpretation on 06/16/2017 at 2:00 pm to Dr. Kerney Elbe , who verbally acknowledged these results. Electronically Signed   By: Nelson Chimes M.D.   On: 06/16/2017 14:03   Dg Chest Portable 1 View  Result Date: 06/16/2017 CLINICAL DATA:  Stroke, recent UTI in [place] infection, fell yesterday, mental status change, history kidney stones and hypertension EXAM: PORTABLE CHEST 1 VIEW COMPARISON:  Portable exam 1540 hours compared to 04/16/2017 FINDINGS: Normal heart size, mediastinal contours, and pulmonary vascularity. Minimal LEFT basilar atelectasis decreased from previous exam. Lungs otherwise clear. No acute infiltrate, pleural effusion or  pneumothorax. Bones appear demineralized. IMPRESSION: Mild LEFT basilar atelectasis. Electronically Signed   By: Lavonia Dana M.D.   On: 06/16/2017 15:51   Ct Head Code Stroke Wo Contrast  Result Date: 06/16/2017 CLINICAL DATA:  Code stroke. Speech disturbance. Altered mental status. Last seen normal 1000 hours. EXAM: CT HEAD WITHOUT CONTRAST TECHNIQUE: Contiguous axial images were obtained from the base of the skull through the vertex without intravenous contrast. COMPARISON:  04/16/2017 FINDINGS: Brain: Question newly seen infarction in the left side of the pons. No focal cerebellar finding. Late subacute phase infarctions in the left occipital lobe, left basal ganglia and deep white matter show expected evolutionary changes. Chronic small-vessel ischemic changes seen elsewhere. No sign of hemorrhage. No hydrocephalus or extra-axial collection. Vascular: There is atherosclerotic calcification of the major vessels at the base of the brain. Skull: Negative Sinuses/Orbits: Clear/normal Other: None ASPECTS ([place] Stroke Program Early CT Score) - Ganglionic level infarction (caudate, lentiform nuclei, internal capsule, insula, M1-M3 cortex): 7 - Supraganglionic infarction (M4-M6 cortex): 3 Total score (0-10 with 10 being normal): 10 IMPRESSION: 1. Question newly seen low-density in the left side of the pons, which could represent a pontine infarction. 2. Late subacute infarctions in the left occipital lobe and in the left basal ganglia and deep white matter show expected evolutionary changes. 3. Extensive chronic small-vessel ischemic changes elsewhere throughout the hemispheric white matter. 4. ASPECTS is 10. 5. These results were communicated to Dr. Cheral Marker at [place] 5/11/2019by text page via the [facility] messaging system. Electronically Signed   By: Nelson Chimes M.D.   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  Neurology evaluated the patient, in view of her recent stroke with permanent right hemiplegia CT angios head is negative for acute intracranial abnormalities.  MRI of the head is pending.  no seizures noted. Afebrile. WBC 11.9, VSS. UA reassuring.  LFT unremarkable  Admit to telemetry inpatient Frequent neuro check Urine Drug Screen Lactic acid Urine culture pending, blood cultures follow results PT/OT in am  Appreciate Neuro follow up, awaiting further recommendations  Continue plavix and ASA  After passing bedswallow may resume her home meds, SLP pending   Hypokalemia, may be due to diuretics. EKG SR, K was 2.9  Received  40 meq IVx1, may need second dose  Oral replenishment Check Mg Repeat CMET in am  Type II Diabetes Current blood sugar level is 129 Lab Results  Component Value Date   HGBA1C 6.4 (H) 04/07/2017  Hold home oral diabetic medications.  SSI   Hypertension BP 123/70   Pulse 91    Hold home anti-hypertensive medications in am (On hold due to stroke like symptoms, awaiting MRI results )    Hyperlipidemia Continue home statins   GERD, no acute symptoms Continue PPI   Anxiety and depression  Continue Xanax  And Effexor   DVT prophylaxis:  Hep sq  Code Status:    Full  Family Communication:  Discussed with patient Disposition Plan: Expect patient to be discharged to [place]  after condition improves Consults called:    Neuro, Dr. Cheral Marker  Admission status: IP tele   Sharene Butters, PA-C [facility]   Amion text  [phone]   06/16/2017, 5:09 PM

## 2017-06-16 NOTE — ED Provider Notes (Signed)
[facility] EMERGENCY DEPARTMENT Provider Note   CSN: [id number] Arrival date & time: 06/16/17  1324     History   Chief Complaint Chief Complaint  Patient presents with  . Code Stroke    HPI Ebony Williams is a 67 y.o. female.  Pt presents to the ED today with altered mental status.  The pt was independent prior to sustaining a stroke on 04/06/17.  She had right sided weakness, left gaze, and aphasia.  Pt was treated with tpa.  Pt d/c home on asa and plavix.  She has been at the rehab center since d/c.  Mental status had been back to baseline.  The pt did have an uti 3 weeks ago which was treated with cipro.  This caused a yeast infection.  Today, pt was normal at 1000.  Daughter came to visit around 49 and found her altered.  Pt did fall yesterday, but did not hit her head.  Pt unable to give any history.  Code stroke called by EMS.      Past Medical History:  Diagnosis Date  . History of kidney stones    x1  . Hypertension     Patient Active Problem List   Diagnosis Date Noted  . Spastic hemiplegia of right dominant side as late effect of cerebral infarction ([place])   . Hyponatremia   . Labile blood pressure   . Hypokalemia   . Elevated BUN   . Lethargy   . Transaminitis   . Essential hypertension   . Right hemiplegia ([place])   . Hypertensive crisis   . 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 Depression   . Dysphagia, post-stroke   . Diastolic dysfunction   . Benign essential HTN   . Prediabetes   . Leukocytosis   . 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 Stroke (cerebrum) ([place]) 04/06/2017  . 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 Pain in joint, shoulder region 10/18/2011    Past Surgical History:  Procedure Laterality Date  . CHOLECYSTECTOMY     laparoscopic  . COLONOSCOPY WITH PROPOFOL N/A 01/12/2015   Procedure: COLONOSCOPY WITH PROPOFOL;  Surgeon: Garlan Fair, MD;   Location: [facility];  Service: Endoscopy;  Laterality: N/A;  . TUBAL LIGATION       OB History   None      Home Medications    Prior to Admission medications   Medication Sig Start Date End Date Taking? Authorizing Provider  ALPRAZolam Duanne Moron) 0.5 MG tablet Take 1 tablet (0.5 mg total) by mouth 3 (three) times daily as needed for anxiety or sleep. 05/07/17   Angiulli, Lavon Paganini, PA-C  amantadine (SYMMETREL) 100 MG capsule Take 2 capsules (200 mg total) by mouth 2 (two) times daily. 05/08/17   Angiulli, Lavon Paganini, PA-C  amLODipine (NORVASC) 10 MG tablet Take 1 tablet (10 mg total) by mouth daily. 05/08/17   Angiulli, Lavon Paganini, PA-C  aspirin 81 MG chewable tablet Chew 1 tablet (81 mg total) by mouth daily. 05/08/17   Angiulli, Lavon Paganini, PA-C  atorvastatin (LIPITOR) 10 MG tablet Take 1 tablet (10 mg total) by mouth daily at 6 PM. 04/11/17   Donzetta Starch, NP  chlorthalidone (HYGROTON) 25 MG tablet Take 1 tablet (25 mg total) by mouth daily. 05/08/17   Angiulli, Lavon Paganini, PA-C  clopidogrel (PLAVIX) 75 MG tablet Take 1 tablet (75 mg total) by mouth daily. 05/08/17  Angiulli, Lavon Paganini, PA-C  irbesartan (AVAPRO) 300 MG tablet Take 1 tablet (300 mg total) by mouth daily. 05/08/17   Angiulli, Lavon Paganini, PA-C  metFORMIN (GLUCOPHAGE) 500 MG tablet Take 1 tablet (500 mg total) by mouth daily with breakfast. 05/08/17   Angiulli, Lavon Paganini, PA-C  nystatin (NYSTATIN) powder Apply topically 4 (four) times daily.    [provider]  pantoprazole (PROTONIX) 40 MG tablet Take 1 tablet (40 mg total) by mouth daily. 05/07/17   Angiulli, Lavon Paganini, PA-C  tiZANidine (ZANAFLEX) 2 MG tablet Take by mouth every 6 (six) hours as needed for muscle spasms.    [provider]  venlafaxine XR (EFFEXOR-XR) 75 MG 24 hr capsule Take 75 mg by mouth daily with breakfast.    [provider]    Family History Family History  Problem Relation Age of Onset  . Diabetes Mother   . Hypertension Mother     Social  History Social History   Tobacco Use  . Smoking status: Never Smoker  . Smokeless tobacco: Never Used  Substance Use Topics  . Alcohol use: Yes    Comment: very rare  . Drug use: No     Allergies   Sulfa antibiotics   Review of Systems Review of Systems  Unable to perform ROS: Patient nonverbal     Physical Exam Updated Vital Signs BP 130/60   Pulse 95   Temp (S) (!) 97.3 F (36.3 C) (Rectal)   Resp (!) 25   Ht '5\' 3"'$  (1.6 m)   Wt 69.2 kg (152 lb 8.9 oz)   SpO2 96%   BMI 27.02 kg/m   Physical Exam  Constitutional: She appears well-developed and well-nourished.  HENT:  Head: Normocephalic and atraumatic.  Right Ear: External ear normal.  Left Ear: External ear normal.  Nose: Nose normal.  Mouth/Throat: Oropharynx is clear and moist.  Eyes: Pupils are equal, round, and reactive to light. Conjunctivae and EOM are normal.  Neck: Normal range of motion. Neck supple.  Cardiovascular: Normal rate, regular rhythm, normal heart sounds and intact distal pulses.  Pulmonary/Chest: Effort normal and breath sounds normal.  Abdominal: Soft. Bowel sounds are normal.  Musculoskeletal: Normal range of motion.  Neurological:  Pt will open eyes, but otherwise, will not move anything.  She is nonverbal.  Skin: Skin is warm. Capillary refill takes less than 2 seconds.  Psychiatric: She has a normal mood and affect. Her behavior is normal. Judgment and thought content normal.  Nursing note and vitals reviewed.    ED Treatments / Results  Labs (all labs ordered are listed, but only abnormal results are displayed) Labs Reviewed  CBC - Abnormal; Notable for the following components:      Result Value   WBC 11.9 (*)    Hemoglobin 16.3 (*)    All other components within normal limits  DIFFERENTIAL - Abnormal; Notable for the following components:   Neutro Abs 7.8 (*)    All other components within normal limits  URINALYSIS, ROUTINE W REFLEX MICROSCOPIC - Abnormal; Notable for  the following components:   Specific Gravity, Urine >1.046 (*)    Ketones, ur 5 (*)    Leukocytes, UA SMALL (*)    All other components within normal limits  CBG MONITORING, ED - Abnormal; Notable for the following components:   Glucose-Capillary 129 (*)    All other components within normal limits  I-STAT CHEM 8, ED - Abnormal; Notable for the following components:   Sodium 132 (*)  Potassium 2.9 (*)    Chloride 95 (*)    Creatinine, Ser 0.40 (*)    Glucose, Bld 119 (*)    Calcium, Ion 1.08 (*)    Hemoglobin 15.3 (*)    All other components within normal limits  PROTIME-INR  APTT  COMPREHENSIVE METABOLIC PANEL  I-STAT TROPONIN, ED    EKG EKG Interpretation  Date/Time:  Saturday Jun 16 2017 13:54:49 EDT Ventricular Rate:  91 PR Interval:    QRS Duration: 87 QT Interval:  365 QTC Calculation: 450 R Axis:   -11 Text Interpretation:  Sinus rhythm Consider left atrial enlargement Minimal ST depression, lateral leads Confirmed by Isla Pence [id number]) on 06/16/2017 2:18:09 PM   Radiology Ct Angio Head W Or Wo Contrast  Result Date: 06/16/2017 CLINICAL DATA:  Focal neurological deficit. Speech disturbance. Last seen normal 1000 hours. EXAM: CT ANGIOGRAPHY HEAD AND NECK TECHNIQUE: Multidetector CT imaging of the head and neck was performed using the standard protocol during bolus administration of intravenous contrast. Multiplanar CT image reconstructions and MIPs were obtained to evaluate the vascular anatomy. Carotid stenosis measurements (when applicable) are obtained utilizing NASCET criteria, using the distal internal carotid diameter as the denominator. CONTRAST:  86m ISOVUE-370 IOPAMIDOL (ISOVUE-370) INJECTION 76% COMPARISON:  CT earlier same day. Multiple previous neuro imaging studies this year. FINDINGS: CTA NECK FINDINGS Aortic arch: Aortic atherosclerosis. 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 Typical  diverticulum at that origin. Right carotid system: Common carotid artery widely patent to the bifurcation. No carotid bifurcation atherosclerotic disease. Cervical ICA widely patent. Left carotid system: Common carotid artery widely patent to the bifurcation. Minimal atherosclerotic plaque at the bifurcation but no stenosis or irregularity. Cervical ICA widely patent. Vertebral arteries: Both vertebral arteries widely patent at their origins and through the cervical region to the foramen magnum. Skeleton: Minimal cervical spondylosis. 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 Other neck: No mass or lymphadenopathy. Upper chest: Negative Review of the MIP images confirms the above findings CTA HEAD FINDINGS Anterior circulation: Both internal carotid arteries are patent through the skull base and siphon regions. Peripheral atherosclerotic calcification in the carotid siphon regions without stenosis more than about 25%. Cervical internal carotid arteries widely patent. The anterior and middle cerebral vessels are patent without proximal stenosis, aneurysm or vascular malformation. No missing distal branch vessels are identified. Posterior circulation: Both vertebral arteries are widely patent to the basilar. No basilar stenosis. Fenestrated basilar artery incidentally noted. Posterior circulation branch vessels appear normal. Venous sinuses: Patent and normal. Anatomic variants: None significant. Delayed phase: Abnormal enhancement. Review of the MIP images confirms the above findings IMPRESSION: No intracranial large or medium vessel occlusion identified. Atherosclerotic tortuosity of the aorta. 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 Minimal atherosclerotic change at the left carotid bifurcation but no stenosis. These results were called by telephone at the time of interpretation on 06/16/2017 at 2:00 pm to Dr. EKerney Elbe, who verbally acknowledged  these results. Electronically Signed   By: MNelson ChimesM.D.   On: 06/16/2017 14:03   Ct Angio Neck W Or Wo Contrast  Result Date: 06/16/2017 CLINICAL DATA:  Focal neurological deficit. Speech disturbance. Last seen normal 1000 hours. EXAM: CT ANGIOGRAPHY HEAD AND NECK TECHNIQUE: Multidetector CT imaging of the head and neck was performed using the standard protocol during bolus administration of intravenous contrast. Multiplanar CT image reconstructions and MIPs were obtained to  evaluate the vascular anatomy. Carotid stenosis measurements (when applicable) are obtained utilizing NASCET criteria, using the distal internal carotid diameter as the denominator. CONTRAST:  14m ISOVUE-370 IOPAMIDOL (ISOVUE-370) INJECTION 76% COMPARISON:  CT earlier same day. Multiple previous neuro imaging studies this year. FINDINGS: CTA NECK FINDINGS Aortic arch: Aortic atherosclerosis. 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 Right carotid system: Common carotid artery widely patent to the bifurcation. No carotid bifurcation atherosclerotic disease. Cervical ICA widely patent. Left carotid system: Common carotid artery widely patent to the bifurcation. Minimal atherosclerotic plaque at the bifurcation but no stenosis or irregularity. Cervical ICA widely patent. Vertebral arteries: Both vertebral arteries widely patent at their origins and through the cervical region to the foramen magnum. Skeleton: Minimal cervical spondylosis. 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 Other neck: No mass or lymphadenopathy. Upper chest: Negative Review of the MIP images confirms the above findings CTA HEAD FINDINGS Anterior circulation: Both internal carotid arteries are patent through the skull base and siphon regions. Peripheral atherosclerotic calcification in the carotid siphon regions without stenosis more than about  25%. Cervical internal carotid arteries widely patent. The anterior and middle cerebral vessels are patent without proximal stenosis, aneurysm or vascular malformation. No missing distal branch vessels are identified. Posterior circulation: Both vertebral arteries are widely patent to the basilar. No basilar stenosis. Fenestrated basilar artery incidentally noted. Posterior circulation branch vessels appear normal. Venous sinuses: Patent and normal. Anatomic variants: None significant. Delayed phase: Abnormal enhancement. Review of the MIP images confirms the above findings IMPRESSION: No intracranial large or medium vessel occlusion identified. Atherosclerotic tortuosity of the aorta. 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 Minimal atherosclerotic change at the left carotid bifurcation but no stenosis. These results were called by telephone at the time of interpretation on 06/16/2017 at 2:00 pm to Dr. EKerney Elbe, who verbally acknowledged these results. Electronically Signed   By: MNelson ChimesM.D.   On: 06/16/2017 14:03   Dg Chest Portable 1 View  Result Date: 06/16/2017 CLINICAL DATA:  Stroke[place] yesterday, mental status change, history kidney stones and hypertension EXAM: PORTABLE CHEST 1 VIEW COMPARISON:  Portable exam 1540 hours compared to 04/16/2017 FINDINGS: Normal heart size, mediastinal contours, and pulmonary vascularity. Minimal LEFT basilar atelectasis decreased from previous exam. Lungs otherwise clear. No acute infiltrate, pleural effusion or pneumothorax. Bones appear demineralized. IMPRESSION: Mild LEFT basilar atelectasis. Electronically Signed   By: MLavonia DanaM.D.   On: 06/16/2017 15:51   Ct Head Code Stroke Wo Contrast  Result Date: 06/16/2017 CLINICAL DATA:  Code stroke. Speech disturbance. Altered mental status. Last seen normal 1000 hours. EXAM: CT HEAD WITHOUT CONTRAST TECHNIQUE: Contiguous axial images  were obtained from the base of the skull through the vertex without intravenous contrast. COMPARISON:  04/16/2017 FINDINGS: Brain: Question newly seen infarction in the left side of the pons. No focal cerebellar finding. Late subacute phase infarctions in the left occipital lobe, left basal ganglia and deep white matter show expected evolutionary changes. Chronic small-vessel ischemic changes seen elsewhere. No sign of hemorrhage. No hydrocephalus or extra-axial collection. Vascular: There is atherosclerotic calcification of the major vessels at the base of the brain. Skull: Negative Sinuses/Orbits: Clear/normal Other: None ASPECTS ([place]roke Program Early CT Score) - Ganglionic level infarction (caudate, lentiform nuclei, internal capsule, insula, M1-M3 cortex): 7 - Supraganglionic infarction (M4-M6 cortex):  3 Total score (0-10 with 10 being normal): 10 IMPRESSION: 1. Question newly seen low-density in the left side of the pons, which could represent a pontine infarction. 2. Late subacute infarctions in the left occipital lobe and in the left basal ganglia and deep white matter show expected evolutionary changes. 3. Extensive chronic small-vessel ischemic changes elsewhere throughout the hemispheric white matter. 4. ASPECTS is 10. 5. These results were communicated to Dr. Cheral Marker at [facility] 5/11/2019by text page via the [facility] messaging system. Electronically Signed   By: Nelson Chimes M.D.   On: 06/16/2017 13:46    Procedures Procedures (including critical care time)  Medications Ordered in ED Medications  potassium chloride SA (K-DUR,KLOR-CON) CR tablet 40 mEq (has no administration in time range)  iopamidol (ISOVUE-370) 76 % injection (50 mLs  Contrast Given 06/16/17 1345)     Initial Impression / Assessment and Plan / ED Course  I have reviewed the triage vital signs and the nursing notes.  Pertinent labs & imaging results that were available during my care of the patient were reviewed by me  and considered in my medical decision making (see chart for details).    Code stroke team met pt in [place].  Nothing acute on CT.  No recommendations for tpa. They recommend admission and MRI.  Pt d/w triad hospitalists for admission.    Final Clinical Impressions(s) / ED Diagnoses   Final diagnoses:  Altered mental status, unspecified altered mental status type  Hypokalemia    ED Discharge Orders    None       Isla Pence, MD 06/16/17 1555

## 2017-06-16 NOTE — Code Documentation (Signed)
Patient at [facility] for rehab after CVA 04/06/17.  Recent UTI and yeast infection.  Fall yesterday, per staff no head injury.  LKW 1030, then about noon patient was noted to be non verbal and altered mental status.  Code Stroke called at 1313. Patient arrived via EMS at 1324  Stat head CT and labs done.  Patient becoming more interactive.  NIHSS 14,  Right hemi, Following command able to identify some objects, questionable visual field cut.   CTA done.  Dr Cheral Marker at bedside to assess patient, spoke with daughter.  Plan neuro check q 2 hours  X 12 hours

## 2017-06-17 ENCOUNTER — Other Ambulatory Visit (HOSPITAL_COMMUNITY): Payer: Medicare Other

## 2017-06-17 DIAGNOSIS — R4701 Aphasia: Secondary | ICD-10-CM

## 2017-06-17 LAB — GLUCOSE, CAPILLARY
Glucose-Capillary: 141 mg/dL — ABNORMAL HIGH (ref 65–99)
Glucose-Capillary: 110 mg/dL — ABNORMAL HIGH (ref 65–99)
Glucose-Capillary: 119 mg/dL — ABNORMAL HIGH (ref 65–99)

## 2017-06-17 LAB — CBC
HCT: 43.9 % (ref 36.0–46.0)
HEMOGLOBIN: 15.1 g/dL — AB (ref 12.0–15.0)
MCH: 31.3 pg (ref 26.0–34.0)
MCHC: 34.4 g/dL (ref 30.0–36.0)
MCV: 91.1 fL (ref 78.0–100.0)
Platelets: 289 10*3/uL (ref 150–400)
RBC: 4.82 MIL/uL (ref 3.87–5.11)
RDW: 13.6 % (ref 11.5–15.5)
WBC: 10.7 10*3/uL — ABNORMAL HIGH (ref 4.0–10.5)

## 2017-06-17 LAB — BASIC METABOLIC PANEL
ANION GAP: 13 (ref 5–15)
BUN: 13 mg/dL (ref 6–20)
CO2: 23 mmol/L (ref 22–32)
Calcium: 9.3 mg/dL (ref 8.9–10.3)
Chloride: 101 mmol/L (ref 101–111)
Creatinine, Ser: 0.58 mg/dL (ref 0.44–1.00)
GFR calc non Af Amer: >60 mL/min (ref >60)
Glucose, Bld: 121 mg/dL — ABNORMAL HIGH (ref 65–99)
Potassium: 3.7 mmol/L (ref 3.5–5.1)
Sodium: 137 mmol/L (ref 135–145)

## 2017-06-17 MED ORDER — ATORVASTATIN CALCIUM 40 MG PO TABS
40.0000 mg | ORAL_TABLET | Freq: Every day | ORAL | Status: DC
  Administered 2017-06-17 – 2017-06-21 (×5): 40 mg via ORAL
  Filled 2017-06-17 (×5): qty 1

## 2017-06-17 MED ORDER — INSULIN ASPART 100 UNIT/ML ~~LOC~~ SOLN: 0.0000 [IU] | Freq: Every day | SUBCUTANEOUS | Status: DC

## 2017-06-17 MED ORDER — SODIUM CHLORIDE 0.9 % IV SOLN
1.0000 g | INTRAVENOUS | Status: DC
  Administered 2017-06-17 – 2017-06-19 (×3): 1 g via INTRAVENOUS
  Filled 2017-06-17 (×4): qty 10

## 2017-06-17 MED ORDER — INSULIN ASPART 100 UNIT/ML ~~LOC~~ SOLN
0.0000 [IU] | Freq: Three times a day (TID) | SUBCUTANEOUS | Status: DC
  Administered 2017-06-18 (×2): 1 [IU] via SUBCUTANEOUS
  Administered 2017-06-18: 2 [IU] via SUBCUTANEOUS
  Administered 2017-06-19 – 2017-06-20 (×3): 1 [IU] via SUBCUTANEOUS
  Administered 2017-06-20: 5 [IU] via SUBCUTANEOUS
  Administered 2017-06-21 – 2017-06-22 (×2): 1 [IU] via SUBCUTANEOUS

## 2017-06-17 MED ORDER — AMANTADINE HCL 100 MG PO CAPS: 200.0000 mg | ORAL_CAPSULE | Freq: Two times a day (BID) | ORAL | Status: DC

## 2017-06-17 MED ORDER — FLUOXETINE HCL 20 MG PO CAPS
40.0000 mg | ORAL_CAPSULE | Freq: Every day | ORAL | Status: DC
  Administered 2017-06-17 – 2017-06-20 (×4): 40 mg via ORAL
  Filled 2017-06-17 (×5): qty 2

## 2017-06-17 NOTE — Evaluation (Signed)
Physical Therapy Evaluation Patient Details Name: Ebony Williams MRN: [id number] DOB: 10-Aug-1950 Today's Date: 06/17/2017   History of Present Illness  Pt is a 67 y.o. female with recent UTI admitted 06/16/17 with AMS. Of note, admitted 04/06/17 with L MCA and PCA parenchyma infarcts with discharge to CIR then SNF. MRI 5/11 shows no new intracrnial abnormality; continued diffusion abnormality in L basal ganglia and L occipital lobe. PMH includes HTN.    Clinical Impression  Pt presents with an overall decrease in functional mobility secondary to above. PTA, pt receiving rehab at [facility] in March; requiring assist for BADLs and functional mobility. Today, pt lethargic (suspect due to pain meds) requiring max cues to open eyes and participate; maxA+2 for bed mobility. Able to sit EOB ~7 minutes with intermittent min guard for balance; easily fatigued with this. Daughter present throughout session; reports plan is for pt to continue with SNF-level therapies. Pt would benefit from continued acute PT services to maximize functional mobility and independence prior to d/c to SNF.     Follow Up Recommendations SNF;Supervision/Assistance - 24 hour    Equipment Recommendations  (TBD)    Recommendations for Other Services       Precautions / Restrictions Precautions Precautions: Fall Restrictions Weight Bearing Restrictions: No      Mobility  Bed Mobility Overal bed mobility: Needs Assistance Bed Mobility: Rolling;Sidelying to Sit;Sit to Sidelying Rolling: Mod assist;+2 for safety/equipment;Min assist Sidelying to sit: Max assist;+2 for physical assistance     Sit to sidelying: Max assist;+2 for physical assistance General bed mobility comments: Pt able to roll left with Min A to initate movement and then rolling right with Mod A. Pt using bed rails and LUE to participate in rolling. Pt requiring Max A +2 for sidelying to sitting to bring BLEs to EOB and then elevate trunk.    Transfers                 General transfer comment: Not attempted due to pt lethargy  Ambulation/Gait                Stairs            Wheelchair Mobility    Modified Rankin (Stroke Patients Only)       Balance Overall balance assessment: Needs assistance Sitting-balance support: No upper extremity supported Sitting balance-Leahy Scale: Poor Sitting balance - Comments: Pt requiring Max A to gain sitting balance and then maintained sitting at EOB with Min Guard A for ~5-65minutes. Once pt fatigued, she required Max A to for sitting balance                                     Pertinent Vitals/Pain Pain Assessment: Faces Faces Pain Scale: Hurts even more Pain Location: movement of RUE Pain Descriptors / Indicators: Discomfort;Grimacing;Moaning Pain Intervention(s): Limited activity within patient's tolerance;Monitored during session;Repositioned;Premedicated before session    Home Living Family/patient expects to be discharged to:: Skilled nursing facility                 Additional Comments: Daughter reporting that pt was at [facility] for rehab and plans on returning to SNF for further rehab. She is unsure if she wants to return to [facility]    Prior Function Level of Independence: Needs assistance   Gait / Transfers Assistance Needed: Requiring assistance for funcitonal mobility with use of RW and  wheelchair  ADL's / Homemaking Assistance Needed: Assistance for BADLs        Hand Dominance   Dominant Hand: Right    Extremity/Trunk Assessment   Upper Extremity Assessment Upper Extremity Assessment: RUE deficits/detail RUE Deficits / Details: Limited ROM at shoulder, elbow, wrist, and hand. Hand in splint to prevent contracture. Pain with movements. Noted edema. Pt with rigidity in movement of RUE presenting at brunnstromm stage three with a tendency towards extension synergy pattern RUE: Unable to fully assess due to  pain RUE Coordination: decreased fine motor;decreased gross motor    Lower Extremity Assessment Lower Extremity Assessment: Difficult to assess due to impaired cognition(Pt functionally moving BLEs in bed at least 3/5 hip/knee)       Communication   Communication: No difficulties  Cognition Arousal/Alertness: Lethargic;Suspect due to medications Behavior During Therapy: Flat affect Overall Cognitive Status: Difficult to assess                                 General Comments: Pt requiring cues to open her eyes and follow simple cues      General Comments General comments (skin integrity, edema, etc.): Daughter present throughout session    Exercises     Assessment/Plan    PT Assessment Patient needs continued PT services  PT Problem List Decreased strength;Decreased activity tolerance;Decreased balance;Decreased mobility;Decreased cognition;Decreased knowledge of use of DME       PT Treatment Interventions DME instruction;Gait training;Stair training;Functional mobility training;Therapeutic activities;Therapeutic exercise;Balance training;Patient/family education    PT Goals (Current goals can be found in the Care Plan section)  Acute Rehab PT Goals Patient Stated Goal: Return to rehab PT Goal Formulation: With patient/family Time For Goal Achievement: 07/01/17 Potential to Achieve Goals: Good    Frequency Min 3X/week   Barriers to discharge        Co-evaluation PT/OT/SLP Co-Evaluation/Treatment: Yes Reason for Co-Treatment: Necessary to address cognition/behavior during functional activity;For patient/therapist safety;To address functional/ADL transfers PT goals addressed during session: Mobility/safety with mobility;Balance         AM-PAC PT "6 Clicks" Daily Activity  Outcome Measure Difficulty turning over in bed (including adjusting bedclothes, sheets and blankets)?: Unable Difficulty moving from lying on back to sitting on the side of the  bed? : Unable Difficulty sitting down on and standing up from a chair with arms (e.g., wheelchair, bedside commode, etc,.)?: Unable Help needed moving to and from a bed to chair (including a wheelchair)?: A Lot Help needed walking in hospital room?: A Lot Help needed climbing 3-5 steps with a railing? : Total 6 Click Score: 8    End of Session Equipment Utilized During Treatment: Gait belt Activity Tolerance: Patient tolerated treatment well Patient left: in bed;with call bell/phone within reach;with bed alarm set;with family/visitor present Nurse Communication: Mobility status PT Visit Diagnosis: Other abnormalities of gait and mobility (R26.89);Other symptoms and signs involving the nervous system (R29.898)    Time: 7793-9030 PT Time Calculation (min) (ACUTE ONLY): 29 min   Charges:   PT Evaluation $PT Eval Moderate Complexity: 1 Mod     PT G Codes:       Mabeline Caras, PT, DPT Acute Rehab Services  Pager: [place] 06/17/2017, 1:12 PM

## 2017-06-17 NOTE — Progress Notes (Addendum)
STROKE TEAM PROGRESS NOTE   HISTORY OF PRESENT ILLNESS (per record) Ebony Williams is an 67 y.o. female presenting to [facility] as a Code Stroke. LKN was 1000 when she was conversational. Family found her at noon nonverbal. EMS was called and she was transported to the [facility] ED emergently.   Had a UTI 3 weeks ago and a perineal yeast infection last week. She had a stroke on March 1.   Home medications include ASA, Plavix and low-dose atorvastatin.   Was not a tPA candidate due to high risk of hemorrhagic conversion of the late subacute infarctions, as well as recent stroke within the last 10 weeks     SUBJECTIVE (INTERVAL HISTORY) Her daughter and grandson at bedside, pt is getting emotional and crying.    OBJECTIVE Temp:  [97.3 F (36.3 C)-99.2 F (37.3 C)] 99.2 F (37.3 C) (05/12 0419) Pulse Rate:  [84-99] 97 (05/12 0419) Cardiac Rhythm: Normal sinus rhythm (05/12 0700) Resp:  [15-29] 18 (05/12 0419) BP: (120-156)/(59-73) 156/70 (05/12 0419) SpO2:  [95 %-98 %] 95 % (05/12 0419) Weight:  [152 lb 8.9 oz (69.2 kg)] 152 lb 8.9 oz (69.2 kg) (05/11 1358)  CBC:  Recent Labs  Lab 06/16/17 1330 06/16/17 1453 06/17/17 0515  WBC 11.9*  --  10.7*  NEUTROABS 7.8*  --   --   HGB 16.3* 15.3* 15.1*  HCT 45.4 45.0 43.9  MCV 89.2  --  91.1  PLT 347  --  270    Basic Metabolic Panel:  Recent Labs  Lab 06/16/17 1459 06/16/17 1848 06/17/17 0515  NA 134*  --  137  K 3.3*  --  3.7  CL 99*  --  101  CO2 23  --  23  GLUCOSE 128*  --  121*  BUN 15  --  13  CREATININE 0.61  --  0.58  CALCIUM 9.1  --  9.3  MG  --  2.0  --     Lipid Panel:     Component Value Date/Time   CHOL 159 04/07/2017 0226   TRIG 98 04/07/2017 0226   HDL 32 (L) 04/07/2017 0226   CHOLHDL 5.0 04/07/2017 0226   VLDL 20 04/07/2017 0226   LDLCALC 107 (H) 04/07/2017 0226   HgbA1c:  Lab Results  Component Value Date   HGBA1C 6.4 (H) 04/07/2017   Urine Drug Screen:     Component Value Date/Time   LABOPIA  NONE DETECTED 06/16/2017 1645   COCAINSCRNUR NONE DETECTED 06/16/2017 1645   LABBENZ POSITIVE (A) 06/16/2017 1645   AMPHETMU NONE DETECTED 06/16/2017 1645   THCU NONE DETECTED 06/16/2017 1645   LABBARB NONE DETECTED 06/16/2017 1645    Alcohol Level     Component Value Date/Time   ETH <10 04/06/2017 1714    IMAGING  Ct Angio Head W Or Wo Contrast Ct Angio Neck W Or Wo Contrast 06/16/2017 IMPRESSION: No intracranial large or medium vessel occlusion identified. Atherosclerotic tortuosity of the aorta. 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 Minimal atherosclerotic change at the left carotid bifurcation but no stenosis.    Mr Jeri Cos And Wo Contrast 06/16/2017 IMPRESSION:  1. Evolving ischemia with petechial hemorrhage, laminar necrosis, and developing encephalomalacia in the left MCA and PCA parenchyma affected on 04/07/2017. Continued diffusion abnormality in the left basal ganglia and portions of the left occipital lobe. Post ischemic enhancement.  2. Underlying advanced chronic small vessel disease suspected. Pronounced chronic appearing signal changes in  the medial thalami and pons.  3. No new intracranial ischemia or new intracranial abnormality identified.    Ct Head Code Stroke Wo Contrast 06/16/2017 IMPRESSION:  1. Question newly seen low-density in the left side of the pons, which could represent a pontine infarction.  2. Late subacute infarctions in the left occipital lobe and in the left basal ganglia and deep white matter show expected evolutionary changes.  3. Extensive chronic small-vessel ischemic changes elsewhere throughout the hemispheric white matter. 4. ASPECTS is 10. 5.     Dg Chest Portable 1 View 06/16/2017 IMPRESSION:  Mild LEFT basilar atelectasis.    Transthoracic Echocardiogram  04/08/2017 Study Conclusions - Left ventricle: The cavity size was normal. Wall thickness was   increased in a pattern of mild LVH.  There was moderate focal   basal hypertrophy of the septum. Systolic function was vigorous.   The estimated ejection fraction was in the range of 65% to 70%.   Wall motion was normal; there were no regional wall motion   abnormalities. Doppler parameters are consistent with abnormal   left ventricular relaxation (grade 1 diastolic dysfunction). - Aortic valve: There was mild regurgitation. Impressions: - Vigorous LV systolic function; mild LVH with moderate proximal   septal thickening and narrow LVOT; mildly elevated LVOT gradient   (peak velocity 2 m/s); mild diastolic dysfunction; mild AI.   Bilateral Carotid Dopplers - pending     PHYSICAL EXAM Vitals:   06/16/17 2200 06/17/17 0000 06/17/17 0200 06/17/17 0419  BP: (!) 142/71 (!) 152/66 (!) 156/63 (!) 156/70  Pulse: 87 90 87 97  Resp: 18 18 18 18   Temp: 98 F (36.7 C) 98.2 F (36.8 C) 98 F (36.7 C) 99.2 F (37.3 C)  TempSrc: Oral Oral Oral Oral  SpO2: 96%  97% 95%  Weight:      Height:        General crying, emotional  Heart - Regular rate and rhythm - no murmer appreciated Lungs - Clear to auscultation anteriorly Abdomen - Soft - non tender Extremities - Distal pulses intact - no edema Skin - Warm and dry  Neurological Examination Mental Status: Eyes open. Decreased level of alertness with inattention to right side. Not following commands Crying.  Cranial Nerves: II: No blink to threat on right. PERRL. III,IV, VI: Eyes at midline, initially not deviating towards or away from visual stimuli with blank stare. After CT with reexamination patient able to deviate eyes to left and right. No nystagmus.  V,VII: Right facial droop. Decreased reaction to right sided stimuli.  VIII: Responds to some simple motor commands IX,X: Nonverbal. Will not open mouth XI: Head rotated preferentially to left XII: Does not protrude tongue Motor: RUE: Increased tone. Drops immediately to bed when raised. No movement to command or to  noxious.  RLE: Increased extensor tone. Drops to bed with slight resistance against gravity. Weak ankle dorsiflexion to noxious plantar stimulation.  LUE: Elevates antigravity without drift. Not following commands for formal strength testing LLE: Elevates antigravity without drift. Withdraws briskly to noxious.  Sensory: Decreased reaction to right sided stimuli.  Cerebellar/Gait: Unable to assess.       ASSESSMENT/PLAN Ms. Ebony Williams is a 67 y.o. female with history of hypertension, previous strokes by imaging, and renal calculi brought to the emergency department after she was found nonverbal. She did not receive IV t-PA due to recent infarcts  Stroke: Evolving infarcts - multiple areas - likely embolic - unknown source  Resultant  Depression, R  hemiparesis   CT head - Question newly seen low-density in the left side of the pons, which could represent a pontine infarction.  MRI head - Evolving ischemia with petechial hemorrhage, laminar necrosis, and developing encephalomalacia in the left MCA and PCA parenchyma affected on 04/07/2017. Continued diffusion abnormality in the left basal ganglia and portions of the left occipital lobe.  MRA head - not performed  CTA H&N - No intracranial large or medium vessel occlusion identified.  Carotid Doppler ordered inspite of normal CTA to check hemodynamics with low-normal BP- pending  2D Echo - 04/08/2017 - EF 65 - 70% no cardiac source of emboli identified.  LDL - 107  HgbA1c - 6.4  VTE prophylaxis - subcutaneous heparin Diet Order           Diet heart healthy/carb modified Room service appropriate? Yes; Fluid consistency: Thin  Diet effective now          aspirin 81 mg daily and clopidogrel 75 mg daily prior to admission, now on aspirin 81 mg daily and clopidogrel 75 mg daily  Patient counseled to be compliant with her antithrombotic medications  Ongoing aggressive stroke risk factor management  Therapy recommendations:   pending  Disposition:  Pending  Hypertension  Stable, soft SBP, will D/C all BP meds for now  Permissive hypertension (OK if < 220/120) but gradually normalize in 5-7 days  Long-term BP goal normotensive  Hyperlipidemia  Lipid lowering medication PTA:  none  LDL 107, goal < 70  Current lipid lowering medication start Lipitor 40 mg daily  Continue statin at discharge   Other Stroke Risk Factors  Advanced age  ETOH use, advised to drink no more than 1 alcoholic beverage per day.  Overweight, Body mass index is 27.02 kg/m., recommend weight loss, diet and exercise as appropriate   Hx stroke/TIA   Other Active Problems  Mild leukocytosis   Plan / Recommendations   Stroke workup: awaiting - echo, carotid U/S  Therapy Follow Up: pending  Disposition: pending  Antiplatelet / Anticoagulation: continue aspirin 81 mg daily and Plavix 75 mg daily  Statin: Lipitor 40 mg daily started  off BP meds and monitor BP  Depression: add prozac 40mg  QHS to help with depression and muscle weakness, off effexor   Poor po intake, nutrition consult and 48H calories intake   Continue ceftriaxone for UTI  Plan discussed in details with her daughter at bedside   MD Follow Up: pending  Other: pending  Further risk factor modification per primary care MD: Follow Up 2 weeks   Hospital day # 1  Arnaldo Natal, MD  To contact Stroke Continuity provider, please refer to [email or web address] After hours, contact General Neurology

## 2017-06-17 NOTE — Progress Notes (Signed)
PT Cancellation Note  Patient Details Name: Ebony Williams MRN: [id number] DOB: 1950/10/10   Cancelled Treatment:    Reason Eval/Treat Not Completed: Active bedrest order. Please discharge bedrest orders if pt appropriate to mobilize for PT/OT evaluations.  Mabeline Caras, PT, DPT Acute Rehab Services  Pager: [place] 06/17/2017, 10:20 AM

## 2017-06-17 NOTE — Evaluation (Addendum)
Occupational Therapy Evaluation Patient Details Name: Ebony Williams MRN: [id number] DOB: 07-Mar-1950 Today's Date: 06/17/2017    History of Present Illness 67 y.o. female with medical history significant for UTI 3 weeks ago, with perineal yeast infection 1 week ago, presenting 06/16/17 with altered mental status and FACA.  The patient was independent prior to sustaining a stroke in April 06, 2017, at the time suffering right sided weakness, left gaze and aphasia. MRI shows negative for acute findings.   Clinical Impression   PTA, pt was at [facility] place for rehab after CVA in March. Pt requiring assistance for BADLs and functional mobility with RW and w/c. Pt currently requiring Max A-Total A for ADLs and Max A +2 for bed mobility and presenting with increased lethargy suspected due to pain medication. Pt keeping her eyes closed for majority of session. Pt able to sit at EOB with Min Guard A for ~5-7 minutes before fatiguing. Pt would benefit from further acute OT to facilitate safe dc. Recommend dc to SNF for further OT to optimize safety, independence with ADLs, and return to PLOF.      Follow Up Recommendations  SNF    Equipment Recommendations  Other (comment)(Defer to next venue)    Recommendations for Other Services PT consult;Speech consult     Precautions / Restrictions Precautions Precautions: Fall Restrictions Weight Bearing Restrictions: No      Mobility Bed Mobility Overal bed mobility: Needs Assistance Bed Mobility: Rolling;Sidelying to Sit;Sit to Sidelying Rolling: Mod assist;+2 for safety/equipment;Min assist Sidelying to sit: Max assist;+2 for physical assistance     Sit to sidelying: Max assist;+2 for physical assistance General bed mobility comments: Pt able to roll left with Min A to initate movement and then rolling right with Mod A. Pt using bed rails and LUE to participate in rolling. Pt requiring Max A +2 for sidelying to sitting to bring BLEs to EOB and  then elevate trunk.   Transfers                 General transfer comment: Not attempted due to pt lethargy    Balance Overall balance assessment: Needs assistance Sitting-balance support: No upper extremity supported Sitting balance-Leahy Scale: Poor Sitting balance - Comments: Pt requiring Max A to gain sitting balance and then maintained sitting at EOB with Min Guard A for ~5-72minutes. Once pt fatigued, she required Max A to for sitting balance                                   ADL either performed or assessed with clinical judgement   ADL Overall ADL's : Needs assistance/impaired Eating/Feeding: Maximal assistance;Bed level   Grooming: Brushing hair;Wash/dry face;Sitting;Bed level;Maximal assistance Grooming Details (indicate cue type and reason): Pt washing her face at bed level with Max hand over hand to faciltate grasp and then supprotat elbow. Once pt performing movement, she was able to maintain hand at her face and compelte tasks without support. Pt requiring Max A to brush her hair while seated at EOB. Able to maintain sitting at EOB with Min guard A for ~5-7 minutes before becoming fatigued and leaning posteriorly.  Upper Body Bathing: Maximal assistance;Sitting;Bed level   Lower Body Bathing: Total assistance;Bed level   Upper Body Dressing : Maximal assistance;Sitting;Bed level   Lower Body Dressing: Total assistance;Bed level       Toileting- Clothing Manipulation and Hygiene: Total assistance;+2 for physical assistance;Bed level Toileting -  Clothing Manipulation Details (indicate cue type and reason): Total A to performtoilet hygiene at bed level after BM       General ADL Comments: Pt with decreased arousal during session suspected due to pain medication. Pt requiring Max A for ADLs and Max A +2 for bed mobility. Pt requiring assistance to initate grooming tasks.     Vision         Perception     Praxis      Pertinent Vitals/Pain  Pain Assessment: Faces Faces Pain Scale: Hurts even more Pain Location: movement of RUE Pain Descriptors / Indicators: Discomfort;Grimacing;Moaning Pain Intervention(s): Monitored during session;Limited activity within patient's tolerance;Repositioned;Premedicated before session     Hand Dominance Right   Extremity/Trunk Assessment Upper Extremity Assessment Upper Extremity Assessment: RUE deficits/detail RUE Deficits / Details: Limited ROM at shoulder, elbow, wrist, and hand. Hand in splint to prevent contracture. Pain with movements. Noted edema. Pt with rigidity in movement of RUE presenting at brunnstromm stage three with a tendency towards extension synergy pattern RUE: Unable to fully assess due to pain RUE Coordination: decreased fine motor;decreased gross motor   Lower Extremity Assessment Lower Extremity Assessment: Defer to PT evaluation       Communication Communication Communication: No difficulties   Cognition Arousal/Alertness: Lethargic;Suspect due to medications Behavior During Therapy: Flat affect Overall Cognitive Status: Difficult to assess                                 General Comments: Pt requiring cues to open her eyes and follow simple cues   General Comments  Daughter present throughout session    Exercises     Shoulder Instructions      Home Living Family/patient expects to be discharged to:: Skilled nursing facility                                 Additional Comments: Daughter reporting that pt was at [facility] for rehab and plans on returning to SNF for further rehab. She is unsure if she wants to return to [facility]      Prior Functioning/Environment Level of Independence: Needs assistance  Gait / Transfers Assistance Needed: Requiring assistance for funcitonal mobility with use of RW and w/c ADL's / Homemaking Assistance Needed: Assistance for BADLs            OT Problem List: Decreased  strength;Decreased range of motion;Decreased activity tolerance;Impaired balance (sitting and/or standing);Decreased coordination;Decreased cognition;Decreased safety awareness;Decreased knowledge of use of DME or AE;Decreased knowledge of precautions;Impaired UE functional use;Pain;Increased edema      OT Treatment/Interventions: Self-care/ADL training;Therapeutic exercise;Energy conservation;DME and/or AE instruction;Therapeutic activities;Patient/family education;Cognitive remediation/compensation;Balance training    OT Goals(Current goals can be found in the care plan section) Acute Rehab OT Goals Patient Stated Goal: Return to rehab OT Goal Formulation: With patient/family Time For Goal Achievement: 07/01/17 Potential to Achieve Goals: Good ADL Goals Pt Will Perform Grooming: with min assist;sitting Pt Will Perform Upper Body Dressing: with min assist;sitting Pt Will Perform Lower Body Dressing: with mod assist;sit to/from stand Pt Will Transfer to Toilet: with mod assist;stand pivot transfer;bedside commode Pt Will Perform Toileting - Clothing Manipulation and hygiene: with mod assist;sit to/from stand Additional ADL Goal #1: Pt will perform bed mobility with Min A in preparation for ADLs  OT Frequency: Min 2X/week   Barriers to D/C:  Co-evaluation PT/OT/SLP Co-Evaluation/Treatment: Yes Reason for Co-Treatment: Necessary to address cognition/behavior during functional activity;To address functional/ADL transfers  OT goals addressed during session: ADL's and self-care      AM-PAC PT "6 Clicks" Daily Activity     Outcome Measure Help from another person eating meals?: A Lot Help from another person taking care of personal grooming?: A Lot Help from another person toileting, which includes using toliet, bedpan, or urinal?: Total Help from another person bathing (including washing, rinsing, drying)?: Total Help from another person to put on and taking off regular  upper body clothing?: A Lot Help from another person to put on and taking off regular lower body clothing?: Total 6 Click Score: 9   End of Session Nurse Communication: Mobility status  Activity Tolerance: Patient limited by lethargy Patient left: in bed;with call bell/phone within reach;with bed alarm set;with family/visitor present;with nursing/sitter in room  OT Visit Diagnosis: Other abnormalities of gait and mobility (R26.89);Unsteadiness on feet (R26.81);Muscle weakness (generalized) (M62.81);Other symptoms and signs involving cognitive function;Pain;Hemiplegia and hemiparesis Hemiplegia - Right/Left: Right Hemiplegia - dominant/non-dominant: Dominant Hemiplegia - caused by: Cerebral infarction(CVA at last admission) Pain - Right/Left: Right Pain - part of body: Arm;Hand                Time: 1749-4496 OT Time Calculation (min): 29 min Charges:  OT General Charges $OT Visit: 1 Visit OT Evaluation $OT Eval Moderate Complexity: 1 Mod G-Codes:     [place], OTR/L Acute Rehab Pager: [phone] Office: [place] 06/17/2017, 12:52 PM

## 2017-06-17 NOTE — Progress Notes (Addendum)
PROGRESS NOTE    Ebony Williams  CBJ:[id number] DOB: December 17, 1950 DOA: 06/16/2017 PCP: Sharilyn Sites, MD    Brief Narrative: Ebony Williams is a 67 y.o. female with medical history significant for UTI 3 weeks ago, with perineal yeast infection 1 week ago, presenting today with altered mental status and FACA.  The patient was independent prior to sustaining a stroke in April 06, 2017, at the time suffering right sided weakness, left gaze and aphasia.  She was treated with TPA and discharged home on aspirin and Plavix.  The patient has been at the rehab center.Until 10 AM today she was seen her usual state of health, however her daughter came to visit around 21 and found her to be confused.  She was not responding to her questions.  The patient of note, had a fall yesterday, not hitting her head, but today did not ambulate, was very tearful at the emergency department, and was nonconversant..  Stroke was called at 1313, EMS arrived at 1320 4 PM, NIH SS on arrival was 14, more responsive from reported.  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  MRI;  Evolving ischemia with petechial hemorrhage, laminar necrosis, and developing encephalomalacia in the left MCA and PCA parenchyma affected on 04/07/2017. Continued diffusion abnormality in the left basal ganglia and portions of the left occipital lobe. Post ischemic enhancement On aspirin and Plavix  Doppler ordered.  ECHO; had echo last admission. Neurology recommending echo CT angio; No intracranial large or medium vessel occlusion identified. Appreciate neurology  evaluation.   Acute encephalopathy;  She was more lethargic 5-12 after  Vicodin. Opioids discontinue Stroke vs infection UTI.  Vitals stable. CBG check and it was 141.  Treating also for UTI.  She is less lethargic today, would follow few command. Fall back sleep.  Will check ABG, EEG.  Patient was previously on amantadine. Will defer to neurology restarting this med.   Hypokalemia;  IV KCL.   Hyponatremia; improved with IV fluids.   Type II DM;  On metformin.  SSI while in patient.   HTN; permissive HTN Holding Norvasc.   HLD;  Continue with statins.   Anxiety , depression;  Hold xanax. Started on prozac.    DVT prophylaxis: heparin  Code Status: full code.  Family Communication: Daughter who was at bedside.  Disposition Plan: back to SNF when stable.    Consultants:   Neurology    Procedures:   Doppler;   Antimicrobials:  Ceftriaxone 5-12  Subjective: She would open eyes for few minutes to voice. Appear weak.  She would raise left upper and lower extremities.   Objective: Vitals:   06/17/17 2001 06/18/17 0029 06/18/17 0557 06/18/17 0801  BP: (!) 150/66 140/64 (!) 153/76 (!) 141/73  Pulse: 90 90 84 92  Resp: 18 19 19 18   Temp: 98.6 F (37 C) 99.1 F (37.3 C) 98.3 F (36.8 C) 98.7 F (37.1 C)  TempSrc: Oral Oral Oral Oral  SpO2: 95% 97% 95% 97%  Weight:   71.3 kg (157 lb 3 oz)   Height:  Intake/Output Summary (Last 24 hours) at 06/18/2017 1200 Last data filed at 06/18/2017 0300 Gross per 24 hour  Intake 1834.58 ml  Output -  Net 1834.58 ml   Filed Weights   06/16/17 1358 06/18/17 0557  Weight: 69.2 kg (152 lb 8.9 oz) 71.3 kg (157 lb 3 oz)    Examination:  General exam: Lethargic  Respiratory system: normal respiratory effort.  Cardiovascular system: S 1, S 2 RRR Gastrointestinal system: BS present, soft, nt Central nervous system: lethargic, would open eyes to voice few minutes. Moves left side.  Extremities: Symmetric 5 x 5  power. Skin: No rashes, lesions or ulcers   Data Reviewed: I have personally reviewed following labs and imaging studies  CBC: Recent Labs  Lab 06/16/17 1330 06/16/17 1453 06/17/17 0515 06/18/17 0754  WBC 11.9*  --  10.7* 6.8  NEUTROABS 7.8*  --   --   --   HGB 16.3* 15.3* 15.1* 14.8  HCT 45.4 45.0 43.9 42.7  MCV 89.2  --  91.1 90.1  PLT 347  --  289 188   Basic Metabolic Panel: Recent Labs  Lab 06/16/17 1453 06/16/17 1459 06/16/17 1848 06/17/17 0515 06/18/17 0754  NA 132* 134*  --  137 139  K 2.9* 3.3*  --  3.7 3.1*  CL 95* 99*  --  101 104  CO2  --  23  --  23 25  GLUCOSE 119* 128*  --  121* 125*  BUN 15 15  --  13 6  CREATININE 0.40* 0.61  --  0.58 0.51  CALCIUM  --  9.1  --  9.3 9.0  MG  --   --  2.0  --   --    GFR: Estimated Creatinine Clearance: 62.9 mL/min (by C-G formula based on SCr of 0.51 mg/dL). Liver Function Tests: Recent Labs  Lab 06/16/17 1459  AST 29  ALT 26  ALKPHOS 82  BILITOT 1.2  PROT 6.8  ALBUMIN 3.6   No results for input(s): LIPASE, AMYLASE in the last 168 hours. No results for input(s): AMMONIA in the last 168 hours. Coagulation Profile: Recent Labs  Lab 06/16/17 1330  INR 1.12   Cardiac Enzymes: No results for input(s): CKTOTAL, CKMB, CKMBINDEX, TROPONINI in the last 168 hours. BNP (last 3 results) No results for input(s): PROBNP in the last 8760 hours. HbA1C: No results for input(s): HGBA1C in the last 72 hours. CBG: Recent Labs  Lab 06/17/17 1128 06/17/17 1647 06/17/17 2155 06/18/17 0553 06/18/17 1109  GLUCAP 141* 110* 119* 152* 145*   Lipid Profile: No results for input(s): CHOL, HDL, LDLCALC, TRIG, CHOLHDL, LDLDIRECT in the last 72 hours. Thyroid Function Tests: No results for input(s): TSH, T4TOTAL, FREET4, T3FREE, THYROIDAB in the last 72 hours. Anemia Panel: No results for input(s): VITAMINB12, FOLATE, FERRITIN, TIBC, IRON, RETICCTPCT in the last 72 hours. Sepsis Labs: Recent Labs  Lab 06/16/17 1848    LATICACIDVEN 1.7    Recent Results (from the past 240 hour(s))  Urine culture     Status: Abnormal (Preliminary result)   Collection Time: 06/16/17  4:45 PM  Result Value Ref Range Status   Specimen Description URINE, RANDOM  Final   Special Requests   Final    NONE Performed at [facility], [address], [place], [place] [address]    Culture >=100,000 COLONIES/mL GRAM NEGATIVE RODS (A)  Final   Report Status PENDING  Incomplete  MRSA PCR Screening     Status: None  Collection Time: 06/16/17  6:51 PM  Result Value Ref Range Status   MRSA by PCR NEGATIVE NEGATIVE Final    Comment:        The GeneXpert MRSA Assay (FDA approved for NASAL specimens only), is one component of a comprehensive MRSA colonization surveillance program. It is not intended to diagnose MRSA infection nor to guide or monitor treatment for MRSA infections. Performed at [facility], [place] [address], [place], [place] [address]          Radiology Studies: Ct Angio Head W Or Wo Contrast  Result Date: 06/16/2017 CLINICAL DATA:  Focal neurological deficit. Speech disturbance. Last seen normal 1000 hours. EXAM: CT ANGIOGRAPHY HEAD AND NECK TECHNIQUE: Multidetector CT imaging of the head and neck was performed using the standard protocol during bolus administration of intravenous contrast. Multiplanar CT image reconstructions and MIPs were obtained to evaluate the vascular anatomy. Carotid stenosis measurements (when applicable) are obtained utilizing NASCET criteria, using the distal internal carotid diameter as the denominator. CONTRAST:  76mL ISOVUE-370 IOPAMIDOL (ISOVUE-370) INJECTION 76% COMPARISON:  CT earlier same day. Multiple previous neuro imaging studies this year. FINDINGS: CTA NECK FINDINGS Aortic arch: Aortic atherosclerosis. 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 Right carotid  system: Common carotid artery widely patent to the bifurcation. No carotid bifurcation atherosclerotic disease. Cervical ICA widely patent. Left carotid system: Common carotid artery widely patent to the bifurcation. Minimal atherosclerotic plaque at the bifurcation but no stenosis or irregularity. Cervical ICA widely patent. Vertebral arteries: Both vertebral arteries widely patent at their origins and through the cervical region to the foramen magnum. Skeleton: Minimal cervical spondylosis. 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 Other neck: No mass or lymphadenopathy. Upper chest: Negative Review of the MIP images confirms the above findings CTA HEAD FINDINGS Anterior circulation: Both internal carotid arteries are patent through the skull base and siphon regions. Peripheral atherosclerotic calcification in the carotid siphon regions without stenosis more than about 25%. Cervical internal carotid arteries widely patent. The anterior and middle cerebral vessels are patent without proximal stenosis, aneurysm or vascular malformation. No missing distal branch vessels are identified. Posterior circulation: Both vertebral arteries are widely patent to the basilar. No basilar stenosis. Fenestrated basilar artery incidentally noted. Posterior circulation branch vessels appear normal. Venous sinuses: Patent and normal. Anatomic variants: None significant. Delayed phase: Abnormal enhancement. Review of the MIP images confirms the above findings IMPRESSION: No intracranial large or medium vessel occlusion identified. Atherosclerotic tortuosity of the aorta. 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 Minimal atherosclerotic change at the left carotid bifurcation but no stenosis. These results were called by telephone at the time of interpretation on 06/16/2017 at 2:00 pm to Dr. Kerney Elbe , who verbally acknowledged these results. Electronically Signed   By:  Nelson Chimes M.D.   On: 06/16/2017 14:03   Ct Angio Neck W Or Wo Contrast  Result Date: 06/16/2017 CLINICAL DATA:  Focal neurological deficit. Speech disturbance. Last seen normal 1000 hours. EXAM: CT ANGIOGRAPHY HEAD AND NECK TECHNIQUE: Multidetector CT imaging of the head and neck was performed using the standard protocol during bolus administration of intravenous contrast. Multiplanar CT image reconstructions and MIPs were obtained to evaluate the vascular anatomy. Carotid stenosis measurements (when applicable) are obtained utilizing NASCET criteria, using the distal internal carotid diameter as the denominator. CONTRAST:  1mL ISOVUE-370 IOPAMIDOL (ISOVUE-370) INJECTION  76% COMPARISON:  CT earlier same day. Multiple previous neuro imaging studies this year. FINDINGS: CTA NECK FINDINGS Aortic arch: Aortic atherosclerosis. 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 Right carotid system: Common carotid artery widely patent to the bifurcation. No carotid bifurcation atherosclerotic disease. Cervical ICA widely patent. Left carotid system: Common carotid artery widely patent to the bifurcation. Minimal atherosclerotic plaque at the bifurcation but no stenosis or irregularity. Cervical ICA widely patent. Vertebral arteries: Both vertebral arteries widely patent at their origins and through the cervical region to the foramen magnum. Skeleton: Minimal cervical spondylosis. 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 Other neck: No mass or lymphadenopathy. Upper chest: Negative Review of the MIP images confirms the above findings CTA HEAD FINDINGS Anterior circulation: Both internal carotid arteries are patent through the skull base and siphon regions. Peripheral atherosclerotic calcification in the carotid siphon regions without stenosis more than about 25%. Cervical internal carotid arteries  widely patent. The anterior and middle cerebral vessels are patent without proximal stenosis, aneurysm or vascular malformation. No missing distal branch vessels are identified. Posterior circulation: Both vertebral arteries are widely patent to the basilar. No basilar stenosis. Fenestrated basilar artery incidentally noted. Posterior circulation branch vessels appear normal. Venous sinuses: Patent and normal. Anatomic variants: None significant. Delayed phase: Abnormal enhancement. Review of the MIP images confirms the above findings IMPRESSION: No intracranial large or medium vessel occlusion identified. Atherosclerotic tortuosity of the aorta. 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 Minimal atherosclerotic change at the left carotid bifurcation but no stenosis. These results were called by telephone at the time of interpretation on 06/16/2017 at 2:00 pm to Dr. Kerney Elbe , who verbally acknowledged these results. Electronically Signed   By: Nelson Chimes M.D.   On: 06/16/2017 14:03   Mr Jeri Cos And Wo Contrast  Result Date: 06/16/2017 CLINICAL DATA:  67 year old female with abnormal speech. Left MCA and PCA territory infarcts in March of this year. EXAM: MRI HEAD WITHOUT AND WITH CONTRAST TECHNIQUE: Multiplanar, multiecho pulse sequences of the brain and surrounding structures were obtained without and with intravenous contrast. CONTRAST:  33mL MULTIHANCE GADOBENATE DIMEGLUMINE 529 MG/ML IV SOLN COMPARISON:  Head CT and CTA head and neck earlier today. Brain MRI 04/07/2017. FINDINGS: Brain: Continued confluent diffusion abnormality in the left basal ganglia since March with superimposed developing cystic encephalomalacia which is currently most pronounced in the left caudate. Intrinsic T1 hyperintensity has developed in this area compatible with petechial hemorrhage and/or laminar necrosis, although there is mild superimposed post ischemic enhancement. The remaining  scattered left MCA territory cortical diffusion abnormality has resolved since March, with cortical and cystic encephalomalacia in the superior left parietal lobe including portions of the sensory strip. There is minimal post ischemic enhancement. There also remains some residual confluent diffusion abnormality in the lateral left occipital lobe, although the majority of the parenchymal diffusion restriction has resolved. Similar intrinsic T1 cortical laminar necrosis is noted at that site with developing T2 and FLAIR hyperintense encephalomalacia, and petechial hemorrhage. Ex vacuo enlargement of the left occipital horn has begun. There is also patchy post ischemic enhancement. No new area of restricted diffusion today. No contralateral or posterior fossa diffusion abnormality. No midline shift, evidence of mass lesion, ventriculomegaly, extra-axial collection or malignant intracranial hemorrhage. Outside of the above findings confluent bilateral cerebral white matter T2 and FLAIR hyperintensity is stable. T2 heterogeneity  in the bilateral medial thalami has not definitely changed. Patchy T2 hyperintensity throughout much of the pons also is felt to be stable. No other abnormal intracranial enhancement.  No dural thickening. Cervicomedullary junction and pituitary are within normal limits. Vascular: Major intracranial vascular flow voids are preserved. The major dural venous sinuses are enhancing and appear patent. Skull and upper cervical spine: Negative visible cervical spine and spinal cord. Visualized bone marrow signal is within normal limits. Sinuses/Orbits: Normal orbits soft tissues. Bilateral paranasal sinus disease has largely resolved. Other: Mastoid air cells remain clear. Visible internal auditory structures appear normal. Scalp and face soft tissues appear negative. IMPRESSION: 1. Evolving ischemia with petechial hemorrhage, laminar necrosis, and developing encephalomalacia in the left MCA and PCA  parenchyma affected on 04/07/2017. Continued diffusion abnormality in the left basal ganglia and portions of the left occipital lobe. Post ischemic enhancement. 2. Underlying advanced chronic small vessel disease suspected. Pronounced chronic appearing signal changes in the medial thalami and pons. 3. No new intracranial ischemia or new intracranial abnormality identified. Electronically Signed   By: Genevie Ann M.D.   On: 06/16/2017 18:00   Dg Chest Portable 1 View  Result Date: 06/16/2017 CLINICAL DATA:  Stroke, recent UTI in [place] infection, fell yesterday, mental status change, history kidney stones and hypertension EXAM: PORTABLE CHEST 1 VIEW COMPARISON:  Portable exam 1540 hours compared to 04/16/2017 FINDINGS: Normal heart size, mediastinal contours, and pulmonary vascularity. Minimal LEFT basilar atelectasis decreased from previous exam. Lungs otherwise clear. No acute infiltrate, pleural effusion or pneumothorax. Bones appear demineralized. IMPRESSION: Mild LEFT basilar atelectasis. Electronically Signed   By: Lavonia Dana M.D.   On: 06/16/2017 15:51   Ct Head Code Stroke Wo Contrast  Result Date: 06/16/2017 CLINICAL DATA:  Code stroke. Speech disturbance. Altered mental status. Last seen normal 1000 hours. EXAM: CT HEAD WITHOUT CONTRAST TECHNIQUE: Contiguous axial images were obtained from the base of the skull through the vertex without intravenous contrast. COMPARISON:  04/16/2017 FINDINGS: Brain: Question newly seen infarction in the left side of the pons. No focal cerebellar finding. Late subacute phase infarctions in the left occipital lobe, left basal ganglia and deep white matter show expected evolutionary changes. Chronic small-vessel ischemic changes seen elsewhere. No sign of hemorrhage. No hydrocephalus or extra-axial collection. Vascular: There is atherosclerotic calcification of the major vessels at the base of the brain. Skull: Negative Sinuses/Orbits: Clear/normal Other: None ASPECTS  ([place] Stroke Program Early CT Score) - Ganglionic level infarction (caudate, lentiform nuclei, internal capsule, insula, M1-M3 cortex): 7 - Supraganglionic infarction (M4-M6 cortex): 3 Total score (0-10 with 10 being normal): 10 IMPRESSION: 1. Question newly seen low-density in the left side of the pons, which could represent a pontine infarction. 2. Late subacute infarctions in the left occipital lobe and in the left basal ganglia and deep white matter show expected evolutionary changes. 3. Extensive chronic small-vessel ischemic changes elsewhere throughout the hemispheric white matter. 4. ASPECTS is 10. 5. These results were communicated to Dr. Cheral Marker at [place] 5/11/2019by text page via the [facility] messaging system. Electronically Signed   By: Nelson Chimes M.D.   On: 06/16/2017 13:46        Scheduled Meds: . aspirin  81 mg Oral Daily  . atorvastatin  40 mg Oral q1800  . clopidogrel  75 mg Oral Daily  . FLUoxetine  40 mg Oral QHS  . heparin  5,000 Units Subcutaneous Q8H  . insulin aspart  0-5 Units Subcutaneous QHS  . insulin aspart  0-9 Units Subcutaneous TID WC  . pantoprazole  40 mg Oral Daily   Continuous Infusions: . sodium chloride 100 mL/hr at 06/18/17 0847  . cefTRIAXone (ROCEPHIN)  IV 1 g (06/18/17 1049)  . potassium chloride 10 mEq (06/18/17 1138)     LOS: 2 days    Time spent: 35 minutes.     Elmarie Shiley, MD [facility] Pager [phone]  If 7PM-7AM, please contact night-coverage [email or web address] Password [email or web address] 06/18/2017, 12:00 PM

## 2017-06-18 ENCOUNTER — Inpatient Hospital Stay (HOSPITAL_COMMUNITY): Payer: Medicare Other

## 2017-06-18 ENCOUNTER — Encounter (HOSPITAL_COMMUNITY): Payer: Medicare Other

## 2017-06-18 ENCOUNTER — Other Ambulatory Visit (HOSPITAL_COMMUNITY): Payer: Medicare Other

## 2017-06-18 DIAGNOSIS — G9340 Encephalopathy, unspecified: Secondary | ICD-10-CM

## 2017-06-18 LAB — CBC
HCT: 42.7 % (ref 36.0–46.0)
Hemoglobin: 14.8 g/dL (ref 12.0–15.0)
MCH: 31.2 pg (ref 26.0–34.0)
MCHC: 34.7 g/dL (ref 30.0–36.0)
MCV: 90.1 fL (ref 78.0–100.0)
Platelets: 253 10*3/uL (ref 150–400)
RBC: 4.74 MIL/uL (ref 3.87–5.11)
RDW: 13.2 % (ref 11.5–15.5)
WBC: 6.8 10*3/uL (ref 4.0–10.5)

## 2017-06-18 LAB — GLUCOSE, CAPILLARY
Glucose-Capillary: 152 mg/dL — ABNORMAL HIGH (ref 65–99)
Glucose-Capillary: 145 mg/dL — ABNORMAL HIGH (ref 65–99)
Glucose-Capillary: 131 mg/dL — ABNORMAL HIGH (ref 65–99)
Glucose-Capillary: 127 mg/dL — ABNORMAL HIGH (ref 65–99)

## 2017-06-18 LAB — BLOOD GAS, ARTERIAL
Acid-Base Excess: 2.1 mmol/L — ABNORMAL HIGH (ref 0.0–2.0)
Bicarbonate: 25.4 mmol/L (ref 20.0–28.0)
DRAWN BY: 441371
FIO2: 21
O2 SAT: 96.4 %
PCO2 ART: 34.8 mmHg (ref 32.0–48.0)
PH ART: 7.477 — AB (ref 7.350–7.450)
PO2 ART: 81.7 mmHg — AB (ref 83.0–108.0)
Patient temperature: 98.7

## 2017-06-18 LAB — BASIC METABOLIC PANEL
ANION GAP: 10 (ref 5–15)
BUN: 6 mg/dL (ref 6–20)
CALCIUM: 9 mg/dL (ref 8.9–10.3)
CO2: 25 mmol/L (ref 22–32)
Chloride: 104 mmol/L (ref 101–111)
Creatinine, Ser: 0.51 mg/dL (ref 0.44–1.00)
GFR calc Af Amer: >60 mL/min (ref >60)
GLUCOSE: 125 mg/dL — AB (ref 65–99)
Potassium: 3.1 mmol/L — ABNORMAL LOW (ref 3.5–5.1)
SODIUM: 139 mmol/L (ref 135–145)

## 2017-06-18 LAB — TSH: TSH: 0.941 u[IU]/mL (ref 0.350–4.500)

## 2017-06-18 MED ORDER — LEVETIRACETAM IN NACL 500 MG/100ML IV SOLN
500.0000 mg | Freq: Two times a day (BID) | INTRAVENOUS | Status: DC
  Administered 2017-06-19 (×2): 500 mg via INTRAVENOUS
  Filled 2017-06-18 (×2): qty 100

## 2017-06-18 MED ORDER — LEVETIRACETAM IN NACL 1000 MG/100ML IV SOLN
1000.0000 mg | Freq: Once | INTRAVENOUS | Status: AC
  Administered 2017-06-18: 1000 mg via INTRAVENOUS
  Filled 2017-06-18: qty 100

## 2017-06-18 MED ORDER — MORPHINE SULFATE (PF) 2 MG/ML IV SOLN
1.0000 mg | Freq: Once | INTRAVENOUS | Status: AC
  Administered 2017-06-19: 1 mg via INTRAVENOUS
  Filled 2017-06-18: qty 1

## 2017-06-18 MED ORDER — POTASSIUM CHLORIDE 10 MEQ/100ML IV SOLN
10.0000 meq | INTRAVENOUS | Status: AC
  Administered 2017-06-18 (×2): 10 meq via INTRAVENOUS
  Filled 2017-06-18 (×3): qty 100

## 2017-06-18 MED ORDER — POTASSIUM CHLORIDE CRYS ER 20 MEQ PO TBCR
40.0000 meq | EXTENDED_RELEASE_TABLET | Freq: Once | ORAL | Status: AC
  Administered 2017-06-18: 40 meq via ORAL
  Filled 2017-06-18: qty 2

## 2017-06-18 MED ORDER — HYDRALAZINE HCL 20 MG/ML IJ SOLN: 10.0000 mg | Freq: Four times a day (QID) | INTRAMUSCULAR | Status: DC | PRN

## 2017-06-18 NOTE — Progress Notes (Signed)
LTM started; event button tested. Dr Edison Simon notified.

## 2017-06-18 NOTE — Clinical Social Work Note (Signed)
Clinical Social Work Assessment  Patient Details  Name: JOHNANNA Williams MRN: [id number] Date of Birth: [birth year]  Date of referral:  06/18/17               Reason for consult:  Facility Placement                Permission sought to share information with:  Facility Sport and exercise psychologist, Family Supports Permission granted to share information::  Yes, Verbal Permission Granted  Name::     Public relations account executive::  SNF  Relationship::  Daughter  Contact Information:     Housing/Transportation Living arrangements for the past 2 months:  [place] of Information:  Adult Children Patient Interpreter Needed:  None Criminal Activity/Legal Involvement Pertinent to Current Situation/Hospitalization:  No - Comment as needed Significant Relationships:  Adult Children, Spouse Lives with:  Self, Spouse Do you feel safe going back to the place where you live?  Yes Need for family participation in patient care:  Yes (Comment)  Care giving concerns:  Patient has been at [facility] recently, but family is not happy with care received there. Patient's family would really like it if the patient could go back to [facility] at discharge, but will also be researching other SNF options.   Social Worker assessment / plan:  CSW met with patient's daughter and family at bedside to discuss discharge planning and family concerns. CSW obtained information about patient's recent history, including her stay at [facility] for a little while and then subsequent discharge to [facility] afterwards, and how the family has some conflict between the patient's husband and the patient's daughter (patient's husband is the daughter's stepfather). CSW received permission to fax out referral, and will continue to follow.  Employment status:  Retired Nurse, adult PT Recommendations:  [place] / Referral to community resources:  [place]  Patient/Family's  Response to care:  Patient's daughter agreeable to SNF placement, but hopeful that patient can return to CIR if possible because the therapy she received there was much better than at [facility]  Patient/Family's Understanding of and Emotional Response to Diagnosis, Current Treatment, and Prognosis:  Patient's daughter discussed how she was not happy with the therapy that the patient is receiving at [facility], and how she really wants the patient to have as much therapy as possible to gain skills back. Patient's daughter is hopeful that the patient would be appropriate for another CIR admission, if possible, because the patient did well with the therapy there and gained a lot of skills back. Patient's daughter also aware that it may not be possible and that she also needs to review SNF list to determine where else she would like her mother to go at discharge. Patient's daughter discussed how there's some conflict between her and her stepfather (the patient's husband).  Emotional Assessment Appearance:  Appears stated age Attitude/Demeanor/Rapport:  Unable to Assess Affect (typically observed):  Unable to Assess Orientation:  Oriented to Self Alcohol / Substance use:  Not Applicable Psych involvement (Current and /or in the community):  No (Comment)  Discharge Needs  Concerns to be addressed:  Care Coordination Readmission within the last 30 days:  No Current discharge risk:  Dependent with Mobility Barriers to Discharge:  Continued Medical Work up, [place], [place] 06/18/2017, 10:03 PM

## 2017-06-18 NOTE — Procedures (Signed)
HPI:: 67 year old with renal status change.  TECHNICAL SUMMARY:  A multichannel referential and bipolar montage EEG using the standard international 10-20 system was performed on the patient described as drowsy.  When most awake, a 7 Hz occipital dominant rhythm is noted bilaterally.  However, there is much 2 to 4 Hz activity interspersed on the left side of the brain in all head regions.  4 to 5 Hz activity is also noted in the right hemisphere.  There is decreased beta in the left hemisphere compared to that of the right.    ACTIVATION:  Stepwise photic stimulation and hyperventilation are not performed.  EPILEPTIFORM ACTIVITY:  There were no spikes or paroxysmal activity.  There are triphasic sharp wave forms noted.  SLEEP: No physiologic sleep is noted.  IMPRESSION:  This is an abnormal EEG demonstrating:  1.  Diffuse slowing of electrocerebral activity.  This can be seen in a wide variety of encephalopathic states including those of a toxic, metabolic, or degenerative nature.  2.  Focal slowing with decreased beta activity on the left compared to that of the right.  If not already done so, a structural lesion in this region should be ruled out.  3.  Triphasic sharp wave forms.  These generally are not considered to be epileptiform, but can be seen in states of metabolic encephalopathy.  Correlate clinically.

## 2017-06-18 NOTE — Progress Notes (Signed)
BP = 176/95, HR = 108. She has been restless and mourning , [facility] on call notified

## 2017-06-18 NOTE — Progress Notes (Signed)
Initial Nutrition Assessment  DOCUMENTATION CODES:   Not applicable  INTERVENTION:   -D/c calorie count -If pt remains too lethargic to consume adequate PO's, consider initiation of temporary nutrition support (ex: cortrak tube). Recommend:  Initiate Jevity 1.2 @ 25 ml/hr and increase by 10 ml every 4 hours to goal rate of 65 ml/hr.   Tube feeding regimen provides 1872 kcal (100% of needs), 87 grams of protein, and 1259 ml of H2O.   -If no IVFS, recommend addition of 115 ml free water flush QID (Total free water: 1719 ml)  NUTRITION DIAGNOSIS:   Inadequate oral intake related to lethargy/confusion, acute illness as evidenced by meal completion < 25%.  GOAL:   Patient will meet greater than or equal to 90% of their needs  MONITOR:   PO intake, Supplement acceptance, Labs, Weight trends, Skin, I & O's  REASON FOR ASSESSMENT:   Consult Assessment of nutrition requirement/status, Calorie Count  ASSESSMENT:   Ebony Williams is a 67 y.o. female with medical history significant for UTI 3 weeks ago, with perineal yeast infection 1 week ago, presenting today with altered mental status and FACA.  Pt admitted with acute confusional state with aphasia.   Pt undergoing EEG at time of visit. Pt did not respond to voice or touch. No family present to provide additional hx.   Reviewed records from [facility]. Last Hgb A1c: 6.4 (04/07/17), which indicates good glycemic control. PTA DM medication 500 mg metformin daily.   Reviewed wt hx; noted UBW around 165-175#. Noted pt has experienced a 11.7% wt loss over the past 6 months, which is significant for time frame.    Observed meal trays from breakfast and lunch, both of which were untouched. Pt with SLP evaluation pending; suspect mentation is a large barrier to PO adequacy. Calorie count has been ordered, however, no data collected. As pt is too lethargic to consume meal trays at this time, will discontinue calorie count.   Noted pt  with moderate edema, which is likely masking fat and muscle depletions as well as true wt loss. Unable to identify malnutrition at this time, however, pt is at risk secondary to pre-exisiting weight loss and poor po intake.   Labs reviewed: K: 3.1, CBGS: 152-145 (inpatient orders for glycemic control are 0-5 units insulin aspart q HS, 0-9 units insulin aspart TID with meals,  NUTRITION - FOCUSED PHYSICAL EXAM:    Most Recent Value  Orbital Region  No depletion  Upper Arm Region  No depletion  Thoracic and Lumbar Region  No depletion  Buccal Region  No depletion  Temple Region  No depletion  Clavicle Bone Region  No depletion  Clavicle and Acromion Bone Region  No depletion  Scapular Bone Region  No depletion  Dorsal Hand  No depletion  Patellar Region  No depletion  Anterior Thigh Region  No depletion  Posterior Calf Region  No depletion  Edema (RD Assessment)  Moderate  Hair  Reviewed  Eyes  Unable to assess  Mouth  Reviewed  Skin  Reviewed  Nails  Reviewed       Diet Order:   Diet Order           Diet heart healthy/carb modified Room service appropriate? Yes; Fluid consistency: Thin  Diet effective now          EDUCATION NEEDS:   Not appropriate for education at this time  Skin:  Skin Assessment: Skin Integrity Issues: Skin Integrity Issues:: Incisions Incisions: rt lower leg  Last BM:  06/16/17  Height:   Ht Readings from Last 1 Encounters:  06/16/17 5\' 3"  (1.6 m)    Weight:   Wt Readings from Last 1 Encounters:  06/18/17 157 lb 3 oz (71.3 kg)    Ideal Body Weight:  52.3 kg  BMI:  Body mass index is 27.84 kg/m.  Estimated Nutritional Needs:   Kcal:  1700-1900  Protein:  85-100 grams  Fluid:  1.7-1.9 L    Rochanda Harpham A. Jimmye Norman, RD, LDN, CDE Pager: [phone] After hours Pager: [phone]

## 2017-06-18 NOTE — Progress Notes (Signed)
STROKE TEAM PROGRESS NOTE       SUBJECTIVE (INTERVAL HISTORY) Her daughter , brother and sister in law are  at bedside, pt is getting emotional and crying.the family is reviewed history of presenting illness with me. She's had a gradual decline in her mental status for the last week or so. She had a couple of falls while trying to transfer out of bed. Is no clear new focal weakness noted but MRI scan does show some new diffusion-positive area adjacent to the recent left basal ganglia and posterior parietal infarcts. She has also been found to have recurrent urinary tract infections. She's been treated with antibiotics    OBJECTIVE Temp:  [98.3 F (36.8 C)-99.1 F (37.3 C)] 98.6 F (37 C) (05/13 1231) Pulse Rate:  [84-98] 90 (05/13 1231) Cardiac Rhythm: Normal sinus rhythm (05/13 0700) Resp:  [17-19] 17 (05/13 1231) BP: (140-153)/(64-76) 144/73 (05/13 1231) SpO2:  [95 %-99 %] 99 % (05/13 1231) Weight:  [157 lb 3 oz (71.3 kg)] 157 lb 3 oz (71.3 kg) (05/13 0557)  CBC:  Recent Labs  Lab 06/16/17 1330  06/17/17 0515 06/18/17 0754  WBC 11.9*  --  10.7* 6.8  NEUTROABS 7.8*  --   --   --   HGB 16.3*   < > 15.1* 14.8  HCT 45.4   < > 43.9 42.7  MCV 89.2  --  91.1 90.1  PLT 347  --  289 253   < > = values in this interval not displayed.    Basic Metabolic Panel:  Recent Labs  Lab 06/16/17 1848 06/17/17 0515 06/18/17 0754  NA  --  137 139  K  --  3.7 3.1*  CL  --  101 104  CO2  --  23 25  GLUCOSE  --  121* 125*  BUN  --  13 6  CREATININE  --  0.58 0.51  CALCIUM  --  9.3 9.0  MG 2.0  --   --     Lipid Panel:     Component Value Date/Time   CHOL 159 04/07/2017 0226   TRIG 98 04/07/2017 0226   HDL 32 (L) 04/07/2017 0226   CHOLHDL 5.0 04/07/2017 0226   VLDL 20 04/07/2017 0226   LDLCALC 107 (H) 04/07/2017 0226   HgbA1c:  Lab Results  Component Value Date   HGBA1C 6.4 (H) 04/07/2017   Urine Drug Screen:     Component Value Date/Time   LABOPIA NONE DETECTED  06/16/2017 1645   COCAINSCRNUR NONE DETECTED 06/16/2017 1645   LABBENZ POSITIVE (A) 06/16/2017 1645   AMPHETMU NONE DETECTED 06/16/2017 1645   THCU NONE DETECTED 06/16/2017 1645   LABBARB NONE DETECTED 06/16/2017 1645    Alcohol Level     Component Value Date/Time   ETH <10 04/06/2017 1714    IMAGING  Ct Angio Head W Or Wo Contrast Ct Angio Neck W Or Wo Contrast 06/16/2017 IMPRESSION: No intracranial large or medium vessel occlusion identified. Atherosclerotic tortuosity of the aorta. 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 Minimal atherosclerotic change at the left carotid bifurcation but no stenosis.    Mr Jeri Cos And Wo Contrast 06/16/2017 IMPRESSION:  1. Evolving ischemia with petechial hemorrhage, laminar necrosis, and developing encephalomalacia in the left MCA and PCA parenchyma affected on 04/07/2017. Continued diffusion abnormality in the left basal ganglia and portions of the left occipital lobe. Post ischemic enhancement.  2. Underlying advanced chronic small vessel disease suspected. Pronounced  chronic appearing signal changes in the medial thalami and pons.  3. No new intracranial ischemia or new intracranial abnormality identified.    Ct Head Code Stroke Wo Contrast 06/16/2017 IMPRESSION:  1. Question newly seen low-density in the left side of the pons, which could represent a pontine infarction.  2. Late subacute infarctions in the left occipital lobe and in the left basal ganglia and deep white matter show expected evolutionary changes.  3. Extensive chronic small-vessel ischemic changes elsewhere throughout the hemispheric white matter. 4. ASPECTS is 10. 5.     Dg Chest Portable 1 View 06/16/2017 IMPRESSION:  Mild LEFT basilar atelectasis.    Transthoracic Echocardiogram  04/08/2017 Study Conclusions - Left ventricle: The cavity size was normal. Wall thickness was   increased in a pattern of mild LVH. There was  moderate focal   basal hypertrophy of the septum. Systolic function was vigorous.   The estimated ejection fraction was in the range of 65% to 70%.   Wall motion was normal; there were no regional wall motion   abnormalities. Doppler parameters are consistent with abnormal   left ventricular relaxation (grade 1 diastolic dysfunction). - Aortic valve: There was mild regurgitation. Impressions: - Vigorous LV systolic function; mild LVH with moderate proximal   septal thickening and narrow LVOT; mildly elevated LVOT gradient   (peak velocity 2 m/s); mild diastolic dysfunction; mild AI.   Bilateral Carotid Dopplers -not needed as she had CT angiogram of the neck     PHYSICAL EXAM Vitals:   06/18/17 0029 06/18/17 0557 06/18/17 0801 06/18/17 1231  BP: 140/64 (!) 153/76 (!) 141/73 (!) 144/73  Pulse: 90 84 92 90  Resp: 19 19 18 17   Temp: 99.1 F (37.3 C) 98.3 F (36.8 C) 98.7 F (37.1 C) 98.6 F (37 C)  TempSrc: Oral Oral Oral Oral  SpO2: 97% 95% 97% 99%  Weight:  157 lb 3 oz (71.3 kg)    Height:        General elderly Caucasian lady not in distress  Heart - Regular rate and rhythm - no murmer appreciated Lungs - Clear to auscultation anteriorly Abdomen - Soft - non tender Extremities - Distal pulses intact - no edema Skin - Warm and dry  Neurological Examination Mental Status: Eyes open. Decreased level of alertness with inattention to right side. Not following commands consistently. Crying ntermittently. will occasionally try to stick out her tongue and follow gaze. Cranial Nerves: II: No blink to threat on right. PERRL. III,IV, VI: Eyes at midline, initially not deviating towards or away from visual stimuli with blank stare.  V,VII: Right facial droop. Decreased reaction to right sided stimuli.  VIII: Responds to some simple motor commands IX,X: Nonverbal. Will not open mouth XI: Head rotated preferentially to left XII: Does not protrude tongue Motor: RUE: Increased  tone. Drops immediately to bed when raised. No movement to command or to noxious.  RLE: Increased extensor tone. Drops to bed with slight resistance against gravity. Weak ankle dorsiflexion to noxious plantar stimulation.  LUE: Elevates antigravity without drift. Not following commands for formal strength testing LLE: Elevates antigravity without drift. Withdraws briskly to noxious.  Sensory: Decreased reaction to right sided stimuli.  Cerebellar/Gait: Unable to assess.       ASSESSMENT/PLAN Ebony Williams is a 67 y.o. female with history of hypertension, previous strokes by imaging, and renal calculi brought to the emergency department after she was found nonverbal. She did not receive IV t-PA due to recent infarcts  Stroke:  Recurrent left basal ganglia and MCA branch infarcts with known moderate left MCA stenosis. Unclear whether patient's decline in mental status is related to subacute recurrent strokes versus other treatable etiologies like infection, electrolyte imbalance or seizures  Resultant  Depression, R hemiparesis   CT head - Question newly seen low-density in the left side of the pons, which could represent a pontine infarction.  MRI head - Evolving ischemia with petechial hemorrhage, laminar necrosis, and developing encephalomalacia in the left MCA and PCA parenchyma affected on 04/07/2017. Continued diffusion abnormality in the left basal ganglia and portions of the left occipital lobe.  MRA head - not performed  CTA H&N - No intracranial large or medium vessel occlusion identified.  2D Echo - 04/08/2017 - EF 65 - 70% no cardiac source of emboli identified.  LDL - 107  HgbA1c - 6.4  VTE prophylaxis - subcutaneous heparin Diet Order           Diet heart healthy/carb modified Room service appropriate? Yes; Fluid consistency: Thin  Diet effective now          aspirin 81 mg daily and clopidogrel 75 mg daily prior to admission, now on aspirin 81 mg daily and  clopidogrel 75 mg daily  Patient counseled to be compliant with her antithrombotic medications  Ongoing aggressive stroke risk factor management  Therapy recommendations:  pending  Disposition:  Pending  Hypertension  Stable, soft SBP, will D/C all BP meds for now  Permissive hypertension (OK if < 220/120) but gradually normalize in 5-7 days  Long-term BP goal normotensive  Hyperlipidemia  Lipid lowering medication PTA:  none  LDL 107, goal < 70  Current lipid lowering medication start Lipitor 40 mg daily  Continue statin at discharge   Other Stroke Risk Factors  Advanced age  ETOH use, advised to drink no more than 1 alcoholic beverage per day.  Overweight, Body mass index is 27.84 kg/m., recommend weight loss, diet and exercise as appropriate   Hx stroke/TIA   Other Active Problems  Mild leukocytosis   Plan / Recommendations   Stroke workup: awaiting - echo,    Therapy Follow Up: pending  Disposition: pending  Antiplatelet / Anticoagulation: continue aspirin 81 mg daily and Plavix 75 mg daily  Statin: Lipitor 40 mg daily started  off BP meds and monitor BP  Depression: add prozac 40mg  QHS to help with depression and muscle weakness, off effexor   Poor po intake, nutrition consult and 48H calories intake   Continue ceftriaxone for UTI  Plan discussed in details with her daughter at bedside   MD Follow Up: pending  Other: pending  Further risk factor modification per primary care MD: Follow Up 2 weeks  Recommend check EEG for seizure activity, IV hydration and electrolyte replacement and treatment for UTI. On discussion with the patient, daughter, brother and sister-in-law at the bedside and answered questions. Discussed with Dr. Justice Rocher  Recommend Keppra 1 gm now followed by 500mg  every 12 hourly given abnormal EEG showing left-sided sharp waves   Hospital day # 2  Greater than 50% time during this 35 minute visit was spent on  counseling and coordination of care about her decline in mental status, review of MRI findings and 9 for evaluation and treatment and answering questions.  Antony Contras, MD Medical Director [facility] Pager: [phone] 06/18/2017 1:34 PM To contact Stroke Continuity provider, please refer to [email or web address] After hours, contact General Neurology

## 2017-06-18 NOTE — Progress Notes (Signed)
EEG Completed; Results Pending  

## 2017-06-18 NOTE — Evaluation (Signed)
Speech Language Pathology Evaluation Patient Details Name: Ebony Williams MRN: [id number] DOB: Jun 25, 1950 Today's Date: 06/18/2017 Time: 7062-3762 SLP Time Calculation (min) (ACUTE ONLY): 30 min  Problem List:  Patient Active Problem List   Diagnosis Date Noted  . Right arm weakness 06/16/2017  . Aphasia 06/16/2017  . Spastic hemiplegia of right dominant side as late effect of cerebral infarction ([place])   . Hyponatremia   . Labile blood pressure   . Hypokalemia   . Elevated BUN   . Lethargy   . Transaminitis   . Essential hypertension   . Right hemiplegia ([place])   . Hypertensive crisis   . 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 Depression   . Dysphagia, post-stroke   . Diastolic dysfunction   . Benign essential HTN   . Prediabetes   . Leukocytosis   . Cerebral infarction due to occlusion of left middle cerebral artery ([place]) s/p IV tPA 04/06/2017  . Stroke (cerebrum) ([place]) 04/06/2017  . 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 Pain in joint, shoulder region 10/18/2011   Past Medical History:  Past Medical History:  Diagnosis Date  . History of kidney stones    x1  . Hypertension    Past Surgical History:  Past Surgical History:  Procedure Laterality Date  . CHOLECYSTECTOMY     laparoscopic  . COLONOSCOPY WITH PROPOFOL N/A 01/12/2015   Procedure: COLONOSCOPY WITH PROPOFOL;  Surgeon: Garlan Fair, MD;  Location: [facility] ENDOSCOPY;  Service: Endoscopy;  Laterality: N/A;  . TUBAL LIGATION     HPI:  67 year old with past medical history relevant for prior CVA march 2019 with right-sided weakness, left gaze deviation and aphasia status post treatment with TPA, hypertension, history of nephrolithiasis who was D/Cd to [facility] CIR, then to SNF rehab, comes in from SNF with altered mental status.  MRI Evolving ischemia with petechial hemorrhage, laminar necrosis, and developing encephalomalacia in the left MCA and PCA parenchyma affected on 04/07/2017. Continued diffusion  abnormality in the left basal ganglia and portions of the left occipital lobe.  Question newly seen low-density in the left side of the pons, which could represent a pontine infarction.    Assessment / Plan / Recommendation Clinical Impression  Pt presents with lethargy and is essentially nonverbal at this time. As a result of her fatigue/lethargy, It is difficult to deteremine level of global aphasia and cognitive worsening. At this time she is not able to sustain attention, initiate task/communication, follow 1 step directions despite Total A cues and is not able to indicate any yes/no  responses. Pt able to make brief eye contact at midline but not able to find SLP on pt's right. During SLE, pt was too lethargic to consume PO intake. Per her daughter, pt has advanced to regular with thin liquids (no compensatory strategies) with FEES performed at [employer]. Recommend BSE when pt is more alert. If lethargy continues, pt may require temporary means of nutrition. Edcuation provided to daughter and all questions answered to her satisfaction at this time.     SLP Assessment  SLP Recommendation/Assessment: Patient needs continued Speech Lanaguage Pathology Services SLP Visit Diagnosis: Aphasia (R47.01);Cognitive communication deficit (R41.841)    Follow Up Recommendations  Inpatient Rehab    Frequency and Duration min 2x/week  2 weeks      SLP Evaluation Cognition  Overall Cognitive Status: Impaired/Different from baseline Arousal/Alertness: Awake/alert(With Max A cues) Orientation Level: Oriented to person;Oriented to place;Disoriented to time;Disoriented to situation(indicated wtih head movement to hospital) Attention:  Focused Focused Attention: Impaired Focused Attention Impairment: Verbal basic;Functional basic Problem Solving: Impaired Problem Solving Impairment: Verbal basic;Functional basic       Comprehension  Auditory Comprehension Overall Auditory Comprehension:  Impaired Yes/No Questions: Impaired Basic Biographical Questions: 0-25% accurate Commands: Impaired One Step Basic Commands: 0-24% accurate Visual Recognition/Discrimination Discrimination: Not tested(no scanning to right) Reading Comprehension Reading Status: Not tested    Expression Expression Primary Mode of Expression: Verbal Verbal Expression Overall Verbal Expression: Impaired Initiation: Impaired Automatic Speech: (no speech) Written Expression Dominant Hand: Right Written Expression: Not tested   Oral / Motor  Oral Motor/Sensory Function Overall Oral Motor/Sensory Function: (Unable to assess) Motor Speech Overall Motor Speech: Impaired Respiration: Within functional limits   GO                    [name] 06/18/2017, 1:33 PM

## 2017-06-19 ENCOUNTER — Ambulatory Visit: Payer: Self-pay | Admitting: Adult Health

## 2017-06-19 DIAGNOSIS — R4182 Altered mental status, unspecified: Secondary | ICD-10-CM

## 2017-06-19 LAB — CBC
HEMATOCRIT: 43.5 % (ref 36.0–46.0)
HEMOGLOBIN: 15.4 g/dL — AB (ref 12.0–15.0)
MCH: 31.4 pg (ref 26.0–34.0)
MCHC: 35.4 g/dL (ref 30.0–36.0)
MCV: 88.8 fL (ref 78.0–100.0)
Platelets: 265 10*3/uL (ref 150–400)
RBC: 4.9 MIL/uL (ref 3.87–5.11)
RDW: 13.2 % (ref 11.5–15.5)
WBC: 8.4 10*3/uL (ref 4.0–10.5)

## 2017-06-19 LAB — BASIC METABOLIC PANEL
Anion gap: 13 (ref 5–15)
BUN: 6 mg/dL (ref 6–20)
CHLORIDE: 103 mmol/L (ref 101–111)
CO2: 22 mmol/L (ref 22–32)
CREATININE: 0.49 mg/dL (ref 0.44–1.00)
Calcium: 8.7 mg/dL — ABNORMAL LOW (ref 8.9–10.3)
GFR calc Af Amer: >60 mL/min (ref >60)
GFR calc non Af Amer: >60 mL/min (ref >60)
Glucose, Bld: 121 mg/dL — ABNORMAL HIGH (ref 65–99)
Potassium: 4.2 mmol/L (ref 3.5–5.1)
Sodium: 138 mmol/L (ref 135–145)

## 2017-06-19 LAB — GLUCOSE, CAPILLARY
Glucose-Capillary: 124 mg/dL — ABNORMAL HIGH (ref 65–99)
Glucose-Capillary: 147 mg/dL — ABNORMAL HIGH (ref 65–99)
Glucose-Capillary: 131 mg/dL — ABNORMAL HIGH (ref 65–99)
Glucose-Capillary: 114 mg/dL — ABNORMAL HIGH (ref 65–99)

## 2017-06-19 LAB — URINE CULTURE

## 2017-06-19 MED ORDER — ACETAMINOPHEN 500 MG PO TABS
500.0000 mg | ORAL_TABLET | Freq: Three times a day (TID) | ORAL | Status: DC
  Administered 2017-06-19 – 2017-06-22 (×10): 500 mg via ORAL
  Filled 2017-06-19 (×10): qty 1

## 2017-06-19 MED ORDER — LEVETIRACETAM IN NACL 500 MG/100ML IV SOLN
500.0000 mg | Freq: Once | INTRAVENOUS | Status: AC
  Administered 2017-06-19: 500 mg via INTRAVENOUS
  Filled 2017-06-19: qty 100

## 2017-06-19 MED ORDER — LEVETIRACETAM IN NACL 1000 MG/100ML IV SOLN
1000.0000 mg | Freq: Two times a day (BID) | INTRAVENOUS | Status: DC
  Administered 2017-06-20: 1000 mg via INTRAVENOUS
  Filled 2017-06-19 (×2): qty 100

## 2017-06-19 MED ORDER — AMANTADINE HCL 100 MG PO CAPS
100.0000 mg | ORAL_CAPSULE | Freq: Two times a day (BID) | ORAL | Status: DC
  Administered 2017-06-19 – 2017-06-22 (×7): 100 mg via ORAL
  Filled 2017-06-19 (×10): qty 1

## 2017-06-19 NOTE — Progress Notes (Signed)
LTM discontinued; no skin breakdown was seen. 

## 2017-06-19 NOTE — Care Management Note (Signed)
Case Management Note  Patient Details  Name: Ebony Williams MRN: [id number] Date of Birth: 12/16/1950  Subjective/Objective:    Pt admitted with aphasia. She has been at [facility] for rehab.                 Action/Plan: PT/OT recommending return to SNF. CM following for d/c disposition.   Expected Discharge Date:                  Expected Discharge Plan:  Skilled Nursing Facility  In-House Referral:  Clinical Social Work  Discharge planning Services     Post Acute Care Choice:    Choice offered to:     DME Arranged:    DME Agency:     HH Arranged:    [place] Agency:     Status of Service:  In process, will continue to follow  If discussed at Long Length of Stay Meetings, dates discussed:    Additional Comments:  Pollie Friar, RN 06/19/2017, 11:26 AM

## 2017-06-19 NOTE — Progress Notes (Addendum)
PT Cancellation Note  Patient Details Name: Ebony Williams MRN: [id number] DOB: 1951/01/02   Cancelled Treatment:    Reason Eval/Treat Not Completed: Fatigue/lethargy limiting ability to participate;Patient not medically ready. Patient on continuous EEG and RN reported pt is likely too lethargic to participate at this time. PT will continue to follow acutely.    Salina April, PTA Pager: [phone]   06/19/2017, 1:29 PM

## 2017-06-19 NOTE — Consult Note (Signed)
PULMONARY / CRITICAL CARE MEDICINE   Name: Ebony Williams MRN: [id number] DOB: Mar 17, 1950    ADMISSION DATE:  06/16/2017 CONSULTATION DATE:  06/19/17  REFERRING MD: Tyrell Antonio  CHIEF COMPLAINT:   Altered mental status  HISTORY OF PRESENT ILLNESS:         Ebony R Springsis a 67 y.o.femalewith medical history significant forUTI 3 weeks ago, with perineal yeast infection 1 week ago, presenting today with altered mental status and FACA. The patient was independent prior to sustaining a stroke in April 06, 2017, at the time suffering right sided weakness, left gaze and aphasia. She was treated with TPA and discharged home on aspirin and Plavix. The patient has been at the rehab center.Until 10 AM today she was seen her usual state of health, however her daughter came to visit around 69 and found her to be confused. She was not responding to her questions. The patient of note, had a fall yesterday, not hitting her head, but today did not ambulate, was very tearful at the emergency department, and was nonconversant.. Stroke was called at 1313, EMS arrived at 1320 4 PM, NIH SS on arrival was 14,more responsive from reported. CT of the head was negative for acute findings.     5/14 I was asked to see the patient because her mental status appears to have deteroriated. The patient is more lethargic the past  2 days.  Admitted with encephalopathy, AMS. Found to have evolving stroke. Also treating for UTI and possible seizure.There appear to be some changes in the left basal gangllia where thepatient had her original stroke in March.There is a diffusion abnormality in the left occipital lobe. neurolgy is not sure that hheses changes account for her worsening mental status. There was apparently some breif seizure activity as well noted on  a24 hour EEG yesterday. Neurology just increased her [place]. In additon, the patient was off amantidine ( unsure why she was on it) buy it was just restarted. hrer  daughter is concerned with her worsening mental status. Appears able to swallow. Opens her eyes and can drink some ensure at this time.     PAST MEDICAL HISTORY :  She  has a past medical history of History of kidney stones and Hypertension.  PAST SURGICAL HISTORY: She  has a past surgical history that includes Cholecystectomy; Tubal ligation; and Colonoscopy with propofol (N/A, 01/12/2015).  Allergies  Allergen Reactions  . Sulfa Antibiotics Diarrhea and Itching    No current facility-administered medications on file prior to encounter.    Current Outpatient Medications on File Prior to Encounter  Medication Sig  . ALPRAZolam (XANAX) 0.25 MG tablet Take 0.25 mg by mouth at bedtime.  [name] amLODipine (NORVASC) 10 MG tablet Take 1 tablet (10 mg total) by mouth daily.  [name] aspirin 81 MG chewable tablet Chew 1 tablet (81 mg total) by mouth daily.  . chlorthalidone (HYGROTON) 25 MG tablet Take 1 tablet (25 mg total) by mouth daily.  . clopidogrel (PLAVIX) 75 MG tablet Take 1 tablet (75 mg total) by mouth daily. (Patient taking differently: Take 75 mg by mouth every evening. )  . irbesartan (AVAPRO) 300 MG tablet Take 1 tablet (300 mg total) by mouth daily.  . metFORMIN (GLUCOPHAGE) 500 MG tablet Take 1 tablet (500 mg total) by mouth daily with breakfast.  . nystatin (NYSTATIN) powder Apply topically See admin instructions. Apply topically to groin and outer vaginal area daily after bath and twice daily as needed for rash/irritation  . ondansetron (  ZOFRAN) 4 MG tablet Take 4 mg by mouth every 8 (eight) hours as needed for nausea or vomiting.  . pantoprazole (PROTONIX) 40 MG tablet Take 1 tablet (40 mg total) by mouth daily.  . polyethylene glycol (MIRALAX / GLYCOLAX) packet Take 17 g by mouth daily. Mix with 8-10 oz water and drink  . PRESCRIPTION MEDICATION Inhale into the lungs at bedtime. CPAP  . tiZANidine (ZANAFLEX) 2 MG tablet Take 2 mg by mouth 2 (two) times daily.   [name] venlafaxine XR  (EFFEXOR-XR) 75 MG 24 hr capsule Take 75 mg by mouth every evening.   [name] ALPRAZolam (XANAX) 0.5 MG tablet Take 1 tablet (0.5 mg total) by mouth 3 (three) times daily as needed for anxiety or sleep. (Patient not taking: Reported on 06/16/2017)  . amantadine (SYMMETREL) 100 MG capsule Take 2 capsules (200 mg total) by mouth 2 (two) times daily. (Patient not taking: Reported on 06/16/2017)  . atorvastatin (LIPITOR) 10 MG tablet Take 1 tablet (10 mg total) by mouth daily at 6 PM. (Patient not taking: Reported on 06/16/2017)    FAMILY HISTORY:  Her indicated that her mother is alive. She indicated that her father is alive.   SOCIAL HISTORY: She  reports that she has never smoked. She has never used smokeless tobacco. She reports that she drinks alcohol. She reports that she does not use drugs.  REVIEW OF SYSTEMS:   Not able to furnish ROS     VITAL SIGNS: BP (!) 152/72 (BP Location: Right Arm)   Pulse 87   Temp 98.5 F (36.9 C) (Axillary)   Resp 17   Ht 5\' 3"  (1.6 m)   Wt 160 lb 7.9 oz (72.8 kg)   SpO2 100%   BMI 28.43 kg/m           INTAKE / OUTPUT: I/O last 3 completed shifts: In: 4180 [P.O.:180; I.V.:3900; IV Piggyback:100] Out: -   PHYSICAL EXAMINATION: General: Patient appears stated age Neuro: Eyes are open and she tracks and turna her head. Will not speak presently. Has known right hemiparesis frommher stroke. HEENT:  [place]/AT Cardiovascular:  RRR s1 S2 Lungs:  Bilateral BS, no wheeze or rales Abdomen:soft , BS +, non-tender Extr: right arm in splint Skin:  unremarkable  LABS:  BMET Recent Labs  Lab 06/17/17 0515 06/18/17 0754 06/19/17 0616  NA 137 139 138  K 3.7 3.1* 4.2  CL 101 104 103  CO2 23 25 22   BUN 13 6 6   CREATININE 0.58 0.51 0.49  GLUCOSE 121* 125* 121*    Electrolytes Recent Labs  Lab 06/16/17 1848 06/17/17 0515 06/18/17 0754 06/19/17 0616  CALCIUM  --  9.3 9.0 8.7*  MG 2.0  --   --   --     CBC Recent Labs  Lab 06/17/17 0515  06/18/17 0754 06/19/17 0616  WBC 10.7* 6.8 8.4  HGB 15.1* 14.8 15.4*  HCT 43.9 42.7 43.5  PLT 289 253 265    Coag's Recent Labs  Lab 06/16/17 1330  APTT 29  INR 1.12    Sepsis Markers Recent Labs  Lab 06/16/17 1848  LATICACIDVEN 1.7    ABG Recent Labs  Lab 06/18/17 1049  PHART 7.477*  PCO2ART 34.8  PO2ART 81.7*    Liver Enzymes Recent Labs  Lab 06/16/17 1459  AST 29  ALT 26  ALKPHOS 82  BILITOT 1.2  ALBUMIN 3.6    Cardiac Enzymes No results for input(s): TROPONINI, PROBNP in the last 168 hours.  Glucose Recent  Labs  Lab 06/18/17 1109 06/18/17 1535 06/18/17 2149 06/19/17 0647 06/19/17 1146 06/19/17 1707  GLUCAP 145* 131* 127* 124* 147* 131*    Diagnostic tests  ECHO; had echo last admission. Neurology recommending echo CT angio; No intracranial large or medium vessel occlusion identified.no need for carotid doppler.  Appreciate neurology evaluation.   STUDIES:   MRI brain 06/16/17 IMPRESSION: 1. Evolving ischemia with petechial hemorrhage, laminar necrosis, and developing encephalomalacia in the left MCA and PCA parenchyma affected on 04/07/2017. Continued diffusion abnormality in the left basal ganglia and portions of the left occipital lobe. Post ischemic enhancement.  2. Underlying advanced chronic small vessel disease suspected. Pronounced chronic appearing signal changes in the medial thalami and pons.  3. No new intracranial ischemia or new intracranial abnormality identified.    CULTURES:  urine culture positive for klebs pn.  ANTIBIOTICS: Rocephin     ASSESSMENT / PLAN: Neuro The patient is more lethargivc. The cause is not entirely clear. She amy get worse requiring additional support in the next 12-24 hours. Her Keppra dose was just increased. There does j not appear to have been a gross focal change. The lethargy could be sepsis related but the patient is on appropriate treatment for her UTI. May need  additional imaging if her condition deteriorates and EEG. Neuro will follow the patient. Her blood sugar, BP and pulse ox were all within normal limits. Recent ABG showed eucapnia. WBC in the normal range.  UTI The patient has been on Rocephin since 5/12.  [facility] [place] Pager: [phone]  06/19/2017, 5:32 PM

## 2017-06-19 NOTE — Procedures (Signed)
Continuous Video-EEG Monitoring Report   Name:   Jake, Goodson  MRN:   [id number] Study Duration: 06/18/2017 12:31 to 06/19/2017 07:30 CPT Code:  37482 Diagnosis:  Altered mental status (R41.82)  History: This is a 67 year old female with a recent history of left hemispheric stroke now presenting with altered mental status.  Video-EEG monitoring was performed to evaluate for seizures.  Technical Details:  Long-term video-EEG monitoring was performed using standard setting per the guidelines.  Briefly, a minimum of 21 electrodes were placed on scalp according to the International 10-20 or/and 10-10 Systems.  Supplemental electrodes were placed as needed.  Single EKG electrode was also used to detect cardiac arrhythmia.  Patient's behavior was continuously recorded on video simultaneously with EEG.  A minimum of 16 channels were used for data display.  Each epoch of study was reviewed manually daily and as needed using standard referential and bipolar montages.    EEG Description:  There was background delta and theta slowing.  No clear posterior dominant rhythm or sleep architecture was recorded.  Prominent focal polymorphic delta slowing was present in the left hemisphere.  Frequent focal spikes or sharp waves were present in the left posterior temporoparietal region, which waxed and waned and oftentimes appeared semi-periodic without clear motor correlates on video.      Impression:  This is an abnormal EEG due to the presence of the following: 1) Background delta and theta slowing, suggesting moderate to severe encephalopathy, which is nonspecific as to etiology; 2) Prominent focal polymorphic delta slowing in the left hemisphere, suggesting left hemispheric dysfunction and/or structural lesion, consistent with patient's history of stroke; 3) Frequent epileptiform discharges in the left posterior temporoparietal region, which waxed and waned and oftentimes appeared semi-periodic, concerning for  intermittent focal electrographic seizures.    [place] Physician: Winfield Cunas, MD, PhD

## 2017-06-19 NOTE — Progress Notes (Addendum)
PROGRESS NOTE    Ebony Williams  JKK:[id number] DOB: Feb 16, 1950 DOA: 06/16/2017 PCP: Ebony Sites, MD    Brief Narrative: Ebony Williams is a 67 y.o. female with medical history significant for UTI 3 weeks ago, with perineal yeast infection 1 week ago, presenting today with altered mental status and FACA.  The patient was independent prior to sustaining a stroke in April 06, 2017, at the time suffering right sided weakness, left gaze and aphasia.  She was treated with TPA and discharged home on aspirin and Plavix.  The patient has been at the rehab center.Until 10 AM today she was seen her usual state of health, however her daughter came to visit around 29 and found her to be confused.  She was not responding to her questions.  The patient of note, had a fall yesterday, not hitting her head, but today did not ambulate, was very tearful at the emergency department, and was nonconversant..  Stroke was called at 1313, EMS arrived at 1320 4 PM, NIH SS on arrival was 14, more responsive from reported.  CT of the head was negative for acute findings.   Admitted with encephalopathy, AMS. Found to have evolving stroke. Also treating for UTI and possible seizure.    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  MRI;  Evolving ischemia with petechial hemorrhage, laminar necrosis, and developing encephalomalacia in the left MCA and PCA parenchyma affected on 04/07/2017. Continued diffusion abnormality in the left basal ganglia and portions of the left occipital lobe. Post ischemic enhancement On aspirin and Plavix  ECHO; had echo last admission. Neurology recommending  echo CT angio; No intracranial large or medium vessel occlusion identified.no need for carotid doppler.  Appreciate neurology evaluation.  Dr Leonie Man recommend to keep keppra due to abnormal EEG left side showed brief electrographic seizure.  Patient continue to be lethargic, hard to wake up. Wake up for few moments, follows some commands. Will ask CCM for evaluation, for transfer to ICU for close monitoring.   Acute encephalopathy; unclear if MS is related to worsening stroke or other reversible etiology. Treating for UTI and Evaluating for seizure.  She was more lethargic 5-12 after  Vicodin. Opioids discontinue Stroke vs infection UTI.  Vitals stable. CBG check and it was 141.  Treating also for UTI.  ABG negative for hypercapnia. EEG continuo pending.  Patient was previously on amantadine. Discussed with Dr Leonie Man, resume amantadine 100 mg BID>  She keep eyes close on my evaluation. Would move left arm passively.  Transfer to step down unit, for close monitoring of MS>    UTI; urine culture growing Klebsiella.  Continue with ceftriaxone.   Hypokalemia;  IV KCL.   Hyponatremia; improved with IV fluids.   Type II DM;  On metformin.  SSI while in patient.   HTN; permissive HTN Holding Norvasc.   HLD;  Continue with statins.   Anxiety , depression;  Hold xanax. Started on prozac.   Pain ? Unclear were patient is having pain. She does has history pain right arm. Will schedule tylenol. Avoid sedatives as much as possible.   DVT prophylaxis: heparin  Code Status: full  code.  Family Communication: Daughter who was at bedside.  Disposition Plan: back to SNF when stable.    Consultants:   Neurology    Procedures:   Doppler;   Antimicrobials:  Ceftriaxone 5-12  Subjective: Patient is sleepy, wake up to stimuli, would follow some command.    Objective: Vitals:   06/19/17 0400 06/19/17 0500 06/19/17 0800 06/19/17 1251  BP: (!) 147/81  (!) 159/77 (!) 152/72   Pulse: 92  94 87  Resp:   15 17  Temp: 98.8 F (37.1 C)   98.5 F (36.9 C)  TempSrc:    Axillary  SpO2:   100% 100%  Weight:  72.8 kg (160 lb 7.9 oz)    Height:        Intake/Output Summary (Last 24 hours) at 06/19/2017 1531 Last data filed at 06/19/2017 1256 Gross per 24 hour  Intake 1778.33 ml  Output 700 ml  Net 1078.33 ml   Filed Weights   06/16/17 1358 06/18/17 0557 06/19/17 0500  Weight: 69.2 kg (152 lb 8.9 oz) 71.3 kg (157 lb 3 oz) 72.8 kg (160 lb 7.9 oz)    Examination:  General exam: keep eyes close.  Respiratory system: CTA Cardiovascular system: S 1, S 2 RRR Gastrointestinal system: BS present, soft, nt.  Central nervous system: wake up to painful stimuli, open eyes for few minutes. Moves left Upper extremity and squeeze finger.  Extremities: no edema.  Skin: No rashes, lesions or ulcers   Data Reviewed: I have personally reviewed following labs and imaging studies  CBC: Recent Labs  Lab 06/16/17 1330 06/16/17 1453 06/17/17 0515 06/18/17 0754 06/19/17 0616  WBC 11.9*  --  10.7* 6.8 8.4  NEUTROABS 7.8*  --   --   --   --   HGB 16.3* 15.3* 15.1* 14.8 15.4*  HCT 45.4 45.0 43.9 42.7 43.5  MCV 89.2  --  91.1 90.1 88.8  PLT 347  --  289 253 226   Basic Metabolic Panel: Recent Labs  Lab 06/16/17 1453 06/16/17 1459 06/16/17 1848 06/17/17 0515 06/18/17 0754 06/19/17 0616  NA 132* 134*  --  137 139 138  K 2.9* 3.3*  --  3.7 3.1* 4.2  CL 95* 99*  --  101 104 103  CO2  --  23  --  23 25 22   GLUCOSE 119* 128*  --  121* 125* 121*  BUN 15 15  --  13 6 6   CREATININE 0.40* 0.61  --  0.58 0.51 0.49  CALCIUM  --  9.1  --  9.3 9.0 8.7*  MG  --   --  2.0  --   --   --    GFR: Estimated Creatinine Clearance: 63.5 mL/min (by C-G formula based on SCr of 0.49 mg/dL). Liver Function Tests: Recent Labs  Lab 06/16/17 1459  AST 29  ALT 26  ALKPHOS 82  BILITOT 1.2  PROT 6.8  ALBUMIN 3.6   No results for input(s): LIPASE, AMYLASE in the last 168  hours. No results for input(s): AMMONIA in the last 168 hours. Coagulation Profile: Recent Labs  Lab 06/16/17 1330  INR 1.12   Cardiac Enzymes: No results for input(s): CKTOTAL, CKMB, CKMBINDEX, TROPONINI in the last 168 hours. BNP (last 3 results) No results for input(s): PROBNP in the last 8760 hours. HbA1C: No results for input(s): HGBA1C in the last 72 hours. CBG: Recent Labs  Lab 06/18/17 1109 06/18/17 1535 06/18/17 2149 06/19/17 0647 06/19/17 1146  GLUCAP 145* 131*  127* 124* 147*   Lipid Profile: No results for input(s): CHOL, HDL, LDLCALC, TRIG, CHOLHDL, LDLDIRECT in the last 72 hours. Thyroid Function Tests: Recent Labs    06/18/17 1050  TSH 0.941   Anemia Panel: No results for input(s): VITAMINB12, FOLATE, FERRITIN, TIBC, IRON, RETICCTPCT in the last 72 hours. Sepsis Labs: Recent Labs  Lab 06/16/17 1848  LATICACIDVEN 1.7    Recent Results (from the past 240 hour(s))  Urine culture     Status: Abnormal   Collection Time: 06/16/17  4:45 PM  Result Value Ref Range Status   Specimen Description URINE, RANDOM  Final   Special Requests   Final    NONE Performed at [facility], [address] [place], [place] [address]    Culture >=100,000 COLONIES/mL KLEBSIELLA PNEUMONIAE (A)  Final   Report Status 06/19/2017 FINAL  Final   Organism ID, Bacteria KLEBSIELLA PNEUMONIAE (A)  Final      Susceptibility   Klebsiella pneumoniae - MIC*    AMPICILLIN RESISTANT Resistant     CEFAZOLIN <=4 SENSITIVE Sensitive     CEFTRIAXONE <=1 SENSITIVE Sensitive     CIPROFLOXACIN <=0.25 SENSITIVE Sensitive     GENTAMICIN <=1 SENSITIVE Sensitive     IMIPENEM <=0.25 SENSITIVE Sensitive     NITROFURANTOIN 64 INTERMEDIATE Intermediate     TRIMETH/SULFA <=20 SENSITIVE Sensitive     AMPICILLIN/SULBACTAM 4 SENSITIVE Sensitive     PIP/TAZO <=4 SENSITIVE Sensitive     Extended ESBL NEGATIVE Sensitive     * >=100,000 COLONIES/mL KLEBSIELLA PNEUMONIAE  MRSA PCR Screening      Status: None   Collection Time: 06/16/17  6:51 PM  Result Value Ref Range Status   MRSA by PCR NEGATIVE NEGATIVE Final    Comment:        The GeneXpert MRSA Assay (FDA approved for NASAL specimens only), is one component of a comprehensive MRSA colonization surveillance program. It is not intended to diagnose MRSA infection nor to guide or monitor treatment for MRSA infections. Performed at [facility], [place] [address] [place], [place] [address]          Radiology Studies: No results found.      Scheduled Meds: . acetaminophen  500 mg Oral TID  . amantadine  100 mg Oral BID  . aspirin  81 mg Oral Daily  . atorvastatin  40 mg Oral q1800  . clopidogrel  75 mg Oral Daily  . FLUoxetine  40 mg Oral QHS  . heparin  5,000 Units Subcutaneous Q8H  . insulin aspart  0-5 Units Subcutaneous QHS  . insulin aspart  0-9 Units Subcutaneous TID WC  . pantoprazole  40 mg Oral Daily   Continuous Infusions: . sodium chloride 125 mL/hr at 06/19/17 1209  . cefTRIAXone (ROCEPHIN)  IV Stopped (06/19/17 1148)  . [START ON 06/20/2017] levETIRAcetam       LOS: 3 days    Time spent: 35 minutes.     Elmarie Shiley, MD [facility] Pager [phone]  If 7PM-7AM, please contact night-coverage [email or web address] Password [facility] 06/19/2017, 3:31 PM

## 2017-06-19 NOTE — Progress Notes (Signed)
  Speech Language Pathology Treatment: Cognitive-Linquistic  Patient Details Name: Ebony Williams MRN: [id number] DOB: 1950-02-09 Today's Date: 06/19/2017 Time: 7619-5093 SLP Time Calculation (min) (ACUTE ONLY): 13 min  Assessment / Plan / Recommendation Clinical Impression  Skilled treatment session focused on cognition. SLP received pt in bed, daughter and pt's husband present. Pt continues to be very lethargic with moaning. With SLP cues pt is able to open eyes briefly and look at SLP but no attempts to communicate, inability to follow directions or sustain attention. Pt appears worse d/t moaning and lethargy. Support given to pt's daughter. SLP spoke with MD Leonie Man) regarding session and appearance of moderate to severe encephalopathy that is further complicated by baseline expressive aphasia and cognitive deficits. Pt continues to be too lethargic for PO intake. MD monitoring.    HPI HPI: 67 year old with past medical history relevant for prior CVA march 2019 with right-sided weakness, left gaze deviation and aphasia status post treatment with TPA, hypertension, history of nephrolithiasis who was D/Cd to [facility] CIR, then to SNF rehab, comes in from SNF with altered mental status.  MRI Evolving ischemia with petechial hemorrhage, laminar necrosis, and developing encephalomalacia in the left MCA and PCA parenchyma affected on 04/07/2017. Continued diffusion abnormality in the left basal ganglia and portions of the left occipital lobe.  Question newly seen low-density in the left side of the pons, which could represent a pontine infarction.       SLP Plan  Continue with current plan of care       Recommendations                   General recommendations: Rehab consult Oral Care Recommendations: Oral care QID Follow up Recommendations: Inpatient Rehab SLP Visit Diagnosis: Aphasia (R47.01);Cognitive communication deficit (R41.841) Plan: Continue with current plan of care        [place] 06/19/2017, 12:17 PM

## 2017-06-19 NOTE — Progress Notes (Signed)
STROKE TEAM PROGRESS NOTE       SUBJECTIVE (INTERVAL HISTORY) [name] , Ebony Williams and Ebony Williams are  at bedside, pt is  More alert and interactive today but not speaking a lot. 24-hour long-term video EEG shows significant background slowing and focal left hemispheric slowing as well as with feels short lasting electrographic seizures.   OBJECTIVE Temp:  [97.8 F (36.6 C)-99 F (37.2 C)] 98.5 F (36.9 C) (05/14 1251) Pulse Rate:  [84-102] 87 (05/14 1251) Cardiac Rhythm: Sinus bradycardia (05/14 0700) Resp:  [15-17] 17 (05/14 1251) BP: (141-176)/(68-95) 152/72 (05/14 1251) SpO2:  [98 %-100 %] 100 % (05/14 1251) Weight:  [160 lb 7.9 oz (72.8 kg)] 160 lb 7.9 oz (72.8 kg) (05/14 0500)  CBC:  Recent Labs  Lab 06/16/17 1330  06/18/17 0754 06/19/17 0616  WBC 11.9*   < > 6.8 8.4  NEUTROABS 7.8*  --   --   --   HGB 16.3*   < > 14.8 15.4*  HCT 45.4   < > 42.7 43.5  MCV 89.2   < > 90.1 88.8  PLT 347   < > 253 265   < > = values in this interval not displayed.    Basic Metabolic Panel:  Recent Labs  Lab 06/16/17 1848  06/18/17 0754 06/19/17 0616  NA  --    < > 139 138  K  --    < > 3.1* 4.2  CL  --    < > 104 103  CO2  --    < > 25 22  GLUCOSE  --    < > 125* 121*  BUN  --    < > 6 6  CREATININE  --    < > 0.51 0.49  CALCIUM  --    < > 9.0 8.7*  MG 2.0  --   --   --    < > = values in this interval not displayed.    Lipid Panel:     Component Value Date/Time   CHOL 159 04/07/2017 0226   TRIG 98 04/07/2017 0226   HDL 32 (L) 04/07/2017 0226   CHOLHDL 5.0 04/07/2017 0226   VLDL 20 04/07/2017 0226   LDLCALC 107 (H) 04/07/2017 0226   HgbA1c:  Lab Results  Component Value Date   HGBA1C 6.4 (H) 04/07/2017   Urine Drug Screen:     Component Value Date/Time   LABOPIA NONE DETECTED 06/16/2017 1645   COCAINSCRNUR NONE DETECTED 06/16/2017 1645   LABBENZ POSITIVE (A) 06/16/2017 1645   AMPHETMU NONE DETECTED 06/16/2017 1645   THCU NONE DETECTED 06/16/2017  1645   LABBARB NONE DETECTED 06/16/2017 1645    Alcohol Level     Component Value Date/Time   ETH <10 04/06/2017 1714    IMAGING  Ct Angio Head W Or Wo Contrast Ct Angio Neck W Or Wo Contrast 06/16/2017 IMPRESSION: No intracranial large or medium vessel occlusion identified. Atherosclerotic tortuosity of the aorta. 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 Minimal atherosclerotic change at the left carotid bifurcation but no stenosis.    Mr Ebony Williams And Wo Contrast 06/16/2017 IMPRESSION:  1. Evolving ischemia with petechial hemorrhage, laminar necrosis, and developing encephalomalacia in the left MCA and PCA parenchyma affected on 04/07/2017. Continued diffusion abnormality in the left basal ganglia and portions of the left occipital lobe. Post ischemic enhancement.  2. Underlying advanced chronic small vessel disease suspected. Pronounced chronic appearing signal  changes in the medial thalami and pons.  3. No new intracranial ischemia or new intracranial abnormality identified.    Ct Head Code Stroke Wo Contrast 06/16/2017 IMPRESSION:  1. Question newly seen low-density in the left side of the pons, which could represent a pontine infarction.  2. Late subacute infarctions in the left occipital lobe and in the left basal ganglia and deep white matter show expected evolutionary changes.  3. Extensive chronic small-vessel ischemic changes elsewhere throughout the hemispheric white matter. 4. ASPECTS is 10. 5.     Dg Chest Portable 1 View 06/16/2017 IMPRESSION:  Mild LEFT basilar atelectasis.    Transthoracic Echocardiogram  04/08/2017 Study Conclusions - Left ventricle: The cavity size was normal. Wall thickness was   increased in a pattern of mild LVH. There was moderate focal   basal hypertrophy of the septum. Systolic function was vigorous.   The estimated ejection fraction was in the range of 65% to 70%.   Wall motion was normal;  there were no regional wall motion   abnormalities. Doppler parameters are consistent with abnormal   left ventricular relaxation (grade 1 diastolic dysfunction). - Aortic valve: There was mild regurgitation. Impressions: - Vigorous LV systolic function; mild LVH with moderate proximal   septal thickening and narrow LVOT; mildly elevated LVOT gradient   (peak velocity 2 m/s); mild diastolic dysfunction; mild AI.   Bilateral Carotid Dopplers -not needed as she had CT angiogram of the neck  24 hour continuous EEG 06/19/17 : abnormal EEG due to the presence of the following: 1) Background delta and theta slowing, suggesting moderate to severe encephalopathy, which is nonspecific as to etiology; 2) Prominent focal polymorphic delta slowing in the left hemisphere, suggesting left hemispheric dysfunction and/or structural lesion, consistent with patient's history of stroke; 3) Frequent epileptiform discharges in the left posterior temporoparietal region, which waxed and waned and oftentimes appeared semi-periodic, concerning for intermittent focal electrographic seizures.      PHYSICAL EXAM Vitals:   06/19/17 0400 06/19/17 0500 06/19/17 0800 06/19/17 1251  BP: (!) 147/81  (!) 159/77 (!) 152/72  Pulse: 92  94 87  Resp:   15 17  Temp: 98.8 F (37.1 C)   98.5 F (36.9 C)  TempSrc:    Axillary  SpO2:   100% 100%  Weight:  160 lb 7.9 oz (72.8 kg)    Height:        General elderly Caucasian lady not in distress  Heart - Regular rate and rhythm - no murmer appreciated Lungs - Clear to auscultation anteriorly Abdomen - Soft - non tender Extremities - Distal pulses intact - no edema Skin - Warm and dry  Neurological Examination Mental Status: Eyes open.  .  following commands he midline and on the left side consistently.  Cranial Nerves: II: No blink to threat on right. PERRL. III,IV, VI: Eyes at midline, initially not deviating towards or away from visual stimuli with blank stare.   V,VII: Right facial droop. Decreased reaction to right sided stimuli.  VIII: Responds to some simple motor commands IX,X: Nonverbal. Will not open mouth XI: Head rotated preferentially to left XII: Does not protrude tongue Motor: RUE: Increased tone. Drops immediately to bed when raised. No movement to command or to noxious. 0/5 right upper extremity RLE: Increased extensor tone. Drops to bed with slight resistance against gravity. Weak ankle dorsiflexion to noxious plantar stimulation. 1/5 right lower extremity strength LUE: Elevates antigravity without drift. Not following commands for formal strength testing LLE: Elevates  antigravity without drift. Withdraws briskly to noxious.  Sensory: Decreased reaction to right sided stimuli.  Cerebellar/Gait: Unable to assess.       ASSESSMENT/PLAN Ms. Ebony Williams is a 67 y.o. female with history of hypertension, previous strokes by imaging, and renal calculi brought to the emergency department after she was found nonverbal. She did not receive IV t-PA due to recent infarcts  Stroke:  Recurrent left basal ganglia and MCA branch infarcts with known moderate left MCA stenosis. Unclear whether patient's decline in mental status is related to subacute recurrent strokes versus other treatable etiologies like infection, electrolyte imbalance or is electrographic brief seizures  Resultant  Depression, R hemiparesis   CT head - Question newly seen low-density in the left side of the pons, which could represent a pontine infarction.  MRI head - Evolving ischemia with petechial hemorrhage, laminar necrosis, and developing encephalomalacia in the left MCA and PCA parenchyma affected on 04/07/2017. Continued diffusion abnormality in the left basal ganglia and portions of the left occipital lobe.  MRA head - not performed  CTA H&N - No intracranial large or medium vessel occlusion identified.  2D Echo - 04/08/2017 - EF 65 - 70% no cardiac source of  emboli identified.  LDL - 107  HgbA1c - 6.4  VTE prophylaxis - subcutaneous heparin Diet Order           Diet heart healthy/carb modified Room service appropriate? Yes; Fluid consistency: Thin  Diet effective now          aspirin 81 mg daily and clopidogrel 75 mg daily prior to admission, now on aspirin 81 mg daily and clopidogrel 75 mg daily  Patient counseled to be compliant with her antithrombotic medications  Ongoing aggressive stroke risk factor management  Therapy recommendations:  pending  Disposition:  Pending  Hypertension  Stable, soft SBP, will D/C all BP meds for now  Permissive hypertension (OK if < 220/120) but gradually normalize in 5-7 days  Long-term BP goal normotensive  Hyperlipidemia  Lipid lowering medication PTA:  none  LDL 107, goal < 70  Current lipid lowering medication start Lipitor 40 mg daily  Continue statin at discharge   Other Stroke Risk Factors  Advanced age  ETOH use, advised to drink no more than 1 alcoholic beverage per day.  Overweight, Body mass index is 28.43 kg/m., recommend weight loss, diet and exercise as appropriate   Hx stroke/TIA   Other Active Problems  Mild leukocytosis   Plan / Recommendations   Stroke workup: awaiting - echo,    Therapy Follow Up: pending  Disposition: pending  Antiplatelet / Anticoagulation: continue aspirin 81 mg daily and Plavix 75 mg daily  Statin: Lipitor 40 mg daily started  off BP meds and monitor BP  Depression: add prozac 40mg  QHS to help with depression and muscle weakness, off effexor   Poor po intake, nutrition consult and 48H calories intake   Continue ceftriaxone for UTI  Plan discussed in details with [name] at bedside   MD Follow Up: pending  Other: pending  Further risk factor modification per primary care MD: Follow Up 2 weeks  Recommend check EEG for seizure activity, IV hydration and electrolyte replacement and treatment for UTI. On  discussion with the patient, Ebony Williams, Ebony Williams and Ebony-in-Williams at the bedside and answered questions. Discussed with Dr. Justice Rocher  Recommend Keppra  increase keppra dose to 1000 mg every 12 hourly given abnormal EEG showing left-sided  brief electrographic seizure   Hospital day #  3  Greater than 50% time during this 35 minute visit was spent on counseling and coordination of care about her decline in mental status, review of continuous EEG findings and discussion about treatment and answering questions.discuss with patient, Ebony Williams, Dr Justice Rocher and answered questions.  Antony Contras, MD Medical Director [facility] Pager: [phone] 06/19/2017 1:39 PM To contact Stroke Continuity provider, please refer to [email or web address] After hours, contact General Neurology

## 2017-06-19 NOTE — NC FL2 (Signed)
[place] LEVEL OF CARE SCREENING TOOL     IDENTIFICATION  Patient Name: Ebony Williams Birthdate: [birth year] Sex: female Admission Date (Current Location): 06/16/2017  [facility] and [place] Number:  [employer] and Address:  The . [facility], [place] [address], [place], [place] [address]      Provider Number: [id number]  Attending Physician Name and Address:  Elmarie Shiley, MD  Relative Name and Phone Number:  Reece Levy, spouse, [phone]    Current Level of Care: Domiciliary (Rest home) Recommended Level of Care: [place] Prior Approval Number:    Date Approved/Denied:   PASRR Number: [id number]  Discharge Plan: SNF    Current Diagnoses: Patient Active Problem List   Diagnosis Date Noted  . Right arm weakness 06/16/2017  . Aphasia 06/16/2017  . Spastic hemiplegia of right dominant side as late effect of cerebral infarction ([place])   . Hyponatremia   . Labile blood pressure   . Hypokalemia   . Elevated BUN   . Lethargy   . Transaminitis   . Essential hypertension   . Right hemiplegia ([place])   . Hypertensive crisis   . 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 Depression   . Dysphagia, post-stroke   . Diastolic dysfunction   . Benign essential HTN   . Prediabetes   . Leukocytosis   . Cerebral infarction due to occlusion of left middle cerebral artery ([place]) s/p IV tPA 04/06/2017  . Stroke (cerebrum) ([place]) 04/06/2017  . 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 Pain in joint, shoulder region 10/18/2011    Orientation RESPIRATION BLADDER Height & Weight     Self, Time, Situation, Place  Normal Incontinent, External catheter Weight: 72.8 kg (160 lb 7.9 oz) Height:  5\' 3"  (160 cm)  BEHAVIORAL SYMPTOMS/MOOD NEUROLOGICAL BOWEL NUTRITION STATUS      Incontinent Diet(Please see DC Summary)  AMBULATORY STATUS COMMUNICATION OF NEEDS Skin   Extensive Assist Verbally Other (Comment)(Wound on leg with foam  dressing)                       Personal Care Assistance Level of Assistance  Bathing, Feeding, Dressing Bathing Assistance: Maximum assistance Feeding assistance: Limited assistance Dressing Assistance: Maximum assistance     Functional Limitations Info  Sight, Hearing, Speech Sight Info: Adequate Hearing Info: Adequate Speech Info: Adequate    SPECIAL CARE FACTORS FREQUENCY  PT (By licensed PT), OT (By licensed OT), Speech therapy     PT Frequency: 5x/week OT Frequency: 3x/week     Speech Therapy Frequency: 3x/week      Contractures      Additional Factors Info  Code Status, Allergies, Psychotropic, Insulin Sliding Scale Code Status Info: Full Allergies Info: Sulfa Antibiotics Psychotropic Info: Prozac Insulin Sliding Scale Info: 3x daily with meals and at bedtime       Current Medications (06/19/2017):  This is the current hospital active medication list Current Facility-Administered Medications  Medication Dose Route Frequency Provider Last Rate Last Dose  . 0.9 %  sodium chloride infusion   Intravenous Continuous Regalado, Belkys A, MD 125 mL/hr at 06/19/17 1209    . acetaminophen (TYLENOL) tablet 650 mg  650 mg Oral Q6H PRN Rondel Jumbo, PA-C   650 mg at 06/19/17 8469   Or  . acetaminophen (TYLENOL) suppository 650 mg  650 mg Rectal Q6H PRN Rondel Jumbo, PA-C      . acetaminophen (TYLENOL) tablet 500 mg  500 mg Oral TID Regalado, Belkys A, MD   500 mg at 06/19/17 1211  . amantadine (SYMMETREL) capsule 100 mg  100 mg Oral BID Regalado, Belkys A, MD   100 mg at 06/19/17 1241  . aspirin chewable tablet 81 mg  81 mg Oral Daily Rondel Jumbo, PA-C   81 mg at 06/19/17 1016  . atorvastatin (LIPITOR) tablet 40 mg  40 mg Oral q1800 Rinehuls, David L, PA-C   40 mg at 06/18/17 1757  . bisacodyl (DULCOLAX) suppository 10 mg  10 mg Rectal Daily PRN Sharene Butters E, PA-C      . cefTRIAXone (ROCEPHIN) 1 g in sodium chloride 0.9 % 100 mL IVPB  1 g Intravenous Q24H  Regalado, Belkys A, MD   Stopped at 06/19/17 1148  . clopidogrel (PLAVIX) tablet 75 mg  75 mg Oral Daily Rondel Jumbo, PA-C   75 mg at 06/19/17 1016  . FLUoxetine (PROZAC) capsule 40 mg  40 mg Oral QHS Arnaldo Natal, MD   40 mg at 06/18/17 2256  . heparin injection 5,000 Units  5,000 Units Subcutaneous Q8H Purohit, Shrey C, MD   5,000 Units at 06/19/17 0510  . hydrALAZINE (APRESOLINE) injection 10 mg  10 mg Intravenous Q6H PRN Bodenheimer, Charles A, NP      . insulin aspart (novoLOG) injection 0-5 Units  0-5 Units Subcutaneous QHS Regalado, Belkys A, MD      . insulin aspart (novoLOG) injection 0-9 Units  0-9 Units Subcutaneous TID WC Regalado, Belkys A, MD   1 Units at 06/19/17 1209  . levETIRAcetam (KEPPRA) IVPB 500 mg/100 mL premix  500 mg Intravenous Q12H Burnetta Sabin L, NP 400 mL/hr at 06/19/17 1241 500 mg at 06/19/17 1241  . ondansetron (ZOFRAN) tablet 4 mg  4 mg Oral Q6H PRN Rondel Jumbo, PA-C       Or  . ondansetron (ZOFRAN) injection 4 mg  4 mg Intravenous Q6H PRN Rondel Jumbo, PA-C      . pantoprazole (PROTONIX) EC tablet 40 mg  40 mg Oral Daily Sharene Butters E, PA-C   40 mg at 06/19/17 1016  . senna-docusate (Senokot-S) tablet 1 tablet  1 tablet Oral QHS PRN Rondel Jumbo, PA-C         Discharge Medications: Please see discharge summary for a list of discharge medications.  Relevant Imaging Results:  Relevant Lab Results:   Additional Information SSN: [id number]  Benard Halsted, LCSWA

## 2017-06-20 ENCOUNTER — Inpatient Hospital Stay (HOSPITAL_COMMUNITY): Payer: Medicare Other

## 2017-06-20 DIAGNOSIS — R4182 Altered mental status, unspecified: Secondary | ICD-10-CM

## 2017-06-20 LAB — CBC
HCT: 40.7 % (ref 36.0–46.0)
Hemoglobin: 14.1 g/dL (ref 12.0–15.0)
MCH: 30.7 pg (ref 26.0–34.0)
MCHC: 34.6 g/dL (ref 30.0–36.0)
MCV: 88.5 fL (ref 78.0–100.0)
Platelets: 255 10*3/uL (ref 150–400)
RBC: 4.6 MIL/uL (ref 3.87–5.11)
RDW: 12.8 % (ref 11.5–15.5)
WBC: 6.4 10*3/uL (ref 4.0–10.5)

## 2017-06-20 LAB — GLUCOSE, CAPILLARY
Glucose-Capillary: 125 mg/dL — ABNORMAL HIGH (ref 65–99)
Glucose-Capillary: 252 mg/dL — ABNORMAL HIGH (ref 65–99)
Glucose-Capillary: 120 mg/dL — ABNORMAL HIGH (ref 65–99)
Glucose-Capillary: 148 mg/dL — ABNORMAL HIGH (ref 65–99)

## 2017-06-20 LAB — BASIC METABOLIC PANEL
ANION GAP: 11 (ref 5–15)
BUN: <5 mg/dL — ABNORMAL LOW (ref 6–20)
CO2: 23 mmol/L (ref 22–32)
CREATININE: 0.46 mg/dL (ref 0.44–1.00)
Calcium: 8.9 mg/dL (ref 8.9–10.3)
Chloride: 106 mmol/L (ref 101–111)
Glucose, Bld: 113 mg/dL — ABNORMAL HIGH (ref 65–99)
Potassium: 2.8 mmol/L — ABNORMAL LOW (ref 3.5–5.1)
SODIUM: 140 mmol/L (ref 135–145)

## 2017-06-20 MED ORDER — POTASSIUM CHLORIDE CRYS ER 20 MEQ PO TBCR
40.0000 meq | EXTENDED_RELEASE_TABLET | Freq: Two times a day (BID) | ORAL | Status: AC
  Administered 2017-06-20 (×2): 40 meq via ORAL
  Filled 2017-06-20 (×2): qty 2

## 2017-06-20 MED ORDER — LEVETIRACETAM 500 MG PO TABS
1000.0000 mg | ORAL_TABLET | Freq: Two times a day (BID) | ORAL | Status: DC
  Administered 2017-06-20 – 2017-06-22 (×5): 1000 mg via ORAL
  Filled 2017-06-20 (×5): qty 2

## 2017-06-20 MED ORDER — CEPHALEXIN 125 MG/5ML PO SUSR
500.0000 mg | Freq: Two times a day (BID) | ORAL | Status: DC
  Administered 2017-06-20 – 2017-06-21 (×3): 500 mg via ORAL
  Filled 2017-06-20 (×5): qty 20

## 2017-06-20 MED ORDER — BOOST / RESOURCE BREEZE PO LIQD CUSTOM
1.0000 | Freq: Three times a day (TID) | ORAL | Status: DC
  Administered 2017-06-20 – 2017-06-22 (×4): 1 via ORAL

## 2017-06-20 MED ORDER — MAGNESIUM HYDROXIDE 400 MG/5ML PO SUSP: 5.0000 mL | Freq: Every day | ORAL | Status: DC | PRN

## 2017-06-20 MED ORDER — SENNOSIDES-DOCUSATE SODIUM 8.6-50 MG PO TABS
2.0000 | ORAL_TABLET | Freq: Every day | ORAL | Status: DC
  Administered 2017-06-20: 2 via ORAL
  Filled 2017-06-20 (×2): qty 2

## 2017-06-20 NOTE — Progress Notes (Signed)
Physical Therapy Treatment Patient Details Name: Ebony Williams MRN: [id number] DOB: 1951/01/24 Today's Date: 06/20/2017    History of Present Illness Pt is a 67 y.o. female with recent UTI admitted 06/16/17 with AMS. Of note, admitted 04/06/17 with L MCA and PCA parenchyma infarcts with discharge to CIR then SNF. MRI 5/11 shows no new intracrnial abnormality; continued diffusion abnormality in L basal ganglia and L occipital lobe. PMH includes HTN.    PT Comments    Pt progressing slowly.  Emphasis today on R side ROM, stretching of R heelcord, positioning in the PRAFO and rolling with peri care.  Limited to bed activity due to time spent helping pt get clean.   Follow Up Recommendations  SNF;Supervision/Assistance - 24 hour     Equipment Recommendations  Other (comment)(TBA)    Recommendations for Other Services       Precautions / Restrictions Precautions Precautions: Fall    Mobility  Bed Mobility Overal bed mobility: Needs Assistance Bed Mobility: Rolling Rolling: Mod assist         General bed mobility comments: Pt log rolling R and L for peri care . pt incontinent and unaware  Transfers                 General transfer comment: not attempted due to so much time spent on peri care.  Ambulation/Gait                 Stairs             Wheelchair Mobility    Modified Rankin (Stroke Patients Only) Modified Rankin (Stroke Patients Only) Pre-Morbid Rankin Score: Moderately severe disability Modified Rankin: Moderately severe disability     Balance                                            Cognition Arousal/Alertness: Awake/alert Behavior During Therapy: Flat affect Overall Cognitive Status: History of cognitive impairments - at baseline                                        Exercises      General Comments General comments (skin integrity, edema, etc.): noted to have redness in peri care and  daughter reports previous UTI and yeast infection. pt with muscus looking stool duaghter describes as "JELLY"       Pertinent Vitals/Pain Pain Assessment: Faces Faces Pain Scale: Hurts whole lot Pain Location: R UE and R LE  Pain Descriptors / Indicators: Discomfort;Grimacing;Moaning Pain Intervention(s): Monitored during session;Repositioned    Home Living Family/patient expects to be discharged to:: Skilled nursing facility   Available Help at Discharge: Family;Available 24 hours/day           Additional Comments: Daughter reporting that pt was at [facility] for rehab and plans on returning to SNF for further rehab. She is unsure if she wants to return to [facility]    Prior Function            PT Goals (current goals can now be found in the care plan section) Acute Rehab PT Goals Patient Stated Goal: Return to rehab PT Goal Formulation: With patient/family Time For Goal Achievement: 07/01/17 Potential to Achieve Goals: Good Progress towards PT goals: Progressing toward goals    Frequency  Min 3X/week      PT Plan Current plan remains appropriate    Co-evaluation PT/OT/SLP Co-Evaluation/Treatment: Yes Reason for Co-Treatment: Complexity of the patient's impairments (multi-system involvement) PT goals addressed during session: Mobility/safety with mobility OT goals addressed during session: ADL's and self-care;Proper use of Adaptive equipment and DME;Strengthening/ROM      AM-PAC PT "6 Clicks" Daily Activity  Outcome Measure  Difficulty turning over in bed (including adjusting bedclothes, sheets and blankets)?: Unable Difficulty moving from lying on back to sitting on the side of the bed? : Unable Difficulty sitting down on and standing up from a chair with arms (e.g., wheelchair, bedside commode, etc,.)?: Unable Help needed moving to and from a bed to chair (including a wheelchair)?: A Lot Help needed walking in hospital room?: A Lot Help needed  climbing 3-5 steps with a railing? : A Lot 6 Click Score: 9    End of Session   Activity Tolerance: Patient tolerated treatment well Patient left: in bed;with call bell/phone within reach;with family/visitor present;Other (comment)(limited to bed mobility today) Nurse Communication: Mobility status PT Visit Diagnosis: Other abnormalities of gait and mobility (R26.89);Other symptoms and signs involving the nervous system (R29.898);Hemiplegia and hemiparesis Hemiplegia - Right/Left: Right Hemiplegia - dominant/non-dominant: Dominant     Time: 0786-7544 PT Time Calculation (min) (ACUTE ONLY): 33 min  Charges:  $Therapeutic Activity: 8-22 mins                    G Codes:       07/08/17  Donnella Sham, PT [phone]  (pager)   Tessie Fass Eldrick Penick 2017-07-08, 3:55 PM

## 2017-06-20 NOTE — Progress Notes (Signed)
STROKE TEAM PROGRESS NOTE       SUBJECTIVE (INTERVAL HISTORY) Her daughter , and husband are  at bedside, Pt is  More alert and interactive today and t speaking a lbit. She was transferred to the ICU yesterday evening as medical hospitalist felt she was not awake enough to be managed on the floor. She however appears to have improved since transfer to the ICU.[name]  No definite seizure activity noted. I spoke to Dr. Leonel Ramsay who also personally reviewed the 24-hour EEG recording and felt it did not suggest status epilepticus but only transient short lasting electrographic seizures without clinical correlation of unclear significance OBJECTIVE Temp:  [97.5 F (36.4 C)-98.3 F (36.8 C)] 98.3 F (36.8 C) (05/15 1200) Pulse Rate:  [71-88] 80 (05/15 1000) Cardiac Rhythm: Normal sinus rhythm (05/15 0800) Resp:  [15-29] 15 (05/15 1000) BP: (135-163)/(66-83) 147/70 (05/15 1000) SpO2:  [98 %-100 %] 99 % (05/15 1000) Weight:  [160 lb 7.9 oz (72.8 kg)] 160 lb 7.9 oz (72.8 kg) (05/15 0347)  CBC:  Recent Labs  Lab 06/16/17 1330  06/19/17 0616 06/20/17 0357  WBC 11.9*   < > 8.4 6.4  NEUTROABS 7.8*  --   --   --   HGB 16.3*   < > 15.4* 14.1  HCT 45.4   < > 43.5 40.7  MCV 89.2   < > 88.8 88.5  PLT 347   < > 265 255   < > = values in this interval not displayed.    Basic Metabolic Panel:  Recent Labs  Lab 06/16/17 1848  06/19/17 0616 06/20/17 0217  NA  --    < > 138 140  K  --    < > 4.2 2.8*  CL  --    < > 103 106  CO2  --    < > 22 23  GLUCOSE  --    < > 121* 113*  BUN  --    < > 6 <5*  CREATININE  --    < > 0.49 0.46  CALCIUM  --    < > 8.7* 8.9  MG 2.0  --   --   --    < > = values in this interval not displayed.    Lipid Panel:     Component Value Date/Time   CHOL 159 04/07/2017 0226   TRIG 98 04/07/2017 0226   HDL 32 (L) 04/07/2017 0226   CHOLHDL 5.0 04/07/2017 0226   VLDL 20 04/07/2017 0226   LDLCALC 107 (H) 04/07/2017 0226   HgbA1c:  Lab Results  Component Value  Date   HGBA1C 6.4 (H) 04/07/2017   Urine Drug Screen:     Component Value Date/Time   LABOPIA NONE DETECTED 06/16/2017 1645   COCAINSCRNUR NONE DETECTED 06/16/2017 1645   LABBENZ POSITIVE (A) 06/16/2017 1645   AMPHETMU NONE DETECTED 06/16/2017 1645   THCU NONE DETECTED 06/16/2017 1645   LABBARB NONE DETECTED 06/16/2017 1645    Alcohol Level     Component Value Date/Time   ETH <10 04/06/2017 1714    IMAGING  Ct Angio Head W Or Wo Contrast Ct Angio Neck W Or Wo Contrast 06/16/2017 IMPRESSION: No intracranial large or medium vessel occlusion identified. Atherosclerotic tortuosity of the aorta. 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 Minimal atherosclerotic change at the left carotid bifurcation but no stenosis.    Mr Jeri Cos And Wo Contrast 06/16/2017 IMPRESSION:  1. Evolving ischemia with  petechial hemorrhage, laminar necrosis, and developing encephalomalacia in the left MCA and PCA parenchyma affected on 04/07/2017. Continued diffusion abnormality in the left basal ganglia and portions of the left occipital lobe. Post ischemic enhancement.  2. Underlying advanced chronic small vessel disease suspected. Pronounced chronic appearing signal changes in the medial thalami and pons.  3. No new intracranial ischemia or new intracranial abnormality identified.    Ct Head Code Stroke Wo Contrast 06/16/2017 IMPRESSION:  1. Question newly seen low-density in the left side of the pons, which could represent a pontine infarction.  2. Late subacute infarctions in the left occipital lobe and in the left basal ganglia and deep white matter show expected evolutionary changes.  3. Extensive chronic small-vessel ischemic changes elsewhere throughout the hemispheric white matter. 4. ASPECTS is 10. 5.     Dg Chest Portable 1 View 06/16/2017 IMPRESSION:  Mild LEFT basilar atelectasis.    Transthoracic Echocardiogram  04/08/2017 Study Conclusions -  Left ventricle: The cavity size was normal. Wall thickness was   increased in a pattern of mild LVH. There was moderate focal   basal hypertrophy of the septum. Systolic function was vigorous.   The estimated ejection fraction was in the range of 65% to 70%.   Wall motion was normal; there were no regional wall motion   abnormalities. Doppler parameters are consistent with abnormal   left ventricular relaxation (grade 1 diastolic dysfunction). - Aortic valve: There was mild regurgitation. Impressions: - Vigorous LV systolic function; mild LVH with moderate proximal   septal thickening and narrow LVOT; mildly elevated LVOT gradient   (peak velocity 2 m/s); mild diastolic dysfunction; mild AI.   Bilateral Carotid Dopplers -not needed as she had CT angiogram of the neck  24 hour continuous EEG 06/19/17 : abnormal EEG due to the presence of the following: 1) Background delta and theta slowing, suggesting moderate to severe encephalopathy, which is nonspecific as to etiology; 2) Prominent focal polymorphic delta slowing in the left hemisphere, suggesting left hemispheric dysfunction and/or structural lesion, consistent with patient's history of stroke; 3) Frequent epileptiform discharges in the left posterior temporoparietal region, which waxed and waned and oftentimes appeared semi-periodic, concerning for intermittent focal electrographic seizures.      PHYSICAL EXAM Vitals:   06/20/17 0800 06/20/17 0900 06/20/17 1000 06/20/17 1200  BP: (!) 163/81 (!) 150/72 (!) 147/70   Pulse: 78 71 80   Resp: 17 16 15    Temp: (!) 97.5 F (36.4 C)   98.3 F (36.8 C)  TempSrc: Oral   Oral  SpO2: 100% 99% 99%   Weight:      Height:        General elderly Caucasian lady not in distress  Heart - Regular rate and rhythm - no murmer appreciated Lungs - Clear to auscultation anteriorly Abdomen - Soft - non tender Extremities - Distal pulses intact - no edema Skin - Warm and dry  Neurological  Examination Mental Status: Awake with eyes open.  .  following commands he midline and on the left side consistently.  Cranial Nerves: II: No blink to threat on right. PERRL. III,IV, VI: Eyes at midline, initially not deviating towards or away from visual stimuli with blank stare.  V,VII: Right facial droop. Decreased reaction to right sided stimuli.  VIII: Responds to  simple motor commands on the left side only IX,X:able to talk a little and swallow XI: Head rotated preferentially to left XII: Does not protrude tongue Motor: RUE: Increased tone. Drops immediately to  bed when raised. No movement to command or to noxious. 0/5 right upper extremity RLE: Increased extensor tone. Drops to bed with slight resistance against gravity. Weak ankle dorsiflexion to noxious plantar stimulation. 1/5 right lower extremity strength LUE: Elevates antigravity without drift.   LLE: Elevates antigravity without drift. Withdraws briskly to noxious.  Sensory: Decreased reaction to right sided stimuli.  Cerebellar/Gait: Unable to assess.       ASSESSMENT/PLAN Ms. SHAKERRA RED is a 67 y.o. female with history of hypertension, previous strokes by imaging, and renal calculi brought to the emergency department after she was found nonverbal. She did not receive IV t-PA due to recent infarcts  Stroke:  Recurrent left basal ganglia and MCA branch infarcts with known moderate left MCA stenosis. Unclear whether patient's decline in mental status is related to subacute recurrent strokes versus other treatable etiologies like infection, electrolyte imbalance or   electrographic brief seizures  Resultant  Depression, R hemiparesis   CT head - Question newly seen low-density in the left side of the pons, which could represent a pontine infarction.  MRI head - Evolving ischemia with petechial hemorrhage, laminar necrosis, and developing encephalomalacia in the left MCA and PCA parenchyma affected on 04/07/2017.  Continued diffusion abnormality in the left basal ganglia and portions of the left occipital lobe.  MRA head - not performed  CTA H&N - No intracranial large or medium vessel occlusion identified.  2D Echo - 04/08/2017 - EF 65 - 70% no cardiac source of emboli identified.  LDL - 107  HgbA1c - 6.4  VTE prophylaxis - subcutaneous heparin Diet Order           Diet heart healthy/carb modified Room service appropriate? Yes; Fluid consistency: Thin  Diet effective now          aspirin 81 mg daily and clopidogrel 75 mg daily prior to admission, now on aspirin 81 mg daily and clopidogrel 75 mg daily  Patient counseled to be compliant with her antithrombotic medications  Ongoing aggressive stroke risk factor management  Therapy recommendations:  pending  Disposition:  Pending  Hypertension  Stable, soft SBP, will D/C all BP meds for now  Permissive hypertension (OK if < 220/120) but gradually normalize in 5-7 days  Long-term BP goal normotensive  Hyperlipidemia  Lipid lowering medication PTA:  none  LDL 107, goal < 70  Current lipid lowering medication start Lipitor 40 mg daily  Continue statin at discharge   Other Stroke Risk Factors  Advanced age  ETOH use, advised to drink no more than 1 alcoholic beverage per day.  Overweight, Body mass index is 28.43 kg/m., recommend weight loss, diet and exercise as appropriate   Hx stroke/TIA   Other Active Problems  Mild leukocytosis   Plan / Recommendations    Therapy Follow Up: pending  Disposition: pending  Antiplatelet / Anticoagulation: continue aspirin 81 mg daily and Plavix 75 mg daily  Statin: Lipitor 40 mg daily started  off BP meds and monitor BP  Depression: add prozac 40mg  QHS to help with depression and muscle weakness, off effexor   Poor po intake, nutrition consult and 48H calories intake   Continue keflex for UTI  Plan discussed in details with her daughter and husband  at bedside    Further risk factor modification per primary care MD: Follow Up 2 weeks  Recommend  y, IV hydration and electrolyte replacement and treatment for UTI. On discussion with the patient, daughter,husband at the bedside and answered questions. Discussed  with Dr. Lynetta Mare  Continue Keppra    1000 mg every 12 hourly given abnormal as EEG showed  left-sided  brief electrographic seizure   Hospital day # 4  Greater than 50% time during this 35 minute visit was spent on counseling and coordination of care about her decline in mental status, review of continuous EEG findings and discussion about treatment and answering questions.discuss with patient, daughter, Dr Lynetta Mare and answered questions.  Antony Contras, MD Medical Director [facility] Pager: [phone] 06/20/2017 12:51 PM To contact Stroke Continuity provider, please refer to [email or web address] After hours, contact General Neurology

## 2017-06-20 NOTE — Progress Notes (Signed)
PROGRESS NOTE  Ebony Williams  HUT:[id number] DOB: 21-Jul-1950 DOA: 06/16/2017 PCP: Sharilyn Sites, MD   Brief Narrative: Ebony [place] a 67 y.o.femalewith medical history significant forUTI 3 weeks ago, with perineal yeast infection 1 week ago, presenting today with altered mental status and FACA. The patient was independent prior to sustaining a stroke in April 06, 2017, at the time suffering right sided weakness, left gaze and aphasia. She was treated with TPA and discharged home on aspirin and Plavix. The patient has been at the rehab center.Until 10 AM today she was seen her usual state of health, however her daughter came to visit around 39 and found her to be confused. She was not responding to her questions. The patient of note, had a fall yesterday, not hitting her head, but today did not ambulate, was very tearful at the emergency department, and was nonconversant.. Stroke was called at 1313, EMS arrived at 1320 4 PM, NIH SS on arrival was 14,more responsive from reported. CT of the head was negative for acute findings.   Admitted with encephalopathy, AMS. Found to have evolving stroke. Also treating for UTI and possible seizure.   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 May benefit from return to CIR  Acute metabolic encephalopathy: Multifactorial in pt with diminished cerebral reserve due to age and CVD/CVAs.  - Avoid sedating medications, avoid rapid changes in psychotropic medications - Delirium precautions - Complete Tx for UTI and continue AEDs for seizures - Level of alertness improved, transfer to floor  today  Seizure disorder: Due to encephalomalacia related to strokes - Continue increased keppra per neurology recommendations.  - Seizure precautions  Depression, anxiety, probably adjustment disorder:  - Continue SSRI - Holding xanax  Klebsiella UTI:  - Convert to keflex today.   Hypokalemia:  - Replace today, recheck in AM with mag. Can take po.   Right hemiparesis with rightshoulder pain: Reportedly following a fall out of wheelchair. No bony abnormality on exam.  - R/o with XR.  - Continue PT - RUE contractures developing, would like OT to evaluate for splint sizing.   HTN:  - Permissive HTN (hold norvasc, ARB, chlorthalidone)  GERD:  - Continue PPI  HLD:  - Continue statin   DVT prophylaxis: Heparin Code Status: Full Family Communication: Husband, Brother, Sister and SIL at bedside Disposition Plan: Transfer to floor. Continue PT and follow recommendations.   Consultants:   Neurology, Sela Hua, Agarwal  Procedures:  24 hour continuous EEG 06/19/17 : abnormal EEG due to the presence of the following: 1) Background delta and theta slowing, suggesting moderate to severe encephalopathy, which is nonspecific as to etiology; 2) Prominent focal polymorphic delta slowing in the left hemisphere, suggesting left hemispheric dysfunction and/or structural lesion, consistent with patient's history of stroke; 3) Frequent epileptiform discharges in the left posterior temporoparietal region,whichwaxed and waned andoftentimes appeared semi-periodic, concerning for intermittent focal electrographic seizures.  Antimicrobials:  Ceftriaxone 5/12 - 5/15  Keflex 5/15 -  5/18  Subjective: Gives one word answers, denies pain. Alert. Family happy with improvement in level of alertness. No new numbness or weakness.  Objective: Vitals:   06/20/17 0700 06/20/17 0800 06/20/17 0900 06/20/17 1000  BP: (!) 151/79 (!) 163/81 (!) 150/72 (!) 147/70  Pulse: 74 78 71 80  Resp: 16 17 16  15   Temp:  (!) 97.5 F (36.4 C)    TempSrc:  Oral    SpO2: 100% 100% 99% 99%  Weight:      Height:        Intake/Output Summary (Last 24 hours) at 06/20/2017 1207 Last data filed at 06/20/2017 1000 Gross per 24 hour  Intake 3346.25 ml  Output 1700 ml  Net 1646.25 ml   Filed Weights   06/18/17 0557 06/19/17 0500 06/20/17 0347  Weight: 71.3 kg (157 lb 3 oz) 72.8 kg (160 lb 7.9 oz) 72.8 kg (160 lb 7.9 oz)    Gen: 67 y.o. female in no distress Pulm: Non-labored breathing with supplemental oxygen. Clear to auscultation bilaterally.  CV: Regular rate and rhythm. No murmur, rub, or gallop. No JVD GI: Abdomen soft, non-tender, non-distended, with normoactive bowel sounds. No organomegaly or masses felt. Ext: Spastic right hemiparesis. No tenderness to palpation right shoulder. 1+ pitting edema to right foot and hand.  Skin: No rashes, lesions no ulcers Neuro: Alert and oriented. Hemiparesis, stable. Psych: Judgement and insight appear normal. Mood & affect appropriate.   Data Reviewed: I have personally reviewed following labs and imaging studies  CBC: Recent Labs  Lab 06/16/17 1330 06/16/17 1453 06/17/17 0515 06/18/17 0754 06/19/17 0616 06/20/17 0357  WBC 11.9*  --  10.7* 6.8 8.4 6.4  NEUTROABS 7.8*  --   --   --   --   --   HGB 16.3* 15.3* 15.1* 14.8 15.4* 14.1  HCT 45.4 45.0 43.9 42.7 43.5 40.7  MCV 89.2  --  91.1 90.1 88.8 88.5  PLT 347  --  289 253 265 161   Basic Metabolic Panel: Recent Labs  Lab 06/16/17 1459 06/16/17 1848 06/17/17 0515 06/18/17 0754 06/19/17 0616 06/20/17 0217  NA 134*  --  137 139 138 140  K 3.3*  --  3.7 3.1* 4.2 2.8*  CL 99*  --  101 104 103 106  CO2 23  --  23 25 22 23   GLUCOSE 128*  --  121* 125* 121* 113*  BUN 15  --  13 6 6  <5*  CREATININE 0.61  --  0.58 0.51 0.49 0.46  CALCIUM 9.1  --  9.3 9.0 8.7* 8.9  MG  --  2.0  --   --   --   --    GFR: Estimated Creatinine Clearance: 63.5 mL/min (by C-G formula based on SCr of 0.46  mg/dL). Liver Function Tests: Recent Labs  Lab 06/16/17 1459  AST 29  ALT 26  ALKPHOS 82  BILITOT 1.2  PROT 6.8  ALBUMIN 3.6   No results for input(s): LIPASE, AMYLASE in the last 168 hours. No results for input(s): AMMONIA in the last 168 hours. Coagulation Profile: Recent Labs  Lab 06/16/17 1330  INR 1.12   Cardiac Enzymes: No results for input(s): CKTOTAL, CKMB, CKMBINDEX, TROPONINI in the last 168 hours. BNP (last 3 results) No results for input(s): PROBNP in the last 8760 hours. HbA1C: No results for input(s): HGBA1C in the last 72 hours. CBG: Recent Labs  Lab 06/19/17 0647 06/19/17 1146 06/19/17 1707 06/19/17 2116 06/20/17 0821  GLUCAP 124* 147* 131* 114* 125*   Lipid Profile: No results for input(s): CHOL, HDL, LDLCALC, TRIG, CHOLHDL, LDLDIRECT in the last 72 hours. Thyroid Function  Tests: Recent Labs    06/18/17 1050  TSH 0.941   Anemia Panel: No results for input(s): VITAMINB12, FOLATE, FERRITIN, TIBC, IRON, RETICCTPCT in the last 72 hours. Urine analysis:    Component Value Date/Time   COLORURINE YELLOW 06/16/2017 [place] 06/16/2017 1508   LABSPEC >1.046 (H) 06/16/2017 1508   PHURINE 6.0 06/16/2017 1508   GLUCOSEU NEGATIVE 06/16/2017 1508   HGBUR NEGATIVE 06/16/2017 1508   BILIRUBINUR NEGATIVE 06/16/2017 1508   KETONESUR 5 (A) 06/16/2017 1508   PROTEINUR NEGATIVE 06/16/2017 1508   NITRITE NEGATIVE 06/16/2017 1508   LEUKOCYTESUR SMALL (A) 06/16/2017 1508   Recent Results (from the past 240 hour(s))  Urine culture     Status: Abnormal   Collection Time: 06/16/17  4:45 PM  Result Value Ref Range Status   Specimen Description URINE, RANDOM  Final   Special Requests   Final    NONE Performed at [facility], [place] [address] [place], [place] [address]    Culture >=100,000 COLONIES/mL KLEBSIELLA PNEUMONIAE (A)  Final   Report Status 06/19/2017 FINAL  Final   Organism ID, Bacteria KLEBSIELLA PNEUMONIAE (A)  Final       Susceptibility   Klebsiella pneumoniae - MIC*    AMPICILLIN RESISTANT Resistant     CEFAZOLIN <=4 SENSITIVE Sensitive     CEFTRIAXONE <=1 SENSITIVE Sensitive     CIPROFLOXACIN <=0.25 SENSITIVE Sensitive     GENTAMICIN <=1 SENSITIVE Sensitive     IMIPENEM <=0.25 SENSITIVE Sensitive     NITROFURANTOIN 64 INTERMEDIATE Intermediate     TRIMETH/SULFA <=20 SENSITIVE Sensitive     AMPICILLIN/SULBACTAM 4 SENSITIVE Sensitive     PIP/TAZO <=4 SENSITIVE Sensitive     Extended ESBL NEGATIVE Sensitive     * >=100,000 COLONIES/mL KLEBSIELLA PNEUMONIAE  MRSA PCR Screening     Status: None   Collection Time: 06/16/17  6:51 PM  Result Value Ref Range Status   MRSA by PCR NEGATIVE NEGATIVE Final    Comment:        The GeneXpert MRSA Assay (FDA approved for NASAL specimens only), is one component of a comprehensive MRSA colonization surveillance program. It is not intended to diagnose MRSA infection nor to guide or monitor treatment for MRSA infections. Performed at [facility], [place] [address] [place], [place] [address]       Radiology Studies: No results found.  Scheduled Meds: . acetaminophen  500 mg Oral TID  . amantadine  100 mg Oral BID  . aspirin  81 mg Oral Daily  . atorvastatin  40 mg Oral q1800  . cephALEXin  500 mg Oral Q12H  . clopidogrel  75 mg Oral Daily  . FLUoxetine  40 mg Oral QHS  . heparin  5,000 Units Subcutaneous Q8H  . insulin aspart  0-5 Units Subcutaneous QHS  . insulin aspart  0-9 Units Subcutaneous TID WC  . levETIRAcetam  1,000 mg Oral BID  . pantoprazole  40 mg Oral Daily  . potassium chloride  40 mEq Oral BID  . senna-docusate  2 tablet Oral QHS   Continuous Infusions:   LOS: 4 days   Time spent: 25 minutes.  Patrecia Pour, MD [facility] Pager [phone]  If 7PM-7AM, please contact night-coverage [email or web address] Password [facility] 06/20/2017, 12:07 PM

## 2017-06-20 NOTE — Progress Notes (Signed)
Nutrition Follow-up  DOCUMENTATION CODES:   Not applicable  INTERVENTION:   Boost Breeze po TID (pt's meal intake is poor), each supplement provides 250 kcal and 9 grams of protein  NUTRITION DIAGNOSIS:   Inadequate oral intake related to lethargy/confusion, acute illness as evidenced by meal completion < 25%. Ongoing  GOAL:   Patient will meet greater than or equal to 90% of their needs Progressing.   MONITOR:   PO intake, Supplement acceptance, Labs, Weight trends, Skin, I & O's  ASSESSMENT:   Ebony Williams is a 67 y.o. female with medical history significant for UTI 3 weeks ago, with perineal yeast infection 1 week ago, presenting today with altered mental status and FACA.  Pt discussed during ICU rounds and with RN.   Met with daughter and pt (who was working with SLP during visit). Per daughter she has been feeding her mom and it takes a lot of cues and she chops her meats and adds gravy to make it easier for her to swallow.  Pt does not like ensure but has tried boost breeze and likes them. Will add and monitor intake, instructed daughter that she should drink them and as her intake improves we can decrease the amount of oral nutrition supplements she consumes.   Daughter concerned about constipation, noted bm yesterday  Labs reviewed: 2.8 (L) CBG (last 3)  Recent Labs    06/20/17 0821 06/20/17 1216 06/20/17 1644  GLUCAP 125* 252* 120*     Diet Order:   Diet Order           Diet heart healthy/carb modified Room service appropriate? Yes; Fluid consistency: Thin  Diet effective now          EDUCATION NEEDS:   Not appropriate for education at this time  Skin:  Skin Assessment: Skin Integrity Issues: Skin Integrity Issues:: Incisions Incisions: rt lower leg  Last BM:  5/14 medium  Height:   Ht Readings from Last 1 Encounters:  06/16/17 '5\' 3"'$  (1.6 m)    Weight:   Wt Readings from Last 1 Encounters:  06/20/17 160 lb 7.9 oz (72.8 kg)     Ideal Body Weight:  52.3 kg  BMI:  Body mass index is 28.43 kg/m.  Estimated Nutritional Needs:   Kcal:  1700-1900  Protein:  85-100 grams  Fluid:  1.7-1.9 L  Maylon Peppers RD, LDN, CNSC [phone] Pager [phone] After Hours Pager

## 2017-06-20 NOTE — Progress Notes (Signed)
PULMONARY / CRITICAL CARE MEDICINE   Name: Ebony Williams MRN: [id number] DOB: [birth year]    ADMISSION DATE:  06/16/2017 CONSULTATION DATE:  06/19/2017  REFERRING MD:  Erin Sons.  CHIEF COMPLAINT:  Altered mental status.  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   Had progressed to rehabilitation where she experienced as subacute decline in mental status over the past week with confusion and increased lethargy.   Treated for UTI and possible seizure seen on EEG as potential cause for confusion.  MRI concerning for increased ischemic core vs delayed evolution of stroke.   Speaking to daughter, she reports constipation treated with Miralax.  Antidepressants increased.   PAST MEDICAL HISTORY :  She  has a past medical history of History of kidney stones and Hypertension.  PAST SURGICAL HISTORY: She  has a past surgical history that includes Cholecystectomy; Tubal ligation; and Colonoscopy with propofol (N/A, 01/12/2015).  Allergies  Allergen Reactions  . Sulfa Antibiotics Diarrhea and Itching    No current facility-administered medications on file prior to encounter.    Current Outpatient Medications on File Prior to Encounter  Medication Sig  . ALPRAZolam (XANAX) 0.25 MG tablet Take 0.25 mg by mouth at bedtime.  [name] amLODipine (NORVASC) 10 MG tablet Take 1 tablet (10 mg total) by mouth daily.  [name] aspirin 81 MG chewable tablet Chew 1 tablet (81 mg total) by mouth daily.  . chlorthalidone (HYGROTON) 25 MG tablet Take 1 tablet (25 mg total) by mouth daily.  . clopidogrel (PLAVIX) 75 MG tablet Take 1 tablet (75 mg total) by mouth daily. (Patient taking differently: Take 75 mg by mouth every evening. )  . irbesartan (AVAPRO) 300 MG tablet Take 1 tablet (300 mg total) by mouth daily.  . metFORMIN (GLUCOPHAGE) 500 MG tablet Take 1 tablet (500 mg total) by mouth daily with breakfast.  . nystatin (NYSTATIN) powder Apply topically See admin instructions.  Apply topically to groin and outer vaginal area daily after bath and twice daily as needed for rash/irritation  . ondansetron (ZOFRAN) 4 MG tablet Take 4 mg by mouth every 8 (eight) hours as needed for nausea or vomiting.  . pantoprazole (PROTONIX) 40 MG tablet Take 1 tablet (40 mg total) by mouth daily.  . polyethylene glycol (MIRALAX / GLYCOLAX) packet Take 17 g by mouth daily. Mix with 8-10 oz water and drink  . PRESCRIPTION MEDICATION Inhale into the lungs at bedtime. CPAP  . tiZANidine (ZANAFLEX) 2 MG tablet Take 2 mg by mouth 2 (two) times daily.   [name] venlafaxine XR (EFFEXOR-XR) 75 MG 24 hr capsule Take 75 mg by mouth every evening.   [name] ALPRAZolam (XANAX) 0.5 MG tablet Take 1 tablet (0.5 mg total) by mouth 3 (three) times daily as needed for anxiety or sleep. (Patient not taking: Reported on 06/16/2017)  . amantadine (SYMMETREL) 100 MG capsule Take 2 capsules (200 mg total) by mouth 2 (two) times daily. (Patient not taking: Reported on 06/16/2017)  . atorvastatin (LIPITOR) 10 MG tablet Take 1 tablet (10 mg total) by mouth daily at 6 PM. (Patient not taking: Reported on 06/16/2017)    FAMILY HISTORY:  Her indicated that her mother is alive. She indicated that her father is alive.   SOCIAL HISTORY: She  reports that she has never smoked. She has never used smokeless tobacco. She reports that she drinks alcohol. She reports that she does not use drugs.  SUBJECTIVE:  Transferred to ICU for  deteriorating mental status today.   Today more awake and complaining of right arm pain.   VITAL SIGNS: BP (!) 151/79   Pulse 74   Temp (!) 97.5 F (36.4 C) (Oral)   Resp 16   Ht 5\' 3"  (1.6 m)   Wt 160 lb 7.9 oz (72.8 kg)   SpO2 100%   BMI 28.43 kg/m   HEMODYNAMICS:   hypertensive on no vasoactive support.   VENTILATOR SETTINGS:   extubated  INTAKE / OUTPUT: I/O last 3 completed shifts: In: 4749.6 [I.V.:4649.6; IV Piggyback:100] Out: 1700 [Urine:1700]  PHYSICAL EXAMINATION: General:   Awake woman, looks older than stated age. Neuro:  Alert speaking softly. Follows commands slowly. Paucity of speech.  R facial drop. R hemiplegia with contractures  HEENT:  No icterus. Mucous membranes moist. Cardiovascular:  HS normal Lungs:  Clear. Abdomen: Soft and nontender. Musculoskeletal: Pain on passive range of motion predominantly at the shoulder but also to some extent at the elbow.  Discomfort improves with further motion. Skin: Skin is intact  LABS:  BMET Recent Labs  Lab 06/18/17 0754 06/19/17 0616 06/20/17 0217  NA 139 138 140  K 3.1* 4.2 2.8*  CL 104 103 106  CO2 25 22 23   BUN 6 6 <5*  CREATININE 0.51 0.49 0.46  GLUCOSE 125* 121* 113*    Electrolytes Recent Labs  Lab 06/16/17 1848  06/18/17 0754 06/19/17 0616 06/20/17 0217  CALCIUM  --    < > 9.0 8.7* 8.9  MG 2.0  --   --   --   --    < > = values in this interval not displayed.    CBC Recent Labs  Lab 06/18/17 0754 06/19/17 0616 06/20/17 0357  WBC 6.8 8.4 6.4  HGB 14.8 15.4* 14.1  HCT 42.7 43.5 40.7  PLT 253 265 255    Coag's Recent Labs  Lab 06/16/17 1330  APTT 29  INR 1.12    Sepsis Markers Recent Labs  Lab 06/16/17 1848  LATICACIDVEN 1.7    ABG Recent Labs  Lab 06/18/17 1049  PHART 7.477*  PCO2ART 34.8  PO2ART 81.7*    Liver Enzymes Recent Labs  Lab 06/16/17 1459  AST 29  ALT 26  ALKPHOS 82  BILITOT 1.2  ALBUMIN 3.6    Cardiac Enzymes No results for input(s): TROPONINI, PROBNP in the last 168 hours.  Glucose Recent Labs  Lab 06/18/17 2149 06/19/17 0647 06/19/17 1146 06/19/17 1707 06/19/17 2116 06/20/17 0821  GLUCAP 127* 124* 147* 131* 114* 125*    Imaging No results found.   DISCUSSION: Multifactorial poststroke confusion likely related to medication changes, constipation, sedative use (was prescribed Xanax at the nursing home) also received narcotics for abdominal pain.  Patient had a prolonged depression of sensorium initially at following  her stroke and is likely that minor changes will affect her level of alertness quite substantially.  While the amantadine has recently been restarted, the patient's daughter reports this was discontinued for abnormal left arm movements.  If she continues to progress one could consider discontinuing this medication soon.  ASSESSMENT / PLAN:   NEUROLOGIC A:   Acute encephalopathy post stroke likely multifactorial. P:   -Continue Keppra for possible seizures -Bowel regimen to treat constipation.  Avoid polyethylene containing laxatives -Avoid all sedative medications. -Resume stroke rehabilitation.  She is ready to transfer from the ICU.  FAMILY  - Updates: Husband and daughter were updated at the bedside.  Kipp Brood, MD Pulmonary and Critical Care Medicine  [place] Pager: [phone] after hours [phone]  06/20/2017, 9:56 AM

## 2017-06-20 NOTE — Progress Notes (Signed)
Occupational Therapy Treatment Patient Details Name: Ebony Williams MRN: [id number] DOB: 04-10-1950 Today's Date: 06/20/2017    History of present illness Pt is a 68 y.o. female with recent UTI admitted 06/16/17 with AMS. Of note, admitted 04/06/17 with L MCA and PCA parenchyma infarcts with discharge to CIR then SNF. MRI 5/11 shows no new intracrnial abnormality; continued diffusion abnormality in L basal ganglia and L occipital lobe. PMH includes HTN.   OT comments  Requesting BIOTE[employer] order for RESTING hand splint. Pt noted to have loose stool and redness at peri care. OT to continue to follow and set up a splint schedule. Recommending 2 hours on and 2 hours off during awake hours due to R thumb webspace break down starting. Pt's hand should have PROM provided prior to don of splint. Daughter very active and participating in all care. Daughter works full time and can work from room when needed.   Family undecided on CIR vs SNF. Family refusing return to [facility] due to 1 week bed level care on last admission prior to OOB provided.    Follow Up Recommendations  SNF    Equipment Recommendations  Other (comment)    Recommendations for Other Services PT consult;Speech consult;Other (comment)(BIOTECH RESTING HAND SPLINT)    Precautions / Restrictions Precautions Precautions: Fall       Mobility Bed Mobility Overal bed mobility: Needs Assistance Bed Mobility: Rolling Rolling: Mod assist         General bed mobility comments: Pt log rolling R and L for peri care . pt incontinent and unaware  Transfers                      Balance                                           ADL either performed or assessed with clinical judgement   ADL Overall ADL's : Needs assistance/impaired Eating/Feeding: Maximal assistance;Bed level   Grooming: Maximal assistance   Upper Body Bathing: Maximal assistance                             General ADL  Comments: pt currently with resting hand splint don and wrist flexion inversion causing splint to slide. pt with break down noted at the thumb webspacing. pt with edema to hand .      Vision       Perception     Praxis      Cognition Arousal/Alertness: Awake/alert Behavior During Therapy: Flat affect Overall Cognitive Status: History of cognitive impairments - at baseline                                          Exercises     Shoulder Instructions       General Comments noted to have redness in peri care and daughter reports previous UTI and yeast infection. pt with muscus looking stool duaghter describes as "JELLY"     Pertinent Vitals/ Pain       Pain Assessment: Faces Faces Pain Scale: Hurts whole lot Pain Location: R UE and R LE  Pain Descriptors / Indicators: Discomfort;Grimacing;Moaning Pain Intervention(s): Monitored during session;Premedicated before session;Repositioned  Home Living  Prior Functioning/Environment              Frequency  Min 2X/week        Progress Toward Goals  OT Goals(current goals can now be found in the care plan section)  Progress towards OT goals: Progressing toward goals  Acute Rehab OT Goals Patient Stated Goal: Return to rehab OT Goal Formulation: With patient/family Time For Goal Achievement: 07/01/17 Potential to Achieve Goals: Good ADL Goals Pt Will Perform Grooming: with min assist;sitting Pt Will Perform Upper Body Dressing: with min assist;sitting Pt Will Perform Lower Body Dressing: with mod assist;sit to/from stand Pt Will Transfer to Toilet: with mod assist;stand pivot transfer;bedside commode Pt Will Perform Toileting - Clothing Manipulation and hygiene: with mod assist;sit to/from stand Additional ADL Goal #1: Pt will perform bed mobility with Min A in preparation for ADLs  Plan Discharge plan remains appropriate     Co-evaluation    PT/OT/SLP Co-Evaluation/Treatment: Yes Reason for Co-Treatment: Complexity of the patient's impairments (multi-system involvement);Necessary to address cognition/behavior during functional activity;For patient/therapist safety;To address functional/ADL transfers   OT goals addressed during session: ADL's and self-care;Proper use of Adaptive equipment and DME;Strengthening/ROM      AM-PAC PT "6 Clicks" Daily Activity     Outcome Measure   Help from another person eating meals?: A Lot Help from another person taking care of personal grooming?: A Lot Help from another person toileting, which includes using toliet, bedpan, or urinal?: A Lot Help from another person bathing (including washing, rinsing, drying)?: A Lot Help from another person to put on and taking off regular upper body clothing?: A Lot Help from another person to put on and taking off regular lower body clothing?: A Lot 6 Click Score: 12    End of Session Equipment Utilized During Treatment: Other (comment)(R hand splint)  OT Visit Diagnosis: Other abnormalities of gait and mobility (R26.89);Unsteadiness on feet (R26.81);Muscle weakness (generalized) (M62.81);Other symptoms and signs involving cognitive function;Pain;Hemiplegia and hemiparesis Hemiplegia - Right/Left: Right Hemiplegia - dominant/non-dominant: Dominant Hemiplegia - caused by: Cerebral infarction Pain - Right/Left: Right Pain - part of body: Arm;Hand   Activity Tolerance Patient tolerated treatment well   Patient Left in bed;with call bell/phone within reach;with family/visitor present   Nurse Communication Mobility status;Precautions        Time: 1303(1303)-1340 OT Time Calculation (min): 37 min  Charges: OT General Charges $OT Visit: 1 Visit OT Treatments $Self Care/Home Management : 8-22 mins   Jeri Modena   OTR/L Pager: [phone] Office: [phone] .    Parke Poisson B 06/20/2017, 1:40 PM

## 2017-06-20 NOTE — Progress Notes (Signed)
Pt transferred to 3W02.  Daughter at bedside and notified.  No complications.

## 2017-06-20 NOTE — Evaluation (Signed)
Clinical/Bedside Swallow Evaluation Patient Details  Name: Ebony Williams MRN: [id number] Date of Birth: 1950/07/06  Today's Date: 06/20/2017 Time: SLP Start Time (ACUTE ONLY): 1549 SLP Stop Time (ACUTE ONLY): 1610 SLP Time Calculation (min) (ACUTE ONLY): 21 min  Past Medical History:  Past Medical History:  Diagnosis Date  . History of kidney stones    x1  . Hypertension    Past Surgical History:  Past Surgical History:  Procedure Laterality Date  . CHOLECYSTECTOMY     laparoscopic  . COLONOSCOPY WITH PROPOFOL N/A 01/12/2015   Procedure: COLONOSCOPY WITH PROPOFOL;  Surgeon: Garlan Fair, MD;  Location: [facility] END[facility]COPY;  Service: Endoscopy;  Laterality: N/A;  . TUBAL LIGATION     HPI:  66 year old with past medical history relevant for prior CVA march 2019 with right-sided weakness, left gaze deviation and aphasia status post treatment with TPA, hypertension, history of nephrolithiasis who was D/Cd to [facility] CIR, then to SNF rehab, comes in from SNF with altered mental status.  MRI Evolving ischemia with petechial hemorrhage, laminar necrosis, and developing encephalomalacia in the left MCA and PCA parenchyma affected on 04/07/2017. Continued diffusion abnormality in the left basal ganglia and portions of the left occipital lobe.  Question newly seen low-density in the left side of the pons, which could represent a pontine infarction.    Assessment / Plan / Recommendation Clinical Impression   Pt with improved alertness today in comparison to previous ST therapy sessions; therefore, SLP administered BSE.  Pt demonstrated slowed but effective mastication of solids with no residue in the oral cavity post swallow.  No overt s/s of aspiration were evident with solids or liquids.  My concern is that pt's mentation may at times be a barrier to safe PO intake.  [name] reports that pt had periods of oral holding yesterday when she was less alert and has increased difficulty with masticating  meats during this hospital admission (presumably from fatigue and increased confusion).  [name] reported appropriate strategies to mitigate these periods of increased confusion and/or decreased alertness (stopping meals if pt becomes too lethargic, chopping meats and using condiments to moisten boluses).  As a result, recommend that pt remain on a regular diet with thin liquids and full supervision for use of swallowing precautions.   SLP Visit Diagnosis: Dysphagia, unspecified (R13.10)    Aspiration Risk  Mild aspiration risk    Diet Recommendation Regular;Thin liquid   Liquid Administration via: Cup;Straw Medication Administration: Whole meds with liquid Supervision: Full supervision/cueing for compensatory strategies Compensations: Minimize environmental distractions;Slow rate;Small sips/bites Postural Changes: Seated upright at 90 degrees    Other  Recommendations Oral Care Recommendations: Oral care BID   Follow up Recommendations Inpatient Rehab      Frequency and Duration min 2x/week          Prognosis Prognosis for Safe Diet Advancement: Good Barriers to Reach Goals: Cognitive deficits      Swallow Study   General HPI: 67 year old with past medical history relevant for prior CVA march 2019 with right-sided weakness, left gaze deviation and aphasia status post treatment with TPA, hypertension, history of nephrolithiasis who was D/Cd to [facility] CIR, then to SNF rehab, comes in from SNF with altered mental status.  MRI Evolving ischemia with petechial hemorrhage, laminar necrosis, and developing encephalomalacia in the left MCA and PCA parenchyma affected on 04/07/2017. Continued diffusion abnormality in the left basal ganglia and portions of the left occipital lobe.  Question newly seen low-density in the left side  of the pons, which could represent a pontine infarction.  Type of Study: Bedside Swallow Evaluation Previous Swallow Assessment: FEES at SNF, Reg/thin  Diet  Prior to this Study: Regular;Thin liquids Temperature Spikes Noted: No Respiratory Status: Room air History of Recent Intubation: No Behavior/Cognition: Confused;Lethargic/Drowsy;Distractible Oral Cavity Assessment: Within Functional Limits Oral Care Completed by SLP: No Oral Cavity - Dentition: Adequate natural dentition Vision: Functional for self-feeding Self-Feeding Abilities: Able to feed self Patient Positioning: Upright in bed Baseline Vocal Quality: Hoarse;Low vocal intensity Volitional Cough: Cognitively unable to elicit Volitional Swallow: Unable to elicit    Oral/Motor/Sensory Function Overall Oral Motor/Sensory Function: Mild impairment Facial ROM: Reduced right Facial Symmetry: Abnormal symmetry right Facial Strength: Reduced right Lingual ROM: Reduced right Lingual Symmetry: Abnormal symmetry right Lingual Strength: Reduced   Ice Chips     Thin Liquid Thin Liquid: Within functional limits    Nectar Thick     Honey Thick     Puree     Solid   GO   Solid: Within functional limits        Ebony Williams 06/20/2017,4:16 PM

## 2017-06-21 ENCOUNTER — Encounter: Payer: Medicare Other | Admitting: Physical Medicine & Rehabilitation

## 2017-06-21 LAB — GLUCOSE, CAPILLARY
Glucose-Capillary: 97 mg/dL (ref 65–99)
Glucose-Capillary: 108 mg/dL — ABNORMAL HIGH (ref 65–99)
Glucose-Capillary: 150 mg/dL — ABNORMAL HIGH (ref 65–99)
Glucose-Capillary: 111 mg/dL — ABNORMAL HIGH (ref 65–99)

## 2017-06-21 LAB — BASIC METABOLIC PANEL
Anion gap: 12 (ref 5–15)
CHLORIDE: 106 mmol/L (ref 101–111)
CO2: 23 mmol/L (ref 22–32)
CREATININE: 0.48 mg/dL (ref 0.44–1.00)
Calcium: 8.9 mg/dL (ref 8.9–10.3)
GFR calc Af Amer: >60 mL/min (ref >60)
GFR calc non Af Amer: >60 mL/min (ref >60)
Glucose, Bld: 116 mg/dL — ABNORMAL HIGH (ref 65–99)
POTASSIUM: 2.9 mmol/L — AB (ref 3.5–5.1)
SODIUM: 141 mmol/L (ref 135–145)

## 2017-06-21 LAB — MAGNESIUM: Magnesium: 1.4 mg/dL — ABNORMAL LOW (ref 1.7–2.4)

## 2017-06-21 MED ORDER — POTASSIUM CHLORIDE CRYS ER 20 MEQ PO TBCR
40.0000 meq | EXTENDED_RELEASE_TABLET | Freq: Two times a day (BID) | ORAL | Status: AC
  Administered 2017-06-21 (×2): 40 meq via ORAL
  Filled 2017-06-21 (×2): qty 2

## 2017-06-21 MED ORDER — CEPHALEXIN 250 MG/5ML PO SUSR
500.0000 mg | Freq: Two times a day (BID) | ORAL | Status: DC
  Administered 2017-06-21 – 2017-06-22 (×2): 500 mg via ORAL
  Filled 2017-06-21 (×2): qty 10

## 2017-06-21 MED ORDER — MAGNESIUM SULFATE 2 GM/50ML IV SOLN
2.0000 g | Freq: Once | INTRAVENOUS | Status: AC
  Administered 2017-06-21: 2 g via INTRAVENOUS
  Filled 2017-06-21: qty 50

## 2017-06-21 NOTE — Progress Notes (Signed)
STROKE TEAM PROGRESS NOTE       SUBJECTIVE (INTERVAL HISTORY) Ebony Williams Ebony Williams , and Ebony Williams are  at bedside, Pt is  More alert and interactive today and speaking  better. OBJECTIVE Temp:  [97.8 F (36.6 C)-98.6 F (37 C)] 98.6 F (37 C) (05/16 1130) Pulse Rate:  [74-93] 74 (05/16 1130) Cardiac Rhythm: Normal sinus rhythm (05/16 0700) Resp:  [18-31] 20 (05/16 1130) BP: (146-164)/(64-83) 146/79 (05/16 1130) SpO2:  [94 %-100 %] 96 % (05/16 1130)  CBC:  Recent Labs  Lab 06/16/17 1330  06/19/17 0616 06/20/17 0357  WBC 11.9*   < > 8.4 6.4  NEUTROABS 7.8*  --   --   --   HGB 16.3*   < > 15.4* 14.1  HCT 45.4   < > 43.5 40.7  MCV 89.2   < > 88.8 88.5  PLT 347   < > 265 255   < > = values in this interval not displayed.    Basic Metabolic Panel:  Recent Labs  Lab 06/16/17 1848  06/20/17 0217 06/21/17 0441  NA  --    < > 140 141  K  --    < > 2.8* 2.9*  CL  --    < > 106 106  CO2  --    < > 23 23  GLUCOSE  --    < > 113* 116*  BUN  --    < > <5* <5*  CREATININE  --    < > 0.46 0.48  CALCIUM  --    < > 8.9 8.9  MG 2.0  --   --  1.4*   < > = values in this interval not displayed.    Lipid Panel:     Component Value Date/Time   CHOL 159 04/07/2017 0226   TRIG 98 04/07/2017 0226   HDL 32 (L) 04/07/2017 0226   CHOLHDL 5.0 04/07/2017 0226   VLDL 20 04/07/2017 0226   LDLCALC 107 (H) 04/07/2017 0226   HgbA1c:  Lab Results  Component Value Date   HGBA1C 6.4 (H) 04/07/2017   Urine Drug Screen:     Component Value Date/Time   LABOPIA NONE DETECTED 06/16/2017 1645   COCAINSCRNUR NONE DETECTED 06/16/2017 1645   LABBENZ POSITIVE (A) 06/16/2017 1645   AMPHETMU NONE DETECTED 06/16/2017 1645   THCU NONE DETECTED 06/16/2017 1645   LABBARB NONE DETECTED 06/16/2017 1645    Alcohol Level     Component Value Date/Time   ETH <10 04/06/2017 1714    IMAGING  Ct Angio Head W Or Wo Contrast Ct Angio Neck W Or Wo Contrast 06/16/2017 IMPRESSION: No intracranial large or  medium vessel occlusion identified. Atherosclerotic tortuosity of the aorta. 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 Minimal atherosclerotic change at the left carotid bifurcation but no stenosis.    Mr Jeri Cos And Wo Contrast 06/16/2017 IMPRESSION:  1. Evolving ischemia with petechial hemorrhage, laminar necrosis, and developing encephalomalacia in the left MCA and PCA parenchyma affected on 04/07/2017. Continued diffusion abnormality in the left basal ganglia and portions of the left occipital lobe. Post ischemic enhancement.  2. Underlying advanced chronic small vessel disease suspected. Pronounced chronic appearing signal changes in the medial thalami and pons.  3. No new intracranial ischemia or new intracranial abnormality identified.    Ct Head Code Stroke Wo Contrast 06/16/2017 IMPRESSION:  1. Question newly seen low-density in the left side of the pons, which could represent  a pontine infarction.  2. Late subacute infarctions in the left occipital lobe and in the left basal ganglia and deep white matter show expected evolutionary changes.  3. Extensive chronic small-vessel ischemic changes elsewhere throughout the hemispheric white matter. 4. ASPECTS is 10. 5.     Dg Chest Portable 1 View 06/16/2017 IMPRESSION:  Mild LEFT basilar atelectasis.    Transthoracic Echocardiogram  04/08/2017 Study Conclusions - Left ventricle: The cavity size was normal. Wall thickness was   increased in a pattern of mild LVH. There was moderate focal   basal hypertrophy of the septum. Systolic function was vigorous.   The estimated ejection fraction was in the range of 65% to 70%.   Wall motion was normal; there were no regional wall motion   abnormalities. Doppler parameters are consistent with abnormal   left ventricular relaxation (grade 1 diastolic dysfunction). - Aortic valve: There was mild regurgitation. Impressions: - Vigorous LV systolic  function; mild LVH with moderate proximal   septal thickening and narrow LVOT; mildly elevated LVOT gradient   (peak velocity 2 m/s); mild diastolic dysfunction; mild AI.   Bilateral Carotid Dopplers -not needed as she had CT angiogram of the neck  24 hour continuous EEG 06/19/17 : abnormal EEG due to the presence of the following: 1) Background delta and theta slowing, suggesting moderate to severe encephalopathy, which is nonspecific as to etiology; 2) Prominent focal polymorphic delta slowing in the left hemisphere, suggesting left hemispheric dysfunction and/or structural lesion, consistent with patient's history of stroke; 3) Frequent epileptiform discharges in the left posterior temporoparietal region, which waxed and waned and oftentimes appeared semi-periodic, concerning for intermittent focal electrographic seizures.      PHYSICAL EXAM Vitals:   06/21/17 0000 06/21/17 0434 06/21/17 0752 06/21/17 1130  BP: (!) 152/71 (!) 147/72 (!) 159/83 (!) 146/79  Pulse: 82 78 78 74  Resp: 18 20 20 20   Temp: 98.2 F (36.8 C) 97.8 F (36.6 C) 97.9 F (36.6 C) 98.6 F (37 C)  TempSrc: Oral Oral Oral Oral  SpO2: 99% 94% 98% 96%  Weight:      Height:        General elderly Caucasian lady not in distress  Heart - Regular rate and rhythm - no murmer appreciated Lungs - Clear to auscultation anteriorly Abdomen - Soft - non tender Extremities - Distal pulses intact - no edema Skin - Warm and dry  Neurological Examination Mental Status: Awake with eyes open.  .  following commands   Speech slightly soft but fluent and able to speak sentences now.   Cranial Nerves: II: No blink to threat on right. PERRL. III,IV, VI: Eyes at midline, initially not deviating towards or away from visual stimuli with blank stare.  V,VII: Right facial droop. Decreased reaction to right sided stimuli.  VIII: Responds to  simple motor commands on the left side only IX,X:able to talk a little and swallow XI:  Head rotated preferentially to left XII: Does not protrude tongue Motor: RUE: Increased tone. Drops immediately to bed when raised. No movement to command or to noxious. 0/5 right upper extremity RLE: Increased extensor tone. Drops to bed with slight resistance against gravity. Weak ankle dorsiflexion to noxious plantar stimulation. 1/5 right lower extremity strength LUE: Elevates antigravity without drift.   LLE: Elevates antigravity without drift. Withdraws briskly to noxious.  Sensory: Decreased reaction to right sided stimuli.  Cerebellar/Gait: Unable to assess.       ASSESSMENT/PLAN Ms. MALIE KASHANI is a 67  y.o. female with history of hypertension, previous strokes by imaging, and renal calculi brought to the emergency department after she was found nonverbal. She did not receive IV t-PA due to recent infarcts  Stroke:  Recurrent left basal ganglia and MCA branch infarcts with known moderate left MCA stenosis. Unclear whether patient's decline in mental status is related to subacute recurrent strokes versus other treatable etiologies like infection, electrolyte imbalance or   electrographic brief seizures  Resultant  Depression, R hemiparesis   CT head - Question newly seen low-density in the left side of the pons, which could represent a pontine infarction.  MRI head - Evolving ischemia with petechial hemorrhage, laminar necrosis, and developing encephalomalacia in the left MCA and PCA parenchyma affected on 04/07/2017. Continued diffusion abnormality in the left basal ganglia and portions of the left occipital lobe.  MRA head - not performed  CTA H&N - No intracranial large or medium vessel occlusion identified.  2D Echo - 04/08/2017 - EF 65 - 70% no cardiac source of emboli identified.  LDL - 107  HgbA1c - 6.4  VTE prophylaxis - subcutaneous heparin Diet Order           Diet heart healthy/carb modified Room service appropriate? Yes; Fluid consistency: Thin  Diet  effective now          aspirin 81 mg daily and clopidogrel 75 mg daily prior to admission, now on aspirin 81 mg daily and clopidogrel 75 mg daily  Patient counseled to be compliant with Ebony Williams antithrombotic medications  Ongoing aggressive stroke risk factor management  Therapy recommendations:  pending  Disposition:  Pending  Hypertension  Stable, soft SBP, will D/C all BP meds for now  Permissive hypertension (OK if < 220/120) but gradually normalize in 5-7 days  Long-term BP goal normotensive  Hyperlipidemia  Lipid lowering medication PTA:  none  LDL 107, goal < 70  Current lipid lowering medication start Lipitor 40 mg daily  Continue statin at discharge   Other Stroke Risk Factors  Advanced age  ETOH use, advised to drink no more than 1 alcoholic beverage per day.  Overweight, Body mass index is 28.43 kg/m., recommend weight loss, diet and exercise as appropriate   Hx stroke/TIA   Other Active Problems  Mild leukocytosis   Plan / Recommendations    Therapy Follow Up: pending  Disposition: pending  Antiplatelet / Anticoagulation: continue aspirin 81 mg daily and Plavix 75 mg daily  Statin: Lipitor 40 mg daily started  off BP meds and monitor BP  Depression: add prozac 40mg  QHS to help with depression and muscle weakness, off effexor   Poor po intake, nutrition consult and 48H calories intake   Continue keflex for UTI  Plan discussed in details with Ebony Williams Ebony Williams and Ebony Williams  at bedside   Further risk factor modification per primary care MD: Follow Up 2 weeks  Recommend  y, IV hydration and electrolyte replacement and treatment for UTI. On discussion with the patient, Ebony Williams,Ebony Williams at the bedside and answered questions. Discussed with Dr. Lynetta Mare  Continue Keppra    1000 mg every 12 hourly given abnormal as EEG showed  left-sided  brief electrographic seizure   Hospital day # 5  Greater than 50% time during this 25 minute visit was  spent on counseling and coordination of care about Ebony Williams decline in mental status, review of continuous EEG findings and discussion about treatment and answering questions.discuss with patient, Ebony Williams, Dr Bonner Puna and answered questions.continue Keppra in the present dose for  seizures. And aspirin and Plavix for stroke prevention.Anticipate transfer to rehabilitation and skilled nursing facility over the next few days.Stroke team will sign off. Kindly call for questions. Follow-up as an outpatient in the stroke clinic in 6 weeks.  Ebony Contras, MD Medical Director [facility] Pager: [phone] 06/21/2017 1:09 PM To contact Stroke Continuity provider, please refer to [email or web address] After hours, contact General Neurology

## 2017-06-21 NOTE — Care Management Important Message (Signed)
Important Message  Patient Details  Name: Ebony Williams MRN: [id number] Date of Birth: 1950/07/05   Medicare Important Message Given:  Yes    Markitta Ausburn Montine Circle 06/21/2017, 1:45 PM

## 2017-06-21 NOTE — Progress Notes (Signed)
Occupational Therapy Treatment Patient Details Name: Ebony Williams MRN: [id number] DOB: Dec 03, 1950 Today's Date: 06/21/2017    History of present illness Pt is a 67 y.o. female with recent UTI admitted 06/16/17 with AMS. Of note, admitted 04/06/17 with L MCA and PCA parenchyma infarcts with discharge to CIR then SNF. MRI 5/11 shows no new intracrnial abnormality; continued diffusion abnormality in L basal ganglia and L occipital lobe. PMH includes HTN.   OT comments  Pt with flexor spasticity Rt hand and extensor spasticity Rt UE.  Soft tissue mobilization performed all joints followed by gentle PROM and stretch.  Resting hand splint applied to hand with good tolerance from pt (prior to mobs, pt indicated significant pain).   Returned ~2.5 hours later to assess pt's tolerance for splint wear, however, splint had been removed and unable to determine who long she had it on before it was removed.  Will continue attempts to establish splint wear schedule.    Follow Up Recommendations  SNF    Equipment Recommendations  Other (comment)    Recommendations for Other Services      Precautions / Restrictions Precautions Precautions: Fall Restrictions Weight Bearing Restrictions: Yes       Mobility Bed Mobility                  Transfers                      Balance                                           ADL either performed or assessed with clinical judgement   ADL                                               Vision       Perception     Praxis      Cognition Arousal/Alertness: Awake/alert Behavior During Therapy: Flat affect Overall Cognitive Status: Difficult to assess                                          Exercises Other Exercises Other Exercises: Pt with significant flexor tone of digits with extensor spasticity of UE.  She indicates significant pain with any attempt at ROM of digits.   Gentle PROM performed of fingers, followed by soft tissue mobilizations of digits (each joint, each finger), metacarpals, carpals, forearm as wall as scapula and humerus, followed by gentle stretch and PROM of all joint of Rt UE.  Resting hand splint applied to hand with good positioning achieved.  Pt indicated reduction in pain.  Returned in ~2.5 hours to assess hand and her tolerance for splint wear, however splint had been removed.  CNA unsure who removed splint.  Will continue to work with pt to establish splint schedule    Shoulder Instructions       General Comments      Pertinent Vitals/ Pain       Pain Assessment: Faces Faces Pain Scale: Hurts even more Pain Location: Rt UE with any attempt at moving or performing ROM  Pain Descriptors / Indicators: Grimacing;Guarding;Moaning Pain Intervention(s):  Limited activity within patient's tolerance;Monitored during session;Repositioned  Home Living                                          Prior Functioning/Environment              Frequency  Min 2X/week        Progress Toward Goals  OT Goals(current goals can now be found in the care plan section)  Progress towards OT goals: Progressing toward goals     Plan Discharge plan remains appropriate    Co-evaluation                 AM-PAC PT "6 Clicks" Daily Activity     Outcome Measure   Help from another person eating meals?: A Lot Help from another person taking care of personal grooming?: A Lot Help from another person toileting, which includes using toliet, bedpan, or urinal?: A Lot Help from another person bathing (including washing, rinsing, drying)?: A Lot Help from another person to put on and taking off regular upper body clothing?: A Lot Help from another person to put on and taking off regular lower body clothing?: A Lot 6 Click Score: 12    End of Session Equipment Utilized During Treatment: Other (comment)(splint)  OT Visit  Diagnosis: Other abnormalities of gait and mobility (R26.89);Unsteadiness on feet (R26.81);Muscle weakness (generalized) (M62.81);Other symptoms and signs involving cognitive function;Pain;Hemiplegia and hemiparesis Hemiplegia - Right/Left: Right Hemiplegia - dominant/non-dominant: Dominant Hemiplegia - caused by: Cerebral infarction Pain - Right/Left: Right Pain - part of body: Arm;Hand   Activity Tolerance Patient tolerated treatment well   Patient Left in bed;with call bell/phone within reach   Nurse Communication          Time: 8182-9937 OT Time Calculation (min): 22 min  Charges: OT General Charges $OT Visit: 1 Visit OT Treatments $Neuromuscular Re-education: 8-22 mins  [facility], OTR/L [phone]    Lucille Passy M 06/21/2017, 11:08 PM

## 2017-06-21 NOTE — Progress Notes (Signed)
Physical Therapy Treatment Patient Details Name: Ebony Williams MRN: [id number] DOB: 05/26/1950 Today's Date: 06/21/2017    History of Present Illness Pt is a 67 y.o. female with recent UTI admitted 06/16/17 with AMS. Of note, admitted 04/06/17 with L MCA and PCA parenchyma infarcts with discharge to CIR then SNF. MRI 5/11 shows no new intracrnial abnormality; continued diffusion abnormality in L basal ganglia and L occipital lobe. PMH includes HTN.    PT Comments    Patient alert and eager to get OOB. Pt required mod A +2 for STS and max A +2 for SPT. Pt led through L UE/LE therex and therapist provided PROM of R LE. [name] present throughout and encouraged to continue UE/LE exercises and educated on PROM/positioning of R hand to aid in decreased edema. Continue to progress as tolerated with anticipated d/c to SNF for further skilled PT services.    Follow Up Recommendations  SNF;Supervision/Assistance - 24 hour     Equipment Recommendations  Other (comment)(TBA)    Recommendations for Other Services       Precautions / Restrictions Precautions Precautions: Fall    Mobility  Bed Mobility Overal bed mobility: Needs Assistance Bed Mobility: Supine to Sit     Supine to sit: Mod assist;HOB elevated;+2 for safety/equipment     General bed mobility comments: cues for sequencing and pt used R side bed rail to assist; +2 required to elelvate trunk due to painful R UE and extra support needed  Transfers Overall transfer level: Needs assistance   Transfers: Stand Pivot Transfers;Sit to/from Stand Sit to Stand: Mod assist;+2 physical assistance;From elevated surface Stand pivot transfers: Max assist;+2 physical assistance       General transfer comment: +2 required for balance and weight shifting; R LE blocked due to weakness, limited ankle DF, and R knee hyperextension; pt assisted with L UE/LE and able to pivot L foot with less assistance  Ambulation/Gait                 Stairs             Wheelchair Mobility    Modified Rankin (Stroke Patients Only) Modified Rankin (Stroke Patients Only) Pre-Morbid Rankin Score: Moderately severe disability Modified Rankin: Severe disability     Balance Overall balance assessment: Needs assistance Sitting-balance support: Single extremity supported;Feet supported Sitting balance-Leahy Scale: Fair     Standing balance support: Single extremity supported;During functional activity Standing balance-Leahy Scale: Poor                              Cognition Arousal/Alertness: Awake/alert Behavior During Therapy: Flat affect Overall Cognitive Status: History of cognitive impairments - at baseline                                        Exercises General Exercises - Upper Extremity Shoulder Flexion: AROM;Left Shoulder Extension: AROM;Left Shoulder ABduction: AROM;Left Shoulder ADduction: AROM;Left General Exercises - Lower Extremity Ankle Circles/Pumps: AROM;Left;20 reps Long Arc Quad: AROM;Left;Seated Hip Flexion/Marching: AROM;Left;Seated Other Exercises Other Exercises: PROM of R LE; pain limiting ROM R UE    General Comments General comments (skin integrity, edema, etc.): daughter present throughout session      Pertinent Vitals/Pain Pain Assessment: Faces Faces Pain Scale: Hurts even more Pain Location: R UE   Pain Descriptors / Indicators: Discomfort;Grimacing;Guarding Pain Intervention(s): Monitored during session;Limited  activity within patient's tolerance;Repositioned    Home Living                      Prior Function            PT Goals (current goals can now be found in the care plan section) Acute Rehab PT Goals Patient Stated Goal: Return to rehab PT Goal Formulation: With patient/family Time For Goal Achievement: 07/01/17 Potential to Achieve Goals: Good Progress towards PT goals: Progressing toward goals    Frequency     Min 3X/week      PT Plan Current plan remains appropriate    Co-evaluation              AM-PAC PT "6 Clicks" Daily Activity  Outcome Measure  Difficulty turning over in bed (including adjusting bedclothes, sheets and blankets)?: Unable Difficulty moving from lying on back to sitting on the side of the bed? : Unable Difficulty sitting down on and standing up from a chair with arms (e.g., wheelchair, bedside commode, etc,.)?: Unable Help needed moving to and from a bed to chair (including a wheelchair)?: A Lot Help needed walking in hospital room?: A Lot Help needed climbing 3-5 steps with a railing? : Total 6 Click Score: 8    End of Session Equipment Utilized During Treatment: Gait belt Activity Tolerance: Patient tolerated treatment well Patient left: with call bell/phone within reach;in chair;with chair alarm set;with family/visitor present Nurse Communication: Mobility status PT Visit Diagnosis: Other abnormalities of gait and mobility (R26.89);Other symptoms and signs involving the nervous system (R29.898);Hemiplegia and hemiparesis Hemiplegia - Right/Left: Right Hemiplegia - dominant/non-dominant: Dominant     Time: 8676-1950 PT Time Calculation (min) (ACUTE ONLY): 31 min  Charges:  $Therapeutic Exercise: 8-22 mins $Therapeutic Activity: 8-22 mins                    G Codes:       Earney Navy, PTA Pager: [phone]     Darliss Cheney 06/21/2017, 11:08 AM

## 2017-06-21 NOTE — Progress Notes (Signed)
PROGRESS NOTE  Ebony Williams  ZOX:[id number] DOB: Nov 16, 1950 DOA: 06/16/2017 PCP: Ebony Sites, MD   Brief Narrative: [place] 67 y.o.femalewith medical history significant forUTI 3 weeks ago, with perineal yeast infection 1 week ago, presenting today with altered mental status and FACA. The patient was independent prior to sustaining a stroke in April 06, 2017, at the time suffering right sided weakness, left gaze and aphasia. She was treated with TPA and discharged home on aspirin and Plavix. The patient has been at the rehab center.Until 10 AM today she was seen her usual state of health, however her daughter came to visit around 18 and found her to be confused. She was not responding to her questions. The patient of note, had a fall yesterday, not hitting her head, but today did not ambulate, was very tearful at the emergency department, and was nonconversant.. Stroke was called at 1313, EMS arrived at 1320 4 PM, NIH SS on arrival was 14,more responsive from reported. CT of the head was negative for acute findings.   Admitted with encephalopathy, AMS. Found to have evolving stroke. Also treating for UTI and possible seizure.   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 CSW consulted.  Acute metabolic encephalopathy: Multifactorial in pt with diminished cerebral reserve due to age and CVD/CVAs. Now also suspect element of acute delirium with waxing/waning circadian course. - Avoid sedating medications, avoid rapid changes in psychotropic medications - Delirium precautions - Complete Tx for UTI and continue AEDs for  seizures - Monitor closely and repeat EEG if becomes unresponsive or appears postictal.  Seizure disorder: Due to encephalomalacia related to strokes. Not in status per neurology. - Continue increased keppra per neurology recommendations.  - Seizure precautions  Depression, anxiety, probably adjustment disorder:  - Continue SSRI - Holding xanax  Klebsiella UTI:  - Converted to keflex, continue through 5/18.  Hypokalemia:  - Only modestly improved with replacement, found to have very low magnesium as well. Both will be replaced today and recheck tomorrow. This may be causing some of mental status changes and/or cramping.   Right hemiparesis with right shoulder pain: Reportedly following a fall out of wheelchair. No bony abnormality on exam and XR of shoulder and elbow without dislocation/fracture.  - Continue PT to avoid adhesive capsulitis - Continue splinting of right hand per OT schedule.  HTN:  - Permissive HTN (hold norvasc, ARB, chlorthalidone)  GERD:  - Continue PPI  HLD:  - Continue statin   DVT prophylaxis: Heparin Code Status: Full Family Communication: Daughter at bedside. Disposition Plan: To SNF when medically stable, possibly 48 hours.  Consultants:   Neurology, Sela Hua, Agarwal  Procedures:  24 hour continuous EEG 06/19/17 : abnormal EEG due to the presence of the following: 1) Background delta and theta slowing, suggesting moderate to severe encephalopathy, which is nonspecific as to etiology; 2) Prominent focal polymorphic delta slowing in the left hemisphere, suggesting left hemispheric dysfunction and/or structural lesion, consistent with patient's history of stroke; 3) Frequent epileptiform discharges in the left posterior temporoparietal region,whichwaxed and waned andoftentimes appeared semi-periodic, concerning for intermittent focal electrographic seizures.  Antimicrobials:  Ceftriaxone 5/12 - 5/15  Keflex 5/15 -  5/18  Subjective: Was  very interactive last night, but this morning when daughter returned she would not say her own name. On my evaluation, the patient is oriented and correctly identifies her daughter by name and myself. Had BMs last night (felt to be constipated prior)  Objective: Vitals:   06/20/17 2000 06/21/17 0000 06/21/17 0434 06/21/17 0752  BP: (!) 150/75 (!) 152/71 (!) 147/72 (!) 159/83  Pulse: 84 82 78 78  Resp: 18 18 20 20   Temp: 98.6 F (37 C) 98.2 F (36.8 C) 97.8 F (36.6 C) 97.9 F (36.6 C)  TempSrc: Oral Oral Oral Oral  SpO2: 98% 99% 94% 98%  Weight:      Height:        Intake/Output Summary (Last 24 hours) at 06/21/2017 1028 Last data filed at 06/21/2017 1017 Gross per 24 hour  Intake 740 ml  Output 950 ml  Net -210 ml   Filed Weights   06/18/17 0557 06/19/17 0500 06/20/17 0347  Weight: 71.3 kg (157 lb 3 oz) 72.8 kg (160 lb 7.9 oz) 72.8 kg (160 lb 7.9 oz)    Gen: Tired-appearing 67 y.o. female in no distress Pulm: Non-labored and clear, on room air. CV: Regular rate and rhythm. No murmur, rub, or gallop. No JVD GI: Abdomen soft, non-tender, non-distended, with normoactive bowel sounds. No organomegaly or masses felt. Ext: Spastic right hemiparesis with right hand contractures in splint. Right shoulder without visible deformity or palpable deformity/tenderness. 1+ pitting edema to right foot and hand which is stable. Skin: No rashes, lesions no ulcers Neuro: Interactive but requiring some redirection, cooperative with exam. Hemiparesis, stable. Psych: Judgement and insight appear fair, limited by inattention. Mood depressed.  Data Reviewed: I have personally reviewed following labs and imaging studies  CBC: Recent Labs  Lab 06/16/17 1330 06/16/17 1453 06/17/17 0515 06/18/17 0754 06/19/17 0616 06/20/17 0357  WBC 11.9*  --  10.7* 6.8 8.4 6.4  NEUTROABS 7.8*  --   --   --   --   --   HGB 16.3* 15.3* 15.1* 14.8 15.4* 14.1  HCT 45.4 45.0 43.9 42.7 43.5 40.7  MCV 89.2  --   91.1 90.1 88.8 88.5  PLT 347  --  289 253 265 588   Basic Metabolic Panel: Recent Labs  Lab 06/16/17 1848 06/17/17 0515 06/18/17 0754 06/19/17 0616 06/20/17 0217 06/21/17 0441  NA  --  137 139 138 140 141  K  --  3.7 3.1* 4.2 2.8* 2.9*  CL  --  101 104 103 106 106  CO2  --  23 25 22 23 23   GLUCOSE  --  121* 125* 121* 113* 116*  BUN  --  13 6 6  <5* <5*  CREATININE  --  0.58 0.51 0.49 0.46 0.48  CALCIUM  --  9.3 9.0 8.7* 8.9 8.9  MG 2.0  --   --   --   --  1.4*   GFR: Estimated Creatinine Clearance: 63.5 mL/min (by C-G formula based on SCr of 0.48 mg/dL). Liver Function Tests: Recent Labs  Lab 06/16/17 1459  AST 29  ALT 26  ALKPHOS 82  BILITOT 1.2  PROT 6.8  ALBUMIN 3.6   No results for input(s): LIPASE, AMYLASE in the last 168 hours. No results for input(s): AMMONIA in the last 168 hours. Coagulation Profile: Recent Labs  Lab 06/16/17 1330  INR 1.12   Cardiac Enzymes: No results for input(s): CKTOTAL,  CKMB, CKMBINDEX, TROPONINI in the last 168 hours. BNP (last 3 results) No results for input(s): PROBNP in the last 8760 hours. HbA1C: No results for input(s): HGBA1C in the last 72 hours. CBG: Recent Labs  Lab 06/20/17 0821 06/20/17 1216 06/20/17 1644 06/20/17 2259 06/21/17 0622  GLUCAP 125* 252* 120* 148* 97   Lipid Profile: No results for input(s): CHOL, HDL, LDLCALC, TRIG, CHOLHDL, LDLDIRECT in the last 72 hours. Thyroid Function Tests: Recent Labs    06/18/17 1050  TSH 0.941   Anemia Panel: No results for input(s): VITAMINB12, FOLATE, FERRITIN, TIBC, IRON, RETICCTPCT in the last 72 hours. Urine analysis:    Component Value Date/Time   COLORURINE YELLOW 06/16/2017 [place] 06/16/2017 1508   LABSPEC >1.046 (H) 06/16/2017 1508   PHURINE 6.0 06/16/2017 1508   GLUCOSEU NEGATIVE 06/16/2017 1508   HGBUR NEGATIVE 06/16/2017 1508   BILIRUBINUR NEGATIVE 06/16/2017 1508   KETONESUR 5 (A) 06/16/2017 1508   PROTEINUR NEGATIVE  06/16/2017 1508   NITRITE NEGATIVE 06/16/2017 1508   LEUKOCYTESUR SMALL (A) 06/16/2017 1508   Recent Results (from the past 240 hour(s))  Urine culture     Status: Abnormal   Collection Time: 06/16/17  4:45 PM  Result Value Ref Range Status   Specimen Description URINE, RANDOM  Final   Special Requests   Final    NONE Performed at [facility], [place] [address] [place], [place] [address]    Culture >=100,000 COLONIES/mL KLEBSIELLA PNEUMONIAE (A)  Final   Report Status 06/19/2017 FINAL  Final   Organism ID, Bacteria KLEBSIELLA PNEUMONIAE (A)  Final      Susceptibility   Klebsiella pneumoniae - MIC*    AMPICILLIN RESISTANT Resistant     CEFAZOLIN <=4 SENSITIVE Sensitive     CEFTRIAXONE <=1 SENSITIVE Sensitive     CIPROFLOXACIN <=0.25 SENSITIVE Sensitive     GENTAMICIN <=1 SENSITIVE Sensitive     IMIPENEM <=0.25 SENSITIVE Sensitive     NITROFURANTOIN 64 INTERMEDIATE Intermediate     TRIMETH/SULFA <=20 SENSITIVE Sensitive     AMPICILLIN/SULBACTAM 4 SENSITIVE Sensitive     PIP/TAZO <=4 SENSITIVE Sensitive     Extended ESBL NEGATIVE Sensitive     * >=100,000 COLONIES/mL KLEBSIELLA PNEUMONIAE  MRSA PCR Screening     Status: None   Collection Time: 06/16/17  6:51 PM  Result Value Ref Range Status   MRSA by PCR NEGATIVE NEGATIVE Final    Comment:        The GeneXpert MRSA Assay (FDA approved for NASAL specimens only), is one component of a comprehensive MRSA colonization surveillance program. It is not intended to diagnose MRSA infection nor to guide or monitor treatment for MRSA infections. Performed at [facility], [place] [address], [place], [place] [address]       Radiology Studies: Dg Shoulder Right  Result Date: 06/20/2017 CLINICAL DATA:  Status post fall.  History of previous CVA. EXAM: RIGHT SHOULDER - 2+ VIEW COMPARISON:  Limited views of the right shoulder from a chest x-ray of April 16, 2017. FINDINGS: The bones of the right shoulder are subjectively  adequately mineralized. There is no acute fracture or dislocation. There is subjective mild narrowing of the glenohumeral joint. The [facility] joint is reasonably well-maintained. The subacromial subdeltoid space is normal. IMPRESSION: There is no acute bony abnormality of the right shoulder. I can't exclude mild degenerative narrowing of the glenohumeral joint. Electronically Signed   By: David  Martinique M.D.   On: 06/20/2017 13:22  Dg Elbow 2 Views Right  Result Date: 06/20/2017 CLINICAL DATA:  Status post fall.  History of previous CVA EXAM: RIGHT ELBOW - 2 VIEW COMPARISON:  None in PACs FINDINGS: The bones of the elbow are subjectively adequately mineralized. There is no acute fracture nor dislocation. There is no joint effusion. The overlying soft tissues exhibit no acute abnormalities. IMPRESSION: There is no acute or significant chronic bony abnormality of the right elbow. Electronically Signed   By: David  Martinique M.D.   On: 06/20/2017 13:17    Scheduled Meds: . acetaminophen  500 mg Oral TID  . amantadine  100 mg Oral BID  . aspirin  81 mg Oral Daily  . atorvastatin  40 mg Oral q1800  . cephALEXin  500 mg Oral Q12H  . clopidogrel  75 mg Oral Daily  . feeding supplement  1 Container Oral TID BM  . FLUoxetine  40 mg Oral QHS  . heparin  5,000 Units Subcutaneous Q8H  . insulin aspart  0-5 Units Subcutaneous QHS  . insulin aspart  0-9 Units Subcutaneous TID WC  . levETIRAcetam  1,000 mg Oral BID  . pantoprazole  40 mg Oral Daily  . potassium chloride  40 mEq Oral BID WC  . senna-docusate  2 tablet Oral QHS   Continuous Infusions:   LOS: 5 days   Time spent: 25 minutes.  Patrecia Pour, MD [facility] Pager [phone]  If 7PM-7AM, please contact night-coverage [email or web address] Password [facility] 06/21/2017, 10:28 AM

## 2017-06-22 ENCOUNTER — Non-Acute Institutional Stay (SKILLED_NURSING_FACILITY): Payer: Medicare Other | Admitting: Internal Medicine

## 2017-06-22 ENCOUNTER — Inpatient Hospital Stay
Admission: RE | Admit: 2017-06-22 | Discharge: 2017-07-21 | Disposition: A | Discharge status: Home / Self Care | Payer: Medicare Other | Source: Ambulatory Visit | Attending: Internal Medicine | Admitting: Internal Medicine

## 2017-06-22 DIAGNOSIS — I10 Essential (primary) hypertension: Secondary | ICD-10-CM | POA: Diagnosis not present

## 2017-06-22 DIAGNOSIS — E876 Hypokalemia: Secondary | ICD-10-CM

## 2017-06-22 DIAGNOSIS — G8191 Hemiplegia, unspecified affecting right dominant side: Secondary | ICD-10-CM

## 2017-06-22 DIAGNOSIS — R5383 Other fatigue: Secondary | ICD-10-CM

## 2017-06-22 DIAGNOSIS — F331 Major depressive disorder, recurrent, moderate: Secondary | ICD-10-CM

## 2017-06-22 DIAGNOSIS — R7303 Prediabetes: Secondary | ICD-10-CM

## 2017-06-22 DIAGNOSIS — I63512 Cerebral infarction due to unspecified occlusion or stenosis of left middle cerebral artery: Secondary | ICD-10-CM | POA: Diagnosis not present

## 2017-06-22 DIAGNOSIS — N390 Urinary tract infection, site not specified: Secondary | ICD-10-CM

## 2017-06-22 LAB — BASIC METABOLIC PANEL
ANION GAP: 8 (ref 5–15)
CALCIUM: 8.9 mg/dL (ref 8.9–10.3)
CO2: 24 mmol/L (ref 22–32)
Chloride: 109 mmol/L (ref 101–111)
Creatinine, Ser: 0.46 mg/dL (ref 0.44–1.00)
GFR calc Af Amer: >60 mL/min (ref >60)
GLUCOSE: 114 mg/dL — AB (ref 65–99)
Potassium: 3.4 mmol/L — ABNORMAL LOW (ref 3.5–5.1)
SODIUM: 141 mmol/L (ref 135–145)

## 2017-06-22 LAB — GLUCOSE, CAPILLARY
Glucose-Capillary: 106 mg/dL — ABNORMAL HIGH (ref 65–99)
Glucose-Capillary: 129 mg/dL — ABNORMAL HIGH (ref 65–99)

## 2017-06-22 LAB — MAGNESIUM: MAGNESIUM: 1.9 mg/dL (ref 1.7–2.4)

## 2017-06-22 MED ORDER — FLUOXETINE HCL 40 MG PO CAPS: 40.0000 mg | ORAL_CAPSULE | Freq: Every day | ORAL | 3 refills | Status: AC

## 2017-06-22 MED ORDER — POTASSIUM CHLORIDE CRYS ER 20 MEQ PO TBCR
20.0000 meq | EXTENDED_RELEASE_TABLET | Freq: Once | ORAL | Status: AC
  Administered 2017-06-22: 20 meq via ORAL
  Filled 2017-06-22: qty 1

## 2017-06-22 MED ORDER — CEPHALEXIN 250 MG/5ML PO SUSR: 500.0000 mg | Freq: Two times a day (BID) | ORAL | 0 refills | Status: AC

## 2017-06-22 MED ORDER — ATORVASTATIN CALCIUM 40 MG PO TABS
40.0000 mg | ORAL_TABLET | Freq: Every day | ORAL | Status: AC
Start: 2017-06-22 — End: ?

## 2017-06-22 MED ORDER — LEVETIRACETAM 1000 MG PO TABS: 1000.0000 mg | ORAL_TABLET | Freq: Two times a day (BID) | ORAL | Status: DC

## 2017-06-22 NOTE — Progress Notes (Signed)
This is an acute visit.  Level care skilled.  Facility is [facility].  Chief complaint-acute visit status post hospitalization for CVA.  History of present illness.  Patient is a pleasant 67 year old female with a history of a recent stroke back in March 2019 also recent treatment for UTI as well as a yeast infection.  Patient previously was independent before sustaining a CVA in early March at that time she sustained right-sided weakness with a left gaze preference and aphasia.  She was treated with TPA and discharged on aspirin and Plavix.  She apparently was at baseline until the morning of May 11 when her daughter came to visit her at rehab facility and found her to be increasingly confused.  She was found to be careful and not conversant in the ER-.  CT of the head was negative for any acute findings--she was admitted for encephalopathy and found to have an evolving stroke.  She was also treated for UTI and Keppra was started for apparent new seizure disorder.  Her other medical conditions include depression-dysphagia post stroke-hypertension- history of hypokalemia.  In regards to the subacute recurrent stroke recommendation was to continue aspirin Plavix and a statin and recommended therapy at skilled nursing continued amantadine per neurology recommendations.  She also is completing treatment for UTI with Keflex.  Recommendation to repeat an EEG if she becomes unresponsive or appears to have had a seizure  Regards to seizure disorder she continues on Keppra and will need follow-up by neurology in approximately 6 weeks.  She continues on Prozac for depression this was changed from Effexor because of concern for an encephalopathy.  Her Xanax was held secondary to the encephalopathy.  She also had hypokalemia that improved once her magnesium potassium were replaced will monitor.  In regards to hypertension recommendation to permit permissive hypertension for now  secondary to recent stroke Norvasc are being chlorothiazide known was held.  Recommend titrating back slowly over the next week.  Regards encourage continues on a PPI she also has had history of pre-diabetes hemoglobin A1c was 6.4 recommendation is to continue Glucophage for risk reduction.  Again she is on a statin as well which was increased from 10 mg up to 40 mg  Currently she is resting in bed comfortably does not have any acute complaints- blood pressure is somewhat elevated at 160/80 again recommendation is to permit permissive hypertension for now   Past Medical History:  Diagnosis Date  . History of kidney stones    x1  . Hypertension          Past Surgical History:  Procedure Laterality Date  . CHOLECYSTECTOMY     laparoscopic  . COLONOSCOPY WITH PROPOFOL N/A 01/12/2015   Procedure: COLONOSCOPY WITH PROPOFOL;  Surgeon: Garlan Fair, MD;  Location: [facility];  Service: Endoscopy;  Laterality: N/A;  . TUBAL LIGATION      Social History Social History        Socioeconomic History  . Marital status: Married    Spouse name: Not on file  . Number of children: Not on file  . Years of education: Not on file  . Highest education level: Not on file  Occupational History  . Not on file  Social Needs  . Financial resource strain: Not on file  . Food insecurity:    Worry: Not on file    Inability: Not on file  . Transportation needs:    Medical: Not on file    Non-medical: Not on file  Tobacco Use  . Smoking status: Never Smoker  . Smokeless tobacco: Never Used  Substance and Sexual Activity  . Alcohol use: Yes    Comment: very rare  . Drug use: No  . Sexual activity: Not on file  Lifestyle  . Physical activity:    Days per week: Not on file    Minutes per session: Not on file  . Stress: Not on file  Relationships  . Social connections:    Talks on phone: Not on file    Gets together: Not on file    Attends  religious service: Not on file    Active member of club or organization: Not on file    Attends meetings of clubs or organizations: Not on file    Relationship status: Not on file  . Intimate partner violence:    Fear of current or ex partner: Not on file    Emotionally abused: Not on file    Physically abused: Not on file    Forced sexual activity: Not on file  Other Topics Concern  . Not on file  Social History Narrative  . Not on file         Allergies  Allergen Reactions  . Sulfa Antibiotics Diarrhea and Itching         Family History  Problem Relation Age of Onset  . Diabetes Mother   . Hypertension Mother      Allergies as of 06/22/2017      Reactions   Sulfa Antibiotics Diarrhea, Itching         Medication List     amantadine 100 MG capsule Commonly known as:  SYMMETREL Take 2 capsules (200 mg total) by mouth 2 (two) times daily.   aspirin 81 MG chewable tablet Chew 1 tablet (81 mg total) by mouth daily.   atorvastatin 40 MG tablet Commonly known as:  LIPITOR Take 1 tablet (40 mg total) by mouth daily. What changed:    medication strength  how much to take  when to take this   cephALEXin 250 MG/5ML suspension Commonly known as:  KEFLEX Take 10 mLs (500 mg total) by mouth every 12 (twelve) hours for 1 day.   clopidogrel 75 MG tablet Commonly known as:  PLAVIX Take 1 tablet (75 mg total) by mouth daily. What changed:  when to take this   FLUoxetine 40 MG capsule Commonly known as:  PROZAC Take 1 capsule (40 mg total) by mouth at bedtime.   levETIRAcetam 1000 MG tablet Commonly known as:  KEPPRA Take 1 tablet (1,000 mg total) by mouth 2 (two) times daily.   metFORMIN 500 MG tablet Commonly known as:  GLUCOPHAGE Take 1 tablet (500 mg total) by mouth daily with breakfast.   nystatin powder Generic drug:  nystatin Apply topically See admin instructions. Apply topically to groin and outer vaginal area  daily after bath and twice daily as needed for rash/irritation   ondansetron 4 MG tablet Commonly known as:  ZOFRAN Take 4 mg by mouth every 8 (eight) hours as needed for nausea or vomiting.   pantoprazole 40 MG tablet Commonly known as:  PROTONIX Take 1 tablet (40 mg total) by mouth daily.   polyethylene glycol packet Commonly known as:  MIRALAX / GLYCOLAX Take 17 g by mouth daily. Mix with 8-10 oz water and drink   PRESCRIPTION MEDICATION Inhale into the lungs at bedtime. CPAP  Review of systems.  In general she is not complaining of fever chills.  Skin is not complaining of rash or itching.  Head ears eyes nose mouth and throat does have history of left gaze preference.  Does not complain of sore throat.  Respiratory is not complaining of shortness of breath or cough.  Cardiac denies chest pain does not have significant lower extremity edema.  GI is not complaining of abdominal pain nausea vomiting diarrhea constipation.  GU is being treated for UTI does not complain of overt dysuria.  Musculoskeletal is not complaining of joint pain does have right-sided weakness.  Neurologic again does have right-sided deficits does not complain of headache dizziness at this time or numbness.  Psyche- does have a history of depression as well as anxiety at this point appears relatively stable  --Physical exam.  She is afebrile pulse of 80 respirations of 17 blood pressure 160/80.  In general this is a pleasant elderly female in no distress lying comfortably in bed.  Her skin is warm and dry.  Eyes pupils appear equal round reactive light sclera and conjunctive are clear.  Head she does have a right facial droop.  Oropharynx is clear mucous membranes moist tongue is midline does not really protrude her tongue or stick it out.  Chest is clear to auscultation there is no labored breathing.  Heart is regular rate and rhythm without  murmur gallop or rub she does not have significant lower extremity edema.  Her abdomen is soft nontender with positive bowel sounds.  Musculoskeletal does have right-sided hemiparalysis upper and lower extremities minimal movement of her right lower extremity-she does have a splint applied to her right arm. Appears able to move her upper and lower left extremities at baseline.    Neurologic as noted above--her speech is clear but very soft.  Psych she appears alert and oriented very pleasant and appropriate.  Labs.  Jun 22, 2017.  Magnesium 1.9.  Sodium 141 potassium 3.4 BUN less than 5 creatinine 0.46  WBC 6.4 hemoglobin 14.1 platelets 255.  Jun 18, 2017.  TSH was 0.941   Jun 16, 2017.  Liver function test within normal limits.  Assessment and plan.  1.  History of CVA with extension- continues on aspirin Plavix and a statin Lipitor was increased-recommend therapy and continue amantadine per neurology follow-up with neurology in approximately 6 weeks.  2.  History of UTI-Klebsiella-she is completing a course of Keflex.  3.  Seizure disorder thought due to encephalomalacia secondary to the strokes-she is on Keppra she will need neurology follow-up as well.  4.  History of depression and anxiety thought to be probably adjustment disorder-Prozac was started her Effexor was discontinued because of concern for encephalopathy- Xanax currently on hold because of encephalopathy-.  #5 history of hypertension recommendation to continue permissive hypertension tensive control for now- her ARB  Chlorthalidone and Norvasc are being held currently with recommendation to possibly add these back over the next 5 to 7 days.  6.  History of GERD she is on a PPI.  7.  History of suspected prediabetes she is on Glucophage secondary to risk reduction-we will monitor CBGs.  Hemoglobin A1c was 6.4 in the hospital.  8.  History of hyperlipidemia again her statin was increased up to 40 mg a  day.  #9 hypokalemia potassium is slightly low at 3.4 apparently this was supplemented in the hospital magnesium was normalized-will update this possibly add low-dose potassium if still low.  [id number] note greater than  40 minutes spent assessing patient-reviewing her chart and labs- coordinating and formulating plan of care for numerous diagnoses- of note greater than 50% of time spent coordinating plan of care     5.

## 2017-06-22 NOTE — Clinical Social Work Placement (Signed)
Nurse to call report to [phone], Room [facility]  NOTE  Date:  06/22/2017  Patient Details  Name: Ebony Williams MRN: [id number] Date of Birth: 24-Jan-1951  Clinical Social Work is seeking post-discharge placement for this patient at the [place] level of care (*CSW will initial, date and re-position this form in  chart as items are completed):  Yes   Patient/family provided with [facility] Work Department's list of facilities offering this level of care within the geographic area requested by the patient (or if unable, by the patient's family).  Yes   Patient/family informed of their freedom to choose among providers that offer the needed level of care, that participate in Medicare, Medicaid or managed care program needed by the patient, have an available bed and are willing to accept the patient.  Yes   Patient/family informed of [facility]'s ownership interest in [facility] and [facility], as well as of the fact that they are under no obligation to receive care at these facilities.  PASRR submitted to EDS on       PASRR number received on       Existing PASRR number confirmed on       FL2 transmitted to all facilities in geographic area requested by pt/family on       FL2 transmitted to all facilities within larger geographic area on       Patient informed that his/her managed care company has contracts with or will negotiate with certain facilities, including the following:        Yes   Patient/family informed of bed offers received.  Patient chooses bed at [facility]     Physician recommends and patient chooses bed at      Patient to be transferred to [facility] on 06/22/17.  Patient to be transferred to facility by [facility]     Patient family notified on 06/22/17 of transfer.  Name of family member notified:  Lowella Grip     PHYSICIAN       Additional Comment:     _______________________________________________ Geralynn Ochs, LCSW 06/22/2017, 1:56 PM

## 2017-06-22 NOTE — Plan of Care (Signed)
Patient will be discharged today to rehab.  She is still weak on right side, flat of affect and intermittently disoriented, but was able to eat her meals and spend time in the chair today.  All care plans have been deemed Adequate for Discharge with appropriate progression at the rehab facility.

## 2017-06-22 NOTE — Discharge Summary (Signed)
Physician Discharge Summary  Ebony Williams BJS:[id number] DOB: 19-Mar-1950 DOA: 06/16/2017  PCP: Ebony Sites, MD  Admit date: 06/16/2017 Discharge date: 06/22/2017  Admitted From: SNF Disposition: SNF   Recommendations for Outpatient Follow-up:  1. Follow up with PCP in 1-2 weeks 2. Please obtain BMP/CBC in one week 3. Follow up with neurology in 6 weeks in stroke clinic.  Home Health: N/A Equipment/Devices: Per SNF Discharge Condition: Stable CODE STATUS: Full Diet recommendation: Heart healthy  Brief/Interim Summary: Ebony R Springsis a 67 y.o.femalewith a history of recent stroke March 2019 and forUTI 3 weeks ago, with perineal yeast infection 1 week ago, presented 5/11 with confusion from SNF. The patient was independent prior to sustaining a stroke in April 06, 2017, at the time suffering right sided weakness, left gaze and aphasia. She was treated with TPA and discharged on aspirin and plavix. Until 10 AM today she was seen her usual state of health, however her daughter came to visit around 55 and found her to be confused. She was not responding to her questions. The patient of note, had a fall yesterday, not hitting her head, but today did not ambulate, was very tearful at the emergency department, and was nonconversant. Stroke was called, NIH SS on arrival was 14,more responsive from reported. CT of the head was negative for acute findings.   Admitted with encephalopathy. Found to have evolving stroke. Also treated for UTI and keppra was started for new seizure disorder.  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 - Continue amantadine per neurology recommendations  Acute metabolic encephalopathy: Multifactorial in pt with diminished cerebral reserve due to age and CVD/CVAs. Now also suspect element of acute delirium with waxing/waning circadian course. - Avoid sedating medications, avoid rapid changes in psychotropic medications - Delirium precautions - Complete Tx for UTI and continue AEDs for seizures - Monitor closely and repeat EEG if becomes unresponsive or appears postictal.  Seizure disorder: Due to encephalomalacia related to strokes. Not in status per neurology. - Continue keppra per neurology recommendations, and follow up with neurology in 6 weeks at stroke clinic.  - Seizure precautions  Depression, anxiety, probably adjustment disorder:  - Continue prozac (changed from effexor due to concern for encephalopathy) - Holding xanax due to encephalopathy (which improved while holding this)  Klebsiella UTI:  - Converted to keflex per susceptibility data, continue through 5/18.  Hypokalemia: Improved once magnesium and potassium were replaced in concert.  - Monitor BMP  Right hemiparesis with right shoulder pain: Reportedly following a fall out of wheelchair. No bony abnormality on exam and XR of shoulder and elbow without dislocation/fracture.  - Continue PT to avoid adhesive capsulitis - Continue splinting of right hand per OT schedule.  HTN:  - Permissive HTN (holding norvasc, ARB, chlorthalidone) for now. Recommend adding these back slowly over the next 5-7 days.  GERD:  - Continue PPI  Pre-diabetes: A1c at last admission was 6.4% - Continue metformin as a form of risk reduction  HLD:  - Continue statin, increase lipitor 10mg  > 40mg   Discharge Instructions Discharge Instructions    Diet - low sodium heart healthy  Complete by:  As directed    Diet Carb Modified   Complete by:  As directed    Increase activity slowly   Complete by:  As directed       Allergies as of 06/22/2017      Reactions   Sulfa Antibiotics Diarrhea, Itching      Medication List    STOP taking these medications   ALPRAZolam 0.25 MG tablet Commonly known as:  XANAX   ALPRAZolam 0.5 MG tablet Commonly known as:  XANAX   amLODipine 10 MG tablet Commonly known as:  NORVASC   chlorthalidone 25 MG tablet Commonly known as:  HYGROTON   irbesartan 300 MG tablet Commonly known as:  AVAPRO   tiZANidine 2 MG tablet Commonly known as:  ZANAFLEX   venlafaxine XR 75 MG 24 hr capsule Commonly known as:  EFFEXOR-XR     TAKE these medications   amantadine 100 MG capsule Commonly known as:  SYMMETREL Take 2 capsules (200 mg total) by mouth 2 (two) times daily.   aspirin 81 MG chewable tablet Chew 1 tablet (81 mg total) by mouth daily.   atorvastatin 40 MG tablet Commonly known as:  LIPITOR Take 1 tablet (40 mg total) by mouth daily. What changed:    medication strength  how much to take  when to take this   cephALEXin 250 MG/5ML suspension Commonly known as:  KEFLEX Take 10 mLs (500 mg total) by mouth every 12 (twelve) hours for 1 day.   clopidogrel 75 MG tablet Commonly known as:  PLAVIX Take 1 tablet (75 mg total) by mouth daily. What changed:  when to take this   FLUoxetine 40 MG capsule Commonly known as:  PROZAC Take 1 capsule (40 mg total) by mouth at bedtime.   levETIRAcetam 1000 MG tablet Commonly known as:  KEPPRA Take 1 tablet (1,000 mg total) by mouth 2 (two) times daily.   metFORMIN 500 MG tablet Commonly known as:  GLUCOPHAGE Take 1 tablet (500 mg total) by mouth daily with breakfast.   nystatin powder Generic drug:  nystatin Apply topically See admin instructions. Apply topically to groin and outer vaginal area daily after bath and twice daily as needed for rash/irritation   ondansetron 4 MG tablet Commonly known as:  ZOFRAN Take 4 mg by mouth every 8 (eight) hours as needed for nausea or vomiting.   pantoprazole 40  MG tablet Commonly known as:  PROTONIX Take 1 tablet (40 mg total) by mouth daily.   polyethylene glycol packet Commonly known as:  MIRALAX / GLYCOLAX Take 17 g by mouth daily. Mix with 8-10 oz water and drink   PRESCRIPTION MEDICATION Inhale into the lungs at bedtime. CPAP       Contact information for follow-up providers    Ebony Sites, MD Follow up.   Specialty:  Family Medicine Contact information: [address] [place] [address] [phone]        Garvin Fila, MD. Schedule an appointment as soon as possible for a visit in 6 week(s).   Specialties:  Neurology, Radiology Contact information: [address] [place] [address] [phone]            Contact information for after-discharge care    [facility] .   Service:  Skilled Nursing Contact information: [address] [place] [address] [phone]                 Allergies  Allergen Reactions  . Sulfa Antibiotics Diarrhea and Itching    Consultations:  Neurology, Leonie Man  CCM, Agarwal  Procedures/Studies: Ct Angio Head W Or Wo Contrast  Result Date: 06/16/2017 CLINICAL DATA:  Focal neurological deficit. Speech disturbance. Last seen normal 1000 hours. EXAM: CT ANGIOGRAPHY HEAD AND NECK TECHNIQUE: Multidetector CT imaging of the head and neck was performed using the standard protocol during bolus administration of intravenous contrast. Multiplanar CT image reconstructions and MIPs were obtained to evaluate the vascular anatomy. Carotid stenosis measurements (when applicable) are obtained utilizing NASCET criteria, using the distal internal carotid diameter as the denominator. CONTRAST:  71mL ISOVUE-370 IOPAMIDOL (ISOVUE-370) INJECTION 76% COMPARISON:  CT earlier same day. Multiple previous neuro imaging studies this year. FINDINGS: CTA NECK FINDINGS Aortic arch: Aortic atherosclerosis. Anomalous branching pattern  with the right subclavian artery being the last vessel arising from the arch. 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 Right carotid system: Common carotid artery widely patent to the bifurcation. No carotid bifurcation atherosclerotic disease. Cervical ICA widely patent. Left carotid system: Common carotid artery widely patent to the bifurcation. Minimal atherosclerotic plaque at the bifurcation but no stenosis or irregularity. Cervical ICA widely patent. Vertebral arteries: Both vertebral arteries widely patent at their origins and through the cervical region to the foramen magnum. Skeleton: Minimal cervical spondylosis. 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 Other neck: No mass or lymphadenopathy. Upper chest: Negative Review of the MIP images confirms the above findings CTA HEAD FINDINGS Anterior circulation: Both internal carotid arteries are patent through the skull base and siphon regions. Peripheral atherosclerotic calcification in the carotid siphon regions without stenosis more than about 25%. Cervical internal carotid arteries widely patent. The anterior and middle cerebral vessels are patent without proximal stenosis, aneurysm or vascular malformation. No missing distal branch vessels are identified. Posterior circulation: Both vertebral arteries are widely patent to the basilar. No basilar stenosis. Fenestrated basilar artery incidentally noted. Posterior circulation branch vessels appear normal. Venous sinuses: Patent and normal. Anatomic variants: None significant. Delayed phase: Abnormal enhancement. Review of the MIP images confirms the above findings IMPRESSION: No intracranial large or medium vessel occlusion identified. Atherosclerotic tortuosity of the aorta. 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 Minimal atherosclerotic change at the left carotid bifurcation but no stenosis. These results were called by telephone at the time of  interpretation on 06/16/2017 at 2:00 pm to Dr. Kerney Elbe , who verbally acknowledged these results. Electronically Signed   By: Nelson Chimes M.D.   On: 06/16/2017 14:03   Dg Shoulder Right  Result Date: 06/20/2017 CLINICAL DATA:  Status post fall.  History of previous CVA. EXAM: RIGHT SHOULDER - 2+ VIEW COMPARISON:  Limited views of the right shoulder from a chest x-ray of April 16, 2017. FINDINGS: The bones of the right shoulder are subjectively adequately mineralized. There is no acute fracture or dislocation. There is subjective mild narrowing of the glenohumeral joint. The [facility] joint is reasonably well-maintained. The subacromial subdeltoid space is normal. IMPRESSION: There is no acute bony abnormality of the right shoulder. I can't exclude mild degenerative narrowing of the glenohumeral joint. Electronically Signed   By: David  Martinique M.D.   On: 06/20/2017 13:22   Dg Elbow 2 Views Right  Result Date: 06/20/2017 CLINICAL DATA:  Status post fall.  History of previous CVA EXAM: RIGHT ELBOW - 2 VIEW COMPARISON:  None in PACs FINDINGS: The bones of the elbow are subjectively adequately mineralized. There is no acute  fracture nor dislocation. There is no joint effusion. The overlying soft tissues exhibit no acute abnormalities. IMPRESSION: There is no acute or significant chronic bony abnormality of the right elbow. Electronically Signed   By: David  Martinique M.D.   On: 06/20/2017 13:17   Ct Angio Neck W Or Wo Contrast  Result Date: 06/16/2017 CLINICAL DATA:  Focal neurological deficit. Speech disturbance. Last seen normal 1000 hours. EXAM: CT ANGIOGRAPHY HEAD AND NECK TECHNIQUE: Multidetector CT imaging of the head and neck was performed using the standard protocol during bolus administration of intravenous contrast. Multiplanar CT image reconstructions and MIPs were obtained to evaluate the vascular anatomy. Carotid stenosis measurements (when applicable) are obtained utilizing NASCET criteria, using the  distal internal carotid diameter as the denominator. CONTRAST:  16mL ISOVUE-370 IOPAMIDOL (ISOVUE-370) INJECTION 76% COMPARISON:  CT earlier same day. Multiple previous neuro imaging studies this year. FINDINGS: CTA NECK FINDINGS Aortic arch: Aortic atherosclerosis. 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 Right carotid system: Common carotid artery widely patent to the bifurcation. No carotid bifurcation atherosclerotic disease. Cervical ICA widely patent. Left carotid system: Common carotid artery widely patent to the bifurcation. Minimal atherosclerotic plaque at the bifurcation but no stenosis or irregularity. Cervical ICA widely patent. Vertebral arteries: Both vertebral arteries widely patent at their origins and through the cervical region to the foramen magnum. Skeleton: Minimal cervical spondylosis. 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 Other neck: No mass or lymphadenopathy. Upper chest: Negative Review of the MIP images confirms the above findings CTA HEAD FINDINGS Anterior circulation: Both internal carotid arteries are patent through the skull base and siphon regions. Peripheral atherosclerotic calcification in the carotid siphon regions without stenosis more than about 25%. Cervical internal carotid arteries widely patent. The anterior and middle cerebral vessels are patent without proximal stenosis, aneurysm or vascular malformation. No missing distal branch vessels are identified. Posterior circulation: Both vertebral arteries are widely patent to the basilar. No basilar stenosis. Fenestrated basilar artery incidentally noted. Posterior circulation branch vessels appear normal. Venous sinuses: Patent and normal. Anatomic variants: None significant. Delayed phase: Abnormal enhancement. Review of the MIP images confirms the above findings IMPRESSION: No intracranial large or  medium vessel occlusion identified. Atherosclerotic tortuosity of the aorta. 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 Minimal atherosclerotic change at the left carotid bifurcation but no stenosis. These results were called by telephone at the time of interpretation on 06/16/2017 at 2:00 pm to Dr. Kerney Elbe , who verbally acknowledged these results. Electronically Signed   By: Nelson Chimes M.D.   On: 06/16/2017 14:03   Mr Jeri Cos And Wo Contrast  Result Date: 06/16/2017 CLINICAL DATA:  67 year old female with abnormal speech. Left MCA and PCA territory infarcts in March of this year. EXAM: MRI HEAD WITHOUT AND WITH CONTRAST TECHNIQUE: Multiplanar, multiecho pulse sequences of the brain and surrounding structures were obtained without and with intravenous contrast. CONTRAST:  11mL MULTIHANCE GADOBENATE DIMEGLUMINE 529 MG/ML IV SOLN COMPARISON:  Head CT and CTA head and neck earlier today. Brain MRI 04/07/2017. FINDINGS: Brain: Continued confluent diffusion abnormality in the left basal ganglia since March with superimposed developing cystic encephalomalacia which is currently most pronounced in the left caudate. Intrinsic T1 hyperintensity has developed in this area compatible with petechial hemorrhage and/or laminar necrosis, although there is mild superimposed post ischemic enhancement. The remaining scattered left MCA territory cortical diffusion  abnormality has resolved since March, with cortical and cystic encephalomalacia in the superior left parietal lobe including portions of the sensory strip. There is minimal post ischemic enhancement. There also remains some residual confluent diffusion abnormality in the lateral left occipital lobe, although the majority of the parenchymal diffusion restriction has resolved. Similar intrinsic T1 cortical laminar necrosis is noted at that site with developing T2 and FLAIR hyperintense encephalomalacia, and petechial  hemorrhage. Ex vacuo enlargement of the left occipital horn has begun. There is also patchy post ischemic enhancement. No new area of restricted diffusion today. No contralateral or posterior fossa diffusion abnormality. No midline shift, evidence of mass lesion, ventriculomegaly, extra-axial collection or malignant intracranial hemorrhage. Outside of the above findings confluent bilateral cerebral white matter T2 and FLAIR hyperintensity is stable. T2 heterogeneity in the bilateral medial thalami has not definitely changed. Patchy T2 hyperintensity throughout much of the pons also is felt to be stable. No other abnormal intracranial enhancement.  No dural thickening. Cervicomedullary junction and pituitary are within normal limits. Vascular: Major intracranial vascular flow voids are preserved. The major dural venous sinuses are enhancing and appear patent. Skull and upper cervical spine: Negative visible cervical spine and spinal cord. Visualized bone marrow signal is within normal limits. Sinuses/Orbits: Normal orbits soft tissues. Bilateral paranasal sinus disease has largely resolved. Other: Mastoid air cells remain clear. Visible internal auditory structures appear normal. Scalp and face soft tissues appear negative. IMPRESSION: 1. Evolving ischemia with petechial hemorrhage, laminar necrosis, and developing encephalomalacia in the left MCA and PCA parenchyma affected on 04/07/2017. Continued diffusion abnormality in the left basal ganglia and portions of the left occipital lobe. Post ischemic enhancement. 2. Underlying advanced chronic small vessel disease suspected. Pronounced chronic appearing signal changes in the medial thalami and pons. 3. No new intracranial ischemia or new intracranial abnormality identified. Electronically Signed   By: Genevie Ann M.D.   On: 06/16/2017 18:00   Dg Chest Portable 1 View  Result Date: 06/16/2017 CLINICAL DATA:  Stroke, recent UTI in [place] infection, fell yesterday, mental  status change, history kidney stones and hypertension EXAM: PORTABLE CHEST 1 VIEW COMPARISON:  Portable exam 1540 hours compared to 04/16/2017 FINDINGS: Normal heart size, mediastinal contours, and pulmonary vascularity. Minimal LEFT basilar atelectasis decreased from previous exam. Lungs otherwise clear. No acute infiltrate, pleural effusion or pneumothorax. Bones appear demineralized. IMPRESSION: Mild LEFT basilar atelectasis. Electronically Signed   By: Lavonia Dana M.D.   On: 06/16/2017 15:51   Ct Head Code Stroke Wo Contrast  Result Date: 06/16/2017 CLINICAL DATA:  Code stroke. Speech disturbance. Altered mental status. Last seen normal 1000 hours. EXAM: CT HEAD WITHOUT CONTRAST TECHNIQUE: Contiguous axial images were obtained from the base of the skull through the vertex without intravenous contrast. COMPARISON:  04/16/2017 FINDINGS: Brain: Question newly seen infarction in the left side of the pons. No focal cerebellar finding. Late subacute phase infarctions in the left occipital lobe, left basal ganglia and deep white matter show expected evolutionary changes. Chronic small-vessel ischemic changes seen elsewhere. No sign of hemorrhage. No hydrocephalus or extra-axial collection. Vascular: There is atherosclerotic calcification of the major vessels at the base of the brain. Skull: Negative Sinuses/Orbits: Clear/normal Other: None ASPECTS ([place] Stroke Program Early CT Score) - Ganglionic level infarction (caudate, lentiform nuclei, internal capsule, insula, M1-M3 cortex): 7 - Supraganglionic infarction (M4-M6 cortex): 3 Total score (0-10 with 10 being normal): 10 IMPRESSION: 1. Question newly seen low-density in the left side of the pons, which could represent a  pontine infarction. 2. Late subacute infarctions in the left occipital lobe and in the left basal ganglia and deep white matter show expected evolutionary changes. 3. Extensive chronic small-vessel ischemic changes elsewhere throughout the  hemispheric white matter. 4. ASPECTS is 10. 5. These results were communicated to Dr. Cheral Marker at [place] 5/11/2019by text page via the [facility] messaging system. Electronically Signed   By: Nelson Chimes M.D.   On: 06/16/2017 13:46    24 hour continuous EEG 06/19/17 :abnormal EEG due to the presence of the following: 1) Background delta and theta slowing, suggesting moderate to severe encephalopathy, which is nonspecific as to etiology; 2) Prominent focal polymorphic delta slowing in the left hemisphere, suggesting left hemispheric dysfunction and/or structural lesion, consistent with patient's history of stroke; 3) Frequent epileptiform discharges in the left posterior temporoparietal region,whichwaxed and waned andoftentimes appeared semi-periodic, concerning for intermittent focal electrographic seizures.  Subjective: Mental status has overall improved, speaking in full sentences now. No events overnight. No new deficits, ready to leave the hospital.  Discharge Exam: Vitals:   06/22/17 0820 06/22/17 1154  BP: (!) 151/81 (!) 129/56  Pulse: 76 72  Resp: 20 20  Temp: 98.2 F (36.8 C) 98.5 F (36.9 C)  SpO2: 98% 99%   General: No distress Cardiovascular: RRR, S1/S2 +, no rubs, no gallops Respiratory: CTA bilaterally, no wheezing, no rhonchi Ext: Spastic right hemiparesis with right hand contractures in splint. Right shoulder without visible deformity or palpable deformity/tenderness. 1+ pitting edema to right foot and hand which is stable. Neuro: Interactive, cooperative with exam. Hemiparesis as above, stable. Psych: Judgement and insight appear fair, limited by inattention. Mood depressed.  Labs: BNP (last 3 results) No results for input(s): BNP in the last 8760 hours. Basic Metabolic Panel: Recent Labs  Lab 06/16/17 1848  06/18/17 0754 06/19/17 0616 06/20/17 0217 06/21/17 0441 06/22/17 0342  NA  --    < > 139 138 140 141 141  K  --    < > 3.1* 4.2 2.8* 2.9* 3.4*  CL  --     < > 104 103 106 106 109  CO2  --    < > 25 22 23 23 24   GLUCOSE  --    < > 125* 121* 113* 116* 114*  BUN  --    < > 6 6 <5* <5* <5*  CREATININE  --    < > 0.51 0.49 0.46 0.48 0.46  CALCIUM  --    < > 9.0 8.7* 8.9 8.9 8.9  MG 2.0  --   --   --   --  1.4* 1.9   < > = values in this interval not displayed.   Liver Function Tests: Recent Labs  Lab 06/16/17 1459  AST 29  ALT 26  ALKPHOS 82  BILITOT 1.2  PROT 6.8  ALBUMIN 3.6   No results for input(s): LIPASE, AMYLASE in the last 168 hours. No results for input(s): AMMONIA in the last 168 hours. CBC: Recent Labs  Lab 06/16/17 1330 06/16/17 1453 06/17/17 0515 06/18/17 0754 06/19/17 0616 06/20/17 0357  WBC 11.9*  --  10.7* 6.8 8.4 6.4  NEUTROABS 7.8*  --   --   --   --   --   HGB 16.3* 15.3* 15.1* 14.8 15.4* 14.1  HCT 45.4 45.0 43.9 42.7 43.5 40.7  MCV 89.2  --  91.1 90.1 88.8 88.5  PLT 347  --  289 253 265 255   Cardiac Enzymes: No results for  input(s): CKTOTAL, CKMB, CKMBINDEX, TROPONINI in the last 168 hours. BNP: Invalid input(s): POCBNP CBG: Recent Labs  Lab 06/21/17 1128 06/21/17 1646 06/21/17 2157 06/22/17 0616 06/22/17 1152  GLUCAP 108* 150* 111* 106* 129*   D-Dimer No results for input(s): DDIMER in the last 72 hours. Hgb A1c No results for input(s): HGBA1C in the last 72 hours. Lipid Profile No results for input(s): CHOL, HDL, LDLCALC, TRIG, CHOLHDL, LDLDIRECT in the last 72 hours. Thyroid function studies No results for input(s): TSH, T4TOTAL, T3FREE, THYROIDAB in the last 72 hours.  Invalid input(s): FREET3 Anemia work up No results for input(s): VITAMINB12, FOLATE, FERRITIN, TIBC, IRON, RETICCTPCT in the last 72 hours. Urinalysis    Component Value Date/Time   COLORURINE YELLOW 06/16/2017 [place] 06/16/2017 1508   LABSPEC >1.046 (H) 06/16/2017 1508   PHURINE 6.0 06/16/2017 1508   GLUCOSEU NEGATIVE 06/16/2017 1508   HGBUR NEGATIVE 06/16/2017 1508   BILIRUBINUR NEGATIVE  06/16/2017 1508   KETONESUR 5 (A) 06/16/2017 1508   PROTEINUR NEGATIVE 06/16/2017 1508   NITRITE NEGATIVE 06/16/2017 1508   LEUKOCYTESUR SMALL (A) 06/16/2017 1508    Microbiology Recent Results (from the past 240 hour(s))  Urine culture     Status: Abnormal   Collection Time: 06/16/17  4:45 PM  Result Value Ref Range Status   Specimen Description URINE, RANDOM  Final   Special Requests   Final    NONE Performed at [facility], [place] [address] [place], [place] [address]    Culture >=100,000 COLONIES/mL KLEBSIELLA PNEUMONIAE (A)  Final   Report Status 06/19/2017 FINAL  Final   Organism ID, Bacteria KLEBSIELLA PNEUMONIAE (A)  Final      Susceptibility   Klebsiella pneumoniae - MIC*    AMPICILLIN RESISTANT Resistant     CEFAZOLIN <=4 SENSITIVE Sensitive     CEFTRIAXONE <=1 SENSITIVE Sensitive     CIPROFLOXACIN <=0.25 SENSITIVE Sensitive     GENTAMICIN <=1 SENSITIVE Sensitive     IMIPENEM <=0.25 SENSITIVE Sensitive     NITROFURANTOIN 64 INTERMEDIATE Intermediate     TRIMETH/SULFA <=20 SENSITIVE Sensitive     AMPICILLIN/SULBACTAM 4 SENSITIVE Sensitive     PIP/TAZO <=4 SENSITIVE Sensitive     Extended ESBL NEGATIVE Sensitive     * >=100,000 COLONIES/mL KLEBSIELLA PNEUMONIAE  MRSA PCR Screening     Status: None   Collection Time: 06/16/17  6:51 PM  Result Value Ref Range Status   MRSA by PCR NEGATIVE NEGATIVE Final    Comment:        The GeneXpert MRSA Assay (FDA approved for NASAL specimens only), is one component of a comprehensive MRSA colonization surveillance program. It is not intended to diagnose MRSA infection nor to guide or monitor treatment for MRSA infections. Performed at [facility], [place] [address], [place], [place] [address]     Time coordinating discharge: Approximately 40 minutes  Patrecia Pour, MD  [facility] 06/22/2017, 12:36 PM Pager [phone]

## 2017-06-22 NOTE — Care Management Note (Signed)
Case Management Note  Patient Details  Name: BRITTAY MOGLE MRN: [id number] Date of Birth: 03-18-1950  Subjective/Objective:                    Action/Plan: Pt discharging to [facility] today. CM signing off.   Expected Discharge Date:  06/22/17               Expected Discharge Plan:  Skilled Nursing Facility  In-House Referral:  Clinical Social Work  Discharge planning Services     Post Acute Care Choice:    Choice offered to:     DME Arranged:    DME Agency:     HH Arranged:    [place] Agency:     Status of Service:  Completed, signed off  If discussed at [employer], dates discussed:    Additional Comments:  Pollie Friar, RN 06/22/2017, 1:39 PM

## 2017-06-22 NOTE — Progress Notes (Signed)
Physical Therapy Treatment Patient Details Name: Ebony Williams MRN: [id number] DOB: 1950/02/27 Today's Date: 06/22/2017    History of Present Illness Pt is a 67 y.o. female with recent UTI admitted 06/16/17 with AMS. Of note, admitted 04/06/17 with L MCA and PCA parenchyma infarcts with discharge to CIR then SNF. MRI 5/11 shows no new intracrnial abnormality; continued diffusion abnormality in L basal ganglia and L occipital lobe. PMH includes HTN.    PT Comments    Patient seen for mobility progression and to assess use of AD. Pt limited by R UE pain with movement in all places and R LE with weight bearing. Patient requires +2 assist for functional transfers. Continue to progress as tolerated with anticipated d/c to SNF for further skilled PT services.     Follow Up Recommendations  SNF;Supervision/Assistance - 24 hour     Equipment Recommendations  Other (comment)(TBA)    Recommendations for Other Services       Precautions / Restrictions Precautions Precautions: Fall    Mobility  Bed Mobility Overal bed mobility: Needs Assistance Bed Mobility: Supine to Sit     Supine to sit: Mod assist;HOB elevated;+2 for safety/equipment     General bed mobility comments: pt initiated task with vc using L side bed rail and assisting with L UE/LE; assistance needed to bring R LE to EOB and to elevate trunk all the way into sitting  Transfers Overall transfer level: Needs assistance Equipment used: Hemi-walker Transfers: Stand Pivot Transfers;Sit to/from Stand Sit to Stand: Mod assist;+2 physical assistance;From elevated surface Stand pivot transfers: Max assist;+2 physical assistance       General transfer comment: pt stood X2 from EOB using hemi walker and with R knee blocked; pt requires assistance to mobilize R LE and limited by pain with weight bearing; pt did well with managing hemi walker without assist  Ambulation/Gait                 Stairs              Wheelchair Mobility    Modified Rankin (Stroke Patients Only) Modified Rankin (Stroke Patients Only) Pre-Morbid Rankin Score: Moderately severe disability Modified Rankin: Severe disability     Balance Overall balance assessment: Needs assistance Sitting-balance support: Single extremity supported;Feet supported Sitting balance-Leahy Scale: Fair     Standing balance support: Single extremity supported;During functional activity Standing balance-Leahy Scale: Poor                              Cognition Arousal/Alertness: Awake/alert Behavior During Therapy: Flat affect Overall Cognitive Status: History of cognitive impairments - at baseline                                        Exercises General Exercises - Upper Extremity Shoulder Flexion: AROM;Left;PROM;Both(limited ROM R UE) Shoulder Extension: AROM;Left Shoulder Horizontal ABduction: AROM;Left Shoulder Horizontal ADduction: AROM;Left Elbow Flexion: AROM;Left;PROM;Right(limited ROM R UE) General Exercises - Lower Extremity Ankle Circles/Pumps: AROM;Left;20 reps Long Arc Quad: AROM;Left;Seated Hip Flexion/Marching: AROM;Left;Seated Other Exercises Other Exercises: PROM of R LE and heel cord stretch    General Comments        Pertinent Vitals/Pain Pain Assessment: Faces Faces Pain Scale: Hurts even more Pain Location: R UE with movement in all planes Pain Descriptors / Indicators: Grimacing;Guarding Pain Intervention(s): Limited activity within patient's tolerance;Monitored during session;Repositioned  Home Living                      Prior Function            PT Goals (current goals can now be found in the care plan section) Acute Rehab PT Goals Patient Stated Goal: Return to rehab PT Goal Formulation: With patient/family Time For Goal Achievement: 07/01/17 Potential to Achieve Goals: Good Progress towards PT goals: Progressing toward goals     Frequency    Min 3X/week      PT Plan Current plan remains appropriate    Co-evaluation              AM-PAC PT "6 Clicks" Daily Activity  Outcome Measure  Difficulty turning over in bed (including adjusting bedclothes, sheets and blankets)?: Unable Difficulty moving from lying on back to sitting on the side of the bed? : Unable Difficulty sitting down on and standing up from a chair with arms (e.g., wheelchair, bedside commode, etc,.)?: Unable Help needed moving to and from a bed to chair (including a wheelchair)?: A Lot Help needed walking in hospital room?: A Lot Help needed climbing 3-5 steps with a railing? : Total 6 Click Score: 8    End of Session Equipment Utilized During Treatment: Gait belt Activity Tolerance: Patient tolerated treatment well Patient left: with call bell/phone within reach;in chair;with chair alarm set;with family/visitor present Nurse Communication: Mobility status PT Visit Diagnosis: Other abnormalities of gait and mobility (R26.89);Other symptoms and signs involving the nervous system (R29.898);Hemiplegia and hemiparesis Hemiplegia - Right/Left: Right Hemiplegia - dominant/non-dominant: Dominant     Time: 1009-1040 PT Time Calculation (min) (ACUTE ONLY): 31 min  Charges:  $Therapeutic Exercise: 8-22 mins $Therapeutic Activity: 8-22 mins                    G Codes:       Earney Navy, PTA Pager: [phone]     Darliss Cheney 06/22/2017, 11:49 AM

## 2017-06-23 ENCOUNTER — Encounter (HOSPITAL_COMMUNITY)
Admission: RE | Admit: 2017-06-23 | Discharge: 2017-06-23 | Disposition: A | Discharge status: Home / Self Care | Payer: Medicare Other | Source: Skilled Nursing Facility | Attending: Internal Medicine | Admitting: Internal Medicine

## 2017-06-23 DIAGNOSIS — B961 Klebsiella pneumoniae [K. pneumoniae] as the cause of diseases classified elsewhere: Secondary | ICD-10-CM | POA: Insufficient documentation

## 2017-06-23 LAB — CBC WITH DIFFERENTIAL/PLATELET
BASOS PCT: 0 %
Basophils Absolute: 0 10*3/uL (ref 0.0–0.1)
Eosinophils Absolute: 0.1 10*3/uL (ref 0.0–0.7)
Eosinophils Relative: 2 %
HEMATOCRIT: 39.3 % (ref 36.0–46.0)
Hemoglobin: 13.5 g/dL (ref 12.0–15.0)
LYMPHS ABS: 2.1 10*3/uL (ref 0.7–4.0)
LYMPHS PCT: 29 %
MCH: 31.3 pg (ref 26.0–34.0)
MCHC: 34.4 g/dL (ref 30.0–36.0)
MCV: 91 fL (ref 78.0–100.0)
MONO ABS: 0.5 10*3/uL (ref 0.1–1.0)
MONOS PCT: 7 %
NEUTROS ABS: 4.5 10*3/uL (ref 1.7–7.7)
NEUTROS PCT: 62 %
Platelets: 253 10*3/uL (ref 150–400)
RBC: 4.32 MIL/uL (ref 3.87–5.11)
RDW: 13.3 % (ref 11.5–15.5)
WBC: 7.2 10*3/uL (ref 4.0–10.5)

## 2017-06-23 LAB — BASIC METABOLIC PANEL
Anion gap: 10 (ref 5–15)
BUN: 8 mg/dL (ref 6–20)
CALCIUM: 9.3 mg/dL (ref 8.9–10.3)
CO2: 25 mmol/L (ref 22–32)
CREATININE: 0.49 mg/dL (ref 0.44–1.00)
Chloride: 103 mmol/L (ref 101–111)
Glucose, Bld: 118 mg/dL — ABNORMAL HIGH (ref 65–99)
Potassium: 3.4 mmol/L — ABNORMAL LOW (ref 3.5–5.1)
SODIUM: 138 mmol/L (ref 135–145)

## 2017-06-24 ENCOUNTER — Encounter: Payer: Self-pay | Admitting: Internal Medicine

## 2017-06-25 ENCOUNTER — Encounter (HOSPITAL_COMMUNITY)
Admission: RE | Admit: 2017-06-25 | Discharge: 2017-06-25 | Disposition: A | Discharge status: Home / Self Care | Payer: Medicare Other | Source: Skilled Nursing Facility | Attending: Internal Medicine | Admitting: Internal Medicine

## 2017-06-25 ENCOUNTER — Non-Acute Institutional Stay (SKILLED_NURSING_FACILITY): Payer: Medicare Other | Admitting: Internal Medicine

## 2017-06-25 ENCOUNTER — Encounter: Payer: Self-pay | Admitting: Internal Medicine

## 2017-06-25 DIAGNOSIS — F331 Major depressive disorder, recurrent, moderate: Secondary | ICD-10-CM | POA: Diagnosis not present

## 2017-06-25 DIAGNOSIS — I63512 Cerebral infarction due to unspecified occlusion or stenosis of left middle cerebral artery: Secondary | ICD-10-CM | POA: Insufficient documentation

## 2017-06-25 DIAGNOSIS — I10 Essential (primary) hypertension: Secondary | ICD-10-CM | POA: Insufficient documentation

## 2017-06-25 DIAGNOSIS — I69320 Aphasia following cerebral infarction: Secondary | ICD-10-CM | POA: Insufficient documentation

## 2017-06-25 DIAGNOSIS — R4701 Aphasia: Secondary | ICD-10-CM | POA: Diagnosis not present

## 2017-06-25 DIAGNOSIS — I69351 Hemiplegia and hemiparesis following cerebral infarction affecting right dominant side: Secondary | ICD-10-CM | POA: Insufficient documentation

## 2017-06-25 LAB — BASIC METABOLIC PANEL
ANION GAP: 10 (ref 5–15)
BUN: 10 mg/dL (ref 6–20)
CHLORIDE: 101 mmol/L (ref 101–111)
CO2: 27 mmol/L (ref 22–32)
Calcium: 9.2 mg/dL (ref 8.9–10.3)
Creatinine, Ser: 0.53 mg/dL (ref 0.44–1.00)
GFR calc Af Amer: >60 mL/min (ref >60)
Glucose, Bld: 115 mg/dL — ABNORMAL HIGH (ref 65–99)
POTASSIUM: 3.8 mmol/L (ref 3.5–5.1)
SODIUM: 138 mmol/L (ref 135–145)

## 2017-06-25 NOTE — Progress Notes (Addendum)
Provider:  Veleta Miners Location:   [place] Room Number: 136/P Place of Service:  SNF (31)  PCP: Sharilyn Sites, MD Patient Care Team: Sharilyn Sites, MD as PCP - General [facility] Medicine)  Extended Emergency Contact Information Primary Emergency Contact: Zale,Elvin Address: [address]          [place], [place] [address] Johnnette Litter of [place] Phone: [phone] Relation: Spouse Secondary Emergency Contact: Anson Fret [place] of [place] Phone: [phone] Relation: Daughter  Code Status: Full Code Goals of Care: Advanced Directive information Advanced Directives 06/25/2017  Does Patient Have a Medical Advance Directive? Yes  Type of Advance Directive (No Data)  Does patient want to make changes to medical advance directive? No - Patient declined  Copy of [place] in Chart? -  Would patient like information on creating a medical advance directive? -      Chief Complaint  Patient presents with  . New Admit To SNF    New Admission Visit    HPI: Patient is a 67 y.o. female seen today for admission to SNF for therapy.  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 She was discharged on 03/06 to Acute inpatient Rehab. From there patient was discharged to SNF . She was again admitted to Hospital on 05/11 with Mental status changes. Her Daughter found her to be non responsive. She was admitted and underwent extensive test including repeat MRI which showed new diffusion-positive area adjacent to the recent left basal ganglia and posterior parietal infarcts. She also had UTI which was treated with Antibitocs. Her EEG showed ? Possible Seizure activity.without any clinical Significance She was also started on Kepprra. She did come back to her Baseline.  She continues to have  significant Aphasia and Right sided Weakness. Did not have any new complains. She does not remember much from her hospitalization. She lived with her husband before initial stroke. Was driving and was completely iNdependent.        Past Medical History:  Diagnosis Date  . History of kidney stones    x1  . Hypertension    Past Surgical History:  Procedure Laterality Date  . CHOLECYSTECTOMY     laparoscopic  . COLONOSCOPY WITH PROPOFOL N/A 01/12/2015   Procedure: COLONOSCOPY WITH PROPOFOL;  Surgeon: Garlan Fair, MD;  Location: [facility];  Service: Endoscopy;  Laterality: N/A;  . TUBAL LIGATION      reports that she has never smoked. She has never used smokeless tobacco. She reports that she drinks alcohol. She reports that she does not use drugs. Social History   Socioeconomic History  . Marital status: Married    Spouse name: Not on file  . Number of children: Not on file  . Years of education: Not on file  . Highest education level: Not on file  Occupational History  . Not on file  Social Needs  . Financial resource strain: Not on file  . Food insecurity:    Worry: Not on file    Inability: Not on file  . Transportation needs:    Medical: Not on file    Non-medical: Not on file  Tobacco Use  . Smoking status: Never Smoker  . Smokeless tobacco: Never Used  Substance and Sexual Activity  . Alcohol use: Yes    Comment: very rare  . Drug use: No  .  Sexual activity: Not on file  Lifestyle  . Physical activity:    Days per week: Not on file    Minutes per session: Not on file  . Stress: Not on file  Relationships  . Social connections:    Talks on phone: Not on file    Gets together: Not on file    Attends religious service: Not on file    Active member of club or organization: Not on file    Attends meetings of clubs or organizations: Not on file    Relationship status: Not on file  . Intimate partner violence:    Fear of current or ex partner: Not  on file    Emotionally abused: Not on file    Physically abused: Not on file    Forced sexual activity: Not on file  Other Topics Concern  . Not on file  Social History Narrative  . Not on file    Functional Status Survey:    Family History  Problem Relation Age of Onset  . Diabetes Mother   . Hypertension Mother     Health Maintenance  Topic Date Due  . TETANUS/TDAP  07/26/2017 (Originally 03/04/1967)  . Hepatitis C Screening  07/26/2017 (Originally 07-Dec-1950)  . PNA vac Low Risk Adult (1 of 2 - PCV13) 07/26/2017 (Originally 03/03/2013)  . INFLUENZA VACCINE  09/06/2017  . MAMMOGRAM  10/11/2018  . COLONOSCOPY  01/11/2025  . DEXA SCAN  Completed    Allergies  Allergen Reactions  . Sulfa Antibiotics Diarrhea and Itching    Allergies as of 06/25/2017      Reactions   Sulfa Antibiotics Diarrhea, Itching      Medication List    Notice   This visit is during an admission. Changes to the med list made in this visit will be reflected in the After Visit Summary of the admission.     Review of Systems  Review of Systems  Constitutional: Negative for activity change, appetite change, chills, diaphoresis, fatigue and fever.  HENT: Negative for mouth sores, postnasal drip, rhinorrhea, sinus pain and sore throat.   Respiratory: Negative for apnea, cough, chest tightness, shortness of breath and wheezing.   Cardiovascular: Negative for chest pain, palpitations and leg swelling.  Gastrointestinal: Negative for abdominal distention, abdominal pain, constipation, diarrhea, nausea and vomiting.  Genitourinary: Negative for dysuria and frequency.  Musculoskeletal: Negative for arthralgias, joint swelling and myalgias.  Skin: Negative for rash.  Neurological: Negative for dizziness, syncope, weakness, light-headedness and numbness.  Psychiatric/Behavioral: Negative for behavioral problems, confusion and sleep disturbance.     Vitals:   06/25/17 1254  BP: 130/82  Pulse: 72    Resp: 20  Temp: (!) 97.4 F (36.3 C)  SpO2: 95%   There is no height or weight on file to calculate BMI. Physical Exam  Constitutional: She is oriented to person, place, and time. She appears well-developed and well-nourished.  HENT:  Head: Normocephalic.  Thrush in mouth  Eyes: Pupils are equal, round, and reactive to light. EOM are normal.  Neck: Normal range of motion. Neck supple.  Cardiovascular: Normal rate and regular rhythm.  No murmur heard. Pulmonary/Chest: Effort normal and breath sounds normal. No stridor. No respiratory distress. She has no wheezes.  Abdominal: Soft. Bowel sounds are normal. She exhibits no distension. There is no tenderness. There is no guarding.  Musculoskeletal: She exhibits no edema.  Neurological: She is alert and oriented to person, place, and time.  Have Aphasia but still was Naming some  objects well. Right dense Hemiparesis with 1/5 in UE and 0/5 in LE.  Skin: Skin is warm and dry.  Psychiatric: She has a normal mood and affect. Her behavior is normal. Thought content normal.    Labs reviewed: Basic Metabolic Panel: Recent Labs    06/16/17 1848  06/21/17 0441 06/22/17 0342 06/23/17 0900 06/25/17 0700  NA  --    < > 141 141 138 138  K  --    < > 2.9* 3.4* 3.4* 3.8  CL  --    < > 106 109 103 101  CO2  --    < > 23 24 25 27   GLUCOSE  --    < > 116* 114* 118* 115*  BUN  --    < > <5* <5* 8 10  CREATININE  --    < > 0.48 0.46 0.49 0.53  CALCIUM  --    < > 8.9 8.9 9.3 9.2  MG 2.0  --  1.4* 1.9  --   --    < > = values in this interval not displayed.   Liver Function Tests: Recent Labs    04/20/17 1048 04/25/17 0459 06/16/17 1459  AST 61* 31 29  ALT 67* 43 26  ALKPHOS 78 82 82  BILITOT 0.9 0.9 1.2  PROT 7.9 6.9 6.8  ALBUMIN 3.4* 3.2* 3.6   No results for input(s): LIPASE, AMYLASE in the last 8760 hours. No results for input(s): AMMONIA in the last 8760 hours. CBC: Recent Labs    05/07/17 0935 06/16/17 1330   06/19/17 0616 06/20/17 0357 06/23/17 0900  WBC 8.7 11.9*   < > 8.4 6.4 7.2  NEUTROABS 6.3 7.8*  --   --   --  4.5  HGB 15.7* 16.3*   < > 15.4* 14.1 13.5  HCT 47.7* 45.4   < > 43.5 40.7 39.3  MCV 93.0 89.2   < > 88.8 88.5 91.0  PLT 263 347   < > 265 255 253   < > = values in this interval not displayed.   Cardiac Enzymes: No results for input(s): CKTOTAL, CKMB, CKMBINDEX, TROPONINI in the last 8760 hours. BNP: Invalid input(s): POCBNP Lab Results  Component Value Date   HGBA1C 6.4 (H) 04/07/2017   Lab Results  Component Value Date   TSH 0.941 06/18/2017   No results found for: VITAMINB12 No results found for: FOLATE No results found for: IRON, TIBC, FERRITIN  Imaging and Procedures obtained prior to SNF admission: No results found.  Assessment/Plan  Acute Encephalopathy Due to Extension of the Stroke with UTI . Patient is doing well. Restart Therapy. Was also started on Anti seizure Meds for Possible Seizure as cause of her Confusion.  Left MCA Stroke with Right Hemiparesis She is on Aspirin and Plavix. Has brace both for RIGHT UE and LE. Follow up with Neurology.  Essential hypertension Her Meds were stopped in the hospital Not on Any meds right now. BP controlled will Continue to Follow  Aphasia Doing well with Speech  Prediabetes with A1C was 6.4 Sugars mostly less then 150 Was started on Metformin for Risk reduction in the hospital.  Hyperlipidemia LDL 107 Lipitor was increased to 40 mg. Depression To Continue Prozac and Amantadine. H/O GERD Continue on PPI Dispostion Has supportive family.  Plan for going home.    Family/ staff Communication:   Labs/tests ordered: Total time spent in this patient care encounter was 45_ minutes; greater than 50% of the visit spent  counseling patient, reviewing records , Labs and coordinating care for problems addressed at this encounter.

## 2017-06-27 ENCOUNTER — Ambulatory Visit: Payer: Self-pay | Admitting: Adult Health

## 2017-06-27 ENCOUNTER — Other Ambulatory Visit: Payer: Self-pay

## 2017-06-27 NOTE — Patient Outreach (Signed)
Telephone outreach to patient to obtain mRS was successfully completed. mRS = 5 

## 2017-07-02 ENCOUNTER — Encounter (HOSPITAL_COMMUNITY)
Admission: RE | Admit: 2017-07-02 | Discharge: 2017-07-02 | Disposition: A | Discharge status: Home / Self Care | Payer: Medicare Other | Source: Skilled Nursing Facility | Attending: Internal Medicine | Admitting: Internal Medicine

## 2017-07-02 LAB — CBC
HEMATOCRIT: 39.1 % (ref 36.0–46.0)
HEMOGLOBIN: 13.3 g/dL (ref 12.0–15.0)
MCH: 31.5 pg (ref 26.0–34.0)
MCHC: 34 g/dL (ref 30.0–36.0)
MCV: 92.7 fL (ref 78.0–100.0)
Platelets: 285 10*3/uL (ref 150–400)
RBC: 4.22 MIL/uL (ref 3.87–5.11)
RDW: 14.4 % (ref 11.5–15.5)
WBC: 8.4 10*3/uL (ref 4.0–10.5)

## 2017-07-02 LAB — BASIC METABOLIC PANEL
ANION GAP: 10 (ref 5–15)
BUN: 10 mg/dL (ref 6–20)
CHLORIDE: 103 mmol/L (ref 101–111)
CO2: 25 mmol/L (ref 22–32)
Calcium: 9.4 mg/dL (ref 8.9–10.3)
Creatinine, Ser: 0.68 mg/dL (ref 0.44–1.00)
GFR calc non Af Amer: >60 mL/min (ref >60)
GLUCOSE: 98 mg/dL (ref 65–99)
POTASSIUM: 3.5 mmol/L (ref 3.5–5.1)
Sodium: 138 mmol/L (ref 135–145)

## 2017-07-04 ENCOUNTER — Encounter: Payer: Self-pay | Admitting: Physical Medicine & Rehabilitation

## 2017-07-04 ENCOUNTER — Encounter: Payer: Medicare Other | Admitting: Physical Medicine & Rehabilitation

## 2017-07-04 VITALS — BP 148/87 | HR 81 | Ht 62.0 in | Wt 155.0 lb

## 2017-07-04 DIAGNOSIS — I69351 Hemiplegia and hemiparesis following cerebral infarction affecting right dominant side: Secondary | ICD-10-CM

## 2017-07-04 DIAGNOSIS — Z9049 Acquired absence of other specified parts of digestive tract: Secondary | ICD-10-CM | POA: Diagnosis not present

## 2017-07-04 DIAGNOSIS — I69320 Aphasia following cerebral infarction: Secondary | ICD-10-CM | POA: Diagnosis not present

## 2017-07-04 DIAGNOSIS — I69398 Other sequelae of cerebral infarction: Secondary | ICD-10-CM | POA: Diagnosis not present

## 2017-07-04 DIAGNOSIS — R269 Unspecified abnormalities of gait and mobility: Secondary | ICD-10-CM | POA: Diagnosis not present

## 2017-07-04 DIAGNOSIS — R5383 Other fatigue: Secondary | ICD-10-CM | POA: Diagnosis not present

## 2017-07-04 DIAGNOSIS — Z833 Family history of diabetes mellitus: Secondary | ICD-10-CM | POA: Diagnosis not present

## 2017-07-04 DIAGNOSIS — I10 Essential (primary) hypertension: Secondary | ICD-10-CM | POA: Diagnosis not present

## 2017-07-04 DIAGNOSIS — Z9889 Other specified postprocedural states: Secondary | ICD-10-CM | POA: Diagnosis not present

## 2017-07-04 DIAGNOSIS — Z8249 Family history of ischemic heart disease and other diseases of the circulatory system: Secondary | ICD-10-CM | POA: Diagnosis not present

## 2017-07-04 DIAGNOSIS — Z09 Encounter for follow-up examination after completed treatment for conditions other than malignant neoplasm: Secondary | ICD-10-CM | POA: Diagnosis not present

## 2017-07-04 DIAGNOSIS — Z87442 Personal history of urinary calculi: Secondary | ICD-10-CM | POA: Diagnosis not present

## 2017-07-04 NOTE — Progress Notes (Signed)
Botox: Procedure Note Patient Name: Ebony Williams DOB: 15-Jul-1950 MRN: [id number]   Procedure: Botulinum toxin administration Guidance: EMG Diagnosis: Spastic hemiplegia of right dominant side as late effect of cerebral infarction Attending: Delice Lesch, MD  Date: 07/04/17   Trade name: Botox (onabotulinumtoxinA)  Informed consent: Risks, benefits & options of the procedure are explained to the patient (and/or family). The patient elects to proceed with procedure. Risks include but are not limited to weakness, respiratory distress, dry mouth, ptosis, antibody formation, worsening of some areas of function. Benefits include decreased abnormal muscle tone, improved hygiene and positioning, decreased skin breakdown and, in some cases, decreased pain. Options include conservative management with oral antispasticity agents, phenol chemodenervation of nerve or at motor nerve branches. More invasive options include intrathecal balcofen adminstration for appropriate candidates. Surgical options may include tendon lengthening or transposition or, rarely, dorsal rhizotomy.   History/Physical Examination: 67 y.o. right-handed female with a history of hypertension presents for hospital follow up after receiving CIR for left basal ganglia infarction     mAS:  RUE: elbow flex, wrist flex, finger flexor 2/4   RLE: 2/4  Previous Treatments: Therapy/Range of motion Indication for guidance: Target active muscules  Procedure: Botulinum toxin was mixed with preservative free saline with a dilution of 1cc to 100 units. Targeted limb and muscles were identified. The skin was prepped with alcohol swabs and placement of needle tip in targeted muscle was confirmed using appropriate guidance. Prior to injection, positioning of needle tip outside of blood vessel was determined by pulling back on syringe plunger.  MUSCLE UNITS Right Biceps 70 units Right FCR 40 units Right FDS 40 units   Right Gastroc 50  units  Total units used: 076 Complications: None  Plan: RTC 6 weeks  Ankit Anil Patel  9:45 AM

## 2017-07-11 ENCOUNTER — Other Ambulatory Visit: Payer: Self-pay

## 2017-07-11 MED ORDER — TRAMADOL HCL 50 MG PO TABS: 25.0000 mg | ORAL_TABLET | Freq: Four times a day (QID) | ORAL | 0 refills | Status: DC | PRN

## 2017-07-11 NOTE — Telephone Encounter (Signed)
RX Fax for [facility]@ [phone]  

## 2017-07-13 ENCOUNTER — Encounter: Payer: Self-pay | Admitting: Physical Medicine & Rehabilitation

## 2017-07-13 ENCOUNTER — Ambulatory Visit: Payer: Medicare Other | Admitting: Physical Medicine & Rehabilitation

## 2017-07-13 ENCOUNTER — Other Ambulatory Visit: Payer: Self-pay

## 2017-07-13 ENCOUNTER — Encounter: Payer: Medicare Other | Attending: Physical Medicine & Rehabilitation | Admitting: Physical Medicine & Rehabilitation

## 2017-07-13 VITALS — BP 157/91 | HR 85 | Ht 62.0 in | Wt 155.0 lb

## 2017-07-13 DIAGNOSIS — I69398 Other sequelae of cerebral infarction: Secondary | ICD-10-CM | POA: Diagnosis not present

## 2017-07-13 DIAGNOSIS — I69320 Aphasia following cerebral infarction: Secondary | ICD-10-CM | POA: Diagnosis not present

## 2017-07-13 DIAGNOSIS — R5383 Other fatigue: Secondary | ICD-10-CM | POA: Diagnosis not present

## 2017-07-13 DIAGNOSIS — Z833 Family history of diabetes mellitus: Secondary | ICD-10-CM | POA: Diagnosis not present

## 2017-07-13 DIAGNOSIS — I69351 Hemiplegia and hemiparesis following cerebral infarction affecting right dominant side: Secondary | ICD-10-CM

## 2017-07-13 DIAGNOSIS — R269 Unspecified abnormalities of gait and mobility: Secondary | ICD-10-CM

## 2017-07-13 DIAGNOSIS — Z9889 Other specified postprocedural states: Secondary | ICD-10-CM | POA: Insufficient documentation

## 2017-07-13 DIAGNOSIS — F329 Major depressive disorder, single episode, unspecified: Secondary | ICD-10-CM

## 2017-07-13 DIAGNOSIS — Z8249 Family history of ischemic heart disease and other diseases of the circulatory system: Secondary | ICD-10-CM | POA: Insufficient documentation

## 2017-07-13 DIAGNOSIS — Z9049 Acquired absence of other specified parts of digestive tract: Secondary | ICD-10-CM | POA: Diagnosis not present

## 2017-07-13 DIAGNOSIS — Z87442 Personal history of urinary calculi: Secondary | ICD-10-CM | POA: Diagnosis not present

## 2017-07-13 DIAGNOSIS — I10 Essential (primary) hypertension: Secondary | ICD-10-CM | POA: Diagnosis not present

## 2017-07-13 DIAGNOSIS — G8191 Hemiplegia, unspecified affecting right dominant side: Secondary | ICD-10-CM

## 2017-07-13 DIAGNOSIS — Z09 Encounter for follow-up examination after completed treatment for conditions other than malignant neoplasm: Secondary | ICD-10-CM | POA: Diagnosis not present

## 2017-07-13 NOTE — Progress Notes (Signed)
Subjective:    Patient ID: Ebony Williams, female    DOB: [birth year], 67 y.o.   MRN: [id number]  HPI 67 year old right-handed female with a history of hypertension presents for hospital follow up for left basal ganglia infarction.   Last clinic visit on 07/04/17.  At that time she received Botox injection.  Prior to that she was readmitted to the hospital for extension of stroke and AMS, notes reviewed.  Presents with daughter, who provides much of history.  Husband also present. She continues to get therapies at facility.  She was started on Tramadol due to pain.  Pain is in RUE, mainly shoulder. She sees Neurology next week.  She stopped Amantadine without issue. BP is elevated today, however, controlled per SNF note review.  Her orthostatic symptoms have improved. Daughter notes return of tremors around the same time tramadol was restarted, 3 days ago.    Pain Inventory Average Pain 4 Pain Right Now 0 My pain is intermittent and sharp  In the last 24 hours, has pain interfered with the following? General activity 10 Relation with others 10 Enjoyment of life 0 What TIME of day is your pain at its worst? varies Sleep (in general) Fair  Pain is worse with: bending and standing Pain improves with: rest and medication Relief from Meds: 2  Mobility use a walker use a wheelchair needs help with transfers  Function retired I need assistance with the following:  dressing, bathing, toileting, meal prep, household duties and shopping  Neuro/Psych bladder control problems bowel control problems weakness tremor trouble walking dizziness confusion depression anxiety  Prior Studies Any changes since last visit?  no  Physicians involved in your care Any changes since last visit?  no   Family History  Problem Relation Age of Onset  . Diabetes Mother   . Hypertension Mother    Social History   Socioeconomic History  . Marital status: Married    Spouse name: Not on file   . Number of children: Not on file  . Years of education: Not on file  . Highest education level: Not on file  Occupational History  . Not on file  Social Needs  . Financial resource strain: Not on file  . Food insecurity:    Worry: Not on file    Inability: Not on file  . Transportation needs:    Medical: Not on file    Non-medical: Not on file  Tobacco Use  . Smoking status: Never Smoker  . Smokeless tobacco: Never Used  Substance and Sexual Activity  . Alcohol use: Yes    Comment: very rare  . Drug use: No  . Sexual activity: Not on file  Lifestyle  . Physical activity:    Days per week: Not on file    Minutes per session: Not on file  . Stress: Not on file  Relationships  . Social connections:    Talks on phone: Not on file    Gets together: Not on file    Attends religious service: Not on file    Active member of club or organization: Not on file    Attends meetings of clubs or organizations: Not on file    Relationship status: Not on file  Other Topics Concern  . Not on file  Social History Narrative  . Not on file   Past Surgical History:  Procedure Laterality Date  . CHOLECYSTECTOMY     laparoscopic  . COLONOSCOPY WITH PROPOFOL N/A 01/12/2015   Procedure:  COLONOSCOPY WITH PROPOFOL;  Surgeon: Garlan Fair, MD;  Location: [facility];  Service: Endoscopy;  Laterality: N/A;  . TUBAL LIGATION     Past Medical History:  Diagnosis Date  . History of kidney stones    x1  . Hypertension    BP (!) 157/91   Pulse 85   Ht 5\' 2"  (1.575 m) Comment: last recorded  Wt 155 lb (70.3 kg) Comment: last recorded, cannot stand  SpO2 95%   BMI 28.35 kg/m   Opioid Risk Score:   Fall Risk Score:  `1  Depression screen PHQ 2/9  Depression screen PHQ 2/9 07/13/2017  Decreased Interest 3  Down, Depressed, Hopeless 3  PHQ - 2 Score 6     Review of Systems  Constitutional: Negative.   HENT: Negative.   Eyes: Negative.   Respiratory: Negative.     Cardiovascular: Negative.   Gastrointestinal: Positive for nausea.  Endocrine: Negative.   Genitourinary: Negative.   Musculoskeletal: Positive for gait problem.       Spasms  Skin: Negative.   Allergic/Immunologic: Negative.   Neurological: Positive for dizziness, tremors and weakness.  Hematological: Bruises/bleeds easily.  Psychiatric/Behavioral: Positive for confusion and dysphoric mood. The patient is nervous/anxious.   All other systems reviewed and are negative.     Objective:   Physical Exam Constitutional: No distress . Vital signs reviewed. HENT: Normocephalic, atraumatic. Eyes: EOMI, No discharge. Cardiovascular: RRR. No JVD    Respiratory: CTA Bilaterally. Normal effort    GI: BS +, non-distended  Musculoskeletal: No edema or tenderness in extremities  Neurological: Alert and oriented. Dysphonia/Dysarthria Motor: LUE 4/5 proximal to distal LLE: 4/5 hip flexion, 4+/5 knee extension, 4+/5 ADF RUE/RLE: 0/5 proximal to distal MAS: RUE: elbow flex 2/4, wrist flex 2/4, finger flexor 2/4 RLE: APF 2/4 Skin: Skin is warm and dry.  Psychiatric: Flat    Assessment & Plan:  67 year old right-handed female with a history of hypertension presents for hospital follow up after receiving CIR for left basal ganglia infarction.   1. Right side weakness with aphasia secondary to multifocal ischemic left MCA and PCA infarcts on 04/06/17  Cont therapies at SNF  Cont bracing, encouraged use of WHO qhs  Follow up with Neurology, appointment next week  2. Hypertension.  With orthostatic symptoms as described by daughter  BP controlled based on review of VS from SNF  Encouraged gradual postural changes  3. Gait abnormality  Cont therapies  Cont wheelchair  4. Spasticity late effect of CVA:  Cont bracing  Cont therapies  Last visit Botox:  Right Biceps 70 units      Right FCR 40 units      Right FDS 40 units       Right Gastroc 50 units  Retrial of Baclofen 5 TID, stop if  pt has increase in sedation (previous side effect at hospital)  Anticipate pt will need to increase dosage based on extension of stroke and progression of spasticity  5. Depression  Start Fluoxetine 10, may increase to 20 after 1 week if no side effects  Recommended psychology, however, pt reluctant at this time, encouraged to reevaluate  >40 minutes spent with patient and family in counseling regarding post-stroke sequela, pain, spasticity, depression

## 2017-07-17 ENCOUNTER — Non-Acute Institutional Stay (SKILLED_NURSING_FACILITY): Payer: Medicare Other | Admitting: Internal Medicine

## 2017-07-17 ENCOUNTER — Encounter: Payer: Self-pay | Admitting: Internal Medicine

## 2017-07-17 DIAGNOSIS — I69351 Hemiplegia and hemiparesis following cerebral infarction affecting right dominant side: Secondary | ICD-10-CM

## 2017-07-17 DIAGNOSIS — F329 Major depressive disorder, single episode, unspecified: Secondary | ICD-10-CM

## 2017-07-17 DIAGNOSIS — I63512 Cerebral infarction due to unspecified occlusion or stenosis of left middle cerebral artery: Secondary | ICD-10-CM | POA: Diagnosis not present

## 2017-07-17 DIAGNOSIS — I10 Essential (primary) hypertension: Secondary | ICD-10-CM | POA: Diagnosis not present

## 2017-07-17 NOTE — Progress Notes (Addendum)
[facility] [address] [place]. [place] [address] [phone]       OFFICE FOLLOW UP NOTE  Ms. Ebony Williams [facility] Date of Birth:  09-May-1950 Medical Record Number:  [id number]   Reason for Referral:  hospital stroke follow up  CHIEF COMPLAINT:  Chief Complaint  Patient presents with  . New Patient (Initial Visit)    hospital follow up. Patient reports that for the last couple days she vomits around lunch time, she has no appetite.   . Other    Patient reports that she has had some good days, but she is still in a lot of pain.     HPI: Ebony Williams is being seen today for initial visit in the office for left BG infarct on 04/06/17. History obtained from patient and chart review. Reviewed all radiology images and labs personally.  Ebony Williams is a 67 y.o. female with history of hypertension, previous strokes by imaging, and renal calculi who was brought to the emergency department on 04/06/2017 after 2-hour onset of right-sided weakness, right facial droop and altered mental status.  CT head showed subacute left-sided basal nonhemorrhagic infarct and TPA was administered. CTA/CTP obtained which showed chronic appearing severe proximal left M1 stenosis without LVO. There was a fixed/matched perfusion defect at the left parietal-occipital junction with increased transmit time in BG, but no large cortical perfusion mismatch and the patient was determined to not be a canidate for intervention/thrombectomy candidate with these findings. After tPA was administered, the patient had a positive response without complication.  MRI head reviewed and showed acute multifocal ischemic left MCA left MCA/PCA border zone infarcts positioning is somewhat watershed/along with a petechial hemorrhage without hemorrhagic transformation or mass-effect.  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  2D echo showed an EF 65 to 70% without cardiac  source of emboli.  LDL 107 and recommended Lipitor 10 mg daily at discharge.  Patient was not on antithrombotic prior to admission and recommended DAPT for 3 weeks and then aspirin 81 mg alone.  Incidental finding of 3.7 cm today ordered recommended annual imaging follow-up by CTA or MRA.  Patient was discharged to CIR in stable condition.   Patient returned to ED on 05/18/2017 after being found nonverbal.  She did not receive IV t-PA due to recent infarcts.  CT head reviewed it is questionable whether newly seen low density in the left side of the pons possibly representing a pontine infarction.  MRI head showed evolving ischemia with petechial hemorrhage, laminar necrosis, and developing encephalomalacia in the left MCA PCA parenchyma affected on 04/07/2017 along with continued effusion abnormality in the left basal portions of the left occipital lobe.  CTA head and neck was negative for any cranial large or medium vessel occlusion.   Patient was recently discharged on aspirin and Plavix and this is recommended to continue.  Patient was not on statin PTA and his LDL 107 and was recommended to start Lipitor 40 mg daily.  Patient was started on Keppra 1000mg  every 12 hours due to abnormal EEG which showed left-sided brief electrographic seizures.  Patient was discharged from a facility [facility].    Patient is being seen today for hospital follow-up and is accompanied by her husband and daughter.  She continues to reside at [facility] where she continues to receive PT/OT/ST daily.  Continues to have some improvement but also has significant amount of right hemiparesis  with spasticity.  Patient is currently sitting in wheelchair and is only able to transfer with moderate assistance by standing and pivoting.  She recently received Botox injections by Dr. Posey Pronto but has not seen benefit from this time as this has been the first injection.  She was previously on baclofen for spasticity but was unable  to tolerate it she was lethargic throughout the day.  Patient also continues to have expressive aphasia but this is also been improving.  She also has complaints about recent vomiting and decreased appetite.  She states that some days she does not eat at all due to decreased appetite.  Also has complaints of right eye blurred vision since her stroke and daughter states that they will be scheduling ophthalmologist appointment to look further into this.  Daughter states that she recently received a letter stating that insurance will not be paying for additional states that her current facility and that she will be discharged home.  Daughter has concerns regarding this due to lack of cooperation with patient's husband as far as making house handicap accessible and obtaining adequate car for transportation, daughter is currently appealing insurances decision.  Husband was falling asleep during appointment multiple times where patient was consistently trying to awaken him and patient continued to get upset by this.  Per daughter, husband is only offering to have maximum of 5 hours of nursing care daily when she returns home.  Patient is a total assist in total care patient at this time as she is incontinent of both stool and urine, unable to fully bathe or dress herself and unable to transfer by herself.  She needs additional assistance with medication management and other IADLs.  Daughter is unsure if husband will be able to handle this.  Per daughter, patient has been the main caregiver in the relationship and this has been very stressful on patient due to lack of support by husband.  Patient is very tearful throughout the appointment especially when he consistently fell asleep throughout the appointment.  It would not be in the best interest of the patient to return home at this time due to safety concerns and lack of appropriate environment.  Daughter states that she tries to assist as often as possible but does also  have a full-time job, 2 children of her own and the husband.  She is unable to be there 24/7 to assist with needs.  Patient agrees that even though she wants to return home, it would not be in her best interest to return so at this time as she would like to make some more progress prior to returning home.  Due to increased depression and anxiety, she was started on Prozac and continues to take her Effexor and this is managed by provider at facility (Effexor was not on [facility] provided by facility and it was advised to daughter to ensure that patient is in fact taking this medication).  Patient does have a poor outlook for the future and she feels as though she will not improve and gets frustrated or upset with husband easily and her condition.  Blood pressure elevated at today's appointment 161/90 and amlodipine was restarted yesterday which is also being managed by facility.  Patient continues to take aspirin and Plavix with mild bruising but no bleeding.  Continues to take Lipitor without side effects of myalgias.  Patient also continues to take Keppra at this time without complaints of side effects.  Denies new or worsening stroke/TIA symptoms.   ROS:  14 system review of systems performed and negative with exception of activity change, appetite change, fatigue, blurred vision, chest tightness, leg swelling, nausea, vomiting, incontinence of bowels, daytime sleepiness, bruise easily, dizziness, headache, numbness, speech difficulty, weakness, tremors, depression, and nervous/anxious  PMH:  Past Medical History:  Diagnosis Date  . History of kidney stones    x1  . Hypertension     PSH:  Past Surgical History:  Procedure Laterality Date  . CHOLECYSTECTOMY     laparoscopic  . COLONOSCOPY WITH PROPOFOL N/A 01/12/2015   Procedure: COLONOSCOPY WITH PROPOFOL;  Surgeon: Garlan Fair, MD;  Location: [facility];  Service: Endoscopy;  Laterality: N/A;  . TUBAL LIGATION      Social History:  Social  History   Socioeconomic History  . Marital status: Married    Spouse name: Not on file  . Number of children: Not on file  . Years of education: Not on file  . Highest education level: Not on file  Occupational History  . Not on file  Social Needs  . Financial resource strain: Not on file  . Food insecurity:    Worry: Not on file    Inability: Not on file  . Transportation needs:    Medical: Not on file    Non-medical: Not on file  Tobacco Use  . Smoking status: Never Smoker  . Smokeless tobacco: Never Used  Substance and Sexual Activity  . Alcohol use: Yes    Comment: very rare  . Drug use: No  . Sexual activity: Not on file  Lifestyle  . Physical activity:    Days per week: Not on file    Minutes per session: Not on file  . Stress: Not on file  Relationships  . Social connections:    Talks on phone: Not on file    Gets together: Not on file    Attends religious service: Not on file    Active member of club or organization: Not on file    Attends meetings of clubs or organizations: Not on file    Relationship status: Not on file  . Intimate partner violence:    Fear of current or ex partner: Not on file    Emotionally abused: Not on file    Physically abused: Not on file    Forced sexual activity: Not on file  Other Topics Concern  . Not on file  Social History Narrative  . Not on file    Family History:  Family History  Problem Relation Age of Onset  . Diabetes Mother   . Hypertension Mother     Medications:   Current Outpatient Medications on File Prior to Visit  Medication Sig Dispense Refill  . acetaminophen (TYLENOL) 325 MG tablet Take 650 mg by mouth every 4 (four) hours as needed.    [name] amantadine (SYMMETREL) 100 MG capsule Take 2 capsules (200 mg total) by mouth 2 (two) times daily. 10 capsule 0  . amLODipine (NORVASC) 5 MG tablet Take 5 mg by mouth daily.    [name] aspirin 81 MG chewable tablet Chew 1 tablet (81 mg total) by mouth daily.    [name]  atorvastatin (LIPITOR) 40 MG tablet Take 1 tablet (40 mg total) by mouth daily.    . clopidogrel (PLAVIX) 75 MG tablet Take 1 tablet (75 mg total) by mouth daily.    [name] FLUoxetine (PROZAC) 40 MG capsule Take 1 capsule (40 mg total) by mouth at bedtime.  3  . levETIRAcetam (KEPPRA) 1000  MG tablet Take 1 tablet (1,000 mg total) by mouth 2 (two) times daily.    . metFORMIN (GLUCOPHAGE) 500 MG tablet Take 1 tablet (500 mg total) by mouth daily with breakfast.    . nystatin (NYSTATIN) powder Apply topically See admin instructions. Apply topically to groin and outer vaginal area twice daily prn for rash/irritation    . ondansetron (ZOFRAN) 4 MG tablet Take 4 mg by mouth every 8 (eight) hours as needed for nausea or vomiting.    . pantoprazole (PROTONIX) 40 MG tablet Take 1 tablet (40 mg total) by mouth daily.    . polyethylene glycol (MIRALAX / GLYCOLAX) packet Take 17 g by mouth daily. Mix with 8-10 oz water and drink    . traMADol (ULTRAM) 50 MG tablet Take 0.5 tablets (25 mg total) by mouth every 6 (six) hours as needed. 30 tablet 0   No current facility-administered medications on file prior to visit.     Allergies:   Allergies  Allergen Reactions  . Sulfa Antibiotics Diarrhea and Itching     Physical Exam  Vitals:   07/18/17 0845  BP: (!) 161/90  Pulse: 87  Height: 5\' 2"  (1.575 m)   Body mass index is 28.35 kg/m. No exam data present  General: well developed, pleasant elderly Caucasian female, well nourished, seated, tearful throughout appointment Head: head normocephalic and atraumatic.   Neck: supple with no carotid or supraclavicular bruits Cardiovascular: regular rate and rhythm, no murmurs Musculoskeletal: no deformity Skin:  no rash/petichiae Vascular:  Normal pulses all extremities  Neurologic Exam Mental Status: Awake and fully alert. Moderate expressive aphasia. Oriented to place and time. Recent and remote memory intact. Attention span, concentration and fund of  knowledge appropriate. Mood and affect appropriate.  Cranial Nerves: Fundoscopic exam reveals sharp disc margins. Pupils equal, briskly reactive to light. Extraocular movements full without nystagmus. Visual fields full to confrontation. Hearing intact. Facial sensation intact.  Mild right facial paralysis. Motor: RUE: 0/5 bicep, tricep and should abduction, able to slighly move fingerse; RLE: 1/5; spasticity in right upper and lower extremity; LUE and LLE: 5/5 strength Sensory.: intact to touch , pinprick , position and vibratory sensation.  Coordination: Rapid alternating movements normal in right side. Finger-to-nose and heel-to-shin performed accurately on right side.  Gait and Station: Patient is currently sitting in wheelchair as she is nonambulatory.  She did stand x1 during appointment with assistance of daughter as she places all weight on left lower leg.   Reflexes: 1+ and symmetric. Toes downgoing.    NIHSS  10 Modified Rankin  5    Diagnostic Data (Labs, Imaging, Testing)  Ct Angio Head W Or Wo Contrast Ct Angio Neck W Or Wo Contrast 06/16/2017 IMPRESSION: No intracranial large or medium vessel occlusion identified. Atherosclerotic tortuosity of the aorta. 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 Minimal atherosclerotic change at the left carotid bifurcation but no stenosis.    Mr Jeri Cos And Wo Contrast 06/16/2017 IMPRESSION:  1. Evolving ischemia with petechial hemorrhage, laminar necrosis, and developing encephalomalacia in the left MCA and PCA parenchyma affected on 04/07/2017. Continued diffusion abnormality in the left basal ganglia and portions of the left occipital lobe. Post ischemic enhancement.  2. Underlying advanced chronic small vessel disease suspected. Pronounced chronic appearing signal changes in the medial thalami and pons.  3. No new intracranial ischemia or new intracranial abnormality identified.    Ct Head  Code Stroke Wo Contrast 06/16/2017 IMPRESSION:  1. Question newly seen low-density in the left side of the pons, which could represent a pontine infarction.  2. Late subacute infarctions in the left occipital lobe and in the left basal ganglia and deep white matter show expected evolutionary changes.  3. Extensive chronic small-vessel ischemic changes elsewhere throughout the hemispheric white matter. 4. ASPECTS is 10. 5.     Dg Chest Portable 1 View 06/16/2017 IMPRESSION:  Mild LEFT basilar atelectasis.    Transthoracic Echocardiogram  04/08/2017 Study Conclusions - Left ventricle: The cavity size was normal. Wall thickness was increased in a pattern of mild LVH. There was moderate focal basal hypertrophy of the septum. Systolic function was vigorous. The estimated ejection fraction was in the range of 65% to 70%. Wall motion was normal; there were no regional wall motion abnormalities. Doppler parameters are consistent with abnormal left ventricular relaxation (grade 1 diastolic dysfunction). - Aortic valve: There was mild regurgitation. Impressions: - Vigorous LV systolic function; mild LVH with moderate proximal septal thickening and narrow LVOT; mildly elevated LVOT gradient (peak velocity 2 m/s); mild diastolic dysfunction; mild AI.   Bilateral Carotid Dopplers -not needed as she had CT angiogram of the neck  24 hour continuous EEG 06/19/17 : abnormal EEG due to the presence of the following: 1) Background delta and theta slowing, suggesting moderate to severe encephalopathy, which is nonspecific as to etiology; 2) Prominent focal polymorphic delta slowing in the left hemisphere, suggesting left hemispheric dysfunction and/or structural lesion, consistent with patient's history of stroke; 3) Frequent epileptiform discharges in the left posterior temporoparietal region,whichwaxed and waned andoftentimes appeared semi-periodic, concerning for intermittent  focal electrographic seizures.      ASSESSMENT: Ebony Williams is a 67 y.o. year old female here with left BG infarct on 04/06/2017 and recurrent left BG and MCA branch infarcts on 06/17/2017 secondary to MCA stenosis. Vascular risk factors include HTN.     PLAN: -Continue aspirin 81 mg daily and clopidogrel 75 mg daily  and Lipitor for secondary stroke prevention -Continue PT/OT/ST -F/u with PCP regarding your HLD and HTN management -f/u with Dr. Posey Pronto for additional Botox injections and advised patient that she may need a series of injections before she finds benefit -Long discussion with patient regarding outlook on recovery and so we are unable to state exactly how much recovery she will make, she has to be positive and continue to do as much as she can with PT/OT/ST as this will be the only way to make progress.  Also advised patient that her #1 priority right now needs to be herself in order to improve.  Patient verbalizes understanding but continues being tearful.  Patient is being followed by facility provider for management of depression -unable to reach daughter back at this time - if she calls, will recommend calling [employer] who can help with further safe assistance - ADDENDUM - spoke with [place] (patients daughter) regarding use of [employer] and how they can assist her further. She was currently driving when I called her, but she will reach out to [facility] -continue to monitor BP at home -Maintain strict control of hypertension with blood pressure goal below 130/90, diabetes with hemoglobin A1c goal below 6.5% and cholesterol with LDL cholesterol (bad cholesterol) goal below 70 mg/dL. I also advised the patient to eat a healthy diet with plenty of whole grains, cereals, fruits and vegetables, exercise regularly and maintain ideal body weight.  Follow up in 3 months or call earlier if needed  Greater  than 50% of time during this 25 minute visit was spent on  counseling,explanation of diagnosis of left BG infarct, reviewing risk factor management of 04/06/2017, planning of further management, discussion with patient and family and coordination of care   Venancio Poisson, [facility]  [facility] [address] [place], [place] [address]  Phone [phone] Fax [phone]

## 2017-07-17 NOTE — Progress Notes (Signed)
Location:   [place] Room Number: 136/P Place of Service:  SNF (31) Provider:  Edd Fabian, MD  Patient Care Team: Sharilyn Sites, MD as PCP - General [facility] Medicine)  Extended Emergency Contact Information Primary Emergency Contact: Hegwood,Elvin Address: [address]          [place], [place] [address] [place] Phone: [phone] Relation: Spouse Secondary Emergency Contact: Anson Fret [place] Phone: [phone] Relation: Daughter  Code Status:  Full Code Goals of care: Advanced Directive information Advanced Directives 07/17/2017  Does Patient Have a Medical Advance Directive? Yes  Type of Advance Directive (No Data)  Does patient want to make changes to medical advance directive? No - Patient declined  Copy of [place] in Chart? -  Would patient like information on creating a medical advance directive? -     Chief Complaint  Patient presents with  . Acute Visit    Patient being seen for Hypertensin and increased Somnolence    HPI:  Pt is a 67 y.o. female seen today for an acute visit for Hypertension and Patient c/o feeling more Sleepy.  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 She was discharged on 03/06 to Acute inpatient Rehab. From there patient was discharged to SNF . She was again admitted to Hospital on 05/11 with Mental status changes. Her Daughter found her to be non responsive. She was admitted and underwent extensive test including repeat MRI which showed new diffusion-positive area adjacent to the recent left basal ganglia and posterior parietal infarcts. She also had UTI which was treated with Antibitocs. Her EEG showed ? Possible Seizure activity.without any clinical Significance She was also started on  Kepprra. Patient has been in SNF since then. She has made some progress with Therapy but continue to have significant Aphasia and Right Sided Hemiplegia Recently her BP has been slowly going up. 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 She also has been c/o feeling Sleepy after she was started on Baclofen which was discontinued yesterday. She was also c/o some lower abdominal pain with Nausea today. She and her family are trying to plan for her discharge home this Thurs as it is going to be big change for all of them.  Past Medical History:  Diagnosis Date  . History of kidney stones    x1  . Hypertension    Past Surgical History:  Procedure Laterality Date  . CHOLECYSTECTOMY     laparoscopic  . COLONOSCOPY WITH PROPOFOL N/A 01/12/2015   Procedure: COLONOSCOPY WITH PROPOFOL;  Surgeon: Garlan Fair, MD;  Location: [facility] ENDOSCOPY;  Service: Endoscopy;  Laterality: N/A;  . TUBAL LIGATION      Allergies  Allergen Reactions  . Sulfa Antibiotics Diarrhea and Itching    Outpatient Encounter Medications as of 07/17/2017  Medication Sig  . acetaminophen (TYLENOL) 325 MG tablet Take 650 mg by mouth every 4 (four) hours as needed.  [name] amantadine (SYMMETREL) 100 MG capsule Take 2 capsules (200 mg total) by mouth 2 (two) times daily.  [name] aspirin 81 MG chewable tablet Chew 1 tablet (81 mg total) by mouth daily.  [name] atorvastatin (LIPITOR) 40 MG tablet Take 1 tablet (40 mg total) by mouth daily.  . clopidogrel (PLAVIX) 75 MG  tablet Take 1 tablet (75 mg total) by mouth daily.  [name] FLUoxetine (PROZAC) 40 MG capsule Take 1 capsule (40 mg total) by mouth at bedtime.  . levETIRAcetam (KEPPRA) 1000 MG tablet Take 1 tablet (1,000 mg total) by mouth 2 (two) times daily.  . metFORMIN (GLUCOPHAGE) 500 MG tablet Take 1 tablet (500 mg total) by mouth daily with breakfast.  . nystatin (NYSTATIN) powder Apply topically See admin instructions.  Apply topically to groin and outer vaginal area twice daily prn for rash/irritation  . ondansetron (ZOFRAN) 4 MG tablet Take 4 mg by mouth every 8 (eight) hours as needed for nausea or vomiting.  . pantoprazole (PROTONIX) 40 MG tablet Take 1 tablet (40 mg total) by mouth daily.  . polyethylene glycol (MIRALAX / GLYCOLAX) packet Take 17 g by mouth daily. Mix with 8-10 oz water and drink  . traMADol (ULTRAM) 50 MG tablet Take 0.5 tablets (25 mg total) by mouth every 6 (six) hours as needed.   No facility-administered encounter medications on file as of 07/17/2017.      Review of Systems  Constitutional: Positive for activity change, appetite change and fatigue.  HENT: Negative.   Respiratory: Negative.   Cardiovascular: Negative.   Gastrointestinal: Positive for abdominal pain and nausea.  Genitourinary: Negative.   Musculoskeletal: Negative.   Skin: Negative.   Neurological: Negative.   Psychiatric/Behavioral: Positive for dysphoric mood. The patient is nervous/anxious.      There is no immunization history on file for this patient. Pertinent  Health Maintenance Due  Topic Date Due  . PNA vac Low Risk Adult (1 of 2 - PCV13) 07/26/2017 (Originally 03/03/2013)  . INFLUENZA VACCINE  09/06/2017  . MAMMOGRAM  10/11/2018  . COLONOSCOPY  01/11/2025  . DEXA SCAN  Completed   Fall Risk  07/13/2017 07/04/2017 06/07/2017  Falls in the past year? Yes Yes Yes  Comment no fall recently - -  Number falls in past yr: 2 or more 2 or more 1  Injury with Fall? - No No  Risk Factor Category  High Fall Risk High Fall Risk -  Risk for fall due to : - Impaired balance/gait;Impaired mobility;History of fall(s);Mental status change Other (Comment)   Functional Status Survey:    Vitals:   07/17/17 1044  BP: (!) 192/89  Pulse: 69  Resp: 20  Temp: (!) 97.3 F (36.3 C)  TempSrc: Oral  SpO2: 96%   There is no height or weight on file to calculate BMI. Physical Exam  Constitutional: She is oriented  to person, place, and time. She appears well-developed and well-nourished.  HENT:  Head: Normocephalic.  Mouth/Throat: Oropharynx is clear and moist.  Eyes: Pupils are equal, round, and reactive to light.  Neck: Neck supple.  Cardiovascular: Normal rate and regular rhythm.  No murmur heard. Pulmonary/Chest: Effort normal and breath sounds normal. No stridor. No respiratory distress. She has no wheezes.  Abdominal: Soft. Bowel sounds are normal. She exhibits no distension. There is no tenderness. There is no guarding.  Musculoskeletal: She exhibits no edema.  Neurological: She is alert and oriented to person, place, and time.  Right Hemiparesis  Skin: Skin is warm and dry.  Psychiatric: She has a normal mood and affect. Her behavior is normal. Thought content normal.    Labs reviewed: Recent Labs    06/16/17 1848  06/21/17 0441 06/22/17 0342 06/23/17 0900 06/25/17 0700 07/02/17 0400  NA  --    < > 141 141 138 138 138  K  --    < >  2.9* 3.4* 3.4* 3.8 3.5  CL  --    < > 106 109 103 101 103  CO2  --    < > 23 24 25 27 25   GLUCOSE  --    < > 116* 114* 118* 115* 98  BUN  --    < > <5* <5* 8 10 10   CREATININE  --    < > 0.48 0.46 0.49 0.53 0.68  CALCIUM  --    < > 8.9 8.9 9.3 9.2 9.4  MG 2.0  --  1.4* 1.9  --   --   --    < > = values in this interval not displayed.   Recent Labs    04/20/17 1048 04/25/17 0459 06/16/17 1459  AST 61* 31 29  ALT 67* 43 26  ALKPHOS 78 82 82  BILITOT 0.9 0.9 1.2  PROT 7.9 6.9 6.8  ALBUMIN 3.4* 3.2* 3.6   Recent Labs    05/07/17 0935 06/16/17 1330  06/20/17 0357 06/23/17 0900 07/02/17 0400  WBC 8.7 11.9*   < > 6.4 7.2 8.4  NEUTROABS 6.3 7.8*  --   --  4.5  --   HGB 15.7* 16.3*   < > 14.1 13.5 13.3  HCT 47.7* 45.4   < > 40.7 39.3 39.1  MCV 93.0 89.2   < > 88.5 91.0 92.7  PLT 263 347   < > 255 253 285   < > = values in this interval not displayed.   Lab Results  Component Value Date   TSH 0.941 06/18/2017   Lab Results    Component Value Date   HGBA1C 6.4 (H) 04/07/2017   Lab Results  Component Value Date   CHOL 159 04/07/2017   HDL 32 (L) 04/07/2017   LDLCALC 107 (H) 04/07/2017   TRIG 98 04/07/2017   CHOLHDL 5.0 04/07/2017    Significant Diagnostic Results in last 30 days:  Dg Shoulder Right  Result Date: 06/20/2017 CLINICAL DATA:  Status post fall.  History of previous CVA. EXAM: RIGHT SHOULDER - 2+ VIEW COMPARISON:  Limited views of the right shoulder from a chest x-ray of April 16, 2017. FINDINGS: The bones of the right shoulder are subjectively adequately mineralized. There is no acute fracture or dislocation. There is subjective mild narrowing of the glenohumeral joint. The [facility] joint is reasonably well-maintained. The subacromial subdeltoid space is normal. IMPRESSION: There is no acute bony abnormality of the right shoulder. I can't exclude mild degenerative narrowing of the glenohumeral joint. Electronically Signed   By: David  Martinique M.D.   On: 06/20/2017 13:22   Dg Elbow 2 Views Right  Result Date: 06/20/2017 CLINICAL DATA:  Status post fall.  History of previous CVA EXAM: RIGHT ELBOW - 2 VIEW COMPARISON:  None in PACs FINDINGS: The bones of the elbow are subjectively adequately mineralized. There is no acute fracture nor dislocation. There is no joint effusion. The overlying soft tissues exhibit no acute abnormalities. IMPRESSION: There is no acute or significant chronic bony abnormality of the right elbow. Electronically Signed   By: David  Martinique M.D.   On: 06/20/2017 13:17    Assessment/Plan  Essential Hypertension Will restart her on Norvasc 5 mg Vitals Q Shift. Will be slow  Due to her h/o Postural Hypotension  Increased somnolence Discontinued Baclofen Will repeat CBC,CMP and UA to rule out infectious etiology. Her somnolence can be multifactorial.   Acute Encephalopathy Due to Extension of the Stroke with UTI . Patient is  doing little better. Working with Therapy. Was also  started on Anti seizure Meds for Possible Seizure as cause of her Confusion. Her mental status has stayed near baseline in facility  Left MCA Stroke with Right Hemiparesis She is on Aspirin and Plavix. Has brace both for RIGHT UE and LE. Follow up with Neurology. She also follows with Rehab for Botox injections  Aphasia Doing well with Speech  Prediabetes with A1C was 6.4 Sugars mostly less then 150 Was started on Metformin for Risk reduction in the hospital.  Hyperlipidemia LDL 107 Lipitor was increased to 40 mg. Depression To Continue Prozac and Amantadine. This has been little concerning for her family as they continue to feel patient is having difficult time with her New reality. H/O GERD Continue on PPI Disposition D/W Daughter and her husband Patient is suppose to go home in 2 days but her family feels 26 . Will continue to provide support with Home Therapy . Also have made suggestions for her to stay in SNF for few more Months Family/ staff Communication:   Labs/tests ordered:  CMP,CBC and UA Total time spent in this patient care encounter was _45 minutes; greater than 50% of the visit spent counseling patient, reviewing records , Labs and coordinating care for problems addressed at this encounter.

## 2017-07-18 ENCOUNTER — Encounter (HOSPITAL_COMMUNITY)
Admission: RE | Admit: 2017-07-18 | Discharge: 2017-07-18 | Disposition: A | Discharge status: Home / Self Care | Payer: Medicare Other | Source: Skilled Nursing Facility | Attending: Internal Medicine | Admitting: Internal Medicine

## 2017-07-18 ENCOUNTER — Encounter: Payer: Self-pay | Admitting: Adult Health

## 2017-07-18 ENCOUNTER — Non-Acute Institutional Stay (SKILLED_NURSING_FACILITY): Payer: Medicare Other | Admitting: Internal Medicine

## 2017-07-18 ENCOUNTER — Ambulatory Visit: Payer: Medicare Other | Admitting: Adult Health

## 2017-07-18 ENCOUNTER — Encounter: Payer: Self-pay | Admitting: Internal Medicine

## 2017-07-18 VITALS — BP 161/90 | HR 87 | Ht 62.0 in

## 2017-07-18 DIAGNOSIS — E785 Hyperlipidemia, unspecified: Secondary | ICD-10-CM | POA: Diagnosis not present

## 2017-07-18 DIAGNOSIS — I10 Essential (primary) hypertension: Secondary | ICD-10-CM | POA: Insufficient documentation

## 2017-07-18 DIAGNOSIS — R4701 Aphasia: Secondary | ICD-10-CM | POA: Diagnosis not present

## 2017-07-18 DIAGNOSIS — I69351 Hemiplegia and hemiparesis following cerebral infarction affecting right dominant side: Secondary | ICD-10-CM

## 2017-07-18 DIAGNOSIS — R7303 Prediabetes: Secondary | ICD-10-CM | POA: Diagnosis not present

## 2017-07-18 DIAGNOSIS — I63512 Cerebral infarction due to unspecified occlusion or stenosis of left middle cerebral artery: Secondary | ICD-10-CM

## 2017-07-18 DIAGNOSIS — I69320 Aphasia following cerebral infarction: Secondary | ICD-10-CM | POA: Insufficient documentation

## 2017-07-18 DIAGNOSIS — G8191 Hemiplegia, unspecified affecting right dominant side: Secondary | ICD-10-CM

## 2017-07-18 DIAGNOSIS — F329 Major depressive disorder, single episode, unspecified: Secondary | ICD-10-CM | POA: Diagnosis not present

## 2017-07-18 LAB — URINALYSIS, ROUTINE W REFLEX MICROSCOPIC
Bilirubin Urine: NEGATIVE
GLUCOSE, UA: NEGATIVE mg/dL
Ketones, ur: NEGATIVE mg/dL
Nitrite: POSITIVE — AB
PH: 5 (ref 5.0–8.0)
Protein, ur: NEGATIVE mg/dL
SPECIFIC GRAVITY, URINE: 1.019 (ref 1.005–1.030)

## 2017-07-18 LAB — COMPREHENSIVE METABOLIC PANEL
ALBUMIN: 3.9 g/dL (ref 3.5–5.0)
ALK PHOS: 53 U/L (ref 38–126)
ALT: 20 U/L (ref 14–54)
ANION GAP: 10 (ref 5–15)
AST: 23 U/L (ref 15–41)
BUN: 14 mg/dL (ref 6–20)
CALCIUM: 9.7 mg/dL (ref 8.9–10.3)
CO2: 28 mmol/L (ref 22–32)
Chloride: 104 mmol/L (ref 101–111)
Creatinine, Ser: 0.57 mg/dL (ref 0.44–1.00)
GFR calc Af Amer: >60 mL/min (ref >60)
GLUCOSE: 114 mg/dL — AB (ref 65–99)
Potassium: 3.7 mmol/L (ref 3.5–5.1)
Sodium: 142 mmol/L (ref 135–145)
Total Bilirubin: 1.3 mg/dL — ABNORMAL HIGH (ref 0.3–1.2)
Total Protein: 6.9 g/dL (ref 6.5–8.1)

## 2017-07-18 LAB — CBC WITH DIFFERENTIAL/PLATELET
BASOS PCT: 0 %
Basophils Absolute: 0 10*3/uL (ref 0.0–0.1)
Eosinophils Absolute: 0.1 10*3/uL (ref 0.0–0.7)
Eosinophils Relative: 2 %
HEMATOCRIT: 44.6 % (ref 36.0–46.0)
HEMOGLOBIN: 14.9 g/dL (ref 12.0–15.0)
LYMPHS PCT: 27 %
Lymphs Abs: 1.9 10*3/uL (ref 0.7–4.0)
MCH: 31.5 pg (ref 26.0–34.0)
MCHC: 33.4 g/dL (ref 30.0–36.0)
MCV: 94.3 fL (ref 78.0–100.0)
MONO ABS: 0.4 10*3/uL (ref 0.1–1.0)
MONOS PCT: 6 %
NEUTROS ABS: 4.8 10*3/uL (ref 1.7–7.7)
NEUTROS PCT: 65 %
Platelets: 215 10*3/uL (ref 150–400)
RBC: 4.73 MIL/uL (ref 3.87–5.11)
RDW: 13.9 % (ref 11.5–15.5)
WBC: 7.3 10*3/uL (ref 4.0–10.5)

## 2017-07-18 NOTE — Progress Notes (Signed)
Location:   [facility] Room Number: 136/P Place of Service:  SNF [phone])  Provider: Granville Lewis  PCP: Sharilyn Sites, MD Patient Care Team: Sharilyn Sites, MD as PCP - [facility])  Extended Emergency Contact Information Primary Emergency Contact: Frei,Elvin Address: [address]          [place], [place] [address] Johnnette Litter of [place] Phone: [phone] Relation: Spouse Secondary Emergency Contact: Anson Fret [place] of [place] Phone: [phone] Relation: Daughter  Code Status: Full Code Goals of care:  Advanced Directive information Advanced Directives 07/18/2017  Does Patient Have a Medical Advance Directive? Yes  Type of Advance Directive (No Data)  Does patient want to make changes to medical advance directive? No - Patient declined  Copy of [place] in Chart? -  Would patient like information on creating a medical advance directive? -     Allergies  Allergen Reactions  . Sulfa Antibiotics Diarrhea and Itching    Chief Complaint  Patient presents with  . Discharge Note    Patient being seen for Discharge Visit    HPI:  67 y.o. female seen today for discharge from facility most likely tomorrow.  Patient has a history of hypertension as well as hyperlipidemia.  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  She was discharged to skilled nursing.  However she was readmitted to the hospital 11th with mental status changes admitted and underwent testing including a repeat MRI which showed a new diffusion positive area adjacent to the recent left basal ganglia and posterior partial infarcts.  She also was treated for UTI.  EEG showed possible seizure activity she was started on Keppra.  She has since been in skilled nursing  She is progressed  somewhat therapy but has significant right-sided hemiplegia.  She is also had some rising blood pressures.  She was taken off her antihypertensives in the hospital because of postural hypotension . wass also somewhat somnolent after being started on baclofen which  since been discontinued   She continues on aspirin and Plavix for the CVA and actually saw neurology earlier today with recommendation continue current medications.  She also has bracing for her right-sided extremities   Patient going home with her deficits is a big change for her and her husband-- it appears this is complicated because of insurance issues with insurance coverage ending for her being in skilled nursing.  She will need extensive home support including PT OT nursing and a CNA.  She will also need a hospital bed as well as a wheelchair bedside commode and a lift-- still has significant neurologic deficits on her right side  Regards her blood pressure Dr. Lyndel Safe did start her on low-dose Norvasc yesterday secondary to systolics in the high 086V.  Blood pressure today continues to be 190/96 however she has not received her morning medications because of her early neurology appointmen   t staff will be monitoring this again she has been started on low-dose Norvasc    She is not complaining of a headache or dizziness.     Past Medical History:  Diagnosis Date  . History of kidney stones    x1  . Hypertension     Past Surgical History:  Procedure Laterality Date  . CHOLECYSTECTOMY     laparoscopic  . COLONOSCOPY WITH PROPOFOL N/A 01/12/2015  Procedure: COLONOSCOPY WITH PROPOFOL;  Surgeon: Garlan Fair, MD;  Location: [facility];  Service: Endoscopy;  Laterality: N/A;  . TUBAL LIGATION        reports that she has never smoked. She has never used smokeless tobacco. She reports that she drinks alcohol. She reports that she does not use drugs. Social History   Socioeconomic History  . Marital  status: Married    Spouse name: Not on file  . Number of children: Not on file  . Years of education: Not on file  . Highest education level: Not on file  Occupational History  . Not on file  Social Needs  . Financial resource strain: Not on file  . Food insecurity:    Worry: Not on file    Inability: Not on file  . Transportation needs:    Medical: Not on file    Non-medical: Not on file  Tobacco Use  . Smoking status: Never Smoker  . Smokeless tobacco: Never Used  Substance and Sexual Activity  . Alcohol use: Yes    Comment: very rare  . Drug use: No  . Sexual activity: Not on file  Lifestyle  . Physical activity:    Days per week: Not on file    Minutes per session: Not on file  . Stress: Not on file  Relationships  . Social connections:    Talks on phone: Not on file    Gets together: Not on file    Attends religious service: Not on file    Active member of club or organization: Not on file    Attends meetings of clubs or organizations: Not on file    Relationship status: Not on file  . Intimate partner violence:    Fear of current or ex partner: Not on file    Emotionally abused: Not on file    Physically abused: Not on file    Forced sexual activity: Not on file  Other Topics Concern  . Not on file  Social History Narrative  . Not on file   Functional Status Survey:    Allergies  Allergen Reactions  . Sulfa Antibiotics Diarrhea and Itching    Pertinent  Health Maintenance Due  Topic Date Due  . PNA vac Low Risk Adult (1 of 2 - PCV13) 07/26/2017 (Originally 03/03/2013)  . INFLUENZA VACCINE  09/06/2017  . MAMMOGRAM  10/11/2018  . COLONOSCOPY  01/11/2025  . DEXA SCAN  Completed    Medications: Outpatient Encounter Medications as of 07/18/2017  Medication Sig  . acetaminophen (TYLENOL) 325 MG tablet Take 650 mg by mouth every 4 (four) hours as needed.  [name] amantadine (SYMMETREL) 100 MG capsule Take 2 capsules (200 mg total) by mouth 2 (two) times  daily.  [name] amLODipine (NORVASC) 5 MG tablet Take 5 mg by mouth daily.  [name] aspirin 81 MG chewable tablet Chew 1 tablet (81 mg total) by mouth daily.  [name] atorvastatin (LIPITOR) 40 MG tablet Take 1 tablet (40 mg total) by mouth daily.  . clopidogrel (PLAVIX) 75 MG tablet Take 1 tablet (75 mg total) by mouth daily.  [name] FLUoxetine (PROZAC) 40 MG capsule Take 1 capsule (40 mg total) by mouth at bedtime.  . levETIRAcetam (KEPPRA) 1000 MG tablet Take 1 tablet (1,000 mg total) by mouth 2 (two) times daily.  . metFORMIN (GLUCOPHAGE) 500 MG tablet Take 1 tablet (500 mg total) by mouth daily with breakfast.  . nystatin (NYSTATIN) powder Apply topically See admin instructions. Apply topically to  groin and outer vaginal area twice daily prn for rash/irritation  . ondansetron (ZOFRAN) 4 MG tablet Take 4 mg by mouth every 8 (eight) hours as needed for nausea or vomiting.  . pantoprazole (PROTONIX) 40 MG tablet Take 1 tablet (40 mg total) by mouth daily.  . polyethylene glycol (MIRALAX / GLYCOLAX) packet Take 17 g by mouth daily. Mix with 8-10 oz water and drink  . traMADol (ULTRAM) 50 MG tablet Take 0.5 tablets (25 mg total) by mouth every 6 (six) hours as needed.   No facility-administered encounter medications on file as of 07/18/2017.     Review of Systems   In general she is not complaining of fever chills says still has a poor appetite a week  Skin is not complaining of rashes or itching.  Head ears eyes nose mouth and throat is not complain of visual changes or sore throat but says her sense of taste has not been very good since the CVA   respiratory is not complaining of shortness of breath or cough  Cardiac is not complaining of chest pain or palpitations.  GU is not complaining of dysuria.  Musculoskeletal continues with significant weakness with right-sided hemiparalysis is not complaining of joint pain at this time   neurologic again does have significant right-sided deficits does not complain  of headache or dizziness or syncope.  Psych-continues to have some anxiety about going home    Vitals:  She is afebrile pulse is 76 respirations of 18 blood pressure taken manually 190/96  Physical Exam In general this is a pleasant elderly female does not appear to be in any e distress but does appear to be somewhat subdued.  Her skin is warm and dry.  Oropharynx is clear mucous membranes moist.  Eyes pupils appear reactive light sclera and conjunctive are clear.  Chest is clear to auscultation there is no labored breathing.  Heart is regular rate and rhythm without murmur gallop or rub.  Abdomen is soft does not appear to be tender there are positive bowel sounds.  Musculoskeletal continues with right-sided hemiparalysis is able to move her left upper and lower extremities appears at relative baseline.  She does have her right leg support on she also has her right arm supported on the wheelchair.  Neurologic as noted above her right sided hemiparesis.  Speech is clear.  Psych she is alert and oriented he is somewhat subdued bit anxious about going home   Labs reviewed: Basic Metabolic Panel: Recent Labs    06/16/17 1848  06/21/17 0441 06/22/17 0342  06/25/17 0700 07/02/17 0400 07/18/17 0707  NA  --    < > 141 141   < > 138 138 142  K  --    < > 2.9* 3.4*   < > 3.8 3.5 3.7  CL  --    < > 106 109   < > 101 103 104  CO2  --    < > 23 24   < > 27 25 28   GLUCOSE  --    < > 116* 114*   < > 115* 98 114*  BUN  --    < > <5* <5*   < > 10 10 14   CREATININE  --    < > 0.48 0.46   < > 0.53 0.68 0.57  CALCIUM  --    < > 8.9 8.9   < > 9.2 9.4 9.7  MG 2.0  --  1.4* 1.9  --   --   --   --    < > =  values in this interval not displayed.   Liver Function Tests: Recent Labs    04/25/17 0459 06/16/17 1459 07/18/17 0707  AST 31 29 23   ALT 43 26 20  ALKPHOS 82 82 53  BILITOT 0.9 1.2 1.3*  PROT 6.9 6.8 6.9  ALBUMIN 3.2* 3.6 3.9   No results for input(s): LIPASE,  AMYLASE in the last 8760 hours. No results for input(s): AMMONIA in the last 8760 hours. CBC: Recent Labs    06/16/17 1330  06/23/17 0900 07/02/17 0400 07/18/17 0707  WBC 11.9*   < > 7.2 8.4 7.3  NEUTROABS 7.8*  --  4.5  --  4.8  HGB 16.3*   < > 13.5 13.3 14.9  HCT 45.4   < > 39.3 39.1 44.6  MCV 89.2   < > 91.0 92.7 94.3  PLT 347   < > 253 285 215   < > = values in this interval not displayed.   Cardiac Enzymes: No results for input(s): CKTOTAL, CKMB, CKMBINDEX, TROPONINI in the last 8760 hours. BNP: Invalid input(s): POCBNP CBG: Recent Labs    06/21/17 2157 06/22/17 0616 06/22/17 1152  GLUCAP 111* 106* 129*    Procedures and Imaging Studies During Stay: Dg Shoulder Right  Result Date: 06/20/2017 CLINICAL DATA:  Status post fall.  History of previous CVA. EXAM: RIGHT SHOULDER - 2+ VIEW COMPARISON:  Limited views of the right shoulder from a chest x-ray of April 16, 2017. FINDINGS: The bones of the right shoulder are subjectively adequately mineralized. There is no acute fracture or dislocation. There is subjective mild narrowing of the glenohumeral joint. The [facility] joint is reasonably well-maintained. The subacromial subdeltoid space is normal. IMPRESSION: There is no acute bony abnormality of the right shoulder. I can't exclude mild degenerative narrowing of the glenohumeral joint. Electronically Signed   By: David  Martinique M.D.   On: 06/20/2017 13:22   Dg Elbow 2 Views Right  Result Date: 06/20/2017 CLINICAL DATA:  Status post fall.  History of previous CVA EXAM: RIGHT ELBOW - 2 VIEW COMPARISON:  None in PACs FINDINGS: The bones of the elbow are subjectively adequately mineralized. There is no acute fracture nor dislocation. There is no joint effusion. The overlying soft tissues exhibit no acute abnormalities. IMPRESSION: There is no acute or significant chronic bony abnormality of the right elbow. Electronically Signed   By: David  Martinique M.D.   On: 06/20/2017 13:17     Assessment/Plan:    #1 hypertension-again she has been started on Norvasc 5 mg a day has not received it before her blood pressure reading this morning since she had been at an neurology appointment- at this point will monitor she does have postural hypotension in the past she will need expedient follow-up by primary care provider. Update--- blood pressure has come down to systolics in the [id number] this point will monitor- suspect it would continue to come down further--but will have to be monitored   2.  History of somnolence her baclofen has been discontinued lab work today was r  #3 history of acute encephalopathy this was thought caused by extension of her CVA as well as dealing with a UTI- she has done better she continues to be pleasant and appropriate and is working with therapy--but continues to have significant weakness.  4--possible seizure disorder status post CVA-- she is on Keppra this appears to be stable   #5--Left MCA stroke with right-sided hemiparesis- continues on aspirin Plavix-she has seen neurology with recommendation to continue this.  She also follows with rehab for Botox injections  6- prediabetes with an A1c of 6.4-her sugars appear to be well controlled on Glucophage once a day- sugars are largely in the low 100s   7 hyperlipidemia Lipitor has been increased up to 40 mg--LDL was 107  8.  Depression she is on Prozac and amantadine issue --complicated with her recent CVA this will need follow-up by primary care provider  #9 history of GERD she continues on a PPI.  Again secondary to insurance issues it appears patient will be discharged tomorrow she will need extensive support with right-sided paralysis continue-  will need PT OT nursing support and a CNA as well as a lift--- also will need a hospital bed secondary to her limited mobility wheelchair and bedside commode so will need primary care follow-up expediently and neurologic  follow-up.  CPT--99316-greater than 30 minutes spent on this discharge summary greater than 50% of time spent coordinating a plan of care for numerous diagnoses

## 2017-07-18 NOTE — Patient Instructions (Signed)
Continue aspirin 81 mg daily and clopidogrel 75 mg daily  and lipitor  for secondary stroke prevention  Continue to do physical, occupational, and speech therapy  Continue to follow up with PCP regarding cholesterol and blood pressure management   Continue botox injections with Dr. Posey Pronto for right sided stiffness   Continue to monitor blood pressure at home  Maintain strict control of hypertension with blood pressure goal below 130/90, diabetes with hemoglobin A1c goal below 6.5% and cholesterol with LDL cholesterol (bad cholesterol) goal below 70 mg/dL. I also advised the patient to eat a healthy diet with plenty of whole grains, cereals, fruits and vegetables, exercise regularly and maintain ideal body weight.  Followup in the future with me in 3 months or call earlier if needed       Thank you for coming to see [place] at [facility]. I hope we have been able to provide you high quality care today.  You may receive a patient satisfaction survey over the next few weeks. We would appreciate your feedback and comments so that we may continue to improve ourselves and the health of our patients.

## 2017-07-18 NOTE — Progress Notes (Signed)
I agree with the above plan 

## 2017-07-21 LAB — URINE CULTURE

## 2017-08-01 ENCOUNTER — Encounter (HOSPITAL_COMMUNITY): Payer: Self-pay

## 2017-08-01 ENCOUNTER — Emergency Department (HOSPITAL_COMMUNITY): Payer: Medicare Other

## 2017-08-01 ENCOUNTER — Inpatient Hospital Stay (HOSPITAL_COMMUNITY)
Admission: EM | Admit: 2017-08-01 | Discharge: 2017-08-07 | DRG: 057 | Disposition: A | Discharge status: Home / Self Care | Payer: Medicare Other | Attending: Internal Medicine | Admitting: Internal Medicine

## 2017-08-01 ENCOUNTER — Other Ambulatory Visit: Payer: Self-pay

## 2017-08-01 DIAGNOSIS — Z7984 Long term (current) use of oral hypoglycemic drugs: Secondary | ICD-10-CM

## 2017-08-01 DIAGNOSIS — Z7982 Long term (current) use of aspirin: Secondary | ICD-10-CM

## 2017-08-01 DIAGNOSIS — Z66 Do not resuscitate: Secondary | ICD-10-CM | POA: Diagnosis present

## 2017-08-01 DIAGNOSIS — E876 Hypokalemia: Secondary | ICD-10-CM | POA: Diagnosis present

## 2017-08-01 DIAGNOSIS — Z882 Allergy status to sulfonamides status: Secondary | ICD-10-CM | POA: Diagnosis not present

## 2017-08-01 DIAGNOSIS — R569 Unspecified convulsions: Secondary | ICD-10-CM

## 2017-08-01 DIAGNOSIS — I639 Cerebral infarction, unspecified: Secondary | ICD-10-CM | POA: Diagnosis not present

## 2017-08-01 DIAGNOSIS — Z833 Family history of diabetes mellitus: Secondary | ICD-10-CM | POA: Diagnosis not present

## 2017-08-01 DIAGNOSIS — G40909 Epilepsy, unspecified, not intractable, without status epilepticus: Secondary | ICD-10-CM | POA: Diagnosis present

## 2017-08-01 DIAGNOSIS — E1159 Type 2 diabetes mellitus with other circulatory complications: Secondary | ICD-10-CM | POA: Diagnosis not present

## 2017-08-01 DIAGNOSIS — Z79899 Other long term (current) drug therapy: Secondary | ICD-10-CM

## 2017-08-01 DIAGNOSIS — Z87442 Personal history of urinary calculi: Secondary | ICD-10-CM

## 2017-08-01 DIAGNOSIS — Z87440 Personal history of urinary (tract) infections: Secondary | ICD-10-CM | POA: Diagnosis not present

## 2017-08-01 DIAGNOSIS — Z8249 Family history of ischemic heart disease and other diseases of the circulatory system: Secondary | ICD-10-CM

## 2017-08-01 DIAGNOSIS — F329 Major depressive disorder, single episode, unspecified: Secondary | ICD-10-CM | POA: Diagnosis present

## 2017-08-01 DIAGNOSIS — I69359 Hemiplegia and hemiparesis following cerebral infarction affecting unspecified side: Secondary | ICD-10-CM | POA: Diagnosis not present

## 2017-08-01 DIAGNOSIS — F419 Anxiety disorder, unspecified: Secondary | ICD-10-CM | POA: Diagnosis present

## 2017-08-01 DIAGNOSIS — Z7189 Other specified counseling: Secondary | ICD-10-CM

## 2017-08-01 DIAGNOSIS — I69351 Hemiplegia and hemiparesis following cerebral infarction affecting right dominant side: Secondary | ICD-10-CM | POA: Diagnosis not present

## 2017-08-01 DIAGNOSIS — Z7902 Long term (current) use of antithrombotics/antiplatelets: Secondary | ICD-10-CM | POA: Diagnosis not present

## 2017-08-01 DIAGNOSIS — I10 Essential (primary) hypertension: Secondary | ICD-10-CM | POA: Diagnosis present

## 2017-08-01 DIAGNOSIS — E785 Hyperlipidemia, unspecified: Secondary | ICD-10-CM | POA: Diagnosis present

## 2017-08-01 DIAGNOSIS — Z7401 Bed confinement status: Secondary | ICD-10-CM

## 2017-08-01 DIAGNOSIS — I69320 Aphasia following cerebral infarction: Secondary | ICD-10-CM

## 2017-08-01 DIAGNOSIS — E440 Moderate protein-calorie malnutrition: Secondary | ICD-10-CM

## 2017-08-01 DIAGNOSIS — G8191 Hemiplegia, unspecified affecting right dominant side: Secondary | ICD-10-CM | POA: Diagnosis not present

## 2017-08-01 DIAGNOSIS — I69398 Other sequelae of cerebral infarction: Secondary | ICD-10-CM | POA: Diagnosis not present

## 2017-08-01 DIAGNOSIS — R4182 Altered mental status, unspecified: Secondary | ICD-10-CM | POA: Diagnosis present

## 2017-08-01 DIAGNOSIS — E1151 Type 2 diabetes mellitus with diabetic peripheral angiopathy without gangrene: Secondary | ICD-10-CM | POA: Diagnosis present

## 2017-08-01 DIAGNOSIS — Z515 Encounter for palliative care: Secondary | ICD-10-CM

## 2017-08-01 LAB — COMPREHENSIVE METABOLIC PANEL
ALT: <5 U/L (ref 0–44)
AST: 35 U/L (ref 15–41)
Albumin: 4.5 g/dL (ref 3.5–5.0)
Alkaline Phosphatase: 62 U/L (ref 38–126)
Anion gap: 20 — ABNORMAL HIGH (ref 5–15)
BUN: 19 mg/dL (ref 8–23)
CO2: 17 mmol/L — ABNORMAL LOW (ref 22–32)
Calcium: 9.8 mg/dL (ref 8.9–10.3)
Chloride: 104 mmol/L (ref 98–111)
Creatinine, Ser: 0.78 mg/dL (ref 0.44–1.00)
GFR calc Af Amer: >60 mL/min (ref >60)
GFR calc non Af Amer: >60 mL/min (ref >60)
Glucose, Bld: 173 mg/dL — ABNORMAL HIGH (ref 70–99)
Potassium: 3.8 mmol/L (ref 3.5–5.1)
Sodium: 141 mmol/L (ref 135–145)
Total Bilirubin: 1.2 mg/dL (ref 0.3–1.2)
Total Protein: 8.1 g/dL (ref 6.5–8.1)

## 2017-08-01 LAB — URINALYSIS, ROUTINE W REFLEX MICROSCOPIC
Bilirubin Urine: NEGATIVE
Glucose, UA: 50 mg/dL — AB
Hgb urine dipstick: NEGATIVE
Ketones, ur: 80 mg/dL — AB
Leukocytes, UA: NEGATIVE
Nitrite: NEGATIVE
Protein, ur: 100 mg/dL — AB
Specific Gravity, Urine: 1.018 (ref 1.005–1.030)
pH: 5 (ref 5.0–8.0)

## 2017-08-01 LAB — CBC WITH DIFFERENTIAL/PLATELET
Basophils Absolute: 0.1 10*3/uL (ref 0.0–0.1)
Basophils Relative: 0 %
Eosinophils Absolute: 0.2 10*3/uL (ref 0.0–0.7)
Eosinophils Relative: 1 %
HCT: 47.4 % — ABNORMAL HIGH (ref 36.0–46.0)
Hemoglobin: 16.6 g/dL — ABNORMAL HIGH (ref 12.0–15.0)
Lymphocytes Relative: 49 %
Lymphs Abs: 9.4 10*3/uL — ABNORMAL HIGH (ref 0.7–4.0)
MCH: 32.4 pg (ref 26.0–34.0)
MCHC: 35 g/dL (ref 30.0–36.0)
MCV: 92.6 fL (ref 78.0–100.0)
Monocytes Absolute: 1.1 10*3/uL — ABNORMAL HIGH (ref 0.1–1.0)
Monocytes Relative: 6 %
Neutro Abs: 8.4 10*3/uL — ABNORMAL HIGH (ref 1.7–7.7)
Neutrophils Relative %: 44 %
Platelets: 332 10*3/uL (ref 150–400)
RBC: 5.12 MIL/uL — ABNORMAL HIGH (ref 3.87–5.11)
RDW: 13.1 % (ref 11.5–15.5)
WBC: 19.1 10*3/uL — ABNORMAL HIGH (ref 4.0–10.5)

## 2017-08-01 LAB — MAGNESIUM: Magnesium: 1.8 mg/dL (ref 1.7–2.4)

## 2017-08-01 LAB — I-STAT CG4 LACTIC ACID, ED
Lactic Acid, Venous: 10.72 mmol/L (ref 0.5–1.9)
Lactic Acid, Venous: 1.61 mmol/L (ref 0.5–1.9)

## 2017-08-01 LAB — TROPONIN I: Troponin I: <0.03 ng/mL (ref <0.03)

## 2017-08-01 MED ORDER — LEVETIRACETAM 500 MG PO TABS: 1000.0000 mg | ORAL_TABLET | Freq: Two times a day (BID) | ORAL | Status: DC

## 2017-08-01 MED ORDER — PANTOPRAZOLE SODIUM 40 MG PO TBEC
40.0000 mg | DELAYED_RELEASE_TABLET | Freq: Every day | ORAL | Status: DC
  Administered 2017-08-04 – 2017-08-07 (×4): 40 mg via ORAL
  Filled 2017-08-01 (×4): qty 1

## 2017-08-01 MED ORDER — CLOPIDOGREL BISULFATE 75 MG PO TABS
75.0000 mg | ORAL_TABLET | Freq: Every day | ORAL | Status: DC
  Administered 2017-08-04 – 2017-08-07 (×4): 75 mg via ORAL
  Filled 2017-08-01 (×4): qty 1

## 2017-08-01 MED ORDER — INSULIN ASPART 100 UNIT/ML ~~LOC~~ SOLN
0.0000 [IU] | Freq: Three times a day (TID) | SUBCUTANEOUS | Status: DC
  Administered 2017-08-06: 3 [IU] via SUBCUTANEOUS
  Administered 2017-08-07: 2 [IU] via SUBCUTANEOUS

## 2017-08-01 MED ORDER — HYDRALAZINE HCL 20 MG/ML IJ SOLN
5.0000 mg | Freq: Four times a day (QID) | INTRAMUSCULAR | Status: DC | PRN
  Administered 2017-08-02 – 2017-08-04 (×5): 5 mg via INTRAVENOUS
  Filled 2017-08-01 (×5): qty 1

## 2017-08-01 MED ORDER — ASPIRIN 81 MG PO CHEW
81.0000 mg | CHEWABLE_TABLET | Freq: Every day | ORAL | Status: DC
  Administered 2017-08-04 – 2017-08-07 (×4): 81 mg via ORAL
  Filled 2017-08-01 (×4): qty 1

## 2017-08-01 MED ORDER — ATORVASTATIN CALCIUM 40 MG PO TABS
40.0000 mg | ORAL_TABLET | Freq: Every day | ORAL | Status: DC
  Administered 2017-08-04 – 2017-08-07 (×4): 40 mg via ORAL
  Filled 2017-08-01 (×4): qty 1

## 2017-08-01 MED ORDER — LEVETIRACETAM IN NACL 1000 MG/100ML IV SOLN
1000.0000 mg | Freq: Once | INTRAVENOUS | Status: AC
  Administered 2017-08-01: 1000 mg via INTRAVENOUS
  Filled 2017-08-01: qty 100

## 2017-08-01 MED ORDER — ONDANSETRON HCL 4 MG/2ML IJ SOLN: 4.0000 mg | Freq: Four times a day (QID) | INTRAMUSCULAR | Status: DC | PRN

## 2017-08-01 MED ORDER — AMLODIPINE BESYLATE 5 MG PO TABS
5.0000 mg | ORAL_TABLET | Freq: Every day | ORAL | Status: DC
  Administered 2017-08-04 – 2017-08-06 (×3): 5 mg via ORAL
  Filled 2017-08-01 (×3): qty 1

## 2017-08-01 MED ORDER — FLUOXETINE HCL 20 MG PO CAPS
40.0000 mg | ORAL_CAPSULE | Freq: Every day | ORAL | Status: DC
  Administered 2017-08-04 – 2017-08-06 (×3): 40 mg via ORAL
  Filled 2017-08-01 (×3): qty 2

## 2017-08-01 MED ORDER — ONDANSETRON HCL 4 MG PO TABS: 4.0000 mg | ORAL_TABLET | Freq: Four times a day (QID) | ORAL | Status: DC | PRN

## 2017-08-01 MED ORDER — SODIUM CHLORIDE 0.9 % IV SOLN
INTRAVENOUS | Status: DC
  Administered 2017-08-01 – 2017-08-02 (×2): via INTRAVENOUS

## 2017-08-01 MED ORDER — AMANTADINE HCL 100 MG PO CAPS
200.0000 mg | ORAL_CAPSULE | Freq: Two times a day (BID) | ORAL | Status: DC
  Administered 2017-08-04 – 2017-08-07 (×7): 200 mg via ORAL
  Filled 2017-08-01 (×14): qty 2

## 2017-08-01 MED ORDER — LORAZEPAM 2 MG/ML IJ SOLN: 1.0000 mg | Freq: Once | INTRAMUSCULAR | Status: DC

## 2017-08-01 NOTE — ED Provider Notes (Signed)
[facility] EMERGENCY DEPARTMENT Provider Note   CSN: [id number] Arrival date & time: 08/01/17  2016     History   Chief Complaint Chief Complaint  Patient presents with  . Altered Mental Status    HPI Ebony Williams is a 67 y.o. female.  HPI   67 year old female with altered mental status.  On EMS arrival patient appeared to be confused.  She was attempting to respond to questions verbally but her speech was garbled.  Shortly after their arrival she began having generalized tonic-clonic activity.  She was given 5 mg total of Versed.  Glucose in 100s. She arrived to the emergency room in a post-ictal state with snoring respirations.   Past Medical History:  Diagnosis Date  . History of kidney stones    x1  . Hypertension     Patient Active Problem List   Diagnosis Date Noted  . Right arm weakness 06/16/2017  . Aphasia 06/16/2017  . Spastic hemiplegia of right dominant side as late effect of cerebral infarction ([place])   . Hyponatremia   . Labile blood pressure   . Hypokalemia   . Elevated BUN   . Lethargy   . Transaminitis   . Essential hypertension   . Hypertensive crisis   . Left middle cerebral artery stroke ([place]) 04/11/2017  . Depression   . Dysphagia, post-stroke   . Diastolic dysfunction   . Prediabetes   . Leukocytosis   . Cerebral infarction due to occlusion of left middle cerebral artery ([place]) s/p IV tPA 04/06/2017  . Stroke (cerebrum) ([place]) 04/06/2017  . 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 Pain in joint, shoulder region 10/18/2011    Past Surgical History:  Procedure Laterality Date  . CHOLECYSTECTOMY     laparoscopic  . COLONOSCOPY WITH PROPOFOL N/A 01/12/2015   Procedure: COLONOSCOPY WITH PROPOFOL;  Surgeon: Garlan Fair, MD;  Location: [facility];  Service: Endoscopy;  Laterality: N/A;  . TUBAL LIGATION       OB History   None      Home Medications    Prior to Admission medications   Medication Sig Start Date End Date Taking?  Authorizing Provider  acetaminophen (TYLENOL) 325 MG tablet Take 650 mg by mouth every 4 (four) hours as needed.    [provider]  amantadine (SYMMETREL) 100 MG capsule Take 2 capsules (200 mg total) by mouth 2 (two) times daily. 05/08/17   Angiulli, Lavon Paganini, PA-C  amLODipine (NORVASC) 5 MG tablet Take 5 mg by mouth daily.    [provider]  aspirin 81 MG chewable tablet Chew 1 tablet (81 mg total) by mouth daily. 05/08/17   Angiulli, Lavon Paganini, PA-C  atorvastatin (LIPITOR) 40 MG tablet Take 1 tablet (40 mg total) by mouth daily. 06/22/17   Patrecia Pour, MD  clopidogrel (PLAVIX) 75 MG tablet Take 1 tablet (75 mg total) by mouth daily. 05/08/17   Angiulli, Lavon Paganini, PA-C  FLUoxetine (PROZAC) 40 MG capsule Take 1 capsule (40 mg total) by mouth at bedtime. 06/22/17   Patrecia Pour, MD  levETIRAcetam (KEPPRA) 1000 MG tablet Take 1 tablet (1,000 mg total) by mouth 2 (two) times daily. 06/22/17   Patrecia Pour, MD  metFORMIN (GLUCOPHAGE) 500 MG tablet Take 1 tablet (500 mg total) by mouth daily with breakfast. 05/08/17   Angiulli, Lavon Paganini, PA-C  nystatin (NYSTATIN) powder Apply topically See admin instructions. Apply topically to groin and outer vaginal area twice daily prn for rash/irritation  [provider]  ondansetron (ZOFRAN) 4 MG tablet Take 4 mg by mouth every 8 (eight) hours as needed for nausea or vomiting.    [provider]  pantoprazole (PROTONIX) 40 MG tablet Take 1 tablet (40 mg total) by mouth daily. 05/07/17   Angiulli, Lavon Paganini, PA-C  polyethylene glycol (MIRALAX / GLYCOLAX) packet Take 17 g by mouth daily. Mix with 8-10 oz water and drink    [provider]  traMADol (ULTRAM) 50 MG tablet Take 0.5 tablets (25 mg total) by mouth every 6 (six) hours as needed. 07/11/17   Wille Celeste, PA-C    Family History Family History  Problem Relation Age of Onset  . Diabetes Mother   . Hypertension Mother     Social History Social History   Tobacco  Use  . Smoking status: Never Smoker  . Smokeless tobacco: Never Used  Substance Use Topics  . Alcohol use: Yes    Comment: very rare  . Drug use: No     Allergies   Sulfa antibiotics   Review of Systems Review of Systems  Level 5 caveat because of postictal state. Physical Exam Updated Vital Signs There were no vitals taken for this visit.  Physical Exam  Constitutional: She appears well-developed and well-nourished. She appears distressed.  Laying in bed.  Unresponsive.  Snoring respirations.  HENT:  Head: Normocephalic and atraumatic.  Eyes: Conjunctivae are normal. Right eye exhibits no discharge. Left eye exhibits no discharge.  Neck: Neck supple.  Cardiovascular: Regular rhythm and normal heart sounds. Exam reveals no gallop and no friction rub.  No murmur heard. Very tachycardic  Pulmonary/Chest: Effort normal and breath sounds normal. No respiratory distress.  Abdominal: Soft. She exhibits no distension. There is no tenderness.  Musculoskeletal: She exhibits no edema or tenderness.  Neurological:  Eyes closed.  Pupils approximately 4 mm and reactive bilaterally.  Nonverbal.  No obvious facial droop.  Decreased tone right side as compared to the left.  No inducible clonus.  Skin: Skin is warm and dry.  Nursing note and vitals reviewed.    ED Treatments / Results  Labs (all labs ordered are listed, but only abnormal results are displayed) Labs Reviewed  CBC WITH DIFFERENTIAL/PLATELET - Abnormal; Notable for the following components:      Result Value   WBC 19.1 (*)    RBC 5.12 (*)    Hemoglobin 16.6 (*)    HCT 47.4 (*)    Neutro Abs 8.4 (*)    Lymphs Abs 9.4 (*)    Monocytes Absolute 1.1 (*)    All other components within normal limits  COMPREHENSIVE METABOLIC PANEL - Abnormal; Notable for the following components:   CO2 17 (*)    Glucose, Bld 173 (*)    Anion gap 20 (*)    All other components within normal limits  I-STAT CG4 LACTIC ACID, ED -  Abnormal; Notable for the following components:   Lactic Acid, Venous 10.72 (*)    All other components within normal limits  MAGNESIUM  TROPONIN I  URINALYSIS, ROUTINE W REFLEX MICROSCOPIC  I-STAT CG4 LACTIC ACID, ED    EKG EKG Interpretation  Date/Time:  Wednesday August 01 2017 20:25:34 EDT Ventricular Rate:  159 PR Interval:    QRS Duration: 118 QT Interval:  335 QTC Calculation: 545 R Axis:   -59 Text Interpretation:  sinus tachycardia Left anterior fascicular block Baseline wander in lead(s) V5 V6 Confirmed by Virgel Manifold [phone]) on 08/01/2017 8:59:24 PM  Radiology Ct Head Wo Contrast  Result Date: 08/01/2017 CLINICAL DATA:  Altered level of consciousness. New onset seizure. Recent CVA. EXAM: CT HEAD WITHOUT CONTRAST TECHNIQUE: Contiguous axial images were obtained from the base of the skull through the vertex without intravenous contrast. COMPARISON:  04/16/2017 head CT.  06/16/2017 prior MRI. FINDINGS: Brain: Continued evolution of encephalomalacia in left basal ganglia and left parieto-occipital lobe. No evidence of parenchymal hemorrhage or extra-axial fluid collection. No mass lesion, mass effect, or midline shift. No CT evidence of acute infarction. Nonspecific moderate subcortical and periventricular white matter hypodensity, most in keeping with chronic small vessel ischemic change. Cerebral volume is age appropriate. Ex vacuo dilatation of the occipital and frontal horns of the left lateral ventricle. No acute ventriculomegaly. Vascular: No acute abnormality. Skull: No evidence of calvarial fracture. Sinuses/Orbits: No fluid levels. Mild mucoperiosteal thickening and/or mucous retention cysts versus polyps in the dependent left maxillary sinus. Other:  The mastoid air cells are unopacified. IMPRESSION: 1.  No evidence of acute intracranial abnormality. 2. Continued evolution of encephalomalacia in the left basal ganglia and left parieto-occipital lobe. 3. Moderate chronic  small vessel ischemic changes in the cerebral white matter. Electronically Signed   By: Ilona Sorrel M.D.   On: 08/01/2017 21:14    Procedures Procedures (including critical care time)  Medications Ordered in ED Medications  0.9 %  sodium chloride infusion ( Intravenous New Bag/Given 08/01/17 2051)  levETIRAcetam (KEPPRA) IVPB 1000 mg/100 mL premix (0 mg Intravenous Stopped 08/01/17 2051)     Initial Impression / Assessment and Plan / ED Course  I have reviewed the triage vital signs and the nursing notes.  Pertinent labs & imaging results that were available during my care of the patient were reviewed by me and considered in my medical decision making (see chart for details).  Clinical Course as of Aug 01 2129  Wed Aug 01, 2017  2130 Pt now purposeful (pulling at bed sheets) but still very confused. CT head w/o acute findings.   [SK]    Clinical Course User Index [SK] Virgel Manifold, MD    67 year old female with likely seizure at home.  Clinical picture is consistent with this.  Appears to be postictal although mental status is slowly improving.  CT the head shows evidence of prior stroke with no acute findings.  She was given IV Keppra here in the emergency room.  Suspect we will end up admitting for observation until she is back to her baseline mental status.  Final Clinical Impressions(s) / ED Diagnoses   Final diagnoses:  Seizure [facility])    ED Discharge Orders    None       Virgel Manifold, MD 08/06/17 [phone]

## 2017-08-01 NOTE — ED Notes (Signed)
Patient on 12 lead at this time. EKG done and seen by Dr Wilson Singer

## 2017-08-01 NOTE — ED Triage Notes (Signed)
Pt in by ems for altered mental status and seizure activity.  Symptoms started approx 3:30 pm when pt started slurring her speech and not making sense per family members.  Per ems, pt had seizures en route and now is posturing on arrival to the e.d.  Pt is unresponsive at this time

## 2017-08-01 NOTE — ED Notes (Signed)
Patient had seizure pads placed previously but has had no seizure activity. Seizure pads removed.

## 2017-08-01 NOTE — ED Notes (Signed)
Family states patient had a stroke in May that caused right sided paralysis and slurred speech. Family witness seizure like activity today. Patient is opening her eyes at this time and moving her left arm and left leg.

## 2017-08-01 NOTE — H&P (Signed)
TRH H&P    Patient Demographics:    Ebony Williams, is a 67 y.o. female  MRN: [id number]  DOB - 04/01/1950  Admit Date - 08/01/2017  Referring MD/NP/PA: Dr. Wilson Singer  Outpatient Primary MD for the patient is Sharilyn Sites, MD  Patient coming from: Home  Chief complaint-altered mental status   HPI:    Ebony Williams  is a 67 y.o. 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  As per note from the EMS patient appeared to be confused and was not attempting to respond to questions verbally but her speech was garbled.  Shortly after their arrival she began having generalized tonic-clonic activity and was given 5 mg of Versed. When patient arrived in the ED she was somnolent, postictal. As per husband there has been no biting of tongue, no loss of bladder control. No recent illness, patient has been taking her medications as prescribed. No chest pain or shortness of breath No nausea vomiting or diarrhea No dysuria, urgency frequency of urination.  Patient is usually bedbound due to right-sided hemiplegia, she did work with physical therapy this morning.    Review of systems:      All other systems reviewed and are negative.   With Past History of the following :    Past Medical History:  Diagnosis Date  . History of kidney stones    x1  . Hypertension       Past Surgical History:  Procedure Laterality Date  . CHOLECYSTECTOMY     laparoscopic  . COLONOSCOPY WITH PROPOFOL N/A 01/12/2015   Procedure: COLONOSCOPY WITH PROPOFOL;  Surgeon: Garlan Fair, MD;  Location: [facility];  Service: Endoscopy;  Laterality: N/A;  . TUBAL LIGATION         Social History:      Social History   Tobacco Use  . Smoking status: Never Smoker  . Smokeless tobacco: Never Used  Substance Use Topics  . Alcohol use: Yes    Comment: very rare       Family History :     Family History  Problem Relation Age of Onset  . Diabetes Mother   . Hypertension Mother       Home Medications:   Prior to Admission medications   Medication Sig Start Date End Date Taking? Authorizing Provider  acetaminophen (TYLENOL) 325 MG tablet Take 325-650 mg by mouth daily as needed for mild pain or moderate pain.    Yes [provider]  ALPRAZolam (XANAX) 0.25 MG tablet Take 0.25 mg by mouth at bedtime as needed for sleep.  07/26/17  Yes [provider]  nitrofurantoin, macrocrystal-monohydrate, (MACROBID) 100 MG capsule Take 100 mg by mouth 2 (two) times daily. 07/30/17  Yes [provider]  amantadine (SYMMETREL) 100 MG capsule Take 2 capsules (200 mg total) by mouth 2 (two) times  daily. 05/08/17   Angiulli, Lavon Paganini, PA-C  amLODipine (NORVASC) 5 MG tablet Take 5 mg by mouth daily.    [provider]  aspirin 81 MG chewable tablet Chew 1 tablet (81 mg total) by mouth daily. 05/08/17   Angiulli, Lavon Paganini, PA-C  atorvastatin (LIPITOR) 40 MG tablet Take 1 tablet (40 mg total) by mouth daily. 06/22/17   Patrecia Pour, MD  clopidogrel (PLAVIX) 75 MG tablet Take 1 tablet (75 mg total) by mouth daily. 05/08/17   Angiulli, Lavon Paganini, PA-C  FLUoxetine (PROZAC) 40 MG capsule Take 1 capsule (40 mg total) by mouth at bedtime. 06/22/17   Patrecia Pour, MD  levETIRAcetam (KEPPRA) 1000 MG tablet Take 1 tablet (1,000 mg total) by mouth 2 (two) times daily. 06/22/17   Patrecia Pour, MD  metFORMIN (GLUCOPHAGE) 500 MG tablet Take 1 tablet (500 mg total) by mouth daily with breakfast. 05/08/17   Angiulli, Lavon Paganini, PA-C  nystatin (NYSTATIN) powder Apply topically See admin instructions. Apply topically to groin and outer vaginal area twice daily prn for  rash/irritation    [provider]  ondansetron (ZOFRAN) 4 MG tablet Take 4 mg by mouth every 8 (eight) hours as needed for nausea or vomiting.    [provider]  pantoprazole (PROTONIX) 40 MG tablet Take 1 tablet (40 mg total) by mouth daily. 05/07/17   Angiulli, Lavon Paganini, PA-C  polyethylene glycol (MIRALAX / GLYCOLAX) packet Take 17 g by mouth daily. Mix with 8-10 oz water and drink    [provider]  traMADol (ULTRAM) 50 MG tablet Take 0.5 tablets (25 mg total) by mouth every 6 (six) hours as needed. 07/11/17   Wille Celeste, PA-C     Allergies:     Allergies  Allergen Reactions  . Sulfa Antibiotics Diarrhea and Itching     Physical Exam:   Vitals  Blood pressure (!) 186/100, pulse (!) 146, temperature 99.3 F (37.4 C), temperature source Rectal, resp. rate (!) 28, SpO2 96 %.  1.  General: Appears somnolent  2. Psychiatric: Not tested, patient is somnolent  3. Neurologic: Moving left side, dense hemiplegia right side  4. Eyes :  anicteric sclerae, moist conjunctivae with no lid lag.   5. ENMT:  Oropharynx clear with moist mucous membranes and good dentition  6. Neck:  supple, no cervical lymphadenopathy appriciated, No thyromegaly  7. Respiratory : Normal respiratory effort, good air movement bilaterally,clear to  auscultation bilaterally  8. Cardiovascular : RRR, no gallops, rubs or murmurs, no leg edema  9. Gastrointestinal:  Positive bowel sounds, abdomen soft, non-tender to palpation,no hepatosplenomegaly, no rigidity or guarding       10. Skin:  No cyanosis, normal texture and turgor, no rash, lesions or ulcers     Data Review:    CBC Recent Labs  Lab 08/01/17 2027  WBC 19.1*  HGB 16.6*  HCT 47.4*  PLT 332  MCV 92.6  MCH 32.4  MCHC 35.0  RDW 13.1  LYMPHSABS 9.4*  MONOABS 1.1*  EOSABS 0.2  BASOSABS 0.1    ------------------------------------------------------------------------------------------------------------------  Chemistries  Recent Labs  Lab 08/01/17 2027  NA 141  K 3.8  CL 104  CO2 17*  GLUCOSE 173*  BUN 19  CREATININE 0.78  CALCIUM 9.8  MG 1.8  AST 35  ALT <5  ALKPHOS 62  BILITOT 1.2   ------------------------------------------------------------------------------------------------------------------  ------------------------------------------------------------------------------------------------------------------ GFR: CrCl cannot be calculated (Unknown ideal weight.). Liver Function Tests: Recent Labs  Lab 08/01/17 2027  AST 35  ALT <5  ALKPHOS 62  BILITOT 1.2  PROT 8.1  ALBUMIN 4.5   No results for input(s): LIPASE, AMYLASE in the last 168 hours. No results for input(s): AMMONIA in the last 168 hours. Coagulation Profile: No results for input(s): INR, PROTIME in the last 168 hours. Cardiac Enzymes: Recent Labs  Lab 08/01/17 2027  TROPONINI <0.03   BNP (last 3 results) No results for input(s): PROBNP in the last 8760 hours. HbA1C: No results for input(s): HGBA1C in the last 72 hours. CBG: No results for input(s): GLUCAP in the last 168 hours. Lipid Profile: No results for input(s): CHOL, HDL, LDLCALC, TRIG, CHOLHDL, LDLDIRECT in the last 72 hours. Thyroid Function Tests: No results for input(s): TSH, T4TOTAL, FREET4, T3FREE, THYROIDAB in the last 72 hours. Anemia Panel: No results for input(s): VITAMINB12, FOLATE, FERRITIN, TIBC, IRON, RETICCTPCT in the last 72 hours.  ----------------------------------------------------------------------   Imaging Results:    Ct Head Wo Contrast  Result Date: 08/01/2017 CLINICAL DATA:  Altered level of consciousness. New onset seizure. Recent CVA. EXAM: CT HEAD WITHOUT CONTRAST TECHNIQUE: Contiguous axial images were obtained from the base of the skull through the vertex without intravenous contrast.  COMPARISON:  04/16/2017 head CT.  06/16/2017 prior MRI. FINDINGS: Brain: Continued evolution of encephalomalacia in left basal ganglia and left parieto-occipital lobe. No evidence of parenchymal hemorrhage or extra-axial fluid collection. No mass lesion, mass effect, or midline shift. No CT evidence of acute infarction. Nonspecific moderate subcortical and periventricular white matter hypodensity, most in keeping with chronic small vessel ischemic change. Cerebral volume is age appropriate. Ex vacuo dilatation of the occipital and frontal horns of the left lateral ventricle. No acute ventriculomegaly. Vascular: No acute abnormality. Skull: No evidence of calvarial fracture. Sinuses/Orbits: No fluid levels. Mild mucoperiosteal thickening and/or mucous retention cysts versus polyps in the dependent left maxillary sinus. Other:  The mastoid air cells are unopacified. IMPRESSION: 1.  No evidence of acute intracranial abnormality. 2. Continued evolution of encephalomalacia in the left basal ganglia and left parieto-occipital lobe. 3. Moderate chronic small vessel ischemic changes in the cerebral white matter. Electronically Signed   By: Ilona Sorrel M.D.   On: 08/01/2017 21:14    My personal review of EKG: Rhythm NSR   Assessment & Plan:    Active Problems:   Altered mental status   1. Altered mental status-likely postictal from seizure, also obtain UA.  Patient given 1 g of Keppra in the ED.  Will continue with Keppra 1 g p.o. twice daily from tomorrow morning.  Consult neurology in a.m.   2. Seizure disorder-continue Keppra as above 3. History of MCA, PCA infarct with right hemiplegia-continue aspirin, Plavix, Lipitor 4. Hypertension-continue Norvasc, will start hydralazine 5 mg IV every 6 hours as needed 5. Diabetes mellitus-hold metformin, start sliding scale insulin.  Check CBG every 6 hours. 6. Depression-continue Prozac, amantadine   DVT Prophylaxis-   SCDs   AM Labs Ordered, also please  review Full Orders  Family Communication: Admission, patients condition and plan of care including tests being ordered have been discussed with the patients husband at bedside who indicate understanding and agree with the plan and Code Status.  Code Status: Partial code, no CPR  Admission status: Inpatient  Time spent in minutes : 60 minutes   Oswald Hillock M.D on 08/01/2017 at 10:27 PM  Between 7am to 7pm - Pager - [phone]. After 7pm go to [email or web address] - password [facility]  [facility] - Office  [phone]

## 2017-08-02 ENCOUNTER — Encounter (HOSPITAL_COMMUNITY): Payer: Self-pay | Admitting: Primary Care

## 2017-08-02 ENCOUNTER — Telehealth: Payer: Self-pay | Admitting: *Deleted

## 2017-08-02 DIAGNOSIS — Z7189 Other specified counseling: Secondary | ICD-10-CM

## 2017-08-02 DIAGNOSIS — Z515 Encounter for palliative care: Secondary | ICD-10-CM

## 2017-08-02 DIAGNOSIS — I69359 Hemiplegia and hemiparesis following cerebral infarction affecting unspecified side: Secondary | ICD-10-CM

## 2017-08-02 DIAGNOSIS — E1159 Type 2 diabetes mellitus with other circulatory complications: Secondary | ICD-10-CM

## 2017-08-02 DIAGNOSIS — R569 Unspecified convulsions: Secondary | ICD-10-CM

## 2017-08-02 DIAGNOSIS — E785 Hyperlipidemia, unspecified: Secondary | ICD-10-CM

## 2017-08-02 DIAGNOSIS — I10 Essential (primary) hypertension: Secondary | ICD-10-CM

## 2017-08-02 DIAGNOSIS — R4182 Altered mental status, unspecified: Secondary | ICD-10-CM

## 2017-08-02 LAB — GLUCOSE, CAPILLARY
GLUCOSE-CAPILLARY: 100 mg/dL — AB (ref 70–99)
GLUCOSE-CAPILLARY: 112 mg/dL — AB (ref 70–99)
GLUCOSE-CAPILLARY: 113 mg/dL — AB (ref 70–99)
GLUCOSE-CAPILLARY: 134 mg/dL — AB (ref 70–99)
Glucose-Capillary: 119 mg/dL — ABNORMAL HIGH (ref 70–99)
Glucose-Capillary: 100 mg/dL — ABNORMAL HIGH (ref 70–99)

## 2017-08-02 LAB — CBC
HCT: 43.7 % (ref 36.0–46.0)
HEMOGLOBIN: 15.5 g/dL — AB (ref 12.0–15.0)
MCH: 32.4 pg (ref 26.0–34.0)
MCHC: 35.5 g/dL (ref 30.0–36.0)
MCV: 91.4 fL (ref 78.0–100.0)
Platelets: 253 10*3/uL (ref 150–400)
RBC: 4.78 MIL/uL (ref 3.87–5.11)
RDW: 13 % (ref 11.5–15.5)
WBC: 11.5 10*3/uL — AB (ref 4.0–10.5)

## 2017-08-02 LAB — HEMOGLOBIN A1C
Hgb A1c MFr Bld: 5.1 % (ref 4.8–5.6)
MEAN PLASMA GLUCOSE: 99.67 mg/dL

## 2017-08-02 LAB — COMPREHENSIVE METABOLIC PANEL
ALBUMIN: 4.4 g/dL (ref 3.5–5.0)
ALK PHOS: 53 U/L (ref 38–126)
ALT: 19 U/L (ref 0–44)
AST: 20 U/L (ref 15–41)
Anion gap: 13 (ref 5–15)
BUN: 14 mg/dL (ref 8–23)
CHLORIDE: 106 mmol/L (ref 98–111)
CO2: 21 mmol/L — ABNORMAL LOW (ref 22–32)
Calcium: 8.9 mg/dL (ref 8.9–10.3)
Creatinine, Ser: 0.42 mg/dL — ABNORMAL LOW (ref 0.44–1.00)
GFR calc non Af Amer: >60 mL/min (ref >60)
Glucose, Bld: 138 mg/dL — ABNORMAL HIGH (ref 70–99)
Potassium: 3 mmol/L — ABNORMAL LOW (ref 3.5–5.1)
SODIUM: 140 mmol/L (ref 135–145)
Total Bilirubin: 1.2 mg/dL (ref 0.3–1.2)
Total Protein: 7.7 g/dL (ref 6.5–8.1)

## 2017-08-02 LAB — MRSA PCR SCREENING: MRSA BY PCR: NEGATIVE

## 2017-08-02 MED ORDER — SODIUM CHLORIDE 0.9 % IV SOLN: INTRAVENOUS | Status: DC

## 2017-08-02 MED ORDER — MORPHINE SULFATE (PF) 2 MG/ML IV SOLN
0.5000 mg | INTRAVENOUS | Status: DC | PRN
  Administered 2017-08-02 – 2017-08-05 (×7): 0.5 mg via INTRAVENOUS
  Filled 2017-08-02 (×7): qty 1

## 2017-08-02 MED ORDER — POTASSIUM CHLORIDE IN NACL 40-0.9 MEQ/L-% IV SOLN
INTRAVENOUS | Status: DC
  Administered 2017-08-02 – 2017-08-03 (×2): 75 mL/h via INTRAVENOUS
  Administered 2017-08-03 – 2017-08-06 (×8): 100 mL/h via INTRAVENOUS
  Filled 2017-08-02: qty 2000

## 2017-08-02 MED ORDER — POTASSIUM CHLORIDE CRYS ER 20 MEQ PO TBCR: 40.0000 meq | EXTENDED_RELEASE_TABLET | ORAL | Status: DC

## 2017-08-02 MED ORDER — MORPHINE SULFATE (PF) 2 MG/ML IV SOLN
0.5000 mg | INTRAVENOUS | Status: DC | PRN
  Administered 2017-08-02: 0.5 mg via INTRAVENOUS
  Filled 2017-08-02: qty 1

## 2017-08-02 MED ORDER — LEVETIRACETAM IN NACL 1500 MG/100ML IV SOLN
1500.0000 mg | Freq: Two times a day (BID) | INTRAVENOUS | Status: DC
  Administered 2017-08-03 – 2017-08-06 (×7): 1500 mg via INTRAVENOUS
  Filled 2017-08-02 (×7): qty 100

## 2017-08-02 MED ORDER — LEVETIRACETAM IN NACL 1000 MG/100ML IV SOLN
1000.0000 mg | Freq: Two times a day (BID) | INTRAVENOUS | Status: DC
  Administered 2017-08-02: 1000 mg via INTRAVENOUS
  Filled 2017-08-02: qty 100

## 2017-08-02 NOTE — Progress Notes (Signed)
SLP Cancellation Note  Patient Details Name: Ebony Williams MRN: [id number] DOB: [birth year]   Cancelled treatment:       Reason Eval/Treat Not Completed: Patient's level of consciousness;Patient not medically ready; Pt currently unresponsive and inappropriate for BSE. Will check back tomorrow.  Thank you,  Genene Churn, [place]    [place] 08/02/2017, 5:14 PM

## 2017-08-02 NOTE — Consult Note (Signed)
[place] A. Merlene Laughter, MD     [email or web address]          Ebony Williams is an 67 y.o. female.   ASSESSMENT/PLAN: 1.  Altered mental status most likely due to multiple seizures.  The patient's Keppra will be increased.  An EEG will be obtained.  Will follow patient's progress. 2.  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 3.  Marked neurological deficits from recent left MCA infarct with the patient having visual aphasia and right hemiplegia.    The patient is a 67 year old white female who presented with the left MCA syndrome in March of this year.  She did undergo IV thrombolysis.  Fortunately, she still had residual significant deficits with aphasia and left-sided hemiparesis.  She has been maintained on dual antiplatelet agents of aspirin and Plavix.  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  She has been given Keppra.  She presented today with altered mental status.  She had a grand mal witnessed seizure on in route to the hospital.  She has been unresponsive although recently she has had improvement in cognition.  Review of systems is not obtainable due to the severe cognitive impairment.     GENERAL: The patient lays in bed and appears to be in significant discomfort but no acute distress.  HEENT: Supple and without evidence of trauma  ABDOMEN: soft  EXTREMITIES: No edema   BACK: Normal  SKIN: Normal by inspection.    MENTAL STATUS: She is awake and responsive.  She does focuses and tracks but does not follow commands.  She is mute.  She grimaces to painful stimuli.  CRANIAL NERVES: Pupils are equal, round and reactive to light; extra ocular movements are full by oculocephalic reflexes; visual fields shows reduced response on the right visual field consistent with a homonymous hemianopia; upper and lower facial  muscles are normal in strength and symmetric, there is no flattening of the nasolabial folds;  MOTOR: She moves the left side vigorously and well.  Bulk and tone are normal.  The right side shows a marked spasticity especially involving the right upper extremity.  The right upper extremity is graded as 1-2/5.  Right leg 3/5 with the patient withdrawing to painful stimuli purposefully.  COORDINATION: She seems to have episodic volitional shaking on the left side.  No clear tonic-clonic movements.  No tremors.  No parkinsonism.  No myoclonus.  No dysmetria.  REFLEXES: Deep tendon reflexes are symmetrical and normal. .   SENSATION: She responds to painful stimuli on both sides about equally.     Blood pressure (!) 142/117, pulse 91, temperature 98.6 F (37 C), temperature source Oral, resp. rate 18, height '5\' 5"'$  (1.651 m), weight 143 lb 8.3 oz (65.1 kg), SpO2 99 %.  Past Medical History:  Diagnosis Date  . History of kidney stones    x1  . Hypertension     Past Surgical History:  Procedure Laterality Date  . CHOLECYSTECTOMY     laparoscopic  . COLONOSCOPY WITH PROPOFOL N/A 01/12/2015   Procedure: COLONOSCOPY WITH PROPOFOL;  Surgeon: Garlan Fair, MD;  Location: [facility];  Service: Endoscopy;  Laterality: N/A;  . TUBAL LIGATION      Family History  Problem Relation Age of Onset  . Diabetes Mother   . Hypertension Mother     Social History:  reports that she has  never smoked. She has never used smokeless tobacco. She reports that she drinks alcohol. She reports that she does not use drugs.  Allergies:  Allergies  Allergen Reactions  . Sulfa Antibiotics Diarrhea and Itching    Medications: Prior to Admission medications   Medication Sig Start Date End Date Taking? Authorizing Provider  acetaminophen (TYLENOL) 325 MG tablet Take 325-650 mg by mouth daily as needed for mild pain or moderate pain.    Yes [provider]  ALPRAZolam (XANAX) 0.25 MG tablet Take  0.25 mg by mouth at bedtime as needed for sleep.  07/26/17  Yes [provider]  amantadine (SYMMETREL) 100 MG capsule Take 2 capsules (200 mg total) by mouth 2 (two) times daily. 05/08/17  Yes Angiulli, Lavon Paganini, PA-C  amLODipine (NORVASC) 5 MG tablet Take 5 mg by mouth daily.   Yes [provider]  aspirin 81 MG chewable tablet Chew 1 tablet (81 mg total) by mouth daily. 05/08/17  Yes Angiulli, Lavon Paganini, PA-C  atorvastatin (LIPITOR) 40 MG tablet Take 1 tablet (40 mg total) by mouth daily. 06/22/17  Yes Patrecia Pour, MD  clopidogrel (PLAVIX) 75 MG tablet Take 1 tablet (75 mg total) by mouth daily. 05/08/17  Yes Angiulli, Lavon Paganini, PA-C  FLUoxetine (PROZAC) 40 MG capsule Take 1 capsule (40 mg total) by mouth at bedtime. 06/22/17  Yes Patrecia Pour, MD  levETIRAcetam (KEPPRA) 1000 MG tablet Take 1 tablet (1,000 mg total) by mouth 2 (two) times daily. 06/22/17  Yes Patrecia Pour, MD  metFORMIN (GLUCOPHAGE) 500 MG tablet Take 1 tablet (500 mg total) by mouth daily with breakfast. 05/08/17  Yes Angiulli, Lavon Paganini, PA-C  nitrofurantoin, macrocrystal-monohydrate, (MACROBID) 100 MG capsule Take 100 mg by mouth 2 (two) times daily. 07/30/17  Yes [provider]  nystatin (NYSTATIN) powder Apply topically See admin instructions. Apply topically to groin and outer vaginal area twice daily prn for rash/irritation   Yes [provider]  ondansetron (ZOFRAN) 4 MG tablet Take 4 mg by mouth every 8 (eight) hours as needed for nausea or vomiting.   Yes [provider]  pantoprazole (PROTONIX) 40 MG tablet Take 1 tablet (40 mg total) by mouth daily. 05/07/17  Yes Angiulli, Lavon Paganini, PA-C  polyethylene glycol (MIRALAX / GLYCOLAX) packet Take 17 g by mouth daily. Mix with 8-10 oz water and drink   Yes [provider]  traMADol (ULTRAM) 50 MG tablet Take 0.5 tablets (25 mg total) by mouth every 6 (six) hours as needed. 07/11/17  Yes Lassen, Arlo C, PA-C    Scheduled Meds: .  amantadine  200 mg Oral BID  . amLODipine  5 mg Oral Daily  . aspirin  81 mg Oral Daily  . atorvastatin  40 mg Oral Daily  . clopidogrel  75 mg Oral Daily  . FLUoxetine  40 mg Oral QHS  . insulin aspart  0-9 Units Subcutaneous TID WC  . pantoprazole  40 mg Oral Daily   Continuous Infusions: . 0.9 % NaCl with KCl 40 mEq / L 75 mL/hr (08/02/17 1431)  . levETIRAcetam Stopped (08/02/17 1446)   PRN Meds:.hydrALAZINE, morphine injection, ondansetron **OR** ondansetron (ZOFRAN) IV     Results for orders placed or performed during the hospital encounter of 08/01/17 (from the past 48 hour(s))  CBC with Differential     Status: Abnormal   Collection Time: 08/01/17  8:27 PM  Result Value Ref Range   WBC 19.1 (H) 4.0 - 10.5 K/uL  Comment: WHITE COUNT CONFIRMED ON SMEAR ATYPICAL LYMPHOCYTES    RBC 5.12 (H) 3.87 - 5.11 MIL/uL   Hemoglobin 16.6 (H) 12.0 - 15.0 g/dL   HCT 47.4 (H) 36.0 - 46.0 %   MCV 92.6 78.0 - 100.0 fL   MCH 32.4 26.0 - 34.0 pg   MCHC 35.0 30.0 - 36.0 g/dL   RDW 13.1 11.5 - 15.5 %   Platelets 332 150 - 400 K/uL   Neutrophils Relative % 44 %   Neutro Abs 8.4 (H) 1.7 - 7.7 K/uL   Lymphocytes Relative 49 %   Lymphs Abs 9.4 (H) 0.7 - 4.0 K/uL   Monocytes Relative 6 %   Monocytes Absolute 1.1 (H) 0.1 - 1.0 K/uL   Eosinophils Relative 1 %   Eosinophils Absolute 0.2 0.0 - 0.7 K/uL   Basophils Relative 0 %   Basophils Absolute 0.1 0.0 - 0.1 K/uL    Comment: Performed at [facility], [address] [place], [place] [address]  Comprehensive metabolic panel     Status: Abnormal   Collection Time: 08/01/17  8:27 PM  Result Value Ref Range   Sodium 141 135 - 145 mmol/L   Potassium 3.8 3.5 - 5.1 mmol/L   Chloride 104 98 - 111 mmol/L    Comment: Please note change in reference range.   CO2 17 (L) 22 - 32 mmol/L   Glucose, Bld 173 (H) 70 - 99 mg/dL    Comment: Please note change in reference range.   BUN 19 8 - 23 mg/dL    Comment: Please note change in reference range.     Creatinine, Ser 0.78 0.44 - 1.00 mg/dL   Calcium 9.8 8.9 - 10.3 mg/dL   Total Protein 8.1 6.5 - 8.1 g/dL   Albumin 4.5 3.5 - 5.0 g/dL   AST 35 15 - 41 U/L   ALT <5 0 - 44 U/L   Alkaline Phosphatase 62 38 - 126 U/L   Total Bilirubin 1.2 0.3 - 1.2 mg/dL   GFR calc non Af Amer >60 >60 mL/min   GFR calc Af Amer >60 >60 mL/min    Comment: (NOTE) The eGFR has been calculated using the CKD EPI equation. This calculation has not been validated in all clinical situations. eGFR's persistently <60 mL/min signify possible Chronic Kidney Disease.    Anion gap 20 (H) 5 - 15    Comment: Performed at [facility], [address], [place], [place] [address]  Magnesium     Status: None   Collection Time: 08/01/17  8:27 PM  Result Value Ref Range   Magnesium 1.8 1.7 - 2.4 mg/dL    Comment: Performed at [facility], [address], [place], [place] [address]  Troponin I     Status: None   Collection Time: 08/01/17  8:27 PM  Result Value Ref Range   Troponin I <0.03 <0.03 ng/mL    Comment: Performed at [facility], [address] [place], [place] [address]  Hemoglobin A1c     Status: None   Collection Time: 08/01/17  8:27 PM  Result Value Ref Range   Hgb A1c MFr Bld 5.1 4.8 - 5.6 %    Comment: (NOTE) Pre diabetes:          5.7%-6.4% Diabetes:              >6.4% Glycemic control for   <7.0% adults with diabetes    Mean Plasma Glucose 99.67 mg/dL    Comment: Performed at [facility], [place] [address] [place], [place] [address]  I-Stat CG4 Lactic Acid, ED     Status: Abnormal   Collection Time: 08/01/17  8:40 PM  Result Value Ref Range   Lactic Acid, Venous 10.72 (HH) 0.5 - 1.9 mmol/L  Urinalysis, Routine w reflex microscopic     Status: Abnormal   Collection Time: 08/01/17  9:58 PM  Result Value Ref Range   Color, Urine YELLOW YELLOW   APPearance CLEAR CLEAR   Specific Gravity, Urine 1.018 1.005 - 1.030   pH 5.0 5.0 - 8.0   Glucose, UA 50 (A) NEGATIVE mg/dL   Hgb urine dipstick  NEGATIVE NEGATIVE   Bilirubin Urine NEGATIVE NEGATIVE   Ketones, ur 80 (A) NEGATIVE mg/dL   Protein, ur 100 (A) NEGATIVE mg/dL   Nitrite NEGATIVE NEGATIVE   Leukocytes, UA NEGATIVE NEGATIVE   RBC / HPF 0-5 0 - 5 RBC/hpf   WBC, UA 0-5 0 - 5 WBC/hpf   Bacteria, UA RARE (A) NONE SEEN   Squamous Epithelial / LPF 0-5 0 - 5    Comment: Performed at [facility], [address] [place], [place] [address]  I-Stat CG4 Lactic Acid, ED     Status: None   Collection Time: 08/01/17 10:51 PM  Result Value Ref Range   Lactic Acid, Venous 1.61 0.5 - 1.9 mmol/L  MRSA PCR Screening     Status: None   Collection Time: 08/01/17 11:33 PM  Result Value Ref Range   MRSA by PCR NEGATIVE NEGATIVE    Comment:        The GeneXpert MRSA Assay (FDA approved for NASAL specimens only), is one component of a comprehensive MRSA colonization surveillance program. It is not intended to diagnose MRSA infection nor to guide or monitor treatment for MRSA infections. Performed at [facility], [address] [place], [place] [address]   Glucose, capillary     Status: Abnormal   Collection Time: 08/02/17 12:10 AM  Result Value Ref Range   Glucose-Capillary 134 (H) 70 - 99 mg/dL   Comment 1 Notify RN    Comment 2 Document in Chart   CBC     Status: Abnormal   Collection Time: 08/02/17  5:27 AM  Result Value Ref Range   WBC 11.5 (H) 4.0 - 10.5 K/uL   RBC 4.78 3.87 - 5.11 MIL/uL   Hemoglobin 15.5 (H) 12.0 - 15.0 g/dL   HCT 43.7 36.0 - 46.0 %   MCV 91.4 78.0 - 100.0 fL   MCH 32.4 26.0 - 34.0 pg   MCHC 35.5 30.0 - 36.0 g/dL   RDW 13.0 11.5 - 15.5 %   Platelets 253 150 - 400 K/uL    Comment: Performed at [facility], [address] [place], [place] [address]  Comprehensive metabolic panel     Status: Abnormal   Collection Time: 08/02/17  5:27 AM  Result Value Ref Range   Sodium 140 135 - 145 mmol/L   Potassium 3.0 (L) 3.5 - 5.1 mmol/L    Comment: DELTA CHECK NOTED   Chloride 106 98 - 111 mmol/L     Comment: Please note change in reference range.   CO2 21 (L) 22 - 32 mmol/L   Glucose, Bld 138 (H) 70 - 99 mg/dL    Comment: Please note change in reference range.   BUN 14 8 - 23 mg/dL    Comment: Please note change in reference range.   Creatinine, Ser 0.42 (L) 0.44 - 1.00 mg/dL  Calcium 8.9 8.9 - 10.3 mg/dL   Total Protein 7.7 6.5 - 8.1 g/dL   Albumin 4.4 3.5 - 5.0 g/dL   AST 20 15 - 41 U/L   ALT 19 0 - 44 U/L    Comment: Please note change in reference range.   Alkaline Phosphatase 53 38 - 126 U/L   Total Bilirubin 1.2 0.3 - 1.2 mg/dL   GFR calc non Af Amer >60 >60 mL/min   GFR calc Af Amer >60 >60 mL/min    Comment: (NOTE) The eGFR has been calculated using the CKD EPI equation. This calculation has not been validated in all clinical situations. eGFR's persistently <60 mL/min signify possible Chronic Kidney Disease.    Anion gap 13 5 - 15    Comment: Performed at [facility], [address] [place], [place] [address]  Glucose, capillary     Status: Abnormal   Collection Time: 08/02/17  6:10 AM  Result Value Ref Range   Glucose-Capillary 119 (H) 70 - 99 mg/dL   Comment 1 Notify RN    Comment 2 Document in Chart   Glucose, capillary     Status: Abnormal   Collection Time: 08/02/17  8:15 AM  Result Value Ref Range   Glucose-Capillary 112 (H) 70 - 99 mg/dL  Glucose, capillary     Status: Abnormal   Collection Time: 08/02/17 11:53 AM  Result Value Ref Range   Glucose-Capillary 100 (H) 70 - 99 mg/dL  Glucose, capillary     Status: Abnormal   Collection Time: 08/02/17  4:05 PM  Result Value Ref Range   Glucose-Capillary 100 (H) 70 - 99 mg/dL    Studies/Results:   EEG 06-2017 This is an abnormal EEG due to the presence of the following: 1) Background delta and theta slowing, suggesting moderate to severe encephalopathy, which is nonspecific as to etiology; 2) Prominent focal polymorphic delta slowing in the left hemisphere, suggesting left hemispheric dysfunction and/or  structural lesion, consistent with patient's history of stroke; 3) Frequent epileptiform discharges in the left posterior temporoparietal region, which waxed and waned and oftentimes appeared semi-periodic, concerning for intermittent focal electrographic seizures.     HEAD CT FINDINGS: Brain: Continued evolution of encephalomalacia in left basal ganglia and left parieto-occipital lobe. No evidence of parenchymal hemorrhage or extra-axial fluid collection. No mass lesion, mass effect, or midline shift. No CT evidence of acute infarction. Nonspecific moderate subcortical and periventricular white matter hypodensity, most in keeping with chronic small vessel ischemic change. Cerebral volume is age appropriate. Ex vacuo dilatation of the occipital and frontal horns of the left lateral ventricle. No acute ventriculomegaly.  Vascular: No acute abnormality.  Skull: No evidence of calvarial fracture.  Sinuses/Orbits: No fluid levels. Mild mucoperiosteal thickening and/or mucous retention cysts versus polyps in the dependent left maxillary sinus.  Other:  The mastoid air cells are unopacified.  IMPRESSION: 1.  No evidence of acute intracranial abnormality. 2. Continued evolution of encephalomalacia in the left basal ganglia and left parieto-occipital lobe. 3. Moderate chronic small vessel ischemic changes in the cerebral white matter.     BRAIN MRI 06-2017 IMPRESSION: 1. Evolving ischemia with petechial hemorrhage, laminar necrosis, and developing encephalomalacia in the left MCA and PCA parenchyma affected on 04/07/2017. Continued diffusion abnormality in the left basal ganglia and portions of the left occipital lobe. Post ischemic enhancement.  2. Underlying advanced chronic small vessel disease suspected. Pronounced chronic appearing signal changes in the medial thalami and pons.  3. No new intracranial ischemia or  new intracranial  abnormality identified.     HEAD NECK CTA 04-2017 IMPRESSION: 1. Unchanged CTA examination without emergent large vessel occlusion. No intracranial hemorrhage. 2. Moderate proximal left MCA M1 segment stenosis with mild elevation of Tmax in the left MCA/PCA watershed area. This is also unchanged and likely due to the proximal M1 stenosis. 3. No acute infarct by CBF criteria. The previously seen 4 mL fixed perfusion defect in the left posterior watershed territory is no longer present. This may be due to residual intravascular enhancement from the earlier study. 4. Unchanged left basal ganglia perfusion abnormality, which remains indeterminate and is not demonstrated on the final Tmax or CBF maps.    HEAD NECK CTA 06-2017 IMPRESSION: No intracranial large or medium vessel occlusion identified.  Atherosclerotic tortuosity of the aorta. 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  Minimal atherosclerotic change at the left carotid bifurcation but no stenosis.    Kierre Hintz A. Merlene Laughter, M.D.  Diplomate, Tax adviser of Psychiatry and Neurology ( Neurology). 08/02/2017, 5:40 PM

## 2017-08-02 NOTE — ED Notes (Signed)
Date and time results received: 08/02/17   08/01/17 Dr. Wilson Singer notified at 21:00 (use smartphrase ".now" to insert current time)  Test: I stat Lactic Critical Value: 10.75  Name of Provider Notified: Wilson Singer  Orders Received? Or Actions Taken?:

## 2017-08-02 NOTE — Telephone Encounter (Signed)
I am sorry to hear that.  I think it would be prudent to trust the physicians at the hospital.  They are aware of Ebony Williams status and capabilities of their facility.  If it is necessary to transfer the patient, they will contact the critical care team at [facility]. Please let me know if there is anything that I can do.  All the best.  Thanks.

## 2017-08-02 NOTE — Progress Notes (Signed)
PROGRESS NOTE    Ebony Williams  JGG:[id number] DOB: Sep 21, 1950 DOA: 08/01/2017 PCP: Sharilyn Sites, MD    Brief Narrative:   67 y.o. 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  As per note from the EMS patient appeared to be confused and was not attempting to respond to questions verbally but her speech was garbled.  Shortly after their arrival she began having generalized tonic-clonic activity and was given 5 mg of Versed.  Admitted for further evaluation and management. Neurology consult pending. Actively declining; and no progressing at home.    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  -plan is to resume ASA, plavix and lipitor when tolerating PO -patient with active declining and poor qualoity -palliative care consulted   3-HTN -continue PRN hydralazine -follow VS -BP under fair control   4-difficulty taking PO's -due to current mentation  -will have SPL on board to evaluate and further assist with recommendations on diet and meds texture  5-recent UTI -no signs of active infection currently -follow culture -was just taking macrobid prior to admission (according to family completed 3-4 days)  6-depression/anxiety  -will continue prozac and amantadine when able to take PO's  7-type 2 diabetes: with vascular process -continue holding oral hypoglycemic regimen  -continue SSI -follow CBG's trend   8-hypokalemia -repleted through her IV -follow trend -Mg WNL   DVT prophylaxis: SCD's Code  Status: partial DNR Family Communication: daughter by phone  Disposition Plan: remains inpatient, keppra changed to IV; PRN analgesics ordered; follow clinical response; no active seizure appreciated; EEG pending.  Consultants:   Neurology   Procedures:   See below for x-ray reports   EEG: ordered and pending   Antimicrobials:  Anti-infectives (From admission, onward)   None       Subjective:   Objective: Vitals:   08/02/17 0800 08/02/17 0900 08/02/17 1000 08/02/17 1154  BP: (!) 156/71 (!) 150/118 (!) 151/81   Pulse: (!) 103 (!) 107 100 96  Resp: (!) 21 (!) 27 (!) 27   Temp:    98 F (36.7 C)  TempSrc:    Axillary  SpO2: 99% 98% 98% 99%  Weight:      Height:        Intake/Output Summary (Last 24 hours) at 08/02/2017 1234 Last data filed at 08/02/2017 0400 Gross per 24 hour  Intake 815 ml  Output -  Net 815 ml   Filed Weights   08/01/17 2349 08/02/17 0400  Weight: 63.6 kg (140 lb 3.4 oz) 65.1 kg (143 lb 8.3 oz)    Examination: General exam: Alert, awake, oriented x 1; no following commands and currently no answering questions. (per records patient with underlying residual aphasia after prior stroke; unknown baseline). Respiratory system: Clear to auscultation. Respiratory effort normal. Cardiovascular system: Rate controlled. No murmurs, rubs, gallops. Gastrointestinal system: Abdomen is nondistended, soft and nontender. No organomegaly or masses felt. Normal bowel sounds heard. Central nervous system: Alert and oriented. No focal neurological deficits. Extremities: No C/C/E, +pedal pulses Skin: No rashes, lesions or ulcers Psychiatry: Judgement and insight appear normal. Mood &  affect appropriate.    Data Reviewed: I have personally reviewed following labs and imaging studies  CBC: Recent Labs  Lab 08/01/17 2027 08/02/17 0527  WBC 19.1* 11.5*  NEUTROABS 8.4*  --   HGB 16.6* 15.5*  HCT 47.4* 43.7  MCV 92.6 91.4  PLT 332 149   Basic Metabolic  Panel: Recent Labs  Lab 08/01/17 2027 08/02/17 0527  NA 141 140  K 3.8 3.0*  CL 104 106  CO2 17* 21*  GLUCOSE 173* 138*  BUN 19 14  CREATININE 0.78 0.42*  CALCIUM 9.8 8.9  MG 1.8  --    GFR: Estimated Creatinine Clearance: 59.7 mL/min (A) (by C-G formula based on SCr of 0.42 mg/dL (L)).   Liver Function Tests: Recent Labs  Lab 08/01/17 2027 08/02/17 0527  AST 35 20  ALT <5 19  ALKPHOS 62 53  BILITOT 1.2 1.2  PROT 8.1 7.7  ALBUMIN 4.5 4.4   Cardiac Enzymes: Recent Labs  Lab 08/01/17 2027  TROPONINI <0.03   CBG: Recent Labs  Lab 08/02/17 0010 08/02/17 0610 08/02/17 0815 08/02/17 1153  GLUCAP 134* 119* 112* 100*   Urine analysis:    Component Value Date/Time   COLORURINE YELLOW 08/01/2017 2158   APPEARANCEUR CLEAR 08/01/2017 2158   LABSPEC 1.018 08/01/2017 2158   PHURINE 5.0 08/01/2017 2158   GLUCOSEU 50 (A) 08/01/2017 2158   HGBUR NEGATIVE 08/01/2017 2158   BILIRUBINUR NEGATIVE 08/01/2017 2158   KETONESUR 80 (A) 08/01/2017 2158   PROTEINUR 100 (A) 08/01/2017 2158   NITRITE NEGATIVE 08/01/2017 2158   LEUKOCYTESUR NEGATIVE 08/01/2017 2158    Recent Results (from the past 240 hour(s))  MRSA PCR Screening     Status: None   Collection Time: 08/01/17 11:33 PM  Result Value Ref Range Status   MRSA by PCR NEGATIVE NEGATIVE Final    Comment:        The GeneXpert MRSA Assay (FDA approved for NASAL specimens only), is one component of a comprehensive MRSA colonization surveillance program. It is not intended to diagnose MRSA infection nor to guide or monitor treatment for MRSA infections. Performed at [facility], [address], [place], [place] [address]      Radiology Studies: Ct Head Wo Contrast  Result Date: 08/01/2017 CLINICAL DATA:  Altered level of consciousness. New onset seizure. Recent CVA. EXAM: CT HEAD WITHOUT CONTRAST TECHNIQUE: Contiguous axial images were obtained from the base of the skull through the vertex without intravenous  contrast. COMPARISON:  04/16/2017 head CT.  06/16/2017 prior MRI. FINDINGS: Brain: Continued evolution of encephalomalacia in left basal ganglia and left parieto-occipital lobe. No evidence of parenchymal hemorrhage or extra-axial fluid collection. No mass lesion, mass effect, or midline shift. No CT evidence of acute infarction. Nonspecific moderate subcortical and periventricular white matter hypodensity, most in keeping with chronic small vessel ischemic change. Cerebral volume is age appropriate. Ex vacuo dilatation of the occipital and frontal horns of the left lateral ventricle. No acute ventriculomegaly. Vascular: No acute abnormality. Skull: No evidence of calvarial fracture. Sinuses/Orbits: No fluid levels. Mild mucoperiosteal thickening and/or mucous retention cysts versus polyps in the dependent left maxillary sinus. Other:  The mastoid air cells are unopacified. IMPRESSION: 1.  No evidence of acute intracranial abnormality. 2. Continued evolution of encephalomalacia in the left basal ganglia and left parieto-occipital lobe. 3. Moderate chronic small vessel ischemic changes in the cerebral white matter. Electronically Signed   By: Ilona Sorrel M.D.   On: 08/01/2017 21:14    Scheduled Meds: .  amantadine  200 mg Oral BID  . amLODipine  5 mg Oral Daily  . aspirin  81 mg Oral Daily  . atorvastatin  40 mg Oral Daily  . clopidogrel  75 mg Oral Daily  . FLUoxetine  40 mg Oral QHS  . insulin aspart  0-9 Units Subcutaneous TID WC  . levETIRAcetam  1,000 mg Oral BID  . pantoprazole  40 mg Oral Daily   Continuous Infusions: . sodium chloride 0.9 % 1,000 mL with potassium chloride 40 mEq infusion       LOS: 1 day    Time spent: 30 minutes    Barton Dubois, MD [facility] Pager [phone]  If 7PM-7AM, please contact night-coverage [email or web address] Password [facility] 08/02/2017, 12:34 PM

## 2017-08-02 NOTE — Progress Notes (Addendum)
Pts daughter Ebony Williams called this Rn to see if there was any way to prevent her brother (pts son) from coming to visit his mother in the hospital. Ebony Williams stated she heard the son came up and made a scene and was yelling at his mom to "snap out of it." This RN asked if Ebony Williams was a POA and she stated "No, my step dad Ebony Williams is the [place]. I am on her contact list."  Ebony Williams (pts Husband)- POA called and stated, " I don't want that piece of crap in the room with my wife. He made a scene today and I don't want him anywhere around her."  This RN called [facility] Tiffany to see how to handle situation. Tiffany stated unless the pt was made HIPAA (XXX) or pts husband took out a 50-B on son we can't force him to leave unless he is being disruptive. Stated she would talk to security and get back to me.

## 2017-08-02 NOTE — Progress Notes (Signed)
Pt husband has been verbal about being pts POA and feels as if he is always the last person to know something and he told this RN he gets really "pissed off " about it because he is "entililted" to know something first before anyone else does. Another family member approached this RN about the tension that is in between the daughter and stepdad (pts husband) and that he wants to be in charge.   This RN has beside conversations with husband on plan of care and pt updates. Pts daughter, [place] (also listed as a healthcare agent) called to update nurse on a timeline of events that began March 1st when pt had a stroke and was hospitalized for a month. After her hospitalization, she was d/c'd to [facility] and was there for about a month and she had UTI's( which she's prone to per daughter) and started "acting weird". Pt was admitted back into the hospital where they found that she had an extension of old stroke and minor silent seizure activity per daughter. She then went to [facility] for rehab for about a month, she was not improving. She has been home for about 2 -3 weeks. Since then, her Amantadine was discontinued since if gave her more shaking and tremors. Last day she had that was Tuesday of this week. She also started antibiotics mon evening for a potential UTI.  Pt still not able to follow commands but she is moaning out like she is in pain. Will make MD aware.  Harlee Pursifull Rica Mote, RN 12:38 PM

## 2017-08-02 NOTE — Consult Note (Signed)
Consultation Note Date: 08/02/2017   Patient Name: Ebony Williams  DOB: 09-30-1950  MRN: [id number]  Age / Sex: 67 y.o., female  PCP: Sharilyn Sites, MD Referring Physician: Barton Dubois, MD  Reason for Consultation: Establishing goals of care  HPI/Patient Profile: 67 y.o. female  with past medical history of high blood pressure and cholesterol, recent multifocal left MCA, PCA infarct March, sp TPA,  residual R hemiparesis and aphasia, seizure admitted on 08/01/2017 with altered mental status, likely related to seizure.   Clinical Assessment and Goals of Care: Ebony Williams is lying in bed with her husband and friend at bedside.  She is post tic tal and not responding in any meaningful way. We talk about Ebony Williams chronic health concerns and her stroke in March.  Husband ask about what is next, how his wife will look after this hospitalization.  We talked about her current status of basically bedbound due to stroke approximately 3 months ago.  I share my concern that at this point, so much time is passed since her stroke that she likely has regained as much as she will be able.  We talked about 24 to 48 hours for outcomes, let see what tomorrow brings.  Ebony Williams shares that he is unable to make a specific time for an appointment tomorrow but will be here in the morning and afternoon.  I share that I will seek him out and we will talk.  Conference with nursing staff related to family consult.  Healthcare power of attorney HCPOA -healthcare power of attorney paperwork is noted in epic, names her husband as Optician, dispensing   SUMMARY OF RECOMMENDATIONS   At this point, continue to treat the treatable, no chest compressions. Continue CODE STATUS discussions.  Code Status/Advance Care Planning:  Limited code -all interventions except chest compressions.  Symptom Management:   Per hospitalist, no  additional needs at this time.  Palliative Prophylaxis:   Aspiration and Turn Reposition  Additional Recommendations (Limitations, Scope, Preferences):  Continue to treat the treatable but no chest compressions  Psycho-social/Spiritual:   Desire for further Chaplaincy support:no  Additional Recommendations: Caregiving  Support/Resources and Education on Hospice  Prognosis:   Unable to determine, based on outcomes.   Discharge Planning: To Be Determined, based on outcomes.      Primary Diagnoses: Present on Admission: . Altered mental status   I have reviewed the medical record, interviewed the patient and family, and examined the patient. The following aspects are pertinent.  Past Medical History:  Diagnosis Date  . History of kidney stones    x1  . Hypertension    Social History   Socioeconomic History  . Marital status: Married    Spouse name: Not on file  . Number of children: Not on file  . Years of education: Not on file  . Highest education level: Not on file  Occupational History  . Not on file  Social Needs  . Financial resource strain: Not on file  . Food insecurity:  Worry: Not on file    Inability: Not on file  . Transportation needs:    Medical: Not on file    Non-medical: Not on file  Tobacco Use  . Smoking status: Never Smoker  . Smokeless tobacco: Never Used  Substance and Sexual Activity  . Alcohol use: Yes    Comment: very rare  . Drug use: No  . Sexual activity: Not on file  Lifestyle  . Physical activity:    Days per week: Not on file    Minutes per session: Not on file  . Stress: Not on file  Relationships  . Social connections:    Talks on phone: Not on file    Gets together: Not on file    Attends religious service: Not on file    Active member of club or organization: Not on file    Attends meetings of clubs or organizations: Not on file    Relationship status: Not on file  Other Topics Concern  . Not on file    Social History Narrative  . Not on file   Family History  Problem Relation Age of Onset  . Diabetes Mother   . Hypertension Mother    Scheduled Meds: . amantadine  200 mg Oral BID  . amLODipine  5 mg Oral Daily  . aspirin  81 mg Oral Daily  . atorvastatin  40 mg Oral Daily  . clopidogrel  75 mg Oral Daily  . FLUoxetine  40 mg Oral QHS  . insulin aspart  0-9 Units Subcutaneous TID WC  . pantoprazole  40 mg Oral Daily   Continuous Infusions: . 0.9 % NaCl with KCl 40 mEq / L 75 mL/hr (08/02/17 1431)  . levETIRAcetam 1,000 mg (08/02/17 1431)   PRN Meds:.hydrALAZINE, morphine injection, ondansetron **OR** ondansetron (ZOFRAN) IV Medications Prior to Admission:  Prior to Admission medications   Medication Sig Start Date End Date Taking? Authorizing Provider  acetaminophen (TYLENOL) 325 MG tablet Take 325-650 mg by mouth daily as needed for mild pain or moderate pain.    Yes [provider]  ALPRAZolam (XANAX) 0.25 MG tablet Take 0.25 mg by mouth at bedtime as needed for sleep.  07/26/17  Yes [provider]  amantadine (SYMMETREL) 100 MG capsule Take 2 capsules (200 mg total) by mouth 2 (two) times daily. 05/08/17  Yes Angiulli, Lavon Paganini, PA-C  amLODipine (NORVASC) 5 MG tablet Take 5 mg by mouth daily.   Yes [provider]  aspirin 81 MG chewable tablet Chew 1 tablet (81 mg total) by mouth daily. 05/08/17  Yes Angiulli, Lavon Paganini, PA-C  atorvastatin (LIPITOR) 40 MG tablet Take 1 tablet (40 mg total) by mouth daily. 06/22/17  Yes Patrecia Pour, MD  clopidogrel (PLAVIX) 75 MG tablet Take 1 tablet (75 mg total) by mouth daily. 05/08/17  Yes Angiulli, Lavon Paganini, PA-C  FLUoxetine (PROZAC) 40 MG capsule Take 1 capsule (40 mg total) by mouth at bedtime. 06/22/17  Yes Patrecia Pour, MD  levETIRAcetam (KEPPRA) 1000 MG tablet Take 1 tablet (1,000 mg total) by mouth 2 (two) times daily. 06/22/17  Yes Patrecia Pour, MD  metFORMIN (GLUCOPHAGE) 500 MG tablet Take 1 tablet (500 mg  total) by mouth daily with breakfast. 05/08/17  Yes Angiulli, Lavon Paganini, PA-C  nitrofurantoin, macrocrystal-monohydrate, (MACROBID) 100 MG capsule Take 100 mg by mouth 2 (two) times daily. 07/30/17  Yes [provider]  nystatin (NYSTATIN) powder Apply topically See admin instructions. Apply topically to groin and  outer vaginal area twice daily prn for rash/irritation   Yes [provider]  ondansetron (ZOFRAN) 4 MG tablet Take 4 mg by mouth every 8 (eight) hours as needed for nausea or vomiting.   Yes [provider]  pantoprazole (PROTONIX) 40 MG tablet Take 1 tablet (40 mg total) by mouth daily. 05/07/17  Yes Angiulli, Lavon Paganini, PA-C  polyethylene glycol (MIRALAX / GLYCOLAX) packet Take 17 g by mouth daily. Mix with 8-10 oz water and drink   Yes [provider]  traMADol (ULTRAM) 50 MG tablet Take 0.5 tablets (25 mg total) by mouth every 6 (six) hours as needed. 07/11/17  Yes Oscar La, Arlo C, PA-C   Allergies  Allergen Reactions  . Sulfa Antibiotics Diarrhea and Itching   Review of Systems  Unable to perform ROS: Mental status change    Physical Exam  Constitutional: She appears distressed.  Acutely ill  HENT:  Head: Atraumatic.  Cardiovascular: Normal rate.  Pulmonary/Chest: Effort normal. No respiratory distress.  Abdominal: Soft. She exhibits no distension.  Neurological:  Post tictal  Skin: Skin is warm and dry.  Nursing note and vitals reviewed.   Vital Signs: BP (!) 142/117   Pulse 88   Temp 98 F (36.7 C) (Axillary)   Resp 20   Ht 5\' 5"  (1.651 m)   Wt 65.1 kg (143 lb 8.3 oz)   SpO2 99%   BMI 23.88 kg/m  Pain Scale: CPOT       SpO2: SpO2: 99 % O2 Device:SpO2: 99 % O2 Flow Rate: .O2 Flow Rate (L/min): 10 L/min  IO: Intake/output summary:   Intake/Output Summary (Last 24 hours) at 08/02/2017 1455 Last data filed at 08/02/2017 0400 Gross per 24 hour  Intake 815 ml  Output -  Net 815 ml    LBM:   Baseline Weight: Weight: 63.6  kg (140 lb 3.4 oz) Most recent weight: Weight: 65.1 kg (143 lb 8.3 oz)     Palliative Assessment/Data:   Flowsheet Rows     Most Recent Value  Intake Tab  Referral Department  Hospitalist  Unit at Time of Referral  Intermediate Care Unit  Palliative Care Primary Diagnosis  Neurology  Date Notified  08/02/17  Palliative Care Type  New Palliative care  Reason for referral  Advance Care Planning, Clarify Goals of Care  Date of Admission  08/01/17  Date first seen by Palliative Care  08/02/17  # of days Palliative referral response time  0 Day(s)  # of days IP prior to Palliative referral  1  Clinical Assessment  Palliative Performance Scale Score  30%  Pain Max last 24 hours  Not able to report  Pain Min Last 24 hours  Not able to report  Dyspnea Max Last 24 Hours  Not able to report  Dyspnea Min Last 24 hours  Not able to report  Psychosocial & Spiritual Assessment  Palliative Care Outcomes  Patient/Family meeting held?  Yes  Who was at the meeting?  husband   Patient/Family wishes: Interventions discontinued/not started   Other (Comment) [NO chest compressions]      Time In: 1435 Time Out: 1505 Time Total: 30 minutes Greater than 50%  of this time was spent counseling and coordinating care related to the above assessment and plan.  Signed by: Drue Novel, NP   Please contact Palliative Medicine Team phone at [phone] for questions and concerns.  For individual provider: See Shea Evans

## 2017-08-02 NOTE — Telephone Encounter (Signed)
Patient's care taker left a message stating that patient is back in ICU following seizures.  Patient is unresponsive.  Family is asking for Dr. Ena Dawley advise if it would be better to transfer patient to [facility]?

## 2017-08-03 ENCOUNTER — Inpatient Hospital Stay (HOSPITAL_COMMUNITY)
Admit: 2017-08-03 | Discharge: 2017-08-03 | Disposition: A | Discharge status: Home / Self Care | Payer: Medicare Other | Attending: Internal Medicine | Admitting: Internal Medicine

## 2017-08-03 DIAGNOSIS — Z7189 Other specified counseling: Secondary | ICD-10-CM

## 2017-08-03 LAB — BASIC METABOLIC PANEL
ANION GAP: 9 (ref 5–15)
BUN: 6 mg/dL — ABNORMAL LOW (ref 8–23)
CHLORIDE: 110 mmol/L (ref 98–111)
CO2: 23 mmol/L (ref 22–32)
Calcium: 9.2 mg/dL (ref 8.9–10.3)
Creatinine, Ser: 0.32 mg/dL — ABNORMAL LOW (ref 0.44–1.00)
GFR calc Af Amer: >60 mL/min (ref >60)
GFR calc non Af Amer: >60 mL/min (ref >60)
GLUCOSE: 120 mg/dL — AB (ref 70–99)
POTASSIUM: 3.1 mmol/L — AB (ref 3.5–5.1)
Sodium: 142 mmol/L (ref 135–145)

## 2017-08-03 LAB — GLUCOSE, CAPILLARY
GLUCOSE-CAPILLARY: 94 mg/dL (ref 70–99)
GLUCOSE-CAPILLARY: 106 mg/dL — AB (ref 70–99)
Glucose-Capillary: 112 mg/dL — ABNORMAL HIGH (ref 70–99)
Glucose-Capillary: 106 mg/dL — ABNORMAL HIGH (ref 70–99)

## 2017-08-03 LAB — HIV ANTIBODY (ROUTINE TESTING W REFLEX): HIV Screen 4th Generation wRfx: NONREACTIVE

## 2017-08-03 MED ORDER — ORAL CARE MOUTH RINSE
15.0000 mL | Freq: Two times a day (BID) | OROMUCOSAL | Status: DC
  Administered 2017-08-03 – 2017-08-07 (×9): 15 mL via OROMUCOSAL

## 2017-08-03 NOTE — Progress Notes (Signed)
[place] A. Merlene Laughter, MD     [email or web address]          Ebony Williams is an 67 y.o. female.   Assessment/Plan:  1.  Altered mental status most likely due to multiple seizures.  She has improved significantly and is most likely at baseline.  Continue with the higher dose Keppra.      2.  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  3.  Marked neurological deficits from recent left MCA infarct with the patient having visual aphasia and right hemiplegia.    She has improved markedly overnight.  No additional seizures are reported.     GENERAL: The patient lays in bed and appears to be in significant discomfort but no acute distress.  HEENT: Supple and without evidence of trauma  ABDOMEN: soft  EXTREMITIES: No edema   BACK: Normal  SKIN: Normal by inspection.    MENTAL STATUS:   She is awake and alert.  She follows commands briskly although obviously has residual moderate severe aphasia.  She does state her full name and follow midline and appendicular commands on the left side.  CRANIAL NERVES: Pupils are equal, round and reactive to light; extra ocular movements are full by oculocephalic reflexes; visual fields shows reduced response on the right visual field consistent with a homonymous hemianopia; upper and lower facial muscles are normal in strength and symmetric, there is no flattening of the nasolabial folds;  MOTOR: She moves the left side vigorously and well.  Bulk and tone are normal.  The right side shows a marked spasticity especially involving the right upper extremity.  The right upper extremity is graded as 1-2/5.  Right leg 3/5 with the patient withdrawing to painful stimuli purposefully.  COORDINATION: She seems to have episodic volitional shaking on the left side.  No clear tonic-clonic movements.  No tremors.  No parkinsonism.  No  myoclonus.  No dysmetria.  REFLEXES: Deep tendon reflexes are symmetrical and normal.   SENSATION: She responds to painful stimuli on both sides about equally.      Objective: Vital signs in last 24 hours: Temp:  [98.3 F (36.8 C)-98.6 F (37 C)] 98.3 F (36.8 C) (06/28 0909) Pulse Rate:  [91-104] 102 (06/28 1500) Resp:  [16-31] 26 (06/28 1500) BP: (149-194)/(75-154) 183/94 (06/28 1500) SpO2:  [96 %-99 %] 99 % (06/28 1500)  Intake/Output from previous day: 06/27 0701 - 06/28 0700 In: 1436.3 [I.V.:1236.3; IV Piggyback:200] Out: 1000 [Urine:1000] Intake/Output this shift: Total I/O In: 863.3 [I.V.:730; IV Piggyback:133.3] Out: -  Nutritional status:  Diet Order           Diet NPO time specified  Diet effective now           Lab Results: Results for orders placed or performed during the hospital encounter of 08/01/17 (from the past 48 hour(s))  CBC with Differential     Status: Abnormal   Collection Time: 08/01/17  8:27 PM  Result Value Ref Range   WBC 19.1 (H) 4.0 - 10.5 K/uL    Comment: WHITE COUNT CONFIRMED ON SMEAR ATYPICAL LYMPHOCYTES    RBC 5.12 (H) 3.87 - 5.11 MIL/uL   Hemoglobin 16.6 (H) 12.0 - 15.0 g/dL   HCT 47.4 (H) 36.0 - 46.0 %   MCV 92.6 78.0 - 100.0 fL   MCH 32.4 26.0 - 34.0 pg   MCHC 35.0 30.0 - 36.0 g/dL  RDW 13.1 11.5 - 15.5 %   Platelets 332 150 - 400 K/uL   Neutrophils Relative % 44 %   Neutro Abs 8.4 (H) 1.7 - 7.7 K/uL   Lymphocytes Relative 49 %   Lymphs Abs 9.4 (H) 0.7 - 4.0 K/uL   Monocytes Relative 6 %   Monocytes Absolute 1.1 (H) 0.1 - 1.0 K/uL   Eosinophils Relative 1 %   Eosinophils Absolute 0.2 0.0 - 0.7 K/uL   Basophils Relative 0 %   Basophils Absolute 0.1 0.0 - 0.1 K/uL    Comment: Performed at [facility], [address] [place], [place] [address]  Comprehensive metabolic panel     Status: Abnormal   Collection Time: 08/01/17  8:27 PM  Result Value Ref Range   Sodium 141 135 - 145 mmol/L   Potassium 3.8 3.5 - 5.1  mmol/L   Chloride 104 98 - 111 mmol/L    Comment: Please note change in reference range.   CO2 17 (L) 22 - 32 mmol/L   Glucose, Bld 173 (H) 70 - 99 mg/dL    Comment: Please note change in reference range.   BUN 19 8 - 23 mg/dL    Comment: Please note change in reference range.   Creatinine, Ser 0.78 0.44 - 1.00 mg/dL   Calcium 9.8 8.9 - 10.3 mg/dL   Total Protein 8.1 6.5 - 8.1 g/dL   Albumin 4.5 3.5 - 5.0 g/dL   AST 35 15 - 41 U/L   ALT <5 0 - 44 U/L   Alkaline Phosphatase 62 38 - 126 U/L   Total Bilirubin 1.2 0.3 - 1.2 mg/dL   GFR calc non Af Amer >60 >60 mL/min   GFR calc Af Amer >60 >60 mL/min    Comment: (NOTE) The eGFR has been calculated using the CKD EPI equation. This calculation has not been validated in all clinical situations. eGFR's persistently <60 mL/min signify possible Chronic Kidney Disease.    Anion gap 20 (H) 5 - 15    Comment: Performed at [facility], [address] [place],  [place] [address]  Magnesium     Status: None   Collection Time: 08/01/17  8:27 PM  Result Value Ref Range   Magnesium 1.8 1.7 - 2.4 mg/dL    Comment: Performed at [facility], [address] [place], [place] [address]  Troponin I     Status: None   Collection Time: 08/01/17  8:27 PM  Result Value Ref Range   Troponin I <0.03 <0.03 ng/mL    Comment: Performed at [facility], [address], [place], [place] [address]  Hemoglobin A1c     Status: None   Collection Time: 08/01/17  8:27 PM  Result Value Ref Range   Hgb A1c MFr Bld 5.1 4.8 - 5.6 %    Comment: (NOTE) Pre diabetes:          5.7%-6.4% Diabetes:              >6.4% Glycemic control for   <7.0% adults with diabetes    Mean Plasma Glucose 99.67 mg/dL    Comment: Performed at [facility] [address], [place], [place] [address]  HIV antibody (Routine Testing)     Status: None   Collection Time: 08/01/17  8:27 PM  Result Value Ref Range   HIV Screen 4th Generation wRfx Non Reactive Non Reactive    Comment:  (NOTE) Performed At: [facility] [address] [place], [place] [id number] Rush Farmer  MD [id number] Performed at [facility], [address], [place], [place] [address]   I-Stat CG4 Lactic Acid, ED     Status: Abnormal   Collection Time: 08/01/17  8:40 PM  Result Value Ref Range   Lactic Acid, Venous 10.72 (HH) 0.5 - 1.9 mmol/L  Urinalysis, Routine w reflex microscopic     Status: Abnormal   Collection Time: 08/01/17  9:58 PM  Result Value Ref Range   Color, Urine YELLOW YELLOW   APPearance CLEAR CLEAR   Specific Gravity, Urine 1.018 1.005 - 1.030   pH 5.0 5.0 - 8.0   Glucose, UA 50 (A) NEGATIVE mg/dL   Hgb urine dipstick NEGATIVE NEGATIVE   Bilirubin Urine NEGATIVE NEGATIVE   Ketones, ur 80 (A) NEGATIVE mg/dL   Protein, ur 100 (A) NEGATIVE mg/dL   Nitrite NEGATIVE NEGATIVE   Leukocytes, UA NEGATIVE NEGATIVE   RBC / HPF 0-5 0 - 5 RBC/hpf   WBC, UA 0-5 0 - 5 WBC/hpf   Bacteria, UA RARE (A) NONE SEEN   Squamous Epithelial / LPF 0-5 0 - 5    Comment: Performed at [facility], [address], [place], [place] [address]  I-Stat CG4 Lactic Acid, ED     Status: None   Collection Time: 08/01/17 10:51 PM  Result Value Ref Range   Lactic Acid, Venous 1.61 0.5 - 1.9 mmol/L  MRSA PCR Screening     Status: None   Collection Time: 08/01/17 11:33 PM  Result Value Ref Range   MRSA by PCR NEGATIVE NEGATIVE    Comment:        The GeneXpert MRSA Assay (FDA approved for NASAL specimens only), is one component of a comprehensive MRSA colonization surveillance program. It is not intended to diagnose MRSA infection nor to guide or monitor treatment for MRSA infections. Performed at [facility], [address], [place], [place] [address]   Glucose, capillary     Status: Abnormal   Collection Time: 08/02/17 12:10 AM  Result Value Ref Range   Glucose-Capillary 134 (H) 70 - 99 mg/dL   Comment 1 Notify RN    Comment 2 Document in Chart   CBC     Status: Abnormal    Collection Time: 08/02/17  5:27 AM  Result Value Ref Range   WBC 11.5 (H) 4.0 - 10.5 K/uL   RBC 4.78 3.87 - 5.11 MIL/uL   Hemoglobin 15.5 (H) 12.0 - 15.0 g/dL   HCT 43.7 36.0 - 46.0 %   MCV 91.4 78.0 - 100.0 fL   MCH 32.4 26.0 - 34.0 pg   MCHC 35.5 30.0 - 36.0 g/dL   RDW 13.0 11.5 - 15.5 %   Platelets 253 150 - 400 K/uL    Comment: Performed at [facility], [address] [place],  [address]  Comprehensive metabolic panel     Status: Abnormal   Collection Time: 08/02/17  5:27 AM  Result Value Ref Range   Sodium 140 135 - 145 mmol/L   Potassium 3.0 (L) 3.5 - 5.1 mmol/L    Comment: DELTA CHECK NOTED   Chloride 106 98 - 111 mmol/L    Comment: Please note change in reference range.   CO2 21 (L) 22 - 32 mmol/L   Glucose, Bld 138 (H) 70 - 99 mg/dL    Comment: Please note change in reference range.   BUN 14 8 - 23 mg/dL    Comment: Please note change in reference range.   Creatinine, Ser 0.42 (L) 0.44 -  1.00 mg/dL   Calcium 8.9 8.9 - 10.3 mg/dL   Total Protein 7.7 6.5 - 8.1 g/dL   Albumin 4.4 3.5 - 5.0 g/dL   AST 20 15 - 41 U/L   ALT 19 0 - 44 U/L    Comment: Please note change in reference range.   Alkaline Phosphatase 53 38 - 126 U/L   Total Bilirubin 1.2 0.3 - 1.2 mg/dL   GFR calc non Af Amer >60 >60 mL/min   GFR calc Af Amer >60 >60 mL/min    Comment: (NOTE) The eGFR has been calculated using the CKD EPI equation. This calculation has not been validated in all clinical situations. eGFR's persistently <60 mL/min signify possible Chronic Kidney Disease.    Anion gap 13 5 - 15    Comment: Performed at [facility], [address] [place], [place] [address]  Glucose, capillary     Status: Abnormal   Collection Time: 08/02/17  6:10 AM  Result Value Ref Range   Glucose-Capillary 119 (H) 70 - 99 mg/dL   Comment 1 Notify RN    Comment 2 Document in Chart   Glucose, capillary     Status: Abnormal   Collection Time: 08/02/17  8:15 AM  Result Value Ref Range    Glucose-Capillary 112 (H) 70 - 99 mg/dL  Glucose, capillary     Status: Abnormal   Collection Time: 08/02/17 11:53 AM  Result Value Ref Range   Glucose-Capillary 100 (H) 70 - 99 mg/dL  Glucose, capillary     Status: Abnormal   Collection Time: 08/02/17  4:05 PM  Result Value Ref Range   Glucose-Capillary 100 (H) 70 - 99 mg/dL  Glucose, capillary     Status: Abnormal   Collection Time: 08/02/17  9:13 PM  Result Value Ref Range   Glucose-Capillary 113 (H) 70 - 99 mg/dL  Basic metabolic panel     Status: Abnormal   Collection Time: 08/03/17  4:04 AM  Result Value Ref Range   Sodium 142 135 - 145 mmol/L   Potassium 3.1 (L) 3.5 - 5.1 mmol/L   Chloride 110 98 - 111 mmol/L    Comment: Please note change in reference range.   CO2 23 22 - 32 mmol/L   Glucose, Bld 120 (H) 70 - 99 mg/dL    Comment: Please note change in reference range.   BUN 6 (L) 8 - 23 mg/dL    Comment: Please note change in reference range.   Creatinine, Ser 0.32 (L) 0.44 - 1.00 mg/dL   Calcium 9.2 8.9 - 10.3 mg/dL   GFR calc non Af Amer >60 >60 mL/min   GFR calc Af Amer >60 >60 mL/min    Comment: (NOTE) The eGFR has been calculated using the CKD EPI equation. This calculation has not been validated in all clinical situations. eGFR's persistently <60 mL/min signify possible Chronic Kidney Disease.    Anion gap 9 5 - 15    Comment: Performed at [facility], [address] [place], [place] [address]  Glucose, capillary     Status: Abnormal   Collection Time: 08/03/17  7:35 AM  Result Value Ref Range   Glucose-Capillary 112 (H) 70 - 99 mg/dL  Glucose, capillary     Status: Abnormal   Collection Time: 08/03/17 11:24 AM  Result Value Ref Range   Glucose-Capillary 106 (H) 70 - 99 mg/dL    Lipid Panel No results for input(s): CHOL, TRIG, HDL, CHOLHDL, VLDL, LDLCALC in the last 72 hours.  Studies/Results:   Medications:  Scheduled Meds: . amantadine  200 mg Oral BID  . amLODipine  5 mg Oral Daily  . aspirin   81 mg Oral Daily  . atorvastatin  40 mg Oral Daily  . clopidogrel  75 mg Oral Daily  . FLUoxetine  40 mg Oral QHS  . insulin aspart  0-9 Units Subcutaneous TID WC  . mouth rinse  15 mL Mouth Rinse BID  . pantoprazole  40 mg Oral Daily   Continuous Infusions: . 0.9 % NaCl with KCl 40 mEq / L 100 mL/hr (08/03/17 1340)  . levETIRAcetam Stopped (08/03/17 1400)   PRN Meds:.hydrALAZINE, morphine injection, ondansetron **OR** ondansetron (ZOFRAN) IV     LOS: 2 days   Maxwell Martorano A. Merlene Laughter, M.D.  Diplomate, Tax adviser of Psychiatry and Neurology ( Neurology).

## 2017-08-03 NOTE — Progress Notes (Signed)
Palliative: Ebony Williams is resting in bed, she appears to be still postictal.  Present today at bedside his husband of 28 years, Ebony Williams.  A friend of Ebony Williams daughter is at bedside, she leaves so that we may have a private discussion.  Mr. Nelon states that she assists in caregiving for Ebony Williams.  I have reviewed medical records including EPIC notes, labs and imaging, received report from bedside nursing staff, assessed the patient and then met at the bedside along with husband to discuss diagnosis prognosis, GOC, EOL wishes, disposition and options.  We discussed a brief life review of the patient.  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  Mr. Spring states that his wife enjoyed working in the kitchen, cooking for other people, especially the homebound prior to this illness.  As far as functional and nutritional status, Ebony Williams had been quite healthy with only hypertension as her chronic illness prior to her stroke.  She was very active around the home and in the yard.  At this point, she is incontinent of bowel and bladder, Mr. Hoshino states that she is able to pull herself into a standing position, but not ambulate.   We discussed their current illness and what it means in the larger context of their on-going co-morbidities.  Natural disease trajectory and expectations at EOL were discussed.  I share a diagram of the chronic illness pathway, what is normal and expected, I share that there will be a next time that Ebony Williams becomes ill.  I attempted to elicit values and goals of care important to the patient.  Mr. Spring states that his wife did not discuss end-of-life issues.  He does endorse that he himself would not want to live in her current state.  I share my worries about Ebony Williams continued decline, her likely need to be institutionalized in the future.  I ask Mr. Thalmann what worries him, and he states "her quality of life".  Advanced  directives, concepts specific to code status, were considered and discussed.  Mr. Spring states they both decided no CPR, but okay for intubation if there is something that seems reversible.  I share that her current physical limitations are not likely reversible.  We talked about 24 to 48 hours more for outcomes.  As we are finishing our conversation, Ebony Williams father arrives.  Ebony Williams becomes tearful.  Questions and concerns were addressed.  The family was encouraged to call with questions or concerns.  We plan for family meeting on Monday around 1 PM. 50 minutes, extended time Tasha Dove, NP Palliative Medicine Team Team Phone # [phone]     

## 2017-08-03 NOTE — Progress Notes (Signed)
SLP Cancellation Note  Patient Details Name: Ebony Williams MRN: [id number] DOB: 08/29/1950   Cancelled treatment:         SLP attempted BSE; Pt would not/could not follow directions.  Stated she swallowed by shaking head 'yes' when requested by SLP; however, no movement detected upon palpation.  SLP will follow up for BSE readiness at a later time.  Joneen Boers  M.A., [email or web address]   Aziz Slape W Omarri Eich 08/03/2017, 2:14 PM

## 2017-08-03 NOTE — Progress Notes (Signed)
[facility]  Patient Status: Active (receiving services up to time of hospitalization)  [facility] is providing the following services: RN, PT and OT  If patient discharges after hours, please call [phone].   [place] 08/03/2017, 4:07 PM

## 2017-08-03 NOTE — Telephone Encounter (Addendum)
Contacted daughter and passed on Dr. Ena Dawley medical advice. Daughter verbalized understanding

## 2017-08-03 NOTE — Progress Notes (Signed)
EEG complete - results pending 

## 2017-08-03 NOTE — Progress Notes (Signed)
PROGRESS NOTE    Ebony Williams  TWS:[id number] DOB: [birth year] DOA: 08/01/2017 PCP: Sharilyn Sites, MD   Brief Narrative:   67 y.o. 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  As per note from the EMS patient appeared to be confused and was not attempting to respond to questions verbally but her speech was garbled.  Shortly after their arrival she began having generalized tonic-clonic activity and was given 5 mg of Versed.  Admitted for further evaluation and management. Neurology consult pending. Actively declining; and no progressing at home.    Assessment & Plan: 1-AMS/seizure activity  -in the setting of most likely post ictal state on presentation -more alert today and able to follow commands; still not talking much; but with residual aphasia from her stroke and most likely close to baseline.  -EEG ordered, but pending  -keppra loading dose given and IV keppra at adjusted dose recommended by neurology. -will follow results and further rec's  2-hx of MCA, PCA infarct with right hemiplegia and aphasia  -plan is to resume ASA, plavix and lipitor when tolerating PO -patient with active declining and poor quality -palliative care consulted; will follow final [place] decisions.  3-HTN -continue PRN hydralazine -follow VS -BP under fair control -resume home oral antihypertensive regimen when tolerating PO   4-difficulty taking PO's -due to current mentation  -follow evaluation by speech therapist  -continue oral care   5-recent UTI -no signs of active infection currently -follow culture -was just taking macrobid prior to admission (according to family completed 3-4 days)  6-depression/anxiety  -will continue prozac and  amantadine when able to take PO's -overall mood stable and appropriate for her circumstances   7-type 2 diabetes: with vascular process -continue holding oral hypoglycemic regimen  -continue SSI -follow CBG's trend and adjust treatment as needed   8-hypokalemia -repleted through her IV -follow trend -Mg WNL -K 3.1   DVT prophylaxis: SCD's Code Status: partial DNR Family Communication: no family at bedside  Disposition Plan: remains inpatient, continue adjusted dose of keppra; follow EEG; follow clinical response and recommendations by neurology.   Consultants:   Neurology   Procedures:   See below for x-ray reports   EEG: ordered and pending   Antimicrobials:  Anti-infectives (From admission, onward)   None      Subjective: Afebrile, no CP, no abd pain, following commands today and more oriented. No active tonic clonic seizure appreciated.   Objective: Vitals:   08/03/17 0800 08/03/17 0855 08/03/17 0900 08/03/17 0909  BP: (!) 170/99  (!) 153/75   Pulse: 96  100 100  Resp: 17  20 16   Temp:    98.3 F (36.8 C)  TempSrc:    Axillary  SpO2: 97% 98% 98% 97%  Weight:      Height:        Intake/Output Summary (Last 24 hours) at 08/03/2017 0932 Last data filed at 08/03/2017 0447 Gross per 24 hour  Intake 1270 ml  Output 1000 ml  Net 270 ml   Filed Weights   08/01/17 2349 08/02/17 0400  Weight: 63.6 kg (140 lb 3.4 oz) 65.1 kg (143 lb 8.3 oz)    Examination: General exam: Alert, awake, oriented x 2; patient following commands, even no talking. Unchanged right hemiplegia. Respiratory system: CTA  bilaterally. No using accessory muscles.  Cardiovascular system: Rate control, no murmurs, no rubs, no gallops.   Gastrointestinal system: Abdomen is nondistended, soft and nontender.  No masses felt.  Positive bowel sounds Central nervous system: Patient with right hemiplegia (unchanged), no new deficits appreciated.  No pronation drift on the left side.  Positive  chronic aphasia. Extremities: No cyanosis, no clubbing, positive pedal pulses appreciated on exam. Skin: No rashes, no petechiae; no open wounds.  Data Reviewed: I have personally reviewed following labs and imaging studies  CBC: Recent Labs  Lab 08/01/17 2027 08/02/17 0527  WBC 19.1* 11.5*  NEUTROABS 8.4*  --   HGB 16.6* 15.5*  HCT 47.4* 43.7  MCV 92.6 91.4  PLT 332 240   Basic Metabolic Panel: Recent Labs  Lab 08/01/17 2027 08/02/17 0527 08/03/17 0404  NA 141 140 142  K 3.8 3.0* 3.1*  CL 104 106 110  CO2 17* 21* 23  GLUCOSE 173* 138* 120*  BUN 19 14 6*  CREATININE 0.78 0.42* 0.32*  CALCIUM 9.8 8.9 9.2  MG 1.8  --   --    GFR: Estimated Creatinine Clearance: 59.7 mL/min (A) (by C-G formula based on SCr of 0.32 mg/dL (L)).   Liver Function Tests: Recent Labs  Lab 08/01/17 2027 08/02/17 0527  AST 35 20  ALT <5 19  ALKPHOS 62 53  BILITOT 1.2 1.2  PROT 8.1 7.7  ALBUMIN 4.5 4.4   Cardiac Enzymes: Recent Labs  Lab 08/01/17 2027  TROPONINI <0.03   CBG: Recent Labs  Lab 08/02/17 0815 08/02/17 1153 08/02/17 1605 08/02/17 2113 08/03/17 0735  GLUCAP 112* 100* 100* 113* 112*   Urine analysis:    Component Value Date/Time   COLORURINE YELLOW 08/01/2017 2158   APPEARANCEUR CLEAR 08/01/2017 2158   LABSPEC 1.018 08/01/2017 2158   PHURINE 5.0 08/01/2017 2158   GLUCOSEU 50 (A) 08/01/2017 2158   HGBUR NEGATIVE 08/01/2017 2158   BILIRUBINUR NEGATIVE 08/01/2017 2158   KETONESUR 80 (A) 08/01/2017 2158   PROTEINUR 100 (A) 08/01/2017 2158   NITRITE NEGATIVE 08/01/2017 2158   LEUKOCYTESUR NEGATIVE 08/01/2017 2158    Recent Results (from the past 240 hour(s))  MRSA PCR Screening     Status: None   Collection Time: 08/01/17 11:33 PM  Result Value Ref Range Status   MRSA by PCR NEGATIVE NEGATIVE Final    Comment:        The GeneXpert MRSA Assay (FDA approved for NASAL specimens only), is one component of a comprehensive MRSA colonization surveillance  program. It is not intended to diagnose MRSA infection nor to guide or monitor treatment for MRSA infections. Performed at [facility], [address] [place], [place] [address]      Radiology Studies: Ct Head Wo Contrast  Result Date: 08/01/2017 CLINICAL DATA:  Altered level of consciousness. New onset seizure. Recent CVA. EXAM: CT HEAD WITHOUT CONTRAST TECHNIQUE: Contiguous axial images were obtained from the base of the skull through the vertex without intravenous contrast. COMPARISON:  04/16/2017 head CT.  06/16/2017 prior MRI. FINDINGS: Brain: Continued evolution of encephalomalacia in left basal ganglia and left parieto-occipital lobe. No evidence of parenchymal hemorrhage or extra-axial fluid collection. No mass lesion, mass effect, or midline shift. No CT evidence of acute infarction. Nonspecific moderate subcortical and periventricular white matter hypodensity, most in keeping with chronic small vessel ischemic change. Cerebral volume is age appropriate. Ex vacuo dilatation of the occipital and frontal horns of the left lateral ventricle. No acute ventriculomegaly. Vascular: No  acute abnormality. Skull: No evidence of calvarial fracture. Sinuses/Orbits: No fluid levels. Mild mucoperiosteal thickening and/or mucous retention cysts versus polyps in the dependent left maxillary sinus. Other:  The mastoid air cells are unopacified. IMPRESSION: 1.  No evidence of acute intracranial abnormality. 2. Continued evolution of encephalomalacia in the left basal ganglia and left parieto-occipital lobe. 3. Moderate chronic small vessel ischemic changes in the cerebral white matter. Electronically Signed   By: Ilona Sorrel M.D.   On: 08/01/2017 21:14    Scheduled Meds: . amantadine  200 mg Oral BID  . amLODipine  5 mg Oral Daily  . aspirin  81 mg Oral Daily  . atorvastatin  40 mg Oral Daily  . clopidogrel  75 mg Oral Daily  . FLUoxetine  40 mg Oral QHS  . insulin aspart  0-9 Units Subcutaneous TID  WC  . pantoprazole  40 mg Oral Daily   Continuous Infusions: . 0.9 % NaCl with KCl 40 mEq / L 75 mL/hr (08/03/17 0055)  . levETIRAcetam Stopped (08/03/17 9024)     LOS: 2 days    Time spent: 30 minutes    Barton Dubois, MD [facility] Pager [phone]  If 7PM-7AM, please contact night-coverage [email or web address] Password [facility] 08/03/2017, 9:32 AM

## 2017-08-03 NOTE — Procedures (Signed)
[name], MD     [email or web address]           HISTORY: The patient is a 66 year old female who presents with multiple seizures.  MEDICATIONS: Scheduled Meds: . amantadine  200 mg Oral BID  . amLODipine  5 mg Oral Daily  . aspirin  81 mg Oral Daily  . atorvastatin  40 mg Oral Daily  . clopidogrel  75 mg Oral Daily  . FLUoxetine  40 mg Oral QHS  . insulin aspart  0-9 Units Subcutaneous TID WC  . mouth rinse  15 mL Mouth Rinse BID  . pantoprazole  40 mg Oral Daily   Continuous Infusions: . 0.9 % NaCl with KCl 40 mEq / L 100 mL/hr (08/03/17 1340)  . levETIRAcetam Stopped (08/03/17 1400)   PRN Meds:.hydrALAZINE, morphine injection, ondansetron **OR** ondansetron (ZOFRAN) IV  Prior to Admission medications   Medication Sig Start Date End Date Taking? Authorizing Provider  acetaminophen (TYLENOL) 325 MG tablet Take 325-650 mg by mouth daily as needed for mild pain or moderate pain.    Yes [provider]  ALPRAZolam (XANAX) 0.25 MG tablet Take 0.25 mg by mouth at bedtime as needed for sleep.  07/26/17  Yes [provider]  amantadine (SYMMETREL) 100 MG capsule Take 2 capsules (200 mg total) by mouth 2 (two) times daily. 05/08/17  Yes Angiulli, Lavon Paganini, PA-C  amLODipine (NORVASC) 5 MG tablet Take 5 mg by mouth daily.   Yes [provider]  aspirin 81 MG chewable tablet Chew 1 tablet (81 mg total) by mouth daily. 05/08/17  Yes Angiulli, Lavon Paganini, PA-C  atorvastatin (LIPITOR) 40 MG tablet Take 1 tablet (40 mg total) by mouth daily. 06/22/17  Yes Patrecia Pour, MD  clopidogrel (PLAVIX) 75 MG tablet Take 1 tablet (75 mg total) by mouth daily. 05/08/17  Yes Angiulli, Lavon Paganini, PA-C  FLUoxetine (PROZAC) 40 MG capsule Take 1 capsule (40 mg total) by mouth at bedtime. 06/22/17  Yes Patrecia Pour, MD  levETIRAcetam (KEPPRA) 1000 MG tablet Take 1 tablet (1,000 mg total) by mouth 2 (two) times daily. 06/22/17  Yes Patrecia Pour, MD  metFORMIN  (GLUCOPHAGE) 500 MG tablet Take 1 tablet (500 mg total) by mouth daily with breakfast. 05/08/17  Yes Angiulli, Lavon Paganini, PA-C  nitrofurantoin, macrocrystal-monohydrate, (MACROBID) 100 MG capsule Take 100 mg by mouth 2 (two) times daily. 07/30/17  Yes [provider]  nystatin (NYSTATIN) powder Apply topically See admin instructions. Apply topically to groin and outer vaginal area twice daily prn for rash/irritation   Yes [provider]  ondansetron (ZOFRAN) 4 MG tablet Take 4 mg by mouth every 8 (eight) hours as needed for nausea or vomiting.   Yes [provider]  pantoprazole (PROTONIX) 40 MG tablet Take 1 tablet (40 mg total) by mouth daily. 05/07/17  Yes Angiulli, Lavon Paganini, PA-C  polyethylene glycol (MIRALAX / GLYCOLAX) packet Take 17 g by mouth daily. Mix with 8-10 oz water and drink   Yes [provider]  traMADol (ULTRAM) 50 MG tablet Take 0.5 tablets (25 mg total) by mouth every 6 (six) hours as needed. 07/11/17  Yes Lassen, Arlo C, PA-C      ANALYSIS: A 16 channel recording using standard 10 20 measurements is conducted for 23 minutes.   The background activity on the right gets as high as 6.5-7 hertz.  On the left, it gets as high as 5.5-6 hertz.  There is frequent generalized delta slowing  rhythmic in characteristics with more prominence involving the frontal central region and more of prominent on the left side.  Sometimes these involving to triphasic looking waves.  Evidence of stage 2 NON-REM sleep is observed with K complexes and sleep spindles.  There are a couple of sharp wave activity phase reverses at T3 on the left side.  No clear electrographic seizures however.  Photic stimulation and hyperventilation are not carried out.   IMPRESSION: 1.  Infrequent left temporal epileptiform discharges without evidence of electrographic seizures. 2.  Generalized slowing is observed (moderate on the left side and mild on the right side).  The lateral slowing on  the left side suggest lateral cerebral disturbance and likely correlates to a known history of left hemispheric large vessel stroke. 3.  Episodic generalized rhythmic delta activity which typically seen in toxic metabolic processes.      Hendy Brindle A. Merlene Laughter, M.D.  Diplomate, Tax adviser of Psychiatry and Neurology ( Neurology).

## 2017-08-03 NOTE — Care Management Important Message (Signed)
Important Message  Patient Details  Name: Ebony Williams MRN: [id number] Date of Birth: [birth year]   Medicare Important Message Given:  Yes    Shelda Altes 08/03/2017, 12:01 PM

## 2017-08-04 LAB — GLUCOSE, CAPILLARY
Glucose-Capillary: 105 mg/dL — ABNORMAL HIGH (ref 70–99)
Glucose-Capillary: 107 mg/dL — ABNORMAL HIGH (ref 70–99)
Glucose-Capillary: 111 mg/dL — ABNORMAL HIGH (ref 70–99)
Glucose-Capillary: 99 mg/dL (ref 70–99)
Glucose-Capillary: 108 mg/dL — ABNORMAL HIGH (ref 70–99)

## 2017-08-04 LAB — BASIC METABOLIC PANEL
ANION GAP: 9 (ref 5–15)
BUN: 12 mg/dL (ref 8–23)
CALCIUM: 9.4 mg/dL (ref 8.9–10.3)
CO2: 21 mmol/L — ABNORMAL LOW (ref 22–32)
Chloride: 112 mmol/L — ABNORMAL HIGH (ref 98–111)
Creatinine, Ser: 0.43 mg/dL — ABNORMAL LOW (ref 0.44–1.00)
Glucose, Bld: 119 mg/dL — ABNORMAL HIGH (ref 70–99)
Potassium: 4.1 mmol/L (ref 3.5–5.1)
Sodium: 142 mmol/L (ref 135–145)

## 2017-08-04 LAB — LACTIC ACID, PLASMA: LACTIC ACID, VENOUS: 1 mmol/L (ref 0.5–1.9)

## 2017-08-04 MED ORDER — LABETALOL HCL 5 MG/ML IV SOLN
5.0000 mg | INTRAVENOUS | Status: DC | PRN
  Filled 2017-08-04: qty 4

## 2017-08-04 NOTE — Progress Notes (Signed)
PROGRESS NOTE    Ebony Williams  POE:[id number] DOB: 01-07-1951 DOA: 08/01/2017 PCP: Sharilyn Sites, MD   Brief Narrative:   67 y.o. 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  As per note from the EMS patient appeared to be confused and was not attempting to respond to questions verbally but her speech was garbled.  Shortly after their arrival she began having generalized tonic-clonic activity and was given 5 mg of Versed.  Admitted for further evaluation and management. Neurology consult pending. Actively declining; and no progressing at home.    Assessment & Plan: 1-AMS/seizure activity  -in the setting of most likely post ictal state on presentation -mentation continue improving  -EEG demonstrating some delta waves and and abnormal epileptiform reading. -keppra dose adjusted to 1500 mg BID; will follow neurology further rec's. -will follow results and further rec's -if stable will transfer out of stepdown tomorrow.  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 Next meeting planned for Monday.  3-HTN -continue PRN hydralazine -follow VS -BP under fair control -resume oral antihypertensive meds.   4-difficulty taking PO's -waiting on formal evaluation by speech therapist  -continue oral care   5-recent UTI -no signs of active infection currently -follow culture -was just taking macrobid prior to admission (according to family completed 3-4 days)  6-depression/anxiety  -will continue prozac and amantadine  -overall mood stable and  appropriate for her circumstances   7-type 2 diabetes: with vascular process -continue holding oral hypoglycemic regimen  -continue SSI -follow CBG's trend and adjust treatment as needed   8-hypokalemia -repleted through her IV -follow trend -K WNL now   DVT prophylaxis: SCD's Code Status: partial DNR Family Communication: no family at bedside  Disposition Plan: remains inpatient, continue adjusted dose of keppra; follow EEG; follow clinical response and recommendations by neurology.   Consultants:   Neurology   Procedures:   See below for x-ray reports   EEG: ordered and pending   Antimicrobials:  Anti-infectives (From admission, onward)   None      Subjective: No fever, no chest pain, no abdominal pain; patient able to follow commands and tolerated meds with apple sauce.   Objective: Vitals:   08/04/17 1100 08/04/17 1200 08/04/17 1300 08/04/17 1400  BP: (!) 153/81 (!) 155/75 (!) 165/83 (!) 158/79  Pulse: (!) 102 (!) 105 (!) 102 98  Resp: 17 18 17  (!) 23  Temp:      TempSrc:      SpO2: 98% 97% 99% 98%  Weight:      Height:        Intake/Output Summary (Last 24 hours) at 08/04/2017 1803 Last data filed at 08/04/2017 1405 Gross per 24 hour  Intake 4133.3 ml  Output 600 ml  Net 3533.3 ml   Filed Weights   08/01/17 2349 08/02/17 0400  Weight: 63.6 kg (140 lb 3.4 oz) 65.1 kg (143 lb 8.3 oz)    Examination: General exam: Alert, awake, oriented x 2.  Unable to talk; but following commands appropriately and interacting during interview with yes/no answers for questioning and knotting with her head.  No seizure activity  appreciated. Respiratory system: Clear to auscultation. Respiratory effort normal. Cardiovascular system:RRR. No murmurs, rubs, gallops. Gastrointestinal system: Abdomen is nondistended, soft and nontender. No organomegaly or masses felt. Normal bowel sounds heard. Central nervous system: Alert and oriented.  No new focal deficits appreciated.   Unchanged right hemiplegia and aphasia. Extremities: No C/C/E, +pedal pulses; patient with contracture appreciated on her right hand. Skin: No rashes, lesions or ulcers  Data Reviewed: I have personally reviewed following labs and imaging studies  CBC: Recent Labs  Lab 08/01/17 2027 08/02/17 0527  WBC 19.1* 11.5*  NEUTROABS 8.4*  --   HGB 16.6* 15.5*  HCT 47.4* 43.7  MCV 92.6 91.4  PLT 332 412   Basic Metabolic Panel: Recent Labs  Lab 08/01/17 2027 08/02/17 0527 08/03/17 0404 08/04/17 0535  NA 141 140 142 142  K 3.8 3.0* 3.1* 4.1  CL 104 106 110 112*  CO2 17* 21* 23 21*  GLUCOSE 173* 138* 120* 119*  BUN 19 14 6* 12  CREATININE 0.78 0.42* 0.32* 0.43*  CALCIUM 9.8 8.9 9.2 9.4  MG 1.8  --   --   --    GFR: Estimated Creatinine Clearance: 59.7 mL/min (A) (by C-G formula based on SCr of 0.43 mg/dL (L)).   Liver Function Tests: Recent Labs  Lab 08/01/17 2027 08/02/17 0527  AST 35 20  ALT <5 19  ALKPHOS 62 53  BILITOT 1.2 1.2  PROT 8.1 7.7  ALBUMIN 4.5 4.4   Cardiac Enzymes: Recent Labs  Lab 08/01/17 2027  TROPONINI <0.03   CBG: Recent Labs  Lab 08/03/17 2321 08/04/17 0622 08/04/17 0747 08/04/17 1142 08/04/17 1721  GLUCAP 106* 105* 107* 111* 99   Urine analysis:    Component Value Date/Time   COLORURINE YELLOW 08/01/2017 2158   APPEARANCEUR CLEAR 08/01/2017 2158   LABSPEC 1.018 08/01/2017 2158   PHURINE 5.0 08/01/2017 2158   GLUCOSEU 50 (A) 08/01/2017 2158   HGBUR NEGATIVE 08/01/2017 2158   BILIRUBINUR NEGATIVE 08/01/2017 2158   KETONESUR 80 (A) 08/01/2017 2158   PROTEINUR 100 (A) 08/01/2017 2158   NITRITE NEGATIVE 08/01/2017 2158   LEUKOCYTESUR NEGATIVE 08/01/2017 2158    Recent Results (from the past 240 hour(s))  MRSA PCR Screening     Status: None   Collection Time: 08/01/17 11:33 PM  Result Value Ref Range Status   MRSA by PCR NEGATIVE NEGATIVE Final    Comment:        The GeneXpert MRSA Assay (FDA approved for NASAL  specimens only), is one component of a comprehensive MRSA colonization surveillance program. It is not intended to diagnose MRSA infection nor to guide or monitor treatment for MRSA infections. Performed at [facility], [address], [place], [place] [address]      Radiology Studies: No results found.  Scheduled Meds: . amantadine  200 mg Oral BID  . amLODipine  5 mg Oral Daily  . aspirin  81 mg Oral Daily  . atorvastatin  40 mg Oral Daily  . clopidogrel  75 mg Oral Daily  . FLUoxetine  40 mg Oral QHS  . insulin aspart  0-9 Units Subcutaneous TID WC  . mouth rinse  15 mL Mouth Rinse BID  . pantoprazole  40 mg Oral Daily   Continuous Infusions: . 0.9 % NaCl with KCl 40 mEq / L 100 mL/hr (08/04/17 1008)  . levETIRAcetam Stopped (08/04/17 1405)     LOS: 3 days    Time spent: 30 minutes    Barton Dubois, MD  [facility] Pager [phone]  If 7PM-7AM, please contact night-coverage [email or web address] Password [facility] 08/04/2017, 6:03 PM

## 2017-08-04 NOTE — Progress Notes (Addendum)
Bedside swallow screen performed. Pt eager and able to hold cup of water with left hand. Taking small sips of water without difficulty. No difficulty with applesauce. Able to swallow pills crushed in applesauce with sips of water with no difficulty. Lungs clear bilaterally. Oral care performed after.

## 2017-08-05 LAB — GLUCOSE, CAPILLARY
GLUCOSE-CAPILLARY: 87 mg/dL (ref 70–99)
GLUCOSE-CAPILLARY: 91 mg/dL (ref 70–99)
GLUCOSE-CAPILLARY: 95 mg/dL (ref 70–99)
GLUCOSE-CAPILLARY: 92 mg/dL (ref 70–99)

## 2017-08-05 MED ORDER — ALPRAZOLAM 0.5 MG PO TABS
0.5000 mg | ORAL_TABLET | Freq: Once | ORAL | Status: AC
  Administered 2017-08-05: 0.5 mg via ORAL
  Filled 2017-08-05: qty 1

## 2017-08-05 NOTE — Progress Notes (Signed)
PROGRESS NOTE    Ebony Williams  NTI:[id number] DOB: [birth year] DOA: 08/01/2017 PCP: Sharilyn Sites, MD   Brief Narrative:   67 y.o. 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  As per note from the EMS patient appeared to be confused and was not attempting to respond to questions verbally but her speech was garbled.  Shortly after their arrival she began having generalized tonic-clonic activity and was given 5 mg of Versed.  Admitted for further evaluation and management. Neurology consult pending. Actively declining; and no progressing at home.    Assessment & Plan: 1-AMS/seizure activity  -in the setting of most likely post ictal state on presentation -mentation continue improving  -EEG demonstrating some delta waves and and abnormal epileptiform reading. -keppra dose adjusted to 1500 mg BID. 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 Next meeting planned for Monday. -formal speech therapy pending -after passing bedside swallowing by ICU nurse, dysphagia 2 diet ordered.   3-HTN -continue PRN hydralazine -follow VS -BP under fair control -resume oral antihypertensive meds.   4-difficulty taking PO's -waiting on formal evaluation by speech therapist  -continue oral care   5-recent UTI -no signs of active infection currently -follow culture -was just taking macrobid prior to admission (according to family completed 3-4 days)  6-depression/anxiety  -will continue prozac and  amantadine  -overall mood stable and appropriate for her circumstances   7-type 2 diabetes: with vascular process -continue holding oral hypoglycemic regimen  -continue SSI -follow CBG's trend and adjust treatment as needed   8-hypokalemia -repleted through her IV -follow trend -K WNL now   DVT prophylaxis: SCD's Code Status: partial DNR Family Communication: no family at bedside  Disposition Plan: remains inpatient, continue adjusted dose of keppra; follow EEG; follow clinical response and recommendations by neurology.   Consultants:   Neurology   Procedures:   See below for x-ray reports   EEG: ordered and pending   Antimicrobials:  Anti-infectives (From admission, onward)   None      Subjective: Afebrile, no CP, no nausea, no vomiting and no further seizure appreciated. Patient is following commands.  Objective: Vitals:   08/05/17 1300 08/05/17 1400 08/05/17 1500 08/05/17 1559  BP: 138/73 (!) 155/78 (!) 142/83   Pulse: 86 87 83   Resp:  20 16   Temp:    (!) 97.5 F (36.4 C)  TempSrc:    Oral  SpO2: 98% 99% 99%   Weight:      Height:        Intake/Output Summary (Last 24 hours) at 08/05/2017 1707 Last data filed at 08/05/2017 1421 Gross per 24 hour  Intake 2635 ml  Output 850 ml  Net 1785 ml   Filed Weights   08/01/17 2349 08/02/17 0400 08/05/17 0500  Weight: 63.6 kg (140 lb 3.4 oz) 65.1 kg (143 lb 8.3 oz) 65.1 kg (143 lb 8.3 oz)    Examination: General exam: Alert, awake, oriented and following commands. Tolerating oral meds inside apple sauce and in no distress. No further seizures appreciated. Baseline aphasia and  right hemiplegia. Respiratory system: Clear to auscultation. Respiratory effort normal. Cardiovascular system:RRR. No murmurs, rubs, gallops. Gastrointestinal system: Abdomen is nondistended, soft and nontender. No organomegaly or masses felt. Normal bowel sounds heard. Central nervous system: Alert and oriented. No focal neurological  deficits. Extremities: No C/C/E, +pedal pulses; right upper extremity with hand contracture.  Skin: No rashes, lesions or ulcers   Data Reviewed: I have personally reviewed following labs and imaging studies  CBC: Recent Labs  Lab 08/01/17 2027 08/02/17 0527  WBC 19.1* 11.5*  NEUTROABS 8.4*  --   HGB 16.6* 15.5*  HCT 47.4* 43.7  MCV 92.6 91.4  PLT 332 749   Basic Metabolic Panel: Recent Labs  Lab 08/01/17 2027 08/02/17 0527 08/03/17 0404 08/04/17 0535  NA 141 140 142 142  K 3.8 3.0* 3.1* 4.1  CL 104 106 110 112*  CO2 17* 21* 23 21*  GLUCOSE 173* 138* 120* 119*  BUN 19 14 6* 12  CREATININE 0.78 0.42* 0.32* 0.43*  CALCIUM 9.8 8.9 9.2 9.4  MG 1.8  --   --   --    GFR: Estimated Creatinine Clearance: 59.7 mL/min (A) (by C-G formula based on SCr of 0.43 mg/dL (L)).   Liver Function Tests: Recent Labs  Lab 08/01/17 2027 08/02/17 0527  AST 35 20  ALT <5 19  ALKPHOS 62 53  BILITOT 1.2 1.2  PROT 8.1 7.7  ALBUMIN 4.5 4.4   Cardiac Enzymes: Recent Labs  Lab 08/01/17 2027  TROPONINI <0.03   CBG: Recent Labs  Lab 08/04/17 1142 08/04/17 1721 08/04/17 2218 08/05/17 0817 08/05/17 1251  GLUCAP 111* 99 108* 87 91   Urine analysis:    Component Value Date/Time   COLORURINE YELLOW 08/01/2017 2158   APPEARANCEUR CLEAR 08/01/2017 2158   LABSPEC 1.018 08/01/2017 2158   PHURINE 5.0 08/01/2017 2158   GLUCOSEU 50 (A) 08/01/2017 2158   HGBUR NEGATIVE 08/01/2017 2158   BILIRUBINUR NEGATIVE 08/01/2017 2158   KETONESUR 80 (A) 08/01/2017 2158   PROTEINUR 100 (A) 08/01/2017 2158   NITRITE NEGATIVE 08/01/2017 2158   LEUKOCYTESUR NEGATIVE 08/01/2017 2158    Recent Results (from the past 240 hour(s))  MRSA PCR Screening     Status: None   Collection Time: 08/01/17 11:33 PM  Result Value Ref Range Status   MRSA by PCR NEGATIVE NEGATIVE Final    Comment:        The GeneXpert MRSA Assay (FDA approved for NASAL specimens only), is one component of a comprehensive  MRSA colonization surveillance program. It is not intended to diagnose MRSA infection nor to guide or monitor treatment for MRSA infections. Performed at [facility], [address], [place], [place] [address]      Radiology Studies: No results found.  Scheduled Meds: . amantadine  200 mg Oral BID  . amLODipine  5 mg Oral Daily  . aspirin  81 mg Oral Daily  . atorvastatin  40 mg Oral Daily  . clopidogrel  75 mg Oral Daily  . FLUoxetine  40 mg Oral QHS  . insulin aspart  0-9 Units Subcutaneous TID WC  . mouth rinse  15 mL Mouth Rinse BID  . pantoprazole  40 mg Oral Daily   Continuous Infusions: . 0.9 % NaCl with KCl 40 mEq / L 100 mL/hr (08/05/17 1421)  . levETIRAcetam Stopped (08/05/17 1408)     LOS: 4 days    Time spent: 30 minutes    Barton Dubois, MD [facility] Pager [phone]  If 7PM-7AM,  please contact night-coverage [email or web address] Password [email or web address] 08/05/2017, 5:07 PM

## 2017-08-05 NOTE — Progress Notes (Signed)
Pt has right arm residual weakness post CVA. Daughter request PT eval and treat. Notified Dr. Dyann Kief via text.

## 2017-08-06 ENCOUNTER — Ambulatory Visit: Payer: Medicare Other | Admitting: Adult Health

## 2017-08-06 DIAGNOSIS — E440 Moderate protein-calorie malnutrition: Secondary | ICD-10-CM

## 2017-08-06 LAB — BASIC METABOLIC PANEL
ANION GAP: 11 (ref 5–15)
BUN: 8 mg/dL (ref 8–23)
CHLORIDE: 109 mmol/L (ref 98–111)
CO2: 19 mmol/L — ABNORMAL LOW (ref 22–32)
CREATININE: 0.36 mg/dL — AB (ref 0.44–1.00)
Calcium: 8.8 mg/dL — ABNORMAL LOW (ref 8.9–10.3)
GFR calc non Af Amer: >60 mL/min (ref >60)
Glucose, Bld: 72 mg/dL (ref 70–99)
Potassium: 4.2 mmol/L (ref 3.5–5.1)
SODIUM: 139 mmol/L (ref 135–145)

## 2017-08-06 LAB — CBC
HEMATOCRIT: 44.7 % (ref 36.0–46.0)
HEMOGLOBIN: 15.1 g/dL — AB (ref 12.0–15.0)
MCH: 32.3 pg (ref 26.0–34.0)
MCHC: 33.8 g/dL (ref 30.0–36.0)
MCV: 95.5 fL (ref 78.0–100.0)
Platelets: 216 10*3/uL (ref 150–400)
RBC: 4.68 MIL/uL (ref 3.87–5.11)
RDW: 13.3 % (ref 11.5–15.5)
WBC: 10.3 10*3/uL (ref 4.0–10.5)

## 2017-08-06 LAB — GLUCOSE, CAPILLARY
GLUCOSE-CAPILLARY: 109 mg/dL — AB (ref 70–99)
GLUCOSE-CAPILLARY: 88 mg/dL (ref 70–99)
GLUCOSE-CAPILLARY: 88 mg/dL (ref 70–99)
GLUCOSE-CAPILLARY: 94 mg/dL (ref 70–99)
Glucose-Capillary: 81 mg/dL (ref 70–99)
Glucose-Capillary: 215 mg/dL — ABNORMAL HIGH (ref 70–99)

## 2017-08-06 MED ORDER — ALPRAZOLAM 0.25 MG PO TABS: 0.2500 mg | ORAL_TABLET | Freq: Every evening | ORAL | Status: DC | PRN

## 2017-08-06 MED ORDER — TRAMADOL HCL 50 MG PO TABS: 50.0000 mg | ORAL_TABLET | Freq: Four times a day (QID) | ORAL | Status: DC | PRN

## 2017-08-06 MED ORDER — LEVETIRACETAM 500 MG PO TABS
1500.0000 mg | ORAL_TABLET | Freq: Two times a day (BID) | ORAL | Status: DC
  Administered 2017-08-06 – 2017-08-07 (×3): 1500 mg via ORAL
  Filled 2017-08-06 (×3): qty 3

## 2017-08-06 MED ORDER — TRAMADOL HCL 50 MG PO TABS
50.0000 mg | ORAL_TABLET | Freq: Three times a day (TID) | ORAL | Status: DC | PRN
  Administered 2017-08-07: 50 mg via ORAL
  Filled 2017-08-06: qty 1

## 2017-08-06 MED ORDER — MORPHINE SULFATE (PF) 2 MG/ML IV SOLN: 0.5000 mg | Freq: Three times a day (TID) | INTRAVENOUS | Status: DC | PRN

## 2017-08-06 MED ORDER — AMLODIPINE BESYLATE 5 MG PO TABS
10.0000 mg | ORAL_TABLET | Freq: Every day | ORAL | Status: DC
  Administered 2017-08-07: 10 mg via ORAL
  Filled 2017-08-06: qty 2

## 2017-08-06 MED ORDER — GLUCERNA SHAKE PO LIQD
237.0000 mL | Freq: Three times a day (TID) | ORAL | Status: DC
  Administered 2017-08-06 – 2017-08-07 (×3): 237 mL via ORAL

## 2017-08-06 NOTE — Progress Notes (Signed)
PROGRESS NOTE    Ebony Williams  YFV:[id number] DOB: [birth year] DOA: 08/01/2017 PCP: Sharilyn Sites, MD   Brief Narrative:   67 y.o. 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  As per note from the EMS patient appeared to be confused and was not attempting to respond to questions verbally but her speech was garbled.  Shortly after their arrival she began having generalized tonic-clonic activity and was given 5 mg of Versed.  Admitted for further evaluation and management. Neurology consult pending. Actively declining; and no progressing at home.    Assessment & Plan: 1-AMS/seizure activity  -in the setting of most likely post ictal state on presentation -mentation continue improving  -EEG demonstrating some delta waves and and abnormal epileptiform reading. -keppra dose adjusted to 1500 mg BID; will transition to PO. -no further seizures seen  -will go to [facility] for further care and rehab at discharge.  2-hx of MCA, PCA infarct with right hemiplegia and aphasia/physical deconditioning. -continue ASA, plavix and lipitor -patient with active declining and poor quality; family given las trial for improvement. -palliative care consulted; will follow final [place] decisions; patient to be transfer to [facility] and to have [facility] at time of discharge home. -tolerated dysphagia 2 diet and after evaluation by speech will advance to dys 3 with thin liquids.  3-HTN -continue PRN hydralazine -follow VS -BP elevated -continue norvasc; but adjust dose to 10mg  daily.  4-difficulty taking PO's -appreciate evaluation by speech therapy; will follow dysphagia 3 with thin liquids. -continue good oral care   5-recent UTI -no signs of active infection  currently -follow culture -was just taking macrobid prior to admission (according to family completed 3-4 days)  6-depression/anxiety  -will continue prozac and amantadine  -PRN xanax restarted   7-type 2 diabetes: with vascular process -continue holding oral hypoglycemic regimen  -continue SSI -follow CBG's trend and adjust treatment as needed  -will resume oral hypoglycemic regimen at discharge.  8-hypokalemia -repleted  -K WNL now -follow trend intermittently.  9-moderate protein calorie malnutrition -follow rec's from nutritional service regarding feeding supplements.    DVT prophylaxis: SCD's Code Status: partial DNR Family Communication: daughter bedside  Disposition Plan: with plans to discharge to [facility] in am; will transition AED to PO, discontinue IVF"s and advance diet as recommended by speech therapy.  Consultants:   Neurology   Procedures:   See below for x-ray reports   EEG: ordered and pending   Antimicrobials:  Anti-infectives (From admission, onward)   None      Subjective: No fever, no CP, no SOB; no further seizures appreciated. Following commands appropriately.  Objective: Vitals:   08/06/17 1900 08/06/17 2000 08/06/17 2100 08/06/17 2200  BP: (!) 169/85 (!) 178/86 (!) 163/85 (!) 156/81  Pulse: (!) 102 94 92 86  Resp: (!) 21 20 (!) 22 (!) 21  Temp:   97.9 F (36.6 C)   TempSrc:   Oral   SpO2: 98% 98% 98% 99%  Weight:      Height:        Intake/Output Summary (Last 24 hours) at 08/06/2017 2224 Last data filed at 08/06/2017 1827 Gross per 24 hour  Intake -  Output 1125 ml  Net -1125 ml   Filed Weights   08/02/17 0400 08/05/17 0500 08/06/17 0500  Weight: 65.1 kg (143 lb 8.3 oz) 65.1 kg (143 lb 8.3 oz) 65.1 kg (143 lb 8.3 oz)    Examination: General exam: Alert, awake, oriented; following commands appropriately, in no acute distress. No further seizure seen. Respiratory system: Clear to auscultation. Respiratory effort  normal. Cardiovascular system:RRR. No murmurs, rubs, gallops. Gastrointestinal system: Abdomen is nondistended, soft and nontender. No organomegaly or masses felt. Normal bowel sounds heard. Central nervous system: Alert and oriented. Following commands; no new deficit. Extremities: No cyanosis or clubbing, right hemiplegia and RUE with contracture in her hand. Skin: No rashes, lesions or ulcers Psychiatry: no agitation currently. Per nursing reports she was anxious overnight.   Data Reviewed: I have personally reviewed following labs and imaging studies  CBC: Recent Labs  Lab 08/01/17 2027 08/02/17 0527 08/06/17 0415  WBC 19.1* 11.5* 10.3  NEUTROABS 8.4*  --   --   HGB 16.6* 15.5* 15.1*  HCT 47.4* 43.7 44.7  MCV 92.6 91.4 95.5  PLT 332 253 761   Basic Metabolic Panel: Recent Labs  Lab 08/01/17 2027 08/02/17 0527 08/03/17 0404 08/04/17 0535 08/06/17 0415  NA 141 140 142 142 139  K 3.8 3.0* 3.1* 4.1 4.2  CL 104 106 110 112* 109  CO2 17* 21* 23 21* 19*  GLUCOSE 173* 138* 120* 119* 72  BUN 19 14 6* 12 8  CREATININE 0.78 0.42* 0.32* 0.43* 0.36*  CALCIUM 9.8 8.9 9.2 9.4 8.8*  MG 1.8  --   --   --   --    GFR: Estimated Creatinine Clearance: 59.7 mL/min (A) (by C-G formula based on SCr of 0.36 mg/dL (L)).   Liver Function Tests: Recent Labs  Lab 08/01/17 2027 08/02/17 0527  AST 35 20  ALT <5 19  ALKPHOS 62 53  BILITOT 1.2 1.2  PROT 8.1 7.7  ALBUMIN 4.5 4.4   Cardiac Enzymes: Recent Labs  Lab 08/01/17 2027  TROPONINI <0.03   CBG: Recent Labs  Lab 08/06/17 0602 08/06/17 0809 08/06/17 1110 08/06/17 1611 08/06/17 2132  GLUCAP 81 88 88 215* 109*   Urine analysis:    Component Value Date/Time   COLORURINE YELLOW 08/01/2017 2158   APPEARANCEUR CLEAR 08/01/2017 2158   LABSPEC 1.018 08/01/2017 2158   PHURINE 5.0 08/01/2017 2158   GLUCOSEU 50 (A) 08/01/2017 2158   HGBUR NEGATIVE 08/01/2017 2158   BILIRUBINUR NEGATIVE 08/01/2017 2158   KETONESUR 80  (A) 08/01/2017 2158   PROTEINUR 100 (A) 08/01/2017 2158   NITRITE NEGATIVE 08/01/2017 2158   LEUKOCYTESUR NEGATIVE 08/01/2017 2158    Recent Results (from the past 240 hour(s))  MRSA PCR Screening     Status: None   Collection Time: 08/01/17 11:33 PM  Result Value Ref Range Status   MRSA by PCR NEGATIVE NEGATIVE Final    Comment:        The GeneXpert MRSA Assay (FDA approved for NASAL specimens only), is one component of a comprehensive MRSA colonization surveillance program. It is not intended to diagnose MRSA infection nor to guide or monitor treatment for MRSA infections. Performed at [facility], [address], [place], [place] [address]      Radiology Studies: No results found.  Scheduled Meds: . amantadine  200 mg Oral BID  . amLODipine  5 mg Oral Daily  . aspirin  81 mg Oral Daily  . atorvastatin  40 mg Oral Daily  . clopidogrel  75 mg Oral Daily  . feeding supplement (GLUCERNA SHAKE)  237 mL Oral TID BM  .  FLUoxetine  40 mg Oral QHS  . insulin aspart  0-9 Units Subcutaneous TID WC  . levETIRAcetam  1,500 mg Oral BID  . mouth rinse  15 mL Mouth Rinse BID  . pantoprazole  40 mg Oral Daily   Continuous Infusions: . 0.9 % NaCl with KCl 40 mEq / L 100 mL/hr (08/06/17 2111)     LOS: 5 days    Time spent: 30 minutes    Barton Dubois, MD [facility] Pager [phone]  If 7PM-7AM, please contact night-coverage [email or web address] Password [facility] 08/06/2017, 10:24 PM

## 2017-08-06 NOTE — Care Management Important Message (Signed)
Important Message  Patient Details  Name: Ebony Williams MRN: [id number] Date of Birth: 08/25/1950   Medicare Important Message Given:  Yes    Shelda Altes 08/06/2017, 12:09 PM

## 2017-08-06 NOTE — Progress Notes (Signed)
Initial Nutrition Assessment  DOCUMENTATION CODES:   Non-severe (moderate) malnutrition in context of chronic illness  INTERVENTION:   - Glucerna Shake po TID, each supplement provides 220 kcal and 10 grams of protein  - Encourage PO intake  NUTRITION DIAGNOSIS:   Moderate Malnutrition related to chronic illness(recent multifocal left MCA with residual right-sided weakness and aphasia) as evidenced by mild fat depletion, mild muscle depletion, moderate muscle depletion, percent weight loss(14.9% weight loss in less than 4 months).  GOAL:   Patient will meet greater than or equal to 90% of their needs  MONITOR:   PO intake, Supplement acceptance, Weight trends, Labs, I & O's  REASON FOR ASSESSMENT:   Low [place]    ASSESSMENT:   67 year old female who presented to the ED for AMS and seizure-like activity. PMH significant for hypertension, hyperlipidemia, recent multifocal left MCA, PCA in March, s/p TPA with residual right-sided weakness and aphasia, diabetes mellitus type 2, and seizures.  6/30 - pt put on Dysphagia 2 diet 7/1 - SLP recommends Dysphagia 3 diet  Spoke with pt at bedside. No family present at time of RD visit. Pt lethargic but able to answer some questions appropriately.  Pt reports she eats at least 2 meals daily but has no appetite. Pt states, "I'm just not that hungry." Pt states that she cooks. Pt endorses weight loss but unable to provide more details.  RD noted lunch meal tray in room at time of visit with approximately 50% meal completion. Pt willing to try an oral nutrition supplement during admission to maximize PO intake as appetite remains poor.  Per weight history in chart, pt has lost 25 lbs since 04/18/17. This is a 14.9% weight loss in less than 4 months which is significant for timeframe.  Medications reviewed and include: sliding scale Novolog, 40 mg Protonix daily, IV KCl  Labs reviewed: CO2 19 (L), creatinine 0.36 (L) CG's: 88, 88, 81,  94, 92, 95 x 24 hours  UOP: 1525 ml x 24 hours I/O's: +6.2 L since admission  NUTRITION - FOCUSED PHYSICAL EXAM:    Most Recent Value  Orbital Region  No depletion  Upper Arm Region  Mild depletion  Thoracic and Lumbar Region  No depletion  Buccal Region  No depletion  Temple Region  Mild depletion  Clavicle Bone Region  Mild depletion  Clavicle and Acromion Bone Region  Mild depletion  Scapular Bone Region  Unable to assess  Dorsal Hand  No depletion  Patellar Region  Mild depletion  Anterior Thigh Region  Mild depletion  Posterior Calf Region  Moderate depletion  Edema (RD Assessment)  Mild  Hair  Reviewed  Eyes  Reviewed  Mouth  Reviewed  Skin  Reviewed  Nails  Reviewed       Diet Order:   Diet Order           DIET DYS 3 Room service appropriate? Yes; Fluid consistency: Thin  Diet effective now          EDUCATION NEEDS:   Not appropriate for education at this time  Skin:  Skin Assessment: Reviewed RN Assessment  Last BM:  08/05/17  Height:   Ht Readings from Last 1 Encounters:  08/01/17 5\' 5"  (1.651 m)    Weight:   Wt Readings from Last 1 Encounters:  08/06/17 143 lb 8.3 oz (65.1 kg)    Ideal Body Weight:  56.8 kg  BMI:  Body mass index is 23.88 kg/m.  Estimated Nutritional Needs:   Kcal:  4276-7011 kcal/day  Protein:  80-95 grams/day  Fluid:  1.6-1.8 L/day    Gaynell Face, MS, RD, LDN Pager: [phone] Weekend/After Hours: [phone]

## 2017-08-06 NOTE — Progress Notes (Signed)
Palliative: Ebony Williams is resting quietly in bed.  She will make an briefly keep eye contact.  She appears weak, acutely/chronically ill.  There is no family at bedside at this time.   Ebony Williams is alert, oriented to person and place.  She is able to make her basic needs known.  We talked about what's next.  Ebony Williams states that she will go to rehab, possibly for a few more weeks.  We talked about her autonomy, her right to decide what she does and does not want.  Psychosocial support provided, all questions answered. Conference with nursing staff related to plan of care. Conference with hospitalist related to plan of care/disposition. Conference with social work related to plan of care/disposition. 25 minutes Quinn Axe, NP Palliative Medicine Team Team Phone # [phone]

## 2017-08-06 NOTE — Clinical Social Work Note (Signed)
Patient's spouse is agreeable to pay $50/day copays for short term rehab. Patient is being considered at [facility]: Ebony Williams. Patient has 35 days left under her [facility] Medicare benefit. Patient recently discharged from [facility] about three weeks ago as [facility] stated that she was not progressing.  Spouse states that he would like patient in [facility] for about five days while he gets some thing together and in order in the home.   LCSW referred patient to [facility]     Brynn Reznik, Clydene Pugh, LCSW

## 2017-08-06 NOTE — Evaluation (Signed)
Clinical/Bedside Swallow Evaluation Patient Details  Name: Ebony Williams MRN: [id number] Date of Birth: 22-Feb-1950  Today's Date: 08/06/2017 Time: SLP Start Time (ACUTE ONLY): 1209 SLP Stop Time (ACUTE ONLY): 1235 SLP Time Calculation (min) (ACUTE ONLY): 26 min  Past Medical History:  Past Medical History:  Diagnosis Date  . History of kidney stones    x1  . Hypertension    Past Surgical History:  Past Surgical History:  Procedure Laterality Date  . CHOLECYSTECTOMY     laparoscopic  . COLONOSCOPY WITH PROPOFOL N/A 01/12/2015   Procedure: COLONOSCOPY WITH PROPOFOL;  Surgeon: Garlan Fair, MD;  Location: [facility] ENDOSCOPY;  Service: Endoscopy;  Laterality: N/A;  . TUBAL LIGATION     HPI:  67 y.o. 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  As per note from the EMS patient appeared to be confused and was not attempting to respond to questions verbally but her speech was garbled.  Shortly after their arrival she began having generalized tonic-clonic activity and was given 5 mg of Versed.   Assessment / Plan / Recommendation Clinical Impression  Pt seen for BSE following improved alertness and mentation. Pt denies hunger, but does endorse feeling thirsty. Oral motor examination reveals residual right facial weakness from stroke in March of this year. Pt has been consuming regular textures at home prior to admission with assist from her husband for feeding. Pt shows no overt signs of symptoms of aspiration with ice chips, water via cup/straw, puree, D2 lunch, or crackers. Pt with mildly prolonged oral prep and mild buccal residuals with dry cracker, however compensates with alternating with sips of water. Pt requested that he  medications be presented whole in puree. Recommend advancing textures to D3/mech soft with thin liquids and SLP will follow x1 for diet tolerance and upgades as appropriate (could likely advance to regular textures once alertness is more consistent). She will need assist with oral care. Above to RN.   SLP Visit Diagnosis: Dysphagia, unspecified (R13.10)    Aspiration Risk  Mild aspiration risk    Diet Recommendation Dysphagia 3 (Mech soft)   Liquid Administration via: Cup;Straw Medication Administration: Whole meds with puree Supervision: Staff to assist with self feeding;Full supervision/cueing for compensatory strategies Compensations: Slow rate;Small sips/bites Postural Changes: Seated upright at 90 degrees;Remain upright for at least 30 minutes after po intake    Other  Recommendations Oral Care Recommendations: Oral care BID;Staff/trained caregiver to provide oral care Other Recommendations: Clarify dietary restrictions   Follow up Recommendations 24 hour supervision/assistance      Frequency and Duration min 2x/week  1 week       Prognosis Prognosis for Safe Diet Advancement: Good Barriers to Reach Goals: Medication      Swallow Study   General Date of Onset: 08/01/17 HPI: 67 y.o. 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  As per note from the EMS patient appeared to be confused and was not attempting to respond to questions verbally but her speech was garbled.  Shortly after their arrival she began having generalized tonic-clonic activity  and was given 5 mg of Versed. Type of Study: Bedside Swallow Evaluation Previous Swallow Assessment: March and May 2019 s/p CVA Diet Prior to this Study: Dysphagia 2  (chopped);Thin liquids Temperature Spikes Noted: No Respiratory Status: Room air History of Recent Intubation: No Behavior/Cognition: Alert;Cooperative;Pleasant mood;Requires cueing Oral Cavity Assessment: Within Functional Limits(mild lingual coating) Oral Care Completed by SLP: Yes Oral Cavity - Dentition: Adequate natural dentition Vision: Functional for self-feeding Self-Feeding Abilities: Total assist(Typically self feeds with left hand, but has assist) Patient Positioning: Upright in bed Baseline Vocal Quality: Normal;Low vocal intensity Volitional Cough: Weak Volitional Swallow: Able to elicit    Oral/Motor/Sensory Function Overall Oral Motor/Sensory Function: Mild impairment Facial ROM: Reduced right Facial Symmetry: Abnormal symmetry right Facial Strength: Reduced right Lingual ROM: Within Functional Limits Lingual Symmetry: Within Functional Limits Lingual Strength: Within Functional Limits Lingual Sensation: Within Functional Limits Velum: Within Functional Limits Mandible: Within Functional Limits   Ice Chips Ice chips: Within functional limits Presentation: Spoon   Thin Liquid Thin Liquid: Within functional limits Presentation: Cup;Straw    Nectar Thick Nectar Thick Liquid: Not tested   Honey Thick Honey Thick Liquid: Not tested   Puree Puree: Within functional limits Presentation: Spoon   Solid   Thank you,  Genene Churn, CCC-SLP [phone]    Solid: Within functional limits Presentation: Spoon        PORTER,DABNEY 08/06/2017,12:46 PM

## 2017-08-06 NOTE — NC FL2 (Signed)
[place] LEVEL OF CARE SCREENING TOOL     IDENTIFICATION  Patient Name: Ebony Williams Birthdate: 01-02-1951 Sex: female Admission Date (Current Location): 08/01/2017  [facility] and [place] Number:  [employer] and Address:  [facility] [address], [place]      Provider Number: [id number]  Attending Physician Name and Address:  Barton Dubois, MD  Relative Name and Phone Number:       Current Level of Care:   Recommended Level of Care:   Prior Approval Number:    Date Approved/Denied:   PASRR Number: [id number])  Discharge Plan: SNF    Current Diagnoses: Patient Active Problem List   Diagnosis Date Noted  . DNR (do not resuscitate) discussion   . Seizure ([place])   . Palliative care by specialist   . Goals of care, counseling/discussion   . Altered mental status 08/01/2017  . Right arm weakness 06/16/2017  . Aphasia 06/16/2017  . Spastic hemiplegia of right dominant side as late effect of cerebral infarction ([place])   . Hyponatremia   . Labile blood pressure   . Hypokalemia   . Elevated BUN   . Lethargy   . Transaminitis   . Essential hypertension   . Hypertensive crisis   . Left middle cerebral artery stroke ([place]) 04/11/2017  . Depression   . Dysphagia, post-stroke   . Diastolic dysfunction   . Prediabetes   . Leukocytosis   . Cerebral infarction due to occlusion of left middle cerebral artery ([place]) s/p IV tPA 04/06/2017  . Stroke (cerebrum) ([place]) 04/06/2017  . 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 Pain in joint, shoulder region 10/18/2011    Orientation RESPIRATION BLADDER Height & Weight     Self, Time, Place  Normal Incontinent Weight: 143 lb 8.3 oz (65.1 kg) Height:  5\' 5"  (165.1 cm)  BEHAVIORAL SYMPTOMS/MOOD NEUROLOGICAL BOWEL NUTRITION STATUS      Continent Diet(see discharge summary )  AMBULATORY STATUS COMMUNICATION OF NEEDS Skin   Extensive Assist Verbally Normal                     Personal Care Assistance Level of Assistance  Bathing, Feeding, Dressing Bathing Assistance: Limited assistance Feeding assistance: Independent Dressing Assistance: Limited assistance     Functional Limitations Info  Sight, Hearing, Speech Sight Info: Adequate Hearing Info: Adequate Speech Info: Adequate    SPECIAL CARE FACTORS FREQUENCY  Speech therapy             Speech Therapy Frequency: 3x/week      Contractures Contractures Info: Not present    Additional Factors Info  Psychotropic     Psychotropic Info: xanax         Current Medications (08/06/2017):  This is the current hospital active medication list Current Facility-Administered Medications  Medication Dose Route Frequency Provider Last Rate Last Dose  . 0.9 % NaCl with KCl 40 mEq / L  infusion   Intravenous Continuous Barton Dubois, MD 100 mL/hr at 08/06/17 1100 100 mL/hr at 08/06/17 1100  . amantadine (SYMMETREL) capsule 200 mg  200 mg Oral BID Oswald Hillock, MD   200 mg at 08/06/17 0924  . amLODipine (NORVASC) tablet 5 mg  5 mg Oral Daily Oswald Hillock, MD   5 mg at 08/06/17 0924  . aspirin chewable tablet 81 mg  81 mg Oral Daily Oswald Hillock, MD   81 mg at 08/06/17 9937  . atorvastatin (LIPITOR) tablet 40  mg  40 mg Oral Daily Oswald Hillock, MD   40 mg at 08/06/17 1438  . clopidogrel (PLAVIX) tablet 75 mg  75 mg Oral Daily Oswald Hillock, MD   75 mg at 08/06/17 0924  . FLUoxetine (PROZAC) capsule 40 mg  40 mg Oral QHS Oswald Hillock, MD   40 mg at 08/05/17 2107  . insulin aspart (novoLOG) injection 0-9 Units  0-9 Units Subcutaneous TID WC Darrick Meigs, Marge Duncans, MD      . labetalol (NORMODYNE,TRANDATE) injection 5 mg  5 mg Intravenous Q2H PRN Opyd, Ilene Qua, MD      . levETIRAcetam (KEPPRA) tablet 1,500 mg  1,500 mg Oral BID Barton Dubois, MD   1,500 mg at 08/06/17 1255  . MEDLINE mouth rinse  15 mL Mouth Rinse BID Barton Dubois, MD   15 mL at 08/06/17 1114  . morphine 2 MG/ML injection 0.5 mg  0.5 mg  Intravenous Q4H PRN Barton Dubois, MD   0.5 mg at 08/05/17 2126  . ondansetron (ZOFRAN) tablet 4 mg  4 mg Oral Q6H PRN Oswald Hillock, MD       Or  . ondansetron (ZOFRAN) injection 4 mg  4 mg Intravenous Q6H PRN Darrick Meigs, Marge Duncans, MD      . pantoprazole (PROTONIX) EC tablet 40 mg  40 mg Oral Daily Oswald Hillock, MD   40 mg at 08/06/17 8875     Discharge Medications: Please see discharge summary for a list of discharge medications.  Relevant Imaging Results:  Relevant Lab Results:   Additional Information SSN: [id number]  Ihor Gully, LCSW

## 2017-08-06 NOTE — Care Management Note (Signed)
Case Management Note  Patient Details  Name: Ebony Williams MRN: [id number] Date of Birth: Aug 25, 1950  Subjective/Objective:    AMS/seizures. Recent stroke. From home with husband. Active with home health with [facility].  Husband pays OOP for a retired CNA Monday-Friday 8:30-4. Daughter also helps out on weekends. However daughter works full time and has two small children.  No family at bedside. Call to Daughter. She reports she would like to pursue [facility] again if possible. She thinks patient may have more days left for insurance to pay for SNF.  PT/OT eval pending. Daughter is very concerned that patient was given Ativan and Morphine last night and may not participate as much as she could otherwise.  Daughter reports that patient has been making improvements with [facility] PT.  CSW consulted.          Action/Plan: CM following for needs. Discussed [facility] with daughter. She will call to have them do an assessment to see if patient qualifies if patient DC's home.  Will resume home health if patient DC's home.  Daughter would like to talk with CSW to discuss SNF options. CSW notified.    Expected Discharge Date:       08/06/2017           Expected Discharge Plan:     In-House Referral:  Clinical Social Work  Discharge planning Services  CM Consult  Post Acute Care Choice:  Home Health, Resumption of Svcs/PTA Provider Choice offered to:  Adult Children  DME Arranged:    DME Agency:     HH Arranged:    HH Agency:     Status of Service:  In process, will continue to follow  If discussed at Long Length of Stay Meetings, dates discussed:    Additional Comments:  Sinia Antosh, Ieesha Abbasi, RN 08/06/2017, 11:21 AM

## 2017-08-07 ENCOUNTER — Ambulatory Visit: Payer: Self-pay | Admitting: Neurology

## 2017-08-07 ENCOUNTER — Encounter

## 2017-08-07 DIAGNOSIS — E876 Hypokalemia: Secondary | ICD-10-CM

## 2017-08-07 DIAGNOSIS — E440 Moderate protein-calorie malnutrition: Secondary | ICD-10-CM

## 2017-08-07 DIAGNOSIS — I639 Cerebral infarction, unspecified: Secondary | ICD-10-CM

## 2017-08-07 DIAGNOSIS — G8191 Hemiplegia, unspecified affecting right dominant side: Secondary | ICD-10-CM

## 2017-08-07 LAB — GLUCOSE, CAPILLARY
Glucose-Capillary: 98 mg/dL (ref 70–99)
Glucose-Capillary: 193 mg/dL — ABNORMAL HIGH (ref 70–99)

## 2017-08-07 MED ORDER — TRAMADOL HCL 50 MG PO TABS: 50.0000 mg | ORAL_TABLET | Freq: Three times a day (TID) | ORAL | 0 refills | Status: AC | PRN

## 2017-08-07 MED ORDER — GLUCERNA SHAKE PO LIQD: 237.0000 mL | Freq: Three times a day (TID) | ORAL | Status: AC

## 2017-08-07 MED ORDER — AMLODIPINE BESYLATE 10 MG PO TABS: 10.0000 mg | ORAL_TABLET | Freq: Every day | ORAL | Status: AC

## 2017-08-07 MED ORDER — ALPRAZOLAM 0.25 MG PO TABS: 0.2500 mg | ORAL_TABLET | Freq: Every evening | ORAL | 0 refills | Status: DC | PRN

## 2017-08-07 MED ORDER — LEVETIRACETAM 750 MG PO TABS: 1500.0000 mg | ORAL_TABLET | Freq: Two times a day (BID) | ORAL | Status: AC

## 2017-08-07 MED ORDER — ACETAMINOPHEN 325 MG PO TABS: 650.0000 mg | ORAL_TABLET | Freq: Four times a day (QID) | ORAL | Status: AC | PRN

## 2017-08-07 NOTE — Clinical Social Work Placement (Signed)
   CLINICAL SOCIAL WORK PLACEMENT  NOTE  Date:  08/07/2017  Patient Details  Name: Ebony Williams MRN: [id number] Date of Birth: 1950-10-22  Clinical Social Work is seeking post-discharge placement for this patient at the [place] level of care (*CSW will initial, date and re-position this form in  chart as items are completed):  Yes   Patient/family provided with [facility] Work Department's list of facilities offering this level of care within the geographic area requested by the patient (or if unable, by the patient's family).  Yes   Patient/family informed of their freedom to choose among providers that offer the needed level of care, that participate in Medicare, Medicaid or managed care program needed by the patient, have an available bed and are willing to accept the patient.  Yes   Patient/family informed of [facility]'s ownership interest in [facility] and [facility], as well as of the fact that they are under no obligation to receive care at these facilities.  PASRR submitted to EDS on       PASRR number received on       Existing PASRR number confirmed on 08/07/17     FL2 transmitted to all facilities in geographic area requested by pt/family on 08/07/17     FL2 transmitted to all facilities within larger geographic area on       Patient informed that his/her managed care company has contracts with or will negotiate with certain facilities, including the following:        Yes   Patient/family informed of bed offers received.  Patient chooses bed at [facility]     Physician recommends and patient chooses bed at      Patient to be transferred to [facility] on 08/07/17.  Patient to be transferred to facility by [facility]     Patient family notified on 08/07/17 of transfer.  Name of family member notified:  Morey Hummingbird, daughter     PHYSICIAN       Additional Comment: Pt stable for dc today. Pt will transfer  to [facility]. DC clinical sent through the [place]. Pt dtr aware and in agreement. RN to call report and EMS. No other CSW needs for dc.   _______________________________________________ Shade Flood, LCSW 08/07/2017, 2:47 PM

## 2017-08-07 NOTE — Progress Notes (Signed)
Called Receiving facility and gave report to RN who is getting Ms. [place]. And gave her update on transport.

## 2017-08-07 NOTE — Care Management (Signed)
Called daughter per her request.  She reports she was told this morning by attending that her mother was accepted to go to [facility] and had been accepted into [facility] program for when she discharges home.  CM apologized for any confusion.  Unsure of where this information was obtained by MD. CSW had been working to obtain a bed placement for patient. CM discussed [facility] with daughter and gave daughter the number for [facility] on 08/06/2017. CM will send demographics and H & P to [facility] per daughter request on 08/07/2017.  Patient will be going to [employer] today.

## 2017-08-07 NOTE — Discharge Summary (Addendum)
Physician Discharge Summary  Ebony Williams HQI:[id number] DOB: August 28, 1950 DOA: 08/01/2017  PCP: Sharilyn Sites, MD  Admit date: 08/01/2017 Discharge date: 08/07/2017  Time spent: 35 minutes  Recommendations for Outpatient Follow-up:  1. Reassess BP and adjust antihypertensive regimen as needed  2. Repeat BMET to follow electrolytes and renal function trend  3. Repeat CBC to follow Hgb trend.  Discharge Diagnoses:  Active Problems:   Benign essential HTN   Right hemiplegia (HCC)   Altered mental status   Seizure ([place])   Palliative care by specialist   Goals of care, counseling/discussion   DNR (do not resuscitate) discussion   Malnutrition of moderate degree   Discharge Condition: Able and improved.  Patient to be discharged to SNF for care and rehabilitation prior to be transitioned home with activation of [facility] program.   Diet recommendation: Dysphagia 3 diet with thin liquids; heart healthy and modified carbohydrates.  Filed Weights   08/02/17 0400 08/05/17 0500 08/06/17 0500  Weight: 65.1 kg (143 lb 8.3 oz) 65.1 kg (143 lb 8.3 oz) 65.1 kg (143 lb 8.3 oz)    History of present illness:  As per H&P written by Dr. Darrick Meigs on 08/01/17 67 y.o. 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  As per note from the EMS patient appeared to be confused and was not attempting to respond to questions verbally but her speech was garbled.  Shortly after their arrival she began having generalized tonic-clonic activity and was given 5 mg of Versed. When patient arrived in the ED she was somnolent, postictal. As per husband there has been no biting of tongue, no loss of bladder control. No recent illness, patient has been taking her medications  as prescribed. No chest pain or shortness of breath No nausea vomiting or diarrhea No dysuria, urgency frequency of urination.  Hospital Course:  1-AMS/seizure activity  -in the setting of most likely post ictal state on presentation -mentation continue improving; currently following commands appropriately.  -EEG demonstrating some delta waves and and abnormal epileptiform reading on admission.  -keppra dose adjusted to 1500 mg BID; will transition to PO. -no further seizures seen  -will go to SNF for further care and rehab at discharge.  2-hx of MCA, PCA infarct with right hemiplegia and aphasia/physical deconditioning. -continue ASA, plavix and lipitor -patient with active declining and poor quality; family given trial for improvement with SNF rehab. -palliative care consulted; will follow final [facility] decisions; patient to be transfer to SNF and to have [facility] at time of discharge home. -tolerated dysphagia 2 diet and after evaluation by speech will advance to dys 3 with thin liquids. -she is DNR.  3-HTN -continue norvasc; dose adjusted to 10 mg daily for better control. -heart healthy/low sodium diet recommended.   4-difficulty taking PO's -appreciate evaluation by speech therapy; will follow dysphagia 3 with thin liquids as per recommendations. -continue good oral care   5-recent UTI -no signs of active infection currently -culture without any growth  -was just taking macrobid prior to admission (according to family completed 3-4 days course) -no further antibiotics.   6-depression/anxiety  -will continue prozac and amantadine  -continue also PRN xanax QHS  7-type 2 diabetes: with vascular process -Resume oral hypoglycemic regimen and follow modified carbohydrate diets. -Outpatient follow-up of patient's  CBGs/A1c and further adjustment to hypoglycemic regimen as needed.  8-hypokalemia -repleted and within normal limits at discharge. -Repeat basic metabolic panel to  follow electrolytes trend.  9-moderate protein calorie malnutrition -follow rec's from nutritional service regarding feeding supplements.    Procedures:  EEG  See below for x-ray reports.  Consultations:  Palliative care   neurology  Discharge Exam: Vitals:   08/07/17 0700 08/07/17 0745  BP: (!) 163/85   Pulse: 79 94  Resp: (!) 21 (!) 23  Temp:    SpO2: 98% 98%    General exam: Alert, awake, oriented X 2; following commands appropriately, in no acute distress. No further seizure activity appreciated. Respiratory system: Clear to auscultation. Respiratory effort normal. Cardiovascular system:RRR. No murmurs, rubs, gallops. Gastrointestinal system: Abdomen is nondistended, soft and nontender. No organomegaly or masses felt. Normal bowel sounds heard. Central nervous system: Alert and oriented. Following commands appropriately; no new deficit. Extremities: No cyanosis or clubbing, right hemiplegia and RUE with contracture in her hand. Skin: No rashes, lesions or open ulcers Psychiatry: no agitation currently; stable mood.    Discharge Instructions   Discharge Instructions    Diet - low sodium heart healthy   Complete by:  As directed    Discharge instructions   Complete by:  As directed    Take medications as prescribed  Maintain adequate hydration Dysphagia 3 diet with thin liquids  Physical therapy and rehabilitation as per SNF protocol  Repeat CBC and BMET in 10 days to follow electrolyte, renal function and Hgb trend.     Allergies as of 08/07/2017      Reactions   Sulfa Antibiotics Diarrhea, Itching      Medication List    STOP taking these medications   nitrofurantoin (macrocrystal-monohydrate) 100 MG capsule Commonly known as:  MACROBID     TAKE these medications   acetaminophen 325 MG tablet Commonly known as:  TYLENOL Take 2 tablets (650 mg total) by mouth every 6 (six) hours as needed for mild pain, fever or headache. What changed:    how  much to take  when to take this  reasons to take this   ALPRAZolam 0.25 MG tablet Commonly known as:  XANAX Take 1 tablet (0.25 mg total) by mouth at bedtime as needed for anxiety or sleep. What changed:  reasons to take this   amantadine 100 MG capsule Commonly known as:  SYMMETREL Take 2 capsules (200 mg total) by mouth 2 (two) times daily.   amLODipine 10 MG tablet Commonly known as:  NORVASC Take 1 tablet (10 mg total) by mouth daily. What changed:    medication strength  how much to take   aspirin 81 MG chewable tablet Chew 1 tablet (81 mg total) by mouth daily.   atorvastatin 40 MG tablet Commonly known as:  LIPITOR Take 1 tablet (40 mg total) by mouth daily.   clopidogrel 75 MG tablet Commonly known as:  PLAVIX Take 1 tablet (75 mg total) by mouth daily.   feeding supplement (GLUCERNA SHAKE) Liqd Take 237 mLs by mouth 3 (three) times daily between meals.   FLUoxetine 40 MG capsule Commonly known as:  PROZAC Take 1 capsule (40 mg total) by mouth at bedtime.   levETIRAcetam 750 MG tablet Commonly known as:  KEPPRA Take 2 tablets (1,500 mg total) by mouth 2 (two) times daily. What changed:    medication strength  how much to take   metFORMIN 500 MG tablet Commonly known as:  GLUCOPHAGE Take 1  tablet (500 mg total) by mouth daily with breakfast.   nystatin powder Generic drug:  nystatin Apply topically See admin instructions. Apply topically to groin and outer vaginal area twice daily prn for rash/irritation   ondansetron 4 MG tablet Commonly known as:  ZOFRAN Take 4 mg by mouth every 8 (eight) hours as needed for nausea or vomiting.   pantoprazole 40 MG tablet Commonly known as:  PROTONIX Take 1 tablet (40 mg total) by mouth daily.   polyethylene glycol packet Commonly known as:  MIRALAX / GLYCOLAX Take 17 g by mouth daily. Mix with 8-10 oz water and drink   traMADol 50 MG tablet Commonly known as:  ULTRAM Take 1 tablet (50 mg total) by  mouth every 8 (eight) hours as needed for severe pain. What changed:    how much to take  when to take this  reasons to take this      Allergies  Allergen Reactions  . Sulfa Antibiotics Diarrhea and Itching   Follow-up Information    Sharilyn Sites, MD. Schedule an appointment as soon as possible for a visit in 10 day(s).   Specialty:  Family Medicine Why:  after discharge from SNF. Contact information: [place] [address] [phone]           The results of significant diagnostics from this hospitalization (including imaging, microbiology, ancillary and laboratory) are listed below for reference.    Significant Diagnostic Studies: Ct Head Wo Contrast  Result Date: 08/01/2017 CLINICAL DATA:  Altered level of consciousness. New onset seizure. Recent CVA. EXAM: CT HEAD WITHOUT CONTRAST TECHNIQUE: Contiguous axial images were obtained from the base of the skull through the vertex without intravenous contrast. COMPARISON:  04/16/2017 head CT.  06/16/2017 prior MRI. FINDINGS: Brain: Continued evolution of encephalomalacia in left basal ganglia and left parieto-occipital lobe. No evidence of parenchymal hemorrhage or extra-axial fluid collection. No mass lesion, mass effect, or midline shift. No CT evidence of acute infarction. Nonspecific moderate subcortical and periventricular white matter hypodensity, most in keeping with chronic small vessel ischemic change. Cerebral volume is age appropriate. Ex vacuo dilatation of the occipital and frontal horns of the left lateral ventricle. No acute ventriculomegaly. Vascular: No acute abnormality. Skull: No evidence of calvarial fracture. Sinuses/Orbits: No fluid levels. Mild mucoperiosteal thickening and/or mucous retention cysts versus polyps in the dependent left maxillary sinus. Other:  The mastoid air cells are unopacified. IMPRESSION: 1.  No evidence of acute intracranial abnormality. 2. 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 3. Moderate chronic small vessel ischemic changes in the cerebral white matter. Electronically Signed   By: Ilona Sorrel M.D.   On: 08/01/2017 21:14    Microbiology: Recent Results (from the past 240 hour(s))  MRSA PCR Screening     Status: None   Collection Time: 08/01/17 11:33 PM  Result Value Ref Range Status   MRSA by PCR NEGATIVE NEGATIVE Final    Comment:        The GeneXpert MRSA Assay (FDA approved for NASAL specimens only), is one component of a comprehensive MRSA colonization surveillance program. It is not intended to diagnose MRSA infection nor to guide or monitor treatment for MRSA infections. Performed at [facility], [address] [place], [place] [address]      Labs: Basic Metabolic Panel: Recent Labs  Lab 08/01/17 2027 08/02/17 0527 08/03/17 0404 08/04/17 0535 08/06/17 0415  NA 141 140 142 142 139  K 3.8 3.0* 3.1*  4.1 4.2  CL 104 106 110 112* 109  CO2 17* 21* 23 21* 19*  GLUCOSE 173* 138* 120* 119* 72  BUN 19 14 6* 12 8  CREATININE 0.78 0.42* 0.32* 0.43* 0.36*  CALCIUM 9.8 8.9 9.2 9.4 8.8*  MG 1.8  --   --   --   --    Liver Function Tests: Recent Labs  Lab 08/01/17 2027 08/02/17 0527  AST 35 20  ALT <5 19  ALKPHOS 62 53  BILITOT 1.2 1.2  PROT 8.1 7.7  ALBUMIN 4.5 4.4   CBC: Recent Labs  Lab 08/01/17 2027 08/02/17 0527 08/06/17 0415  WBC 19.1* 11.5* 10.3  NEUTROABS 8.4*  --   --   HGB 16.6* 15.5* 15.1*  HCT 47.4* 43.7 44.7  MCV 92.6 91.4 95.5  PLT 332 253 216   Cardiac Enzymes: Recent Labs  Lab 08/01/17 2027  TROPONINI <0.03   CBG: Recent Labs  Lab 08/06/17 0809 08/06/17 1110 08/06/17 1611 08/06/17 2132 08/07/17 0751  GLUCAP 88 88 215* 109* 98    Signed:  Barton Dubois MD.  [facility] 08/07/2017, 9:05 AM

## 2017-08-07 NOTE — Progress Notes (Signed)
Patient being discharged to [facility]. Patient IV access removed. Patient had bowel movement cleaned and placed in paper gown.. Used transfer set for discharge.

## 2017-08-07 NOTE — Evaluation (Signed)
Physical Therapy Evaluation Patient Details Name: Ebony Williams MRN: [id number] DOB: [birth year] Today's Date: 08/07/2017   History of Present Illness  Patricia Yackel  is a 67 y.o. 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  As per note from the EMS patient appeared to be confused and was not attempting to respond to questions verbally but her speech was garbled.  Shortly after their arrival she began having generalized tonic-clonic activity and was given 5 mg of Versed.    Clinical Impression  Patient has difficulty for sitting at bedside due to right sided weakness, able to stand on LLE for stand pivot transfer to chair, but unable to balance on RLE due to weakness and scissoring of leg.  Patient tolerated sitting up in chair with mechanical lift harness in place - RN aware.  Plan:  Patient to be discharged to SNF today and discharged from physical therapy to care of NSG.    Follow Up Recommendations SNF;Supervision/Assistance - 24 hour    Equipment Recommendations  None recommended by PT    Recommendations for Other Services       Precautions / Restrictions Precautions Precautions: Fall Restrictions Weight Bearing Restrictions: No      Mobility  Bed Mobility Overal bed mobility: Needs Assistance Bed Mobility: Supine to Sit     Supine to sit: Max assist        Transfers Overall transfer level: Needs assistance Equipment used: None;1 person hand held assist Transfers: Sit to/from [facility] Sit to Stand: Mod assist Stand pivot transfers: Max assist       General transfer comment: unable to maintain standing balance  Ambulation/Gait                Stairs            Wheelchair  Mobility    Modified Rankin (Stroke Patients Only)       Balance Overall balance assessment: Needs assistance Sitting-balance support: Feet supported;No upper extremity supported Sitting balance-Leahy Scale: Fair     Standing balance support: No upper extremity supported;During functional activity Standing balance-Leahy Scale: Poor                               Pertinent Vitals/Pain Pain Assessment: No/denies pain    Home Living Family/patient expects to be discharged to:: Skilled nursing facility Living Arrangements: Spouse/significant other                    Prior Function Level of Independence: Needs assistance   Gait / Transfers Assistance Needed: non-ambulatory, hoyar lift transfers  ADL's / Homemaking Assistance Needed: assisted by NSG staff        Hand Dominance   Dominant Hand: Right    Extremity/Trunk Assessment   Upper Extremity Assessment Upper Extremity Assessment: Defer to OT evaluation    Lower Extremity Assessment Lower Extremity Assessment: RLE deficits/detail;LLE deficits/detail RLE Deficits / Details: grossly 10-1/5 with ankle plantar flexor contractures LLE Deficits / Details: grossly 3+/5    Cervical / Trunk Assessment Cervical / Trunk Assessment: Normal  Communication   Communication: Expressive difficulties  Cognition Arousal/Alertness: Awake/alert Behavior During Therapy: WFL for tasks assessed/performed Overall Cognitive Status: Within Functional Limits for tasks assessed  General Comments      Exercises     Assessment/Plan    PT Assessment All further PT needs can be met in the next venue of care  PT Problem List Decreased strength;Decreased activity tolerance;Decreased mobility;Decreased balance;Decreased range of motion       PT Treatment Interventions      PT Goals (Current goals can be found in the Care Plan section)  Acute Rehab PT  Goals Patient Stated Goal: return home after rehab PT Goal Formulation: With patient Time For Goal Achievement: 08/21/17 Potential to Achieve Goals: Fair    Frequency     Barriers to discharge        Co-evaluation               AM-PAC PT "6 Clicks" Daily Activity  Outcome Measure Difficulty turning over in bed (including adjusting bedclothes, sheets and blankets)?: A Lot Difficulty moving from lying on back to sitting on the side of the bed? : A Lot Difficulty sitting down on and standing up from a chair with arms (e.g., wheelchair, bedside commode, etc,.)?: A Lot Help needed moving to and from a bed to chair (including a wheelchair)?: A Lot Help needed walking in hospital room?: Total Help needed climbing 3-5 steps with a railing? : Total 6 Click Score: 10    End of Session   Activity Tolerance: Patient tolerated treatment well Patient left: in chair;with call bell/phone within reach Nurse Communication: Mobility status;Other (comment)(RN informed that patient left up in chair with hoyar lift harness in place) PT Visit Diagnosis: Unsteadiness on feet (R26.81);Other abnormalities of gait and mobility (R26.89);Muscle weakness (generalized) (M62.81)    Time: 3154-0086 PT Time Calculation (min) (ACUTE ONLY): 33 min   Charges:   PT Evaluation $PT Eval Moderate Complexity: 1 Mod PT Treatments $Therapeutic Activity: 23-37 mins   PT G Codes:        4:11 PM, 08/11/17 Lonell Grandchild, MPT Physical Therapist with [facility] [phone] office [phone] mobile phone

## 2017-08-08 NOTE — Care Management (Signed)
CM contacted by PCP office. Asking it pt was DC'd home or to SNF. They have received labs for patient and are unsure if they need to address or not, have not been able to communicate with patient or family. PCP's office aware pt has DC'd to SNF.

## 2017-08-24 ENCOUNTER — Emergency Department (HOSPITAL_COMMUNITY): Payer: Medicare Other

## 2017-08-24 ENCOUNTER — Other Ambulatory Visit: Payer: Self-pay

## 2017-08-24 ENCOUNTER — Emergency Department (HOSPITAL_COMMUNITY)
Admission: EM | Admit: 2017-08-24 | Discharge: 2017-08-24 | Disposition: A | Discharge status: Home / Self Care | Payer: Medicare Other | Attending: Emergency Medicine | Admitting: Emergency Medicine

## 2017-08-24 DIAGNOSIS — Z8673 Personal history of transient ischemic attack (TIA), and cerebral infarction without residual deficits: Secondary | ICD-10-CM | POA: Insufficient documentation

## 2017-08-24 DIAGNOSIS — R479 Unspecified speech disturbances: Secondary | ICD-10-CM | POA: Diagnosis present

## 2017-08-24 DIAGNOSIS — Z7982 Long term (current) use of aspirin: Secondary | ICD-10-CM | POA: Insufficient documentation

## 2017-08-24 DIAGNOSIS — Z79899 Other long term (current) drug therapy: Secondary | ICD-10-CM | POA: Diagnosis not present

## 2017-08-24 DIAGNOSIS — Z7984 Long term (current) use of oral hypoglycemic drugs: Secondary | ICD-10-CM | POA: Insufficient documentation

## 2017-08-24 DIAGNOSIS — Z7902 Long term (current) use of antithrombotics/antiplatelets: Secondary | ICD-10-CM | POA: Diagnosis not present

## 2017-08-24 DIAGNOSIS — I6330 Cerebral infarction due to thrombosis of unspecified cerebral artery: Secondary | ICD-10-CM | POA: Insufficient documentation

## 2017-08-24 DIAGNOSIS — R4701 Aphasia: Secondary | ICD-10-CM

## 2017-08-24 DIAGNOSIS — I10 Essential (primary) hypertension: Secondary | ICD-10-CM | POA: Diagnosis not present

## 2017-08-24 LAB — URINALYSIS, ROUTINE W REFLEX MICROSCOPIC
Bilirubin Urine: NEGATIVE
GLUCOSE, UA: NEGATIVE mg/dL
Hgb urine dipstick: NEGATIVE
KETONES UR: 20 mg/dL — AB
Leukocytes, UA: NEGATIVE
Nitrite: NEGATIVE
PROTEIN: 30 mg/dL — AB
Specific Gravity, Urine: 1.019 (ref 1.005–1.030)
pH: 5 (ref 5.0–8.0)

## 2017-08-24 LAB — CBC
HCT: 45.1 % (ref 36.0–46.0)
Hemoglobin: 15.1 g/dL — ABNORMAL HIGH (ref 12.0–15.0)
MCH: 31.4 pg (ref 26.0–34.0)
MCHC: 33.5 g/dL (ref 30.0–36.0)
MCV: 93.8 fL (ref 78.0–100.0)
PLATELETS: 310 10*3/uL (ref 150–400)
RBC: 4.81 MIL/uL (ref 3.87–5.11)
RDW: 12.8 % (ref 11.5–15.5)
WBC: 10.4 10*3/uL (ref 4.0–10.5)

## 2017-08-24 LAB — COMPREHENSIVE METABOLIC PANEL
ALT: 20 U/L (ref 0–44)
ANION GAP: 12 (ref 5–15)
AST: 25 U/L (ref 15–41)
Albumin: 4.2 g/dL (ref 3.5–5.0)
Alkaline Phosphatase: 63 U/L (ref 38–126)
BILIRUBIN TOTAL: 1.3 mg/dL — AB (ref 0.3–1.2)
BUN: 13 mg/dL (ref 8–23)
CHLORIDE: 103 mmol/L (ref 98–111)
CO2: 25 mmol/L (ref 22–32)
Calcium: 9.9 mg/dL (ref 8.9–10.3)
Creatinine, Ser: 0.66 mg/dL (ref 0.44–1.00)
GFR calc Af Amer: >60 mL/min (ref >60)
Glucose, Bld: 110 mg/dL — ABNORMAL HIGH (ref 70–99)
POTASSIUM: 3.5 mmol/L (ref 3.5–5.1)
Sodium: 140 mmol/L (ref 135–145)
TOTAL PROTEIN: 7.4 g/dL (ref 6.5–8.1)

## 2017-08-24 LAB — RAPID URINE DRUG SCREEN, HOSP PERFORMED
Amphetamines: NOT DETECTED
BENZODIAZEPINES: NOT DETECTED
Barbiturates: NOT DETECTED
COCAINE: NOT DETECTED
OPIATES: NOT DETECTED
Tetrahydrocannabinol: NOT DETECTED

## 2017-08-24 LAB — DIFFERENTIAL
Abs Immature Granulocytes: 0 10*3/uL (ref 0.0–0.1)
BASOS PCT: 1 %
Basophils Absolute: 0.1 10*3/uL (ref 0.0–0.1)
EOS ABS: 0.1 10*3/uL (ref 0.0–0.7)
EOS PCT: 1 %
IMMATURE GRANULOCYTES: 0 %
Lymphocytes Relative: 32 %
Lymphs Abs: 3.3 10*3/uL (ref 0.7–4.0)
MONOS PCT: 9 %
Monocytes Absolute: 0.9 10*3/uL (ref 0.1–1.0)
NEUTROS PCT: 57 %
Neutro Abs: 5.9 10*3/uL (ref 1.7–7.7)

## 2017-08-24 LAB — I-STAT TROPONIN, ED: Troponin i, poc: 0.01 ng/mL (ref 0.00–0.08)

## 2017-08-24 LAB — I-STAT CHEM 8, ED
BUN: 14 mg/dL (ref 8–23)
CHLORIDE: 100 mmol/L (ref 98–111)
Calcium, Ion: 1.13 mmol/L — ABNORMAL LOW (ref 1.15–1.40)
Creatinine, Ser: 0.5 mg/dL (ref 0.44–1.00)
Glucose, Bld: 106 mg/dL — ABNORMAL HIGH (ref 70–99)
HEMATOCRIT: 45 % (ref 36.0–46.0)
Hemoglobin: 15.3 g/dL — ABNORMAL HIGH (ref 12.0–15.0)
POTASSIUM: 3.4 mmol/L — AB (ref 3.5–5.1)
Sodium: 141 mmol/L (ref 135–145)
TCO2: 24 mmol/L (ref 22–32)

## 2017-08-24 LAB — PROTIME-INR
INR: 1.19
PROTHROMBIN TIME: 15 s (ref 11.4–15.2)

## 2017-08-24 LAB — ETHANOL

## 2017-08-24 LAB — CBG MONITORING, ED: GLUCOSE-CAPILLARY: 120 mg/dL — AB (ref 70–99)

## 2017-08-24 LAB — APTT: aPTT: 34 seconds (ref 24–36)

## 2017-08-24 MED ORDER — ALPRAZOLAM 0.5 MG PO TABS: 0.5000 mg | ORAL_TABLET | Freq: Three times a day (TID) | ORAL | 0 refills | Status: AC | PRN

## 2017-08-24 MED ORDER — LORAZEPAM 2 MG/ML IJ SOLN
1.0000 mg | Freq: Once | INTRAMUSCULAR | Status: AC
  Administered 2017-08-24: 1 mg via INTRAMUSCULAR
  Filled 2017-08-24: qty 1

## 2017-08-24 NOTE — ED Notes (Addendum)
Patient transported to MRI 

## 2017-08-24 NOTE — ED Provider Notes (Signed)
[facility] EMERGENCY DEPARTMENT Provider Note   CSN: [id number] Arrival date & time: 08/24/17  1444     History   Chief Complaint Chief Complaint  Patient presents with  . Aphasia    HPI Ebony Williams is a 67 y.o. female hx of HTN, previous stroke with residual R sided weakness, recent seizure, here presenting with trouble speaking.  She has some feels her health care and is DNR.  Patient apparently had an episode she had trouble speaking.  Was unclear when the onset was.  She states that she has history of TIAs.  The EMS personnel was unable to assess when the time of onset was.  Apparently there was no seizure-like activity observed today. Per the family who visited the patient, she is more altered than usual.   The history is provided by the nursing home and the EMS personnel.    Past Medical History:  Diagnosis Date  . History of kidney stones    x1  . Hypertension     Patient Active Problem List   Diagnosis Date Noted  . Malnutrition of moderate degree 08/06/2017  . DNR (do not resuscitate) discussion   . Seizure ([place])   . Palliative care by specialist   . Goals of care, counseling/discussion   . Altered mental status 08/01/2017  . Right arm weakness 06/16/2017  . Aphasia 06/16/2017  . Spastic hemiplegia of right dominant side as late effect of cerebral infarction ([place])   . Hyponatremia   . Labile blood pressure   . Hypokalemia   . Elevated BUN   . Lethargy   . Transaminitis   . Essential hypertension   . Right hemiplegia ([place])   . Hypertensive crisis   . 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 Depression   . Dysphagia, post-stroke   . Diastolic dysfunction   . Benign essential HTN   . Prediabetes   . Leukocytosis   . 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 Stroke (cerebrum) ([place]) 04/06/2017  . 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 Pain in joint, shoulder region  10/18/2011    Past Surgical History:  Procedure Laterality Date  . CHOLECYSTECTOMY     laparoscopic  . COLONOSCOPY WITH PROPOFOL N/A 01/12/2015   Procedure: COLONOSCOPY WITH PROPOFOL;  Surgeon: Garlan Fair, MD;  Location: [facility];  Service: Endoscopy;  Laterality: N/A;  . TUBAL LIGATION       OB History   None      Home Medications    Prior to Admission medications   Medication Sig Start Date End Date Taking? Authorizing Provider  acetaminophen (TYLENOL) 325 MG tablet Take 2 tablets (650 mg total) by mouth every 6 (six) hours as needed for mild pain, fever or headache. 08/07/17  Yes Barton Dubois, MD  ALPRAZolam Duanne Moron) 0.25 MG tablet Take 1 tablet (0.25 mg total) by mouth at bedtime as needed for anxiety or sleep. Patient taking differently: Take 0.25 mg by mouth every 8 (eight) hours as needed for anxiety. For 14 days 08/07/17  Yes Barton Dubois, MD  amantadine (SYMMETREL) 100 MG capsule Take 2 capsules (200 mg total) by mouth 2 (two) times daily. 05/08/17  Yes Angiulli, Lavon Paganini, PA-C  amLODipine (NORVASC) 10 MG tablet Take 1 tablet (10 mg total) by mouth daily. 08/07/17  Yes Barton Dubois, MD  aspirin 81 MG chewable tablet Chew 1 tablet (81 mg total) by mouth daily. 05/08/17  Yes Angiulli, Lavon Paganini, PA-C  atorvastatin (LIPITOR) 40 MG tablet Take 1 tablet (40 mg total) by mouth daily. 06/22/17  Yes Patrecia Pour, MD  clopidogrel (PLAVIX) 75 MG tablet Take 1 tablet (75 mg total) by mouth daily. 05/08/17  Yes Angiulli, Lavon Paganini, PA-C  FLUoxetine (PROZAC) 40 MG capsule Take 1 capsule (40 mg total) by mouth at bedtime. 06/22/17  Yes Patrecia Pour, MD  levETIRAcetam (KEPPRA) 100 MG/ML solution Take 1,500 mg by mouth 2 (two) times daily.   Yes [provider]  lisinopril (PRINIVIL,ZESTRIL) 5 MG tablet Take 5 mg by mouth daily.   Yes [provider]  metFORMIN (GLUCOPHAGE) 500 MG tablet Take 1 tablet (500 mg total) by mouth daily with breakfast. 05/08/17  Yes Angiulli,  Lavon Paganini, PA-C  nystatin (NYSTATIN) powder Apply topically See admin instructions. Apply topically to groin and outer vaginal area twice daily prn for rash/irritation   Yes [provider]  ondansetron (ZOFRAN) 4 MG tablet Take 4 mg by mouth every 8 (eight) hours as needed for nausea or vomiting.   Yes [provider]  pantoprazole (PROTONIX) 40 MG tablet Take 1 tablet (40 mg total) by mouth daily. 05/07/17  Yes Angiulli, Lavon Paganini, PA-C  polyethylene glycol (MIRALAX / GLYCOLAX) packet Take 17 g by mouth daily. Mix with 8-10 oz water and drink   Yes [provider]  potassium chloride SA (K-DUR,KLOR-CON) 20 MEQ tablet Take 20 mEq by mouth daily.   Yes [provider]  traMADol (ULTRAM) 50 MG tablet Take 1 tablet (50 mg total) by mouth every 8 (eight) hours as needed for severe pain. Patient taking differently: Take 50 mg by mouth every 12 (twelve) hours.  08/07/17  Yes Barton Dubois, MD  feeding supplement, GLUCERNA SHAKE, (GLUCERNA SHAKE) LIQD Take 237 mLs by mouth 3 (three) times daily between meals. Patient not taking: Reported on 08/24/2017 08/07/17   Barton Dubois, MD  levETIRAcetam (KEPPRA) 750 MG tablet Take 2 tablets (1,500 mg total) by mouth 2 (two) times daily. Patient not taking: Reported on 08/24/2017 08/07/17   Barton Dubois, MD    Family History Family History  Problem Relation Age of Onset  . Diabetes Mother   . Hypertension Mother     Social History Social History   Tobacco Use  . Smoking status: Never Smoker  . Smokeless tobacco: Never Used  Substance Use Topics  . Alcohol use: Yes    Comment: very rare  . Drug use: No     Allergies   Sulfa antibiotics   Review of Systems Review of Systems  Neurological: Positive for speech difficulty.  All other systems reviewed and are negative.    Physical Exam Updated Vital Signs BP 126/77   Pulse (!) 102   Temp 98.6 F (37 C)   Resp (!) 21   SpO2 99%   Physical Exam    Constitutional:  Chronically ill   HENT:  Head: Normocephalic.  Mouth/Throat: Oropharynx is clear and moist.  Eyes: Pupils are equal, round, and reactive to light. Conjunctivae and EOM are normal.  Neck: Normal range of motion. Neck supple.  Cardiovascular: Normal rate, regular rhythm and normal heart sounds.  Pulmonary/Chest: Effort normal and breath sounds normal. No stridor. No respiratory distress. She has no wheezes.  Abdominal: Soft. Bowel sounds are normal. She exhibits no distension. There is no tenderness.  Musculoskeletal: Normal range of motion.  Neurological:  Contracted on R side (chronic), strength 4/5 L side. Some slurred speech  which was documented in previous charts   Skin: Skin is warm.  Psychiatric: She has a normal mood and affect.  Nursing note and vitals reviewed.    ED Treatments / Results  Labs (all labs ordered are listed, but only abnormal results are displayed) Labs Reviewed  CBC - Abnormal; Notable for the following components:      Result Value   Hemoglobin 15.1 (*)    All other components within normal limits  COMPREHENSIVE METABOLIC PANEL - Abnormal; Notable for the following components:   Glucose, Bld 110 (*)    Total Bilirubin 1.3 (*)    All other components within normal limits  URINALYSIS, ROUTINE W REFLEX MICROSCOPIC - Abnormal; Notable for the following components:   Ketones, ur 20 (*)    Protein, ur 30 (*)    Bacteria, UA MANY (*)    All other components within normal limits  I-STAT CHEM 8, ED - Abnormal; Notable for the following components:   Potassium 3.4 (*)    Glucose, Bld 106 (*)    Calcium, Ion 1.13 (*)    Hemoglobin 15.3 (*)    All other components within normal limits  URINE CULTURE  ETHANOL  PROTIME-INR  APTT  DIFFERENTIAL  RAPID URINE DRUG SCREEN, HOSP PERFORMED  I-STAT TROPONIN, ED    EKG EKG Interpretation  Date/Time:  Friday August 24 2017 15:34:29 EDT Ventricular Rate:  100 PR Interval:    QRS  Duration: 83 QT Interval:  343 QTC Calculation: 443 R Axis:   -34 Text Interpretation:  Sinus tachycardia Left axis deviation Abnormal R-wave progression, late transition Nonspecific repolarization abnormalities No significant change since last tracing Confirmed by Wandra Arthurs [phone]) on 08/24/2017 3:38:31 PM   Radiology Dg Chest 2 View  Result Date: 08/24/2017 CLINICAL DATA:  Altered mental status EXAM: CHEST - 2 VIEW COMPARISON:  06/16/2017 FINDINGS: There is mild cardiac enlargement. There is no pleural effusion or edema identified. No airspace opacities identified. The visualized osseous structures are unremarkable. IMPRESSION: 1. No acute cardiopulmonary abnormalities. Electronically Signed   By: Kerby Moors M.D.   On: 08/24/2017 16:16   Ct Head Wo Contrast  Result Date: 08/24/2017 CLINICAL DATA:  67 year old female with slurred speech. EXAM: CT HEAD WITHOUT CONTRAST TECHNIQUE: Contiguous axial images were obtained from the base of the skull through the vertex without intravenous contrast. COMPARISON:  Head CT without contrast 08/01/2017. Brain MRI 06/16/2017, and earlier. FINDINGS: Brain: Stable appearance of developing encephalomalacia related to the left basal ganglia and left lateral PCA/MCA watershed territory infarcts earlier this year. No associated mass effect. Superimposed chronic encephalomalacia in the left superior parietal lobe is stable. Patchy and confluent bilateral cerebral white matter and thalamic hypodensity appears stable. Stable posterior fossa gray-white matter differentiation. No midline shift, ventriculomegaly, mass effect, evidence of mass lesion, intracranial hemorrhage or evidence of cortically based acute infarction. Gray-white matter differentiation is within normal limits throughout the brain. Vascular: Calcified atherosclerosis at the skull base. No suspicious intracranial vascular hyperdensity. Skull: Negative. Sinuses/Orbits: Visualized paranasal sinuses and  mastoids are stable and well pneumatized. Other: Visualized orbit soft tissues are within normal limits. Visualized scalp soft tissues are within normal limits. IMPRESSION: Advanced hemispheric and deep gray matter ischemia appears stable by CT since June. No acute intracranial abnormality identified. Electronically Signed   By: Genevie Ann M.D.   On: 08/24/2017 16:05   Mr Brain Wo Contrast  Result Date: 08/24/2017 CLINICAL DATA:  Speech difficulties. History of hypertension and stroke. EXAM: MRI HEAD  WITHOUT CONTRAST TECHNIQUE: Multiplanar, multiecho pulse sequences of the brain and surrounding structures were obtained without intravenous contrast. COMPARISON:  CT HEAD August 24, 2017 and MRI of the head Jun 16, 2017. FINDINGS: Moderate to severely motion degraded examination. Patient could not tolerate further imaging. Sagittal T1 and coronal T2 sequences not obtained. INTRACRANIAL CONTENTS: No reduced diffusion to suggest acute ischemia. Spurs reduced diffusion LEFT basal ganglia and LEFT parietoccipital lobe junction associated with susceptibility artifact and old infarcts. No susceptibility artifact to suggest acute hemorrhage. Old LEFT basal ganglia infarct. Patchy T2 bright signal basal ganglia and thalami seen with chronic small vessel ischemic changes. Moderate parenchymal brain volume loss. Ex vacuo dilatation LEFT lateral ventricle. Diminutive LEFT cerebral peduncle compatible with wallerian degeneration. Patchy to confluent supratentorial and pontine white matter FLAIR T2 hyperintensities. No midline shift or mass effect. No abnormal extra-axial fluid collections. VASCULAR: Major intracranial vascular flow voids present at skull base. SKULL AND UPPER CERVICAL SPINE: Limited assessment. SINUSES/ORBITS: Mild LEFT maxillary sinus mucosal thickening. Nondiagnostic assessment of orbits. OTHER: None. IMPRESSION: 1. Moderate to severely motion degraded incomplete examination. 2. No acute intracranial process. 3.  Old LEFT MCA and posterior border zone infarcts. 4. Moderate to severe chronic small vessel ischemic changes. 5. Moderate parenchymal brain volume loss. Electronically Signed   By: Elon Alas M.D.   On: 08/24/2017 17:54    Procedures Procedures (including critical care time)  Medications Ordered in ED Medications - No data to display   Initial Impression / Assessment and Plan / ED Course  I have reviewed the triage vital signs and the nursing notes.  Pertinent labs & imaging results that were available during my care of the patient were reviewed by me and considered in my medical decision making (see chart for details).    KELSA JAWOROWSKI is a 67 y.o. female here with weakness, possible AMS. Patient has recent stroke and seizure. No seizure activity observed at nursing home or in the ED. Will get labs, UA, CXR, CT head, MRI brain. Will try and reach nursing home to get collateral info.   6:37 PM Social work contacted facility. Family requested transfer. Family not present currently. CT head, MRI brain, and UA and CXR unremarkable. Patient's mental status at baseline. Stable for dc back to facility   Final Clinical Impressions(s) / ED Diagnoses   Final diagnoses:  None    ED Discharge Orders    None       Drenda Freeze, MD 08/24/17 1840

## 2017-08-24 NOTE — Discharge Instructions (Addendum)
Continue your current meds. You have previous stroke and sometimes can become more weak or altered. You don't have another stroke currently   Increase xanax to 0.5 mg three times daily as needed for anxiety   See your doctor  Return to ER if you have worse trouble speaking, weakness, numbness

## 2017-08-24 NOTE — ED Notes (Signed)
Husband concerned over pt going back to nursing home in current state.  Pt remains extremely anxious and fidgety.  Dr. Darl Householder to bedside to speak with husband.

## 2017-08-24 NOTE — ED Notes (Signed)
Patient transported to CT 

## 2017-08-24 NOTE — ED Notes (Signed)
Pt moved to hallway bed in direct view of nursing station for safety.  Pt obviously upset, anxious, and crying.  Husband arrived while pt being moved to hallway.

## 2017-08-24 NOTE — ED Triage Notes (Signed)
Per EMS: Pt from [facility] with c/o slurred speech.  No reported LKW.  Pt A/O to baseline with a HX of cerebral infarction and tremors.

## 2017-08-24 NOTE — Progress Notes (Signed)
CSW called [facility] to clarify reason for pt's visit. Per April, nurse at [facility], pt sent to ED for evaluation at the request of family members. Family members requested visit to the ED due to pt not being at baseline and showing confusion.   Wendelyn Breslow, [facility]  [phone]

## 2017-08-26 LAB — URINE CULTURE: Culture: >=100000 — AB

## 2017-08-27 ENCOUNTER — Telehealth: Payer: Self-pay | Admitting: Emergency Medicine

## 2017-08-27 ENCOUNTER — Telehealth: Payer: Self-pay | Admitting: Adult Health

## 2017-08-27 NOTE — Telephone Encounter (Signed)
Pts daughter Morey Hummingbird called stating the pt had been seen in the ED while there she had an MRI and CT scan. Morey Hummingbird would like a call to touch base on the results. Please call to advise

## 2017-08-27 NOTE — Progress Notes (Signed)
ED Antimicrobial Stewardship Positive Culture Follow Up   Ebony Williams is an 67 y.o. female who presented to [facility] on 08/24/2017 with a chief complaint of  Chief Complaint  Patient presents with  . Aphasia    Recent Results (from the past 720 hour(s))  MRSA PCR Screening     Status: None   Collection Time: 08/01/17 11:33 PM  Result Value Ref Range Status   MRSA by PCR NEGATIVE NEGATIVE Final    Comment:        The GeneXpert MRSA Assay (FDA approved for NASAL specimens only), is one component of a comprehensive MRSA colonization surveillance program. It is not intended to diagnose MRSA infection nor to guide or monitor treatment for MRSA infections. Performed at [facility], [address], [place], [place] [address]   Urine culture     Status: Abnormal   Collection Time: 08/24/17  4:36 PM  Result Value Ref Range Status   Specimen Description URINE, CATHETERIZED  Final   Special Requests   Final    NONE Performed at [facility], [place] [address] [place], [place] [address]    Culture >=100,000 COLONIES/mL KLEBSIELLA PNEUMONIAE (A)  Final   Report Status 08/26/2017 FINAL  Final   Organism ID, Bacteria KLEBSIELLA PNEUMONIAE (A)  Final      Susceptibility   Klebsiella pneumoniae - MIC*    AMPICILLIN RESISTANT Resistant     CEFAZOLIN <=4 SENSITIVE Sensitive     CEFTRIAXONE <=1 SENSITIVE Sensitive     CIPROFLOXACIN 0.5 SENSITIVE Sensitive     GENTAMICIN <=1 SENSITIVE Sensitive     IMIPENEM <=0.25 SENSITIVE Sensitive     NITROFURANTOIN 128 RESISTANT Resistant     TRIMETH/SULFA <=20 SENSITIVE Sensitive     AMPICILLIN/SULBACTAM 4 SENSITIVE Sensitive     PIP/TAZO <=4 SENSITIVE Sensitive     Extended ESBL NEGATIVE Sensitive     * >=100,000 COLONIES/mL KLEBSIELLA PNEUMONIAE    []  Treated with N/A, organism resistant to prescribed antimicrobial [x]  Patient discharged originally without antimicrobial agent and treatment is now indicated  New antibiotic  prescription: If pt with UTI symptoms, start cephalexin 500mg  PO BID x 7 days  ED Provider: Suella Broad, PA   Ailana Cuadrado, Rande Lawman 08/27/2017, 9:10 AM Clinical Pharmacist Monday - Friday phone -  [phone] Saturday - Sunday phone - [phone]

## 2017-08-27 NOTE — Telephone Encounter (Addendum)
Rn call Ebony Williams pts daughter on dpr. Ebony Williams stated patient was in ED on 08/24/2017 for aphasia,and had CT, and MRI. RN stated per Jacob Moores NP the scan reports results were given to pts husband. Ebony Williams stated her father could not relay the results to her correctly. Ebony Williams tried calling hospital for results. Ebony Williams stated she was advised to [facility].about the results. Rn stated the results are given to the pt before they leave the hospital,and pt was not seen by stroke team. Rn stated per MD discharge note CT,and MRI was unremarkable. Rn ask if patient has depression,and anxiety. Ebony Williams stated her mom does take xanax for anxiety, but it does not help her sometimes. Ebony Williams wanted to know if pt need to be seen by Janett Billow NP. RN stated per Janett Billow Np she look at chart, and pt needs to be seen by her PCP, or medical provider at [facility] facility. Per the report it was not a new stroke. RN stated pt has appt in 10/2017 with Janett Billow NP. Ebony Williams stated her mom is still having issues anxious,and crying. RN advised her to seek PCP for evaluation of this.Ebony Williams verbalized understanding.

## 2017-08-27 NOTE — Telephone Encounter (Signed)
Revised. 

## 2017-08-29 ENCOUNTER — Other Ambulatory Visit: Payer: Self-pay

## 2017-08-29 ENCOUNTER — Encounter: Payer: Medicare Other | Attending: Physical Medicine & Rehabilitation | Admitting: Physical Medicine & Rehabilitation

## 2017-08-29 ENCOUNTER — Ambulatory Visit: Payer: Medicare Other | Admitting: Physical Medicine & Rehabilitation

## 2017-08-29 ENCOUNTER — Encounter: Payer: Self-pay | Admitting: Physical Medicine & Rehabilitation

## 2017-08-29 VITALS — BP 172/83 | HR 90 | Ht 62.0 in | Wt 149.0 lb

## 2017-08-29 DIAGNOSIS — Z9889 Other specified postprocedural states: Secondary | ICD-10-CM | POA: Insufficient documentation

## 2017-08-29 DIAGNOSIS — I69320 Aphasia following cerebral infarction: Secondary | ICD-10-CM | POA: Insufficient documentation

## 2017-08-29 DIAGNOSIS — Z9049 Acquired absence of other specified parts of digestive tract: Secondary | ICD-10-CM | POA: Insufficient documentation

## 2017-08-29 DIAGNOSIS — F329 Major depressive disorder, single episode, unspecified: Secondary | ICD-10-CM | POA: Diagnosis not present

## 2017-08-29 DIAGNOSIS — Z87442 Personal history of urinary calculi: Secondary | ICD-10-CM | POA: Insufficient documentation

## 2017-08-29 DIAGNOSIS — I10 Essential (primary) hypertension: Secondary | ICD-10-CM | POA: Diagnosis not present

## 2017-08-29 DIAGNOSIS — I69351 Hemiplegia and hemiparesis following cerebral infarction affecting right dominant side: Secondary | ICD-10-CM | POA: Diagnosis not present

## 2017-08-29 DIAGNOSIS — Z8249 Family history of ischemic heart disease and other diseases of the circulatory system: Secondary | ICD-10-CM | POA: Insufficient documentation

## 2017-08-29 DIAGNOSIS — R269 Unspecified abnormalities of gait and mobility: Secondary | ICD-10-CM | POA: Insufficient documentation

## 2017-08-29 DIAGNOSIS — Z09 Encounter for follow-up examination after completed treatment for conditions other than malignant neoplasm: Secondary | ICD-10-CM | POA: Insufficient documentation

## 2017-08-29 DIAGNOSIS — R5383 Other fatigue: Secondary | ICD-10-CM | POA: Insufficient documentation

## 2017-08-29 DIAGNOSIS — Z833 Family history of diabetes mellitus: Secondary | ICD-10-CM | POA: Insufficient documentation

## 2017-08-29 DIAGNOSIS — I69398 Other sequelae of cerebral infarction: Secondary | ICD-10-CM | POA: Insufficient documentation

## 2017-08-29 DIAGNOSIS — G8191 Hemiplegia, unspecified affecting right dominant side: Secondary | ICD-10-CM

## 2017-08-29 NOTE — Progress Notes (Addendum)
Subjective:    Patient ID: Ebony Williams, female    DOB: 1950-11-12, 67 y.o.   MRN: [id number]  HPI 67 year old right-handed female with a history of hypertension presents for hospital follow up for left basal ganglia infarction.   Last clinic visit on 07/13/17.  Daughter provides majority of history. Since that time, she has been to the ED twice, for seizures and suspected UTI, notes reviewed. MRI reviewed, limited, but unchanged.  She does not note side effects with Botox injection. Denies falls.  She is extremely anxious and tearful. She is still at the SNF.  BP is elevated. She has not been wear PRAFO. She has not seen Neurology again yet. BP is elevated. She had a falls trying to get out of wheelchair when she had UTI. She is getting therapies at SNF. Comminicated with physician at SNF regarding side effects with Baclofen.  Her depression meds were increased.  Pain Inventory Average Pain 5 Pain Right Now 0 My pain is intermittent, sharp, burning and stabbing  In the last 24 hours, has pain interfered with the following? General activity 7 Relation with others 7 Enjoyment of life 7 What TIME of day is your pain at its worst? evening Sleep (in general) Fair  Pain is worse with: bending, sitting, standing and some activites Pain improves with: rest, therapy/exercise and medication Relief from Meds: 2  Mobility use a walker use a wheelchair needs help with transfers  Function retired I need assistance with the following:  dressing, bathing, toileting, meal prep, household duties and shopping  Neuro/Psych bladder control problems bowel control problems weakness tremor trouble walking dizziness confusion depression anxiety  Prior Studies Any changes since last visit?  no  Physicians involved in your care Any changes since last visit?  no   Family History  Problem Relation Age of Onset  . Diabetes Mother   . Hypertension Mother    Social History    Socioeconomic History  . Marital status: Married    Spouse name: Not on file  . Number of children: Not on file  . Years of education: Not on file  . Highest education level: Not on file  Occupational History  . Not on file  Social Needs  . Financial resource strain: Not on file  . Food insecurity:    Worry: Not on file    Inability: Not on file  . Transportation needs:    Medical: Not on file    Non-medical: Not on file  Tobacco Use  . Smoking status: Never Smoker  . Smokeless tobacco: Never Used  Substance and Sexual Activity  . Alcohol use: Yes    Comment: very rare  . Drug use: No  . Sexual activity: Not on file  Lifestyle  . Physical activity:    Days per week: Not on file    Minutes per session: Not on file  . Stress: Not on file  Relationships  . Social connections:    Talks on phone: Not on file    Gets together: Not on file    Attends religious service: Not on file    Active member of club or organization: Not on file    Attends meetings of clubs or organizations: Not on file    Relationship status: Not on file  Other Topics Concern  . Not on file  Social History Narrative  . Not on file   Past Surgical History:  Procedure Laterality Date  . CHOLECYSTECTOMY     laparoscopic  .  COLONOSCOPY WITH PROPOFOL N/A 01/12/2015   Procedure: COLONOSCOPY WITH PROPOFOL;  Surgeon: Garlan Fair, MD;  Location: [facility];  Service: Endoscopy;  Laterality: N/A;  . TUBAL LIGATION     Past Medical History:  Diagnosis Date  . History of kidney stones    x1  . Hypertension    BP (!) 172/83   Pulse 90   Ht 5\' 2"  (1.575 m) Comment: pt reported, in wheelchair  Wt 149 lb (67.6 kg) Comment: pt reported, in wheelchair  SpO2 96%   BMI 27.25 kg/m   Opioid Risk Score:   Fall Risk Score:  `1  Depression screen PHQ 2/9  Depression screen [facility] 2/9 08/29/2017 07/13/2017  Decreased Interest 1 3  Down, Depressed, Hopeless 1 3  PHQ - 2 Score 2 6     Review of  Systems  Constitutional: Negative.   HENT: Negative.   Eyes: Negative.   Respiratory: Negative.   Cardiovascular: Negative.   Gastrointestinal: Positive for nausea.  Endocrine: Negative.   Genitourinary: Negative.   Musculoskeletal: Positive for gait problem.       Spasms  Skin: Negative.   Allergic/Immunologic: Negative.   Neurological: Positive for dizziness, tremors and weakness.  Hematological: Bruises/bleeds easily.  Psychiatric/Behavioral: Positive for confusion and dysphoric mood. The patient is nervous/anxious.   All other systems reviewed and are negative.     Objective:   Physical Exam Constitutional: No distress . Vital signs reviewed. HENT: Normocephalic, atraumatic. Eyes: EOMI, No discharge. Cardiovascular: RRR. No JVD    Respiratory: CTA Bilaterally. Normal effort    GI: BS +, non-distended  Musculoskeletal: Pain with ROM in RUE/RLE. Neurological: Alert and oriented. Dysphonia/Dysarthria Motor: LUE 4/5 proximal to distal LLE: 4/5 hip flexion, 4+/5 knee extension, 4+/5 ADF RUE: 0/5 proximal to distal RLE: 2-/5 HF, distally 0/5 proximal to distal MAS: RUE: elbow flex 3/4, wrist flex 3/4, finger flexor 3/4 RLE: APF 3/4 Skin: Skin is warm and dry.  Psychiatric: Flat.    Assessment & Plan:  67 year old right-handed female with a history of hypertension presents for follow up for left basal ganglia infarction.   1. Right side weakness with aphasia secondary to multifocal ischemic left MCA and PCA infarcts on 04/06/17  Recent MRI reviewed, limited, but stable  Labs reviewed  Cont therapies at SNF  Cont bracing, encouraged use of WHO and PRAFO  Follow up with Neurology, attempting to make a sooner appointment  2. Hypertension.  Encouraged gradual postural changes  BP elevated, pt and daughter state even higher normally, encouraged follow up for further adjustments   3. Gait abnormality  Cont therapies  Cont wheelchair  4. Spasticity late effect of  CVA:  Unable to tolerate Baclofen  Cont bracing  Cont therapies  Last Botox, will change to Dysport    Right Biceps 400 units    Right FCR 200 units    Right FCU 100 units    Right FDS 200 units    Right FDP 100 units        Right Gastroc Med 150 units    Right Gastroc Lat 150 units   Trial Tizanidine 2mg  daily, may increase if no side effects  5. Depression  Cont Fluoxetine, may consider changing SSRI/SNRI as per report, pt crying 80% of time.  May consider Cymbalta for additional benefits.  Pt now amenable to psychology, recommend follow up

## 2017-09-01 ENCOUNTER — Encounter (HOSPITAL_COMMUNITY): Payer: Self-pay

## 2017-09-01 ENCOUNTER — Other Ambulatory Visit: Payer: Self-pay

## 2017-09-01 ENCOUNTER — Inpatient Hospital Stay (HOSPITAL_COMMUNITY)
Admission: EM | Admit: 2017-09-01 | Discharge: 2017-10-07 | DRG: 056 | Disposition: E | Payer: Medicare Other | Attending: Pulmonary Disease | Admitting: Pulmonary Disease

## 2017-09-01 ENCOUNTER — Emergency Department (HOSPITAL_COMMUNITY): Payer: Medicare Other

## 2017-09-01 DIAGNOSIS — Z66 Do not resuscitate: Secondary | ICD-10-CM | POA: Diagnosis present

## 2017-09-01 DIAGNOSIS — J9600 Acute respiratory failure, unspecified whether with hypoxia or hypercapnia: Secondary | ICD-10-CM | POA: Diagnosis not present

## 2017-09-01 DIAGNOSIS — G9341 Metabolic encephalopathy: Secondary | ICD-10-CM | POA: Diagnosis present

## 2017-09-01 DIAGNOSIS — E876 Hypokalemia: Secondary | ICD-10-CM | POA: Diagnosis present

## 2017-09-01 DIAGNOSIS — B961 Klebsiella pneumoniae [K. pneumoniae] as the cause of diseases classified elsewhere: Secondary | ICD-10-CM | POA: Diagnosis present

## 2017-09-01 DIAGNOSIS — Z01818 Encounter for other preprocedural examination: Secondary | ICD-10-CM

## 2017-09-01 DIAGNOSIS — Z993 Dependence on wheelchair: Secondary | ICD-10-CM

## 2017-09-01 DIAGNOSIS — R1310 Dysphagia, unspecified: Secondary | ICD-10-CM | POA: Diagnosis present

## 2017-09-01 DIAGNOSIS — I69391 Dysphagia following cerebral infarction: Secondary | ICD-10-CM | POA: Diagnosis not present

## 2017-09-01 DIAGNOSIS — B9689 Other specified bacterial agents as the cause of diseases classified elsewhere: Secondary | ICD-10-CM | POA: Diagnosis present

## 2017-09-01 DIAGNOSIS — Z6828 Body mass index (BMI) 28.0-28.9, adult: Secondary | ICD-10-CM

## 2017-09-01 DIAGNOSIS — Z7982 Long term (current) use of aspirin: Secondary | ICD-10-CM

## 2017-09-01 DIAGNOSIS — I69328 Other speech and language deficits following cerebral infarction: Secondary | ICD-10-CM | POA: Diagnosis not present

## 2017-09-01 DIAGNOSIS — I69351 Hemiplegia and hemiparesis following cerebral infarction affecting right dominant side: Secondary | ICD-10-CM

## 2017-09-01 DIAGNOSIS — E440 Moderate protein-calorie malnutrition: Secondary | ICD-10-CM | POA: Diagnosis present

## 2017-09-01 DIAGNOSIS — Z833 Family history of diabetes mellitus: Secondary | ICD-10-CM

## 2017-09-01 DIAGNOSIS — J690 Pneumonitis due to inhalation of food and vomit: Secondary | ICD-10-CM

## 2017-09-01 DIAGNOSIS — G40901 Epilepsy, unspecified, not intractable, with status epilepticus: Secondary | ICD-10-CM | POA: Diagnosis present

## 2017-09-01 DIAGNOSIS — E785 Hyperlipidemia, unspecified: Secondary | ICD-10-CM | POA: Diagnosis present

## 2017-09-01 DIAGNOSIS — R4182 Altered mental status, unspecified: Secondary | ICD-10-CM | POA: Diagnosis present

## 2017-09-01 DIAGNOSIS — R569 Unspecified convulsions: Secondary | ICD-10-CM

## 2017-09-01 DIAGNOSIS — R29810 Facial weakness: Secondary | ICD-10-CM | POA: Diagnosis present

## 2017-09-01 DIAGNOSIS — Z9911 Dependence on respirator [ventilator] status: Secondary | ICD-10-CM | POA: Diagnosis not present

## 2017-09-01 DIAGNOSIS — Z9049 Acquired absence of other specified parts of digestive tract: Secondary | ICD-10-CM | POA: Diagnosis not present

## 2017-09-01 DIAGNOSIS — I5189 Other ill-defined heart diseases: Secondary | ICD-10-CM | POA: Diagnosis not present

## 2017-09-01 DIAGNOSIS — Z4659 Encounter for fitting and adjustment of other gastrointestinal appliance and device: Secondary | ICD-10-CM

## 2017-09-01 DIAGNOSIS — Z882 Allergy status to sulfonamides status: Secondary | ICD-10-CM

## 2017-09-01 DIAGNOSIS — Z79899 Other long term (current) drug therapy: Secondary | ICD-10-CM

## 2017-09-01 DIAGNOSIS — I63512 Cerebral infarction due to unspecified occlusion or stenosis of left middle cerebral artery: Secondary | ICD-10-CM | POA: Diagnosis present

## 2017-09-01 DIAGNOSIS — Z8673 Personal history of transient ischemic attack (TIA), and cerebral infarction without residual deficits: Secondary | ICD-10-CM

## 2017-09-01 DIAGNOSIS — J9690 Respiratory failure, unspecified, unspecified whether with hypoxia or hypercapnia: Secondary | ICD-10-CM | POA: Diagnosis not present

## 2017-09-01 DIAGNOSIS — Z87442 Personal history of urinary calculi: Secondary | ICD-10-CM

## 2017-09-01 DIAGNOSIS — Z515 Encounter for palliative care: Secondary | ICD-10-CM | POA: Diagnosis present

## 2017-09-01 DIAGNOSIS — Z7902 Long term (current) use of antithrombotics/antiplatelets: Secondary | ICD-10-CM

## 2017-09-01 DIAGNOSIS — R9401 Abnormal electroencephalogram [EEG]: Secondary | ICD-10-CM | POA: Diagnosis present

## 2017-09-01 DIAGNOSIS — I10 Essential (primary) hypertension: Secondary | ICD-10-CM | POA: Diagnosis present

## 2017-09-01 DIAGNOSIS — I69320 Aphasia following cerebral infarction: Secondary | ICD-10-CM

## 2017-09-01 DIAGNOSIS — I69398 Other sequelae of cerebral infarction: Secondary | ICD-10-CM | POA: Diagnosis not present

## 2017-09-01 DIAGNOSIS — N390 Urinary tract infection, site not specified: Secondary | ICD-10-CM | POA: Diagnosis present

## 2017-09-01 DIAGNOSIS — E1165 Type 2 diabetes mellitus with hyperglycemia: Secondary | ICD-10-CM | POA: Diagnosis present

## 2017-09-01 DIAGNOSIS — Z7984 Long term (current) use of oral hypoglycemic drugs: Secondary | ICD-10-CM

## 2017-09-01 DIAGNOSIS — Z7189 Other specified counseling: Secondary | ICD-10-CM | POA: Diagnosis not present

## 2017-09-01 DIAGNOSIS — Z8249 Family history of ischemic heart disease and other diseases of the circulatory system: Secondary | ICD-10-CM

## 2017-09-01 LAB — CBC
HCT: 43.9 % (ref 36.0–46.0)
Hemoglobin: 14.8 g/dL (ref 12.0–15.0)
MCH: 31.6 pg (ref 26.0–34.0)
MCHC: 33.7 g/dL (ref 30.0–36.0)
MCV: 93.6 fL (ref 78.0–100.0)
Platelets: 269 10*3/uL (ref 150–400)
RBC: 4.69 MIL/uL (ref 3.87–5.11)
RDW: 12.8 % (ref 11.5–15.5)
WBC: 8.2 10*3/uL (ref 4.0–10.5)

## 2017-09-01 LAB — URINALYSIS, ROUTINE W REFLEX MICROSCOPIC
BACTERIA UA: NONE SEEN
Bilirubin Urine: NEGATIVE
Glucose, UA: NEGATIVE mg/dL
Hgb urine dipstick: NEGATIVE
Ketones, ur: 80 mg/dL — AB
Leukocytes, UA: NEGATIVE
NITRITE: NEGATIVE
Protein, ur: 30 mg/dL — AB
Specific Gravity, Urine: 1.015 (ref 1.005–1.030)
pH: 5 (ref 5.0–8.0)

## 2017-09-01 LAB — COMPREHENSIVE METABOLIC PANEL
ALT: 19 U/L (ref 0–44)
ANION GAP: 12 (ref 5–15)
AST: 25 U/L (ref 15–41)
Albumin: 3.6 g/dL (ref 3.5–5.0)
Alkaline Phosphatase: 52 U/L (ref 38–126)
BUN: 15 mg/dL (ref 8–23)
CALCIUM: 9 mg/dL (ref 8.9–10.3)
CHLORIDE: 107 mmol/L (ref 98–111)
CO2: 22 mmol/L (ref 22–32)
Creatinine, Ser: 0.49 mg/dL (ref 0.44–1.00)
GFR calc Af Amer: >60 mL/min (ref >60)
GFR calc non Af Amer: >60 mL/min (ref >60)
Glucose, Bld: 104 mg/dL — ABNORMAL HIGH (ref 70–99)
Potassium: 3.3 mmol/L — ABNORMAL LOW (ref 3.5–5.1)
SODIUM: 141 mmol/L (ref 135–145)
Total Bilirubin: 1.2 mg/dL (ref 0.3–1.2)
Total Protein: 6.4 g/dL — ABNORMAL LOW (ref 6.5–8.1)

## 2017-09-01 LAB — I-STAT CG4 LACTIC ACID, ED
LACTIC ACID, VENOUS: 1.07 mmol/L (ref 0.5–1.9)
Lactic Acid, Venous: 1.23 mmol/L (ref 0.5–1.9)

## 2017-09-01 MED ORDER — LEVETIRACETAM IN NACL 1500 MG/100ML IV SOLN
1500.0000 mg | Freq: Two times a day (BID) | INTRAVENOUS | Status: DC
  Administered 2017-09-01 – 2017-09-11 (×21): 1500 mg via INTRAVENOUS
  Filled 2017-09-01 (×23): qty 100

## 2017-09-01 MED ORDER — SODIUM CHLORIDE 0.9 % IV BOLUS
500.0000 mL | Freq: Once | INTRAVENOUS | Status: AC
  Administered 2017-09-01: 500 mL via INTRAVENOUS

## 2017-09-01 MED ORDER — KCL IN DEXTROSE-NACL 20-5-0.9 MEQ/L-%-% IV SOLN
INTRAVENOUS | Status: AC
  Administered 2017-09-01 – 2017-09-03 (×3): via INTRAVENOUS
  Filled 2017-09-01 (×5): qty 1000

## 2017-09-01 MED ORDER — CEFAZOLIN SODIUM-DEXTROSE 1-4 GM/50ML-% IV SOLN
1.0000 g | Freq: Three times a day (TID) | INTRAVENOUS | Status: DC
  Administered 2017-09-01 – 2017-09-02 (×2): 1 g via INTRAVENOUS
  Filled 2017-09-01 (×3): qty 50

## 2017-09-01 MED ORDER — ONDANSETRON HCL 4 MG PO TABS: 4.0000 mg | ORAL_TABLET | Freq: Four times a day (QID) | ORAL | Status: DC | PRN

## 2017-09-01 MED ORDER — NYSTATIN 100000 UNIT/GM EX POWD: CUTANEOUS | Status: DC

## 2017-09-01 MED ORDER — ONDANSETRON HCL 4 MG/2ML IJ SOLN
4.0000 mg | Freq: Four times a day (QID) | INTRAMUSCULAR | Status: DC | PRN
  Administered 2017-09-04 – 2017-09-05 (×2): 4 mg via INTRAVENOUS
  Filled 2017-09-01 (×2): qty 2

## 2017-09-01 MED ORDER — PHENYTOIN SODIUM 50 MG/ML IJ SOLN
100.0000 mg | Freq: Three times a day (TID) | INTRAMUSCULAR | Status: DC
  Administered 2017-09-02 – 2017-09-08 (×20): 100 mg via INTRAVENOUS
  Filled 2017-09-01 (×20): qty 2

## 2017-09-01 MED ORDER — ACETAMINOPHEN 325 MG PO TABS: 650.0000 mg | ORAL_TABLET | Freq: Four times a day (QID) | ORAL | Status: DC | PRN

## 2017-09-01 MED ORDER — BISACODYL 10 MG RE SUPP
10.0000 mg | Freq: Every day | RECTAL | Status: DC | PRN
  Administered 2017-09-05: 10 mg via RECTAL
  Filled 2017-09-01: qty 1

## 2017-09-01 MED ORDER — SODIUM CHLORIDE 0.9 % IV SOLN
20.0000 mg/kg | Freq: Once | INTRAVENOUS | Status: AC
  Administered 2017-09-01: 1332 mg via INTRAVENOUS
  Filled 2017-09-01: qty 26.64

## 2017-09-01 MED ORDER — ACETAMINOPHEN 650 MG RE SUPP
650.0000 mg | Freq: Four times a day (QID) | RECTAL | Status: DC | PRN
  Administered 2017-09-02 – 2017-09-03 (×3): 650 mg via RECTAL
  Filled 2017-09-01 (×4): qty 1

## 2017-09-01 MED ORDER — NYSTATIN 100000 UNIT/GM EX POWD
Freq: Two times a day (BID) | CUTANEOUS | Status: DC | PRN
Start: 2017-09-01 — End: 2017-09-12
  Filled 2017-09-01: qty 15

## 2017-09-01 MED ORDER — SODIUM CHLORIDE 0.9% FLUSH
3.0000 mL | Freq: Two times a day (BID) | INTRAVENOUS | Status: DC
  Administered 2017-09-01 – 2017-09-11 (×12): 3 mL via INTRAVENOUS

## 2017-09-01 MED ORDER — HEPARIN SODIUM (PORCINE) 5000 UNIT/ML IJ SOLN
5000.0000 [IU] | Freq: Three times a day (TID) | INTRAMUSCULAR | Status: DC
  Administered 2017-09-01 – 2017-09-02 (×2): 5000 [IU] via SUBCUTANEOUS
  Filled 2017-09-01 (×2): qty 1

## 2017-09-01 NOTE — Progress Notes (Signed)
Pt admitted to the unit from ED. Pt alert but nonverbal, don't follow command only stares at staff. Skin clean, dry and intact with no pressure ulcer or opened wound noted except for old bruises to the bilateral legs and hands. Telemetry applied and verified with CCMD; NT called to second verify. purwick placed on pt; call light within reach; bed alarm on; family at bedside; reported off to oncoming RN. Francis Gaines Solymar Grace RN    08/14/2017 1821  Vitals  Temp 98.8 F (37.1 C)  Temp Source Oral  BP (!) 176/83  MAP (mmHg) 109  BP Location Left Arm  BP Method Automatic  Patient Position (if appropriate) Lying  Pulse Rate 86  Pulse Rate Source Dinamap  Resp 20  Oxygen Therapy  SpO2 96 %  O2 Device Room Air  PAINAD (Pain Assessment in Advanced Dementia)  Breathing 0  Negative Vocalization 0  Facial Expression 0  Body Language 0  Consolability 0  PAINAD Score 0  Height and Weight  Weight 66.6 kg (146 lb 13.2 oz)  Type of Scale Used Bed  Type of Weight Actual  BMI (Calculated) 26.85

## 2017-09-01 NOTE — ED Provider Notes (Signed)
[facility] Provider Note   CSN: [id number] Arrival date & time: 08/27/2017  1217     History   Chief Complaint Chief Complaint  Patient presents with  . Altered Mental Status    HPI Ebony Williams is a 67 y.o. female.  Patient is a 67 year old female with a history of a recent large MCA distribution stroke in March of this year with resulting seizures who presents with altered mental status.  She currently resides in a skilled nursing facility.  She was seen here on July 19 for altered mental status.  She was subsequently found to have a UTI that grew out Klebsiella.  She was started on Keflex and is taking about 3-4 doses of the Keflex.  Her family states she has not really gotten any better.  She still having the same altered mental status which they described as increased sedation and less responsiveness.  She is had poor oral intake since that time.  She has been seen by PMR since the recent ED visit and was noted to have increased psychiatric affect such as crying a lot.  She is currently awaiting appointment with a psychiatrist.  She did have a CT scan and MRI on her recent visit which did not show any evidence of new strokes or hemorrhage.  History is limited due to patient's condition.     Past Medical History:  Diagnosis Date  . History of kidney stones    x1  . Hypertension     Patient Active Problem List   Diagnosis Date Noted  . Metabolic encephalopathy 12/03/2534  . UTI due to Klebsiella species 08/31/2017  . Seizure, late effect of stroke ([place]) 08/26/2017  . 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 Malnutrition of moderate degree 08/06/2017  . DNR (do not resuscitate) discussion   . Seizure ([place])   . Palliative care by specialist   . Goals of care, counseling/discussion   . Altered mental status 08/01/2017  . Right arm weakness 06/16/2017  . Aphasia 06/16/2017  . Spastic hemiplegia of right dominant side as late effect of  cerebral infarction ([place])   . Hyponatremia   . Labile blood pressure   . Hypokalemia   . Elevated BUN   . Lethargy   . Transaminitis   . Essential hypertension   . Hypertensive crisis   . 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 Depression   . Dysphagia, post-stroke   . Diastolic dysfunction   . Benign essential HTN   . Prediabetes   . Leukocytosis   . Cerebral infarction due to occlusion of left middle cerebral artery ([place]) s/p IV tPA 04/06/2017  . Stroke (cerebrum) ([place]) 04/06/2017  . 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 Pain in joint, shoulder region 10/18/2011    Past Surgical History:  Procedure Laterality Date  . CHOLECYSTECTOMY     laparoscopic  . COLONOSCOPY WITH PROPOFOL N/A 01/12/2015   Procedure: COLONOSCOPY WITH PROPOFOL;  Surgeon: Garlan Fair, MD;  Location: [facility];  Service: Endoscopy;  Laterality: N/A;  . TUBAL LIGATION       OB History   None      Home Medications    Prior to Admission medications   Medication Sig Start Date End Date Taking? Authorizing Provider  acetaminophen (TYLENOL) 325 MG tablet Take 2 tablets (650 mg total) by mouth every 6 (six) hours as needed for mild pain, fever or headache. Patient taking differently: Take 650 mg by mouth every 6 (six)  hours as needed for fever (general discomfort).  08/07/17  Yes Barton Dubois, MD  ALPRAZolam Duanne Moron) 0.5 MG tablet Take 1 tablet (0.5 mg total) by mouth 3 (three) times daily as needed for anxiety or sleep. Patient taking differently: Take 0.5 mg by mouth every 8 (eight) hours as needed for anxiety or sleep.  08/24/17  Yes Drenda Freeze, MD  amantadine (SYMMETREL) 100 MG capsule Take 2 capsules (200 mg total) by mouth 2 (two) times daily. 05/08/17  Yes Angiulli, Lavon Paganini, PA-C  amLODipine (NORVASC) 10 MG tablet Take 1 tablet (10 mg total) by mouth daily. 08/07/17  Yes Barton Dubois, MD  aspirin 81 MG chewable tablet Chew 1 tablet (81 mg total) by mouth daily. 05/08/17  Yes  Angiulli, Lavon Paganini, PA-C  atorvastatin (LIPITOR) 40 MG tablet Take 1 tablet (40 mg total) by mouth daily. 06/22/17  Yes Patrecia Pour, MD  cephALEXin (KEFLEX) 500 MG capsule Take 500 mg by mouth every 12 (twelve) hours. 7 day course (for UTI) started 08/28/17 pm   Yes [provider]  clopidogrel (PLAVIX) 75 MG tablet Take 1 tablet (75 mg total) by mouth daily. 05/08/17  Yes Angiulli, Lavon Paganini, PA-C  FLUoxetine (PROZAC) 40 MG capsule Take 1 capsule (40 mg total) by mouth at bedtime. 06/22/17  Yes Patrecia Pour, MD  levETIRAcetam (KEPPRA) 100 MG/ML solution Take 1,500 mg by mouth 2 (two) times daily.   Yes [provider]  lisinopril (PRINIVIL,ZESTRIL) 5 MG tablet Take 5 mg by mouth daily.   Yes [provider]  metFORMIN (GLUCOPHAGE) 500 MG tablet Take 1 tablet (500 mg total) by mouth daily with breakfast. 05/08/17  Yes Angiulli, Lavon Paganini, PA-C  nystatin (NYSTATIN) powder Apply topically See admin instructions. Apply topically to groin every 12 hours as needed for rash/irritation   Yes [provider]  ondansetron (ZOFRAN) 4 MG tablet Take 4 mg by mouth every 8 (eight) hours as needed for nausea or vomiting.   Yes [provider]  pantoprazole (PROTONIX) 40 MG tablet Take 1 tablet (40 mg total) by mouth daily. 05/07/17  Yes Angiulli, Lavon Paganini, PA-C  polyethylene glycol (MIRALAX / GLYCOLAX) packet Take 17 g by mouth daily. Mix with 8-10 oz water and drink   Yes [provider]  potassium chloride SA (K-DUR,KLOR-CON) 20 MEQ tablet Take 20 mEq by mouth daily.   Yes [provider]  PRESCRIPTION MEDICATION See admin instructions. Administer normal saline 1000 ml @@ 25 ml/hr via clysis every shift (day, evening, night)   Yes [provider]  traMADol (ULTRAM) 50 MG tablet Take 1 tablet (50 mg total) by mouth every 8 (eight) hours as needed for severe pain. Patient taking differently: Take 50 mg by mouth See admin instructions. Take one tablet  (50 mg) by mouth every 12 hours, may also take one tablet every 8 hours as needed for severe pain 08/07/17  Yes Barton Dubois, MD  feeding supplement, GLUCERNA SHAKE, (GLUCERNA SHAKE) LIQD Take 237 mLs by mouth 3 (three) times daily between meals. Patient not taking: Reported on 08/20/2017 08/07/17   Barton Dubois, MD  levETIRAcetam (KEPPRA) 750 MG tablet Take 2 tablets (1,500 mg total) by mouth 2 (two) times daily. Patient not taking: Reported on 08/21/2017 08/07/17   Barton Dubois, MD    Family History Family History  Problem Relation Age of Onset  . Diabetes Mother   . Hypertension Mother     Social History Social History   Tobacco Use  .  Smoking status: Never Smoker  . Smokeless tobacco: Never Used  Substance Use Topics  . Alcohol use: Yes    Comment: very rare  . Drug use: No     Allergies   Sulfa antibiotics   Review of Systems Review of Systems  Unable to perform ROS: Patient nonverbal     Physical Exam Updated Vital Signs BP (!) 181/88   Pulse 85   Temp 98.9 F (37.2 C) (Rectal)   Resp 20   Ht 5\' 2"  (1.575 m)   Wt 67.6 kg (149 lb)   SpO2 99%   BMI 27.25 kg/m   Physical Exam  Constitutional: She appears well-developed and well-nourished.  HENT:  Head: Normocephalic and atraumatic.  Eyes: Pupils are equal, round, and reactive to light.  Patient has a leftward gaze  Neck: Normal range of motion. Neck supple.  Cardiovascular: Normal rate, regular rhythm and normal heart sounds.  Pulmonary/Chest: Effort normal and breath sounds normal. No respiratory distress. She has no wheezes. She has no rales. She exhibits no tenderness.  Abdominal: Soft. Bowel sounds are normal. There is no tenderness. There is no rebound and no guarding.  Musculoskeletal: Normal range of motion. She exhibits no edema.  Lymphadenopathy:    She has no cervical adenopathy.  Neurological: She is alert.  Patient is awake with eyes open but is nonverbal.  She has weakness and contractures  on her right side from her recent stroke  Skin: Skin is warm and dry. No rash noted.  Psychiatric: She has a normal mood and affect.     ED Treatments / Results  Labs (all labs ordered are listed, but only abnormal results are displayed) Labs Reviewed  URINALYSIS, ROUTINE W REFLEX MICROSCOPIC - Abnormal; Notable for the following components:      Result Value   Ketones, ur 80 (*)    Protein, ur 30 (*)    All other components within normal limits  COMPREHENSIVE METABOLIC PANEL - Abnormal; Notable for the following components:   Potassium 3.3 (*)    Glucose, Bld 104 (*)    Total Protein 6.4 (*)    All other components within normal limits  CBC  CBC  CREATININE, SERUM  BASIC METABOLIC PANEL  CBC  I-STAT CG4 LACTIC ACID, ED  I-STAT CG4 LACTIC ACID, ED    EKG None  Radiology Dg Chest 2 View  Result Date: 09/05/2017 CLINICAL DATA:  Altered mental status EXAM: CHEST - 2 VIEW COMPARISON:  08/24/2017 chest radiograph. FINDINGS: Stable cardiomediastinal silhouette with top-normal heart size. No pneumothorax. No pleural effusion. Stable smooth mild lateral left pleural thickening. No pulmonary edema. No acute consolidative airspace disease. IMPRESSION: No acute cardiopulmonary disease. Stable mild smooth lateral left pleural thickening. Electronically Signed   By: Ilona Sorrel M.D.   On: 08/20/2017 13:37   Ct Head Wo Contrast  Result Date: 08/08/2017 CLINICAL DATA:  67 year old female with altered level of consciousness. EXAM: CT HEAD WITHOUT CONTRAST TECHNIQUE: Contiguous axial images were obtained from the base of the skull through the vertex without intravenous contrast. COMPARISON:  08/24/2017 MR and CT, and prior studies FINDINGS: Brain: No evidence of acute infarction, hemorrhage, hydrocephalus, extra-axial collection or mass lesion/mass effect. Mild atrophy, chronic small-vessel white matter ischemic changes and remote LEFT basal ganglia/thalamic, posterior parietal and occipital  infarcts again noted. Vascular: Intracranial carotid atherosclerotic calcifications again noted. Skull: Normal. Negative for fracture or focal lesion. Sinuses/Orbits: No acute finding. Other: None. IMPRESSION: 1. No evidence of acute intracranial abnormality 2. Atrophy,  chronic small-vessel white matter ischemic changes and remote LEFT-sided infarcts. Electronically Signed   By: Margarette Canada M.D.   On: 08/07/2017 16:10    Procedures Procedures (including critical care time)  Medications Ordered in ED Medications  levETIRAcetam (KEPPRA) IVPB 1500 mg/ 100 mL premix (has no administration in time range)  nystatin (MYCOSTATIN/NYSTOP) topical powder (has no administration in time range)  ceFAZolin (ANCEF) IVPB 1 g/50 mL premix (has no administration in time range)  heparin injection 5,000 Units (has no administration in time range)  sodium chloride flush (NS) 0.9 % injection 3 mL (has no administration in time range)  dextrose 5 % and 0.9 % NaCl with KCl 20 mEq/L infusion (has no administration in time range)  acetaminophen (TYLENOL) tablet 650 mg (has no administration in time range)    Or  acetaminophen (TYLENOL) suppository 650 mg (has no administration in time range)  bisacodyl (DULCOLAX) suppository 10 mg (has no administration in time range)  ondansetron (ZOFRAN) tablet 4 mg (has no administration in time range)    Or  ondansetron (ZOFRAN) injection 4 mg (has no administration in time range)  sodium chloride 0.9 % bolus 500 mL (0 mLs Intravenous Stopped 08/31/2017 1447)     Initial Impression / Assessment and Plan / ED Course  I have reviewed the triage vital signs and the nursing notes.  Pertinent labs & imaging results that were available during my care of the patient were reviewed by me and considered in my medical decision making (see chart for details).     15:00 patient's daughter is at bedside.  She states that patient has declined since her recent visit on July 19.  She normally  is interactive and following commands and in fact that her recent PMR visit she was able to interact with the physician.  Since that time she is been not responding.  She is not following commands.  Daughter states that she has not eaten, drinking or take any medications in the last 2 days.  She also states that she had some increase in her seizure activity and her Keppra was increased.  She did start baclofen recently although she states that her decline in mental status started prior to the baclofen.  Given this, will recheck head CT even though she had a recent CT and MRI which were negative for acute abnormalities.  CT scan does not show any acute abnormalities.  This could be medication related which is causing her mental status decline or she could be having some ongoing seizure activity although there is no visualized seizure activity currently.  I spoke with Dr. Evangeline Gula with the hospitalist service who will admit the patient.  I have also consulted with Dr. Lorraine Lax with the neurology service he will see the patient.  Final Clinical Impressions(s) / ED Diagnoses   Final diagnoses:  Altered mental status, unspecified altered mental status type    ED Discharge Orders    None       Malvin Johns, MD 08/12/2017 [id number]

## 2017-09-01 NOTE — H&P (Signed)
History and Physical    Ebony Williams XTG:[id number] DOB: 03-Sep-1950 DOA: 08/31/2017  PCP: Garwin Brothers, MD at SNF Patient coming from: SNF via EMS  I have personally briefly reviewed patient's old medical records in [place]  Chief Complaint: Altered mental status  HPI: Ebony Williams is a 67 y.o. 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 every 12 hours who presents to our facility with worsening mental status and lethargy.  She has had admissions for UTI and for seizures since her stroke in March.  She has been unable to leave the skilled facility.  Initially she was discharged to inpatient rehab but did poorly and subsequently was transferred to skilled facility.  She was seen in our facility on July 19 for an altered mental status and was found to have a urinary tract infection that grew out a fairly sensitive Klebsiella.  She was started on Keflex and started taking it but then her mental status did not significantly improve and has continued to be poor she has not had any medicines since Thursday.  On Wednesday she was awake and oriented.  She had a been seen her physical medicine physician and baclofen was ordered but she has not taken any of it yet as her mental status declined and she has been unable to take her medication.  CT scan and MRI were done recently which did not show any evidence of new strokes or hemorrhage.  She has had post stroke seizures and there is a concern that this until status change may be some kind of seizure activity.  She will be admitted into the hospital for treatment with IV antibiotics due to partially treated urinary tract infection and for neurology evaluation.  Hopefully we can get her mental status improved to.  Review of Systems: Pain is patient noncommunicative  Past  Medical History:  Diagnosis Date  . History of kidney stones    x1  . Hypertension     Past Surgical History:  Procedure Laterality Date  . CHOLECYSTECTOMY     laparoscopic  . COLONOSCOPY WITH PROPOFOL N/A 01/12/2015   Procedure: COLONOSCOPY WITH PROPOFOL;  Surgeon: Garlan Fair, MD;  Location: [facility];  Service: Endoscopy;  Laterality: N/A;  . TUBAL LIGATION      Social History   Social History Narrative  . Not on file     reports that she has never smoked. She has never used smokeless tobacco. She reports that she drinks alcohol. She reports that she does not use drugs.  Allergies  Allergen Reactions  . Sulfa Antibiotics Diarrhea and Itching    Family History  Problem Relation Age of Onset  . Diabetes Mother   . Hypertension Mother     Prior to Admission medications   Medication Sig Start Date End Date Taking? Authorizing Provider  acetaminophen (TYLENOL) 325 MG tablet Take 2 tablets (650 mg total) by mouth every 6 (six) hours as needed for mild pain, fever or headache. Patient taking differently: Take 650 mg by mouth every 6 (six) hours as needed for fever (general discomfort).  08/07/17  Yes Barton Dubois, MD  ALPRAZolam Duanne Moron) 0.5 MG tablet Take 1 tablet (0.5 mg total) by mouth 3 (three) times daily as needed for anxiety or sleep. Patient taking differently: Take 0.5 mg by mouth every 8 (eight) hours as needed  for anxiety or sleep.  08/24/17  Yes Drenda Freeze, MD  amantadine (SYMMETREL) 100 MG capsule Take 2 capsules (200 mg total) by mouth 2 (two) times daily. 05/08/17  Yes Angiulli, Lavon Paganini, PA-C  amLODipine (NORVASC) 10 MG tablet Take 1 tablet (10 mg total) by mouth daily. 08/07/17  Yes Barton Dubois, MD  aspirin 81 MG chewable tablet Chew 1 tablet (81 mg total) by mouth daily. 05/08/17  Yes Angiulli, Lavon Paganini, PA-C  atorvastatin (LIPITOR) 40 MG tablet Take 1 tablet (40 mg total) by mouth daily. 06/22/17  Yes Patrecia Pour, MD  cephALEXin (KEFLEX) 500 MG  capsule Take 500 mg by mouth every 12 (twelve) hours. 7 day course (for UTI) started 08/28/17 pm   Yes [provider]  clopidogrel (PLAVIX) 75 MG tablet Take 1 tablet (75 mg total) by mouth daily. 05/08/17  Yes Angiulli, Lavon Paganini, PA-C  FLUoxetine (PROZAC) 40 MG capsule Take 1 capsule (40 mg total) by mouth at bedtime. 06/22/17  Yes Patrecia Pour, MD  levETIRAcetam (KEPPRA) 100 MG/ML solution Take 1,500 mg by mouth 2 (two) times daily.   Yes [provider]  lisinopril (PRINIVIL,ZESTRIL) 5 MG tablet Take 5 mg by mouth daily.   Yes [provider]  metFORMIN (GLUCOPHAGE) 500 MG tablet Take 1 tablet (500 mg total) by mouth daily with breakfast. 05/08/17  Yes Angiulli, Lavon Paganini, PA-C  nystatin (NYSTATIN) powder Apply topically See admin instructions. Apply topically to groin every 12 hours as needed for rash/irritation   Yes [provider]  ondansetron (ZOFRAN) 4 MG tablet Take 4 mg by mouth every 8 (eight) hours as needed for nausea or vomiting.   Yes [provider]  pantoprazole (PROTONIX) 40 MG tablet Take 1 tablet (40 mg total) by mouth daily. 05/07/17  Yes Angiulli, Lavon Paganini, PA-C  polyethylene glycol (MIRALAX / GLYCOLAX) packet Take 17 g by mouth daily. Mix with 8-10 oz water and drink   Yes [provider]  potassium chloride SA (K-DUR,KLOR-CON) 20 MEQ tablet Take 20 mEq by mouth daily.   Yes [provider]  PRESCRIPTION MEDICATION See admin instructions. Administer normal saline 1000 ml @@ 25 ml/hr via clysis every shift (day, evening, night)   Yes [provider]  traMADol (ULTRAM) 50 MG tablet Take 1 tablet (50 mg total) by mouth every 8 (eight) hours as needed for severe pain. Patient taking differently: Take 50 mg by mouth See admin instructions. Take one tablet (50 mg) by mouth every 12 hours, may also take one tablet every 8 hours as needed for severe pain 08/07/17  Yes Barton Dubois, MD  feeding supplement, GLUCERNA SHAKE,  (GLUCERNA SHAKE) LIQD Take 237 mLs by mouth 3 (three) times daily between meals. Patient not taking: Reported on 08/22/2017 08/07/17   Barton Dubois, MD  levETIRAcetam (KEPPRA) 750 MG tablet Take 2 tablets (1,500 mg total) by mouth 2 (two) times daily. Patient not taking: Reported on 08/09/2017 08/07/17   Barton Dubois, MD    Physical Exam:  Constitutional: Unresponsive to deep stimuli, moves extremities. Vitals:   09/05/2017 1430 08/20/2017 1500 08/07/2017 1526 08/31/2017 1600  BP: (!) 176/85 (!) 175/84 (!) 170/86 (!) 181/88  Pulse: 80 84 86 85  Resp: (!) 21 (!) 21 20 20   Temp:      TempSrc:      SpO2: 95% 96% 94% 99%  Weight:      Height:       Eyes: Equal reactive and round lids and  conjunctivae normal ENMT: Mucous membranes are dry. Posterior pharynx clear of any exudate or lesions.Normal dentition.  Neck: normal, supple, no masses, no thyromegaly Respiratory: clear to auscultation bilaterally, no wheezing, no crackles. Normal respiratory effort. No accessory muscle use.  Cardiovascular: Regular rate and rhythm, no murmurs / rubs / gallops. No extremity edema. 2+ pedal pulses. No carotid bruits.  Abdomen: no tenderness, no masses palpated. No hepatosplenomegaly. Bowel sounds positive.  Musculoskeletal: no clubbing / cyanosis. No joint deformity upper and lower extremities.  Poor muscle tone Skin: no rashes, lesions, ulcers. No induration Neurologic:  Sensation intact, hyperreflexic on the right Psychiatric: Does not follow commands is unresponsive; her eyes did open towards the end of the examination and she does track  Labs on Admission: I have personally reviewed following labs and imaging studies  CBC: Recent Labs  Lab 08/13/2017 1247  WBC 8.2  HGB 14.8  HCT 43.9  MCV 93.6  PLT 419   Basic Metabolic Panel: Recent Labs  Lab 08/11/2017 1419  NA 141  K 3.3*  CL 107  CO2 22  GLUCOSE 104*  BUN 15  CREATININE 0.49  CALCIUM 9.0   GFR: Estimated Creatinine Clearance: 59.8  mL/min (by C-G formula based on SCr of 0.49 mg/dL). Liver Function Tests: Recent Labs  Lab 08/26/2017 1419  AST 25  ALT 19  ALKPHOS 52  BILITOT 1.2  PROT 6.4*  ALBUMIN 3.6   Urine analysis:    Component Value Date/Time   COLORURINE YELLOW 08/27/2017 1234   APPEARANCEUR CLEAR 08/13/2017 1234   LABSPEC 1.015 08/20/2017 1234   PHURINE 5.0 08/14/2017 1234   GLUCOSEU NEGATIVE 08/07/2017 [place] 08/25/2017 [place] 08/28/2017 1234   KETONESUR 80 (A) 08/06/2017 1234   PROTEINUR 30 (A) 08/20/2017 1234   NITRITE NEGATIVE 08/12/2017 1234   LEUKOCYTESUR NEGATIVE 08/11/2017 1234    Radiological Exams on Admission: Dg Chest 2 View  Result Date: 08/27/2017 CLINICAL DATA:  Altered mental status EXAM: CHEST - 2 VIEW COMPARISON:  08/24/2017 chest radiograph. FINDINGS: Stable cardiomediastinal silhouette with top-normal heart size. No pneumothorax. No pleural effusion. Stable smooth mild lateral left pleural thickening. No pulmonary edema. No acute consolidative airspace disease. IMPRESSION: No acute cardiopulmonary disease. Stable mild smooth lateral left pleural thickening. Electronically Signed   By: Ilona Sorrel M.D.   On: 08/09/2017 13:37   Ct Head Wo Contrast  Result Date: 08/22/2017 CLINICAL DATA:  67 year old female with altered level of consciousness. EXAM: CT HEAD WITHOUT CONTRAST TECHNIQUE: Contiguous axial images were obtained from the base of the skull through the vertex without intravenous contrast. COMPARISON:  08/24/2017 MR and CT, and prior studies FINDINGS: Brain: No evidence of acute infarction, hemorrhage, hydrocephalus, extra-axial collection or mass lesion/mass effect. Mild atrophy, chronic small-vessel white matter ischemic changes and remote LEFT basal ganglia/thalamic, posterior parietal and occipital infarcts again noted. Vascular: Intracranial carotid atherosclerotic calcifications again noted. Skull: Normal. Negative for fracture or focal  lesion. Sinuses/Orbits: No acute finding. Other: None. IMPRESSION: 1. No evidence of acute intracranial abnormality 2. Atrophy, chronic small-vessel white matter ischemic changes and remote LEFT-sided infarcts. Electronically Signed   By: Margarette Canada M.D.   On: 08/30/2017 16:10    EKG: Independently reviewed.  7/22 showed sinus tachycardia with poor R wave progression no change from previous telemetry monitoring today shows sinus rhythm. 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  Metabolic encephalopathy vs status epilepticus: Differential diagnosis includes possible seizures versus acute change in mental status from UTI.  Will treat underlying cause will consult neurology.  2.  UTI due to Klebsiella species: We will start IV Ancef 1 g IV every 8 hours.  3.  Seizure is a late effect of stroke: IV Keppra has been ordered.  Patient has not been able to get her medications since Thursday.  4.  Cerebral infarction due to occlusion of left middle cerebral artery status post IV TPA: Currently prognosis is fairly dismal as patient doing very poorly.  5.  Dysphasia status post stroke and spastic hemiplegia right dominant side as late effect of cerebral infarction: We will get speech therapy to see the patient if she wakes up enough to eat.  See below.  6.  Diastolic dysfunction: Continue medications when able to eat.  7.  As of care counseling and discussion/DNR/palliative care consult: Patient's husband reports that he and his wife  have had discussions regarding care should she have a traumatic event.  She does not want to be resuscitated.  She does not want to be a "vegetable lying in a bed".  Consult palliative medicine for assistance in clarifying goals of care and possibly a MOLST form.  Pertinent right now is that if she does not wake up she may need question regarding tube feedings.  DVT prophylaxis: Heparin Code Status: DO NOT RESUSCITATE Family Communication: Stents of discussion with patient's husband and daughter who are present in the room at the time of admission. Disposition Plan: Likely back to skilled facility Consults called: Neurology, palliative care Admission status: Inpatient   Lady Deutscher MD [facility] Pager [phone]  If 7PM-7AM, please contact night-coverage [email or web address] Password [facility]  09/04/2017, 5:15 PM

## 2017-09-01 NOTE — ED Triage Notes (Addendum)
Pt BIBA or c/o AMS x 2 days ; pt recently diagnosed with UTI on the 19th; facility has only given pt 3-4 doses of abx ; Pt usually able to follow commands and have a conversation per family  ; pt unable to follow simple commans ; pt has hx of stroke with  Residual Right sided weakness and slurred speech

## 2017-09-01 NOTE — Consult Note (Signed)
Neurology Consultation Reason for Consult: Seizures Referring Physician: Randa Spike  CC: Altered mental status  History is obtained from: Chart review  HPI: Ebony Williams is a 67 y.o. female with a history of stroke who is had multiple admissions over the past few months for acute onset aphasia. In March, she was admitted with patchy left MCA territory infarcts. She was discharged with fairly severe aphasia, able to follow simple midline commands and simple one repetition, right hemiplegia.   She was then readmitted in May with concern for left posterior temporoparietal seizures. She did 24 hour EEG with frequent epileptiform discharges. She was started on Keppra with subsequent clinical improvement.  She was then admitted to [facility] in late June and Dr. Merlene Laughter increased her Keppra to 1500 mg twice a day. She improved with the increased dose and an EEG at that time showed infrequent left temporal epileptiform discharges.  She was then discharged but was seen on July 19 with worsening aphasia again and was diagnosed with UTI. She was started on Keflex. Since Wednesday, she has had a decline in mental status without speech. No definite seizure activity was seen, however nursing reports the family states they saw some shaking of her leg since admission here around 8 PM.   LKW: Last Wednesday tpa given?: no, out of window Premorbid modified rankin scale: 4 or 5    ROS: Unable to obtain due to altered mental status.   Past Medical History:  Diagnosis Date  . History of kidney stones    x1  . Hypertension      Family History  Problem Relation Age of Onset  . Diabetes Mother   . Hypertension Mother      Social History:  reports that she has never smoked. She has never used smokeless tobacco. She reports that she drinks alcohol. She reports that she does not use drugs.   Exam: Current vital signs: BP (!) 179/89 (BP Location: Left Arm)   Pulse 89   Temp  98.3 F (36.8 C) (Oral)   Resp 16   Ht 5\' 2"  (1.575 m)   Wt 66.6 kg (146 lb 13.2 oz)   SpO2 93%   BMI 26.85 kg/m  Vital signs in last 24 hours: Temp:  [97.9 F (36.6 C)-98.9 F (37.2 C)] 98.3 F (36.8 C) (07/27 2008) Pulse Rate:  [78-89] 89 (07/27 2008) Resp:  [16-24] 16 (07/27 2008) BP: (155-181)/(74-95) 179/89 (07/27 2008) SpO2:  [93 %-99 %] 93 % (07/27 2008) Weight:  [66.6 kg (146 lb 13.2 oz)-67.6 kg (149 lb)] 66.6 kg (146 lb 13.2 oz) (07/27 1821)   Physical Exam  Constitutional: Appears well-developed and well-nourished.  Psych: Affect appropriate to situation Eyes: No scleral injection HENT: No OP obstrucion Head: Normocephalic.  Cardiovascular: Normal rate and regular rhythm.  Respiratory: Effort normal, non-labored breathing GI: Soft.  No distension. There is no tenderness.  Skin: WDI  Neuro: Mental Status: Patient is awake, alert, densely aphasic, does not follow commands Cranial Nerves: II: Blinks to threat from the right but not the left pupils are unequal with the right larger than the left,  III,IV, VI: EOMI without ptosis or diploplia.  V: she does respond to stimuli in the right face VII: Facial movement  with right facial weakness  VIII: hearing is intact to voice X: Uvula elevates symmetrically XI: Shoulder shrug is symmetric. XII: tongue is midline without atrophy or fasciculations.  Motor: She has a severe right hemiparesis, but does flex versus withdrawal  to noxious stimuli in the right arm and leg Sensory : She does react to noxious stimuli in all 4 extremities  Plantars: Downgoing on the left, upgoing on the right Cerebellar : Does not perform    I have reviewed labs in epic and the results pertinent to this consultation are: UA-negative   CBC-normal CMP-unremarkable, normal creatinine Lactic acid-normal  I have reviewed the images obtained:   CT head-no acute abnormality   Impression: 67 year old female with worsening aphasia in  setting of previous left MCA territory strokes as well as previous seizures resulting in worsening mental status. Given the family's report of leg shaking, I do think it would be more aggressive with her antiepileptics and also given her history of subtle clinical seizures, I think that 24 hours of monitoring to assess for intermittent seizures would be prudent.   Also possible would be extension of her previous infarct resulting in worsening of her aphasia.  Recommendations: 1) fosphenytoin load followed by 100 milligrams 3 times a day 2) overnight EEG starting tomorrow  3) MRI brain   Roland Rack, MD [facility] [phone]  If 7pm- 7am, please page neurology on call as listed in [place].

## 2017-09-02 ENCOUNTER — Inpatient Hospital Stay (HOSPITAL_COMMUNITY): Payer: Medicare Other

## 2017-09-02 DIAGNOSIS — I5189 Other ill-defined heart diseases: Secondary | ICD-10-CM

## 2017-09-02 DIAGNOSIS — I69398 Other sequelae of cerebral infarction: Principal | ICD-10-CM

## 2017-09-02 DIAGNOSIS — R569 Unspecified convulsions: Secondary | ICD-10-CM

## 2017-09-02 DIAGNOSIS — I69351 Hemiplegia and hemiparesis following cerebral infarction affecting right dominant side: Secondary | ICD-10-CM

## 2017-09-02 DIAGNOSIS — N390 Urinary tract infection, site not specified: Secondary | ICD-10-CM

## 2017-09-02 DIAGNOSIS — B961 Klebsiella pneumoniae [K. pneumoniae] as the cause of diseases classified elsewhere: Secondary | ICD-10-CM

## 2017-09-02 LAB — CBC
HCT: 42.7 % (ref 36.0–46.0)
HEMOGLOBIN: 14.5 g/dL (ref 12.0–15.0)
MCH: 31.3 pg (ref 26.0–34.0)
MCHC: 34 g/dL (ref 30.0–36.0)
MCV: 92.2 fL (ref 78.0–100.0)
Platelets: 261 10*3/uL (ref 150–400)
RBC: 4.63 MIL/uL (ref 3.87–5.11)
RDW: 12.4 % (ref 11.5–15.5)
WBC: 8.1 10*3/uL (ref 4.0–10.5)

## 2017-09-02 LAB — GLUCOSE, CAPILLARY: Glucose-Capillary: 154 mg/dL — ABNORMAL HIGH (ref 70–99)

## 2017-09-02 LAB — BASIC METABOLIC PANEL
ANION GAP: 12 (ref 5–15)
BUN: 9 mg/dL (ref 8–23)
CALCIUM: 9 mg/dL (ref 8.9–10.3)
CO2: 24 mmol/L (ref 22–32)
Chloride: 106 mmol/L (ref 98–111)
Creatinine, Ser: 0.42 mg/dL — ABNORMAL LOW (ref 0.44–1.00)
GFR calc Af Amer: >60 mL/min (ref >60)
GFR calc non Af Amer: >60 mL/min (ref >60)
GLUCOSE: 149 mg/dL — AB (ref 70–99)
Potassium: 2.9 mmol/L — ABNORMAL LOW (ref 3.5–5.1)
Sodium: 142 mmol/L (ref 135–145)

## 2017-09-02 LAB — MRSA PCR SCREENING: MRSA by PCR: NEGATIVE

## 2017-09-02 LAB — MAGNESIUM: MAGNESIUM: 1.8 mg/dL (ref 1.7–2.4)

## 2017-09-02 LAB — PHENYTOIN LEVEL, TOTAL: Phenytoin Lvl: 22.2 ug/mL — ABNORMAL HIGH (ref 10.0–20.0)

## 2017-09-02 MED ORDER — HYDRALAZINE HCL 20 MG/ML IJ SOLN: 10.0000 mg | Freq: Three times a day (TID) | INTRAMUSCULAR | Status: DC | PRN

## 2017-09-02 MED ORDER — LACOSAMIDE 200 MG/20ML IV SOLN
200.0000 mg | Freq: Two times a day (BID) | INTRAVENOUS | Status: DC
  Administered 2017-09-02 – 2017-09-11 (×19): 200 mg via INTRAVENOUS
  Filled 2017-09-02 (×25): qty 20

## 2017-09-02 MED ORDER — SODIUM CHLORIDE 0.9 % IV SOLN
200.0000 mg | Freq: Once | INTRAVENOUS | Status: AC
  Administered 2017-09-02: 200 mg via INTRAVENOUS
  Filled 2017-09-02: qty 20

## 2017-09-02 MED ORDER — ACETAMINOPHEN 10 MG/ML IV SOLN: 1000.0000 mg | Freq: Once | INTRAVENOUS | Status: DC

## 2017-09-02 MED ORDER — SODIUM CHLORIDE 0.9 % IV SOLN
1.0000 g | INTRAVENOUS | Status: DC
  Administered 2017-09-02 – 2017-09-08 (×6): 1 g via INTRAVENOUS
  Filled 2017-09-02 (×7): qty 10

## 2017-09-02 MED ORDER — KETOROLAC TROMETHAMINE 30 MG/ML IJ SOLN
30.0000 mg | Freq: Four times a day (QID) | INTRAMUSCULAR | Status: AC
  Administered 2017-09-02 – 2017-09-04 (×8): 30 mg via INTRAVENOUS
  Filled 2017-09-02 (×8): qty 1

## 2017-09-02 MED ORDER — ENOXAPARIN SODIUM 40 MG/0.4ML ~~LOC~~ SOLN
40.0000 mg | SUBCUTANEOUS | Status: DC
  Administered 2017-09-03 – 2017-09-11 (×9): 40 mg via SUBCUTANEOUS
  Filled 2017-09-02 (×9): qty 0.4

## 2017-09-02 MED ORDER — HYDRALAZINE HCL 20 MG/ML IJ SOLN
10.0000 mg | INTRAMUSCULAR | Status: DC | PRN
  Administered 2017-09-04 (×2): 10 mg via INTRAVENOUS
  Filled 2017-09-02 (×3): qty 1

## 2017-09-02 MED ORDER — KETOROLAC TROMETHAMINE 30 MG/ML IJ SOLN
30.0000 mg | Freq: Three times a day (TID) | INTRAMUSCULAR | Status: DC
  Administered 2017-09-02: 30 mg via INTRAVENOUS
  Filled 2017-09-02: qty 1

## 2017-09-02 MED ORDER — POTASSIUM CHLORIDE 10 MEQ/100ML IV SOLN
10.0000 meq | INTRAVENOUS | Status: AC
  Administered 2017-09-02 (×3): 10 meq via INTRAVENOUS
  Filled 2017-09-02 (×4): qty 100

## 2017-09-02 NOTE — Progress Notes (Signed)
LTM EEG was started

## 2017-09-02 NOTE — Evaluation (Signed)
Speech Language Pathology Evaluation Patient Details Name: Ebony Williams MRN: [id number] DOB: 1950/05/18 Today's Date: 09/02/2017 Time: 7782-4235 SLP Time Calculation (min) (ACUTE ONLY): 8 min  Problem List:  Patient Active Problem List   Diagnosis Date Noted  . Metabolic encephalopathy 36/14/4315  . UTI due to Klebsiella species 08/20/2017  . Seizure, late effect of stroke ([place]) 08/12/2017  . 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 Malnutrition of moderate degree 08/06/2017  . DNR (do not resuscitate) discussion   . Seizure ([place])   . Palliative care by specialist   . Goals of care, counseling/discussion   . Altered mental status 08/01/2017  . Right arm weakness 06/16/2017  . Aphasia 06/16/2017  . Spastic hemiplegia of right dominant side as late effect of cerebral infarction ([place])   . Hyponatremia   . Labile blood pressure   . Hypokalemia   . Elevated BUN   . Lethargy   . Transaminitis   . Essential hypertension   . Hypertensive crisis   . Left middle cerebral artery stroke ([place]) 04/11/2017  . Depression   . Dysphagia, post-stroke   . Diastolic dysfunction   . Benign essential HTN   . Prediabetes   . Leukocytosis   . 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 Stroke (cerebrum) ([place]) 04/06/2017  . 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 Pain in joint, shoulder region 10/18/2011   Past Medical History:  Past Medical History:  Diagnosis Date  . History of kidney stones    x1  . Hypertension    Past Surgical History:  Past Surgical History:  Procedure Laterality Date  . CHOLECYSTECTOMY     laparoscopic  . COLONOSCOPY WITH PROPOFOL N/A 01/12/2015   Procedure: COLONOSCOPY WITH PROPOFOL;  Surgeon: Garlan Fair, MD;  Location: [facility];  Service: Endoscopy;  Laterality: N/A;  . TUBAL LIGATION     HPI:  Pt is a 67 year old female with worsening aphasia, AMS in the setting of previous left MCA territory strokes as  well as previous seizures. MRI negative for acute infarct. She was followed on CIR after CVA in March 2019 for simple communication goals and dysphagia, advanced to Dys 2 diet and thin liquids with head turn R. Repeat BSEs in May 2019 and July 2019 recommended regular or Dys 3 diet, both with thin liquids. PMH also includes HTN.   Assessment / Plan / Recommendation Clinical Impression  Pt is lethargic this morning, opening her eyes briefly to name calling ~50% of the time. Attempted repositioning, use of familiar music, family to maximize arousal. Pt does not follow one-step commands or answer simple yes/no questions, but she did have some spontaneous, purposeful movement. [name] says that at baseline she was speaking at the sentence level, but that she would intermittently have paraphasias. Pt reportedly had good awareness of her linguistic errors. SLP will continue to follow to maximize functional cognitive-linguistic status as medical w/u is underway.    SLP Assessment  SLP Recommendation/Assessment: Patient needs continued Speech Lanaguage Pathology Services SLP Visit Diagnosis: Cognitive communication deficit (R41.841)    Follow Up Recommendations  Skilled Nursing facility    Frequency and Duration min 2x/week  2 weeks      SLP Evaluation Cognition  Overall Cognitive Status: Impaired/Different from baseline Arousal/Alertness: Lethargic Attention: Focused Focused Attention: Impaired Focused Attention Impairment: Functional basic;Verbal basic       Comprehension  Auditory Comprehension Overall Auditory Comprehension: Impaired Yes/No Questions: Impaired Basic Biographical Questions: 0-25%  accurate(no responses) Commands: Impaired One Step Basic Commands: 0-24% accurate(no responses) Interfering Components: Other (comment)(level of alertness)    Expression Expression Primary Mode of Expression: Verbal Verbal Expression Overall Verbal Expression: Impaired at  baseline Initiation: Impaired Automatic Speech: (none) Level of Generative/Spontaneous Verbalization: (none)   Oral / Motor  Oral Motor/Sensory Function Overall Oral Motor/Sensory Function: (UTA) Motor Speech Overall Motor Speech: (UTA)   GO                    Germain Osgood 09/02/2017, 10:21 AM  Germain Osgood, M.A. CCC-SLP [phone]

## 2017-09-02 NOTE — Progress Notes (Signed)
PROGRESS NOTE  Ebony Williams AST:[id number] DOB: 1950/05/27 DOA: 08/31/2017 PCP: Sharilyn Sites, MD  HPI/Recap of past 74 hours: 67 year old female with medical history significant for recent large MCA distribution stroke in 3/19, with subsequent seizures, hypertension, hyperlipidemia, recent Klebsiella UTI currently on outpatient treatment, presented to the ER from skilled facility due to worsening mental status and lethargy for the past 2 days prior to arrival.  Of note, patient has had multiple admissions since March after her stroke, the last admission was due to Klebsiella UTI of which she was started on Keflex, but never completed due to altered mental status.  In the ED, CT head was negative for any acute intracranial abnormalities.  Patient admitted for further work-up by neurologist and IV treatment of UTI.  Of note, daughter reported that patient was able to have little conversations, and used a wheelchair for ambulation, reports that she had a sudden change of mental status over the past 2 days.  Also has recorded feet U of patient having what seems to be like ?myoclonic jerks while awake.   Today, patient minimally responsive, although responds to pain.  Unable to perform ROS.  Discussed with the daughter extensively about overall medical management.  Daughter will also like to speak with the neurologist.   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   Dysphagia, post-stroke   Diastolic dysfunction   Benign essential HTN   Spastic hemiplegia of right dominant side as late effect of cerebral infarction [facility])   Palliative care by specialist   Goals of care, counseling/discussion   DNR (do not resuscitate) discussion   UTI due to Klebsiella species   Seizure, late effect of stroke ([place])  Acute metabolic encephalopathy Likely due to ?recurrent CVA,  ?seziures, ?UTI/sepsis Afebrile, no leukocytosis Lactic acid within normal limits BC x2 pending Chest x-ray unremarkable CT head with no acute intracranial abnormalities 24-hour EEG, MRI pending N.p.o., IV fluids Telemetry  UTI secondary to Klebsiella UC grew >100,000 pansensitive Klebsiella Start IV Rocephin  History of seizure due to large CVA No witnessed seizure-like activity since admission 24-hour EEG, MRI pending Neurology on board Continue IV Keppra, start Dilantin as per neurology Seizure/fall precautions  History of large CVA in 3/19 Occlusion of left middle cerebral artery status post IV TPA Currently wheelchair-bound, in the skilled facility ??recurrent MRI pending Neurology on board Hold home aspirin, Plavix, statins due to n.p.o. status  Hypokalemia Replace PRN  Dysphasia status post stroke Due to encephalopathy, will keep patient n.p.o. SLP on board  DM type II Controlled Last A1c 07/2017 was 5.1 Hold home metformin, may discontinue upon discharge  Hypertension IV hydralazine PRN Home lisinopril, amlodipine  Hyperlipidemia LDL 107 in 04/2017 Hold home statins for now  [place] Palliative care consulted    Code Status: DNR  Family Communication: Discussed extensively with daughter  Disposition Plan: To be determined   Consultants:  Neurology  Palliative care  Procedures:  None  Antimicrobials:  IV Rocephin  DVT prophylaxis: Lovenox   Objective: Vitals:   08/30/2017 2345 09/02/17 0428 09/02/17 0500 09/02/17 0913  BP: (!) 177/88 (!) 165/68  (!) 195/65  Pulse: 90 86  80  Resp: 18 16  20   Temp:  97.6 F (36.4 C)  97.7 F (36.5 C)  TempSrc:  Oral  Oral  SpO2: 99% 98%  98%  Weight:   68.1 kg (150 lb 2.1 oz)  Height:        Intake/Output Summary (Last 24 hours) at 09/02/2017 1111 Last data filed at 09/02/2017 0709 Gross per 24 hour  Intake 1555.92 ml  Output -  Net 1555.92 ml   Filed Weights   08/28/2017 1237 09/05/2017  1821 09/02/17 0500  Weight: 67.6 kg (149 lb) 66.6 kg (146 lb 13.2 oz) 68.1 kg (150 lb 2.1 oz)    Exam:   General: Lethargic, moans to pain  Cardiovascular: S1, S2 present  Respiratory: CTA B  Abdomen: Soft, nontender, nondistended, bowel sounds present  Musculoskeletal: No pedal edema bilaterally  Skin: Normal  Psychiatry: Unable to assess  Neurology: Severe residual right hemiparesis, currently unable to follow commands   Data Reviewed: CBC: Recent Labs  Lab 08/27/2017 1247 09/02/17 0633  WBC 8.2 8.1  HGB 14.8 14.5  HCT 43.9 42.7  MCV 93.6 92.2  PLT 269 782   Basic Metabolic Panel: Recent Labs  Lab 08/06/2017 1419 08/09/2017 2353 09/02/17 0633  NA 141  --  142  K 3.3*  --  2.9*  CL 107  --  106  CO2 22  --  24  GLUCOSE 104*  --  149*  BUN 15  --  9  CREATININE 0.49  --  0.42*  CALCIUM 9.0  --  9.0  MG  --  1.8  --    GFR: Estimated Creatinine Clearance: 60 mL/min (A) (by C-G formula based on SCr of 0.42 mg/dL (L)). Liver Function Tests: Recent Labs  Lab 08/30/2017 1419  AST 25  ALT 19  ALKPHOS 52  BILITOT 1.2  PROT 6.4*  ALBUMIN 3.6   No results for input(s): LIPASE, AMYLASE in the last 168 hours. No results for input(s): AMMONIA in the last 168 hours. Coagulation Profile: No results for input(s): INR, PROTIME in the last 168 hours. Cardiac Enzymes: No results for input(s): CKTOTAL, CKMB, CKMBINDEX, TROPONINI in the last 168 hours. BNP (last 3 results) No results for input(s): PROBNP in the last 8760 hours. HbA1C: No results for input(s): HGBA1C in the last 72 hours. CBG: No results for input(s): GLUCAP in the last 168 hours. Lipid Profile: No results for input(s): CHOL, HDL, LDLCALC, TRIG, CHOLHDL, LDLDIRECT in the last 72 hours. Thyroid Function Tests: No results for input(s): TSH, T4TOTAL, FREET4, T3FREE, THYROIDAB in the last 72 hours. Anemia Panel: No results for input(s): VITAMINB12, FOLATE, FERRITIN, TIBC, IRON, RETICCTPCT in the last  72 hours. Urine analysis:    Component Value Date/Time   COLORURINE YELLOW 08/12/2017 [place] 08/17/2017 1234   LABSPEC 1.015 08/26/2017 1234   PHURINE 5.0 08/06/2017 1234   GLUCOSEU NEGATIVE 09/04/2017 1234   HGBUR NEGATIVE 08/10/2017 [place] 08/26/2017 1234   KETONESUR 80 (A) 09/02/2017 1234   PROTEINUR 30 (A) 08/21/2017 1234   NITRITE NEGATIVE 08/18/2017 1234   LEUKOCYTESUR NEGATIVE 08/08/2017 1234   Sepsis Labs: @LABRCNTIP (procalcitonin:4,lacticidven:4)  ) Recent Results (from the past 240 hour(s))  Urine culture     Status: Abnormal   Collection Time: 08/24/17  4:36 PM  Result Value Ref Range Status   Specimen Description URINE, CATHETERIZED  Final   Special Requests   Final    NONE Performed at [facility], [place] [address] [place], [place] [address]    Culture >=100,000 COLONIES/mL KLEBSIELLA PNEUMONIAE (A)  Final   Report Status 08/26/2017 FINAL  Final   Organism ID, Bacteria KLEBSIELLA PNEUMONIAE (A)  Final      Susceptibility  Klebsiella pneumoniae - MIC*    AMPICILLIN RESISTANT Resistant     CEFAZOLIN <=4 SENSITIVE Sensitive     CEFTRIAXONE <=1 SENSITIVE Sensitive     CIPROFLOXACIN 0.5 SENSITIVE Sensitive     GENTAMICIN <=1 SENSITIVE Sensitive     IMIPENEM <=0.25 SENSITIVE Sensitive     NITROFURANTOIN 128 RESISTANT Resistant     TRIMETH/SULFA <=20 SENSITIVE Sensitive     AMPICILLIN/SULBACTAM 4 SENSITIVE Sensitive     PIP/TAZO <=4 SENSITIVE Sensitive     Extended ESBL NEGATIVE Sensitive     * >=100,000 COLONIES/mL KLEBSIELLA PNEUMONIAE  MRSA PCR Screening     Status: None   Collection Time: 08/09/2017 11:27 PM  Result Value Ref Range Status   MRSA by PCR NEGATIVE NEGATIVE Final    Comment:        The GeneXpert MRSA Assay (FDA approved for NASAL specimens only), is one component of a comprehensive MRSA colonization surveillance program. It is not intended to diagnose MRSA infection nor to guide or monitor  treatment for MRSA infections. Performed at [facility], [place] [address], [place], [place] [address]       Studies: Dg Chest 2 View  Result Date: 08/07/2017 CLINICAL DATA:  Altered mental status EXAM: CHEST - 2 VIEW COMPARISON:  08/24/2017 chest radiograph. FINDINGS: Stable cardiomediastinal silhouette with top-normal heart size. No pneumothorax. No pleural effusion. Stable smooth mild lateral left pleural thickening. No pulmonary edema. No acute consolidative airspace disease. IMPRESSION: No acute cardiopulmonary disease. Stable mild smooth lateral left pleural thickening. Electronically Signed   By: Ilona Sorrel M.D.   On: 08/22/2017 13:37   Ct Head Wo Contrast  Result Date: 08/25/2017 CLINICAL DATA:  67 year old female with altered level of consciousness. EXAM: CT HEAD WITHOUT CONTRAST TECHNIQUE: Contiguous axial images were obtained from the base of the skull through the vertex without intravenous contrast. COMPARISON:  08/24/2017 MR and CT, and prior studies FINDINGS: Brain: No evidence of acute infarction, hemorrhage, hydrocephalus, extra-axial collection or mass lesion/mass effect. Mild atrophy, chronic small-vessel white matter ischemic changes and remote LEFT basal ganglia/thalamic, posterior parietal and occipital infarcts again noted. Vascular: Intracranial carotid atherosclerotic calcifications again noted. Skull: Normal. Negative for fracture or focal lesion. Sinuses/Orbits: No acute finding. Other: None. IMPRESSION: 1. No evidence of acute intracranial abnormality 2. Atrophy, chronic small-vessel white matter ischemic changes and remote LEFT-sided infarcts. Electronically Signed   By: Margarette Canada M.D.   On: 09/02/2017 16:10    Scheduled Meds: . heparin  5,000 Units Subcutaneous Q8H  . phenytoin (DILANTIN) IV  100 mg Intravenous Q8H  . sodium chloride flush  3 mL Intravenous Q12H    Continuous Infusions: .  ceFAZolin (ANCEF) IV 1 g (09/02/17 0709)  . dextrose 5 % and 0.9 %  NaCl with KCl 20 mEq/L 100 mL/hr at 09/02/2017 2233  . levETIRAcetam 1,500 mg (09/02/17 0825)  . potassium chloride       LOS: 1 day     Alma Friendly, MD [facility]   If 7PM-7AM, please contact night-coverage [email or web address] Password [email or web address] 09/02/2017, 11:11 AM

## 2017-09-02 NOTE — Progress Notes (Signed)
Initial Nutrition Assessment  DOCUMENTATION CODES:   Non-severe (moderate) malnutrition in context of chronic illness  INTERVENTION:  Monitor for diet advancement  Provide supplemental nutrition as needed  NUTRITION DIAGNOSIS:   Moderate Malnutrition related to chronic illness(recent stroke) as evidenced by mild fat depletion, mild muscle depletion, percent weight loss.  GOAL:   Patient will meet greater than or equal to 90% of their needs  MONITOR:   Diet advancement  REASON FOR ASSESSMENT:   Malnutrition Screening Tool    ASSESSMENT:   Patient with PMH recent stroke in March, seizures, HTN, HLD, recent UTI, presents with metabolic encephaloapthy vs status epilepticus and UTI.   Spoke with patient's daughter at bedside. She reports a UBW of 170 with weight loss to 148 pounds following her stroke in March, a 12% severe weight loss for timeframe. Patient has not eaten anything since Wednesday. She was at a SNF for approximately 1 month prior to admission so daughter is unaware of patient's daily intake there. Patient was being fed 3 meals a day but she does not believe patient was eating much. Patient would not drink milky supplements at baseline but daughter had some success with boost breeze in the past. Daughter also reports "white patchy stuff" in patient's mouth during this admission but she has been unable to clean it because patient will clinch her teeth.  SLP saw patient today and is stating she is not alert enough to take PO. Will monitor for diet advancement.  Medications reviewed and include:  D5 NS 20K+ at 180mL/hr --> 408 calories 10MeQ IV K+ x4 runs  Labs reviewed:  K+ 2.9  NUTRITION - FOCUSED PHYSICAL EXAM:    Most Recent Value  Orbital Region  No depletion  Upper Arm Region  Mild depletion  Thoracic and Lumbar Region  No depletion  Buccal Region  No depletion  Temple Region  Mild depletion  Clavicle Bone Region  Mild depletion  Clavicle and Acromion  Bone Region  Mild depletion  Scapular Bone Region  Unable to assess  Dorsal Hand  Mild depletion  Patellar Region  Mild depletion  Anterior Thigh Region  Mild depletion  Posterior Calf Region  Moderate depletion  Edema (RD Assessment)  None  Hair  Reviewed  Eyes  Reviewed  Mouth  Unable to assess  Skin  Reviewed  Nails  Reviewed       Diet Order:   Diet Order           Diet NPO time specified Except for: Sips with Meds  Diet effective now          EDUCATION NEEDS:   Not appropriate for education at this time  Skin:  Skin Assessment: Reviewed RN Assessment  Last BM:  PTA  Height:   Ht Readings from Last 1 Encounters:  08/13/2017 5\' 2"  (1.575 m)    Weight:   Wt Readings from Last 1 Encounters:  09/02/17 150 lb 2.1 oz (68.1 kg)    Ideal Body Weight:  50 kg  BMI:  Body mass index is 27.46 kg/m.  Estimated Nutritional Needs:   Kcal:  1600-1900 calories  Protein:  89-102 grams (1.3-1.6g/kg)  Fluid:  >1.6L    Ebony Anis. Kaydynce Pat, MS, RD LDN Inpatient Clinical Dietitian Pager [phone]

## 2017-09-02 NOTE — Progress Notes (Signed)
Reason for consult:  Seizures/Altered mental status  Subjective: patient is lethargic, moans. Not following commands. Has been this way since Thursday per daughter.   She was loaded with Dilantin yesterday. Had possible seizure last night. LTM EEG connected this morning.    ROS:  Unable to obtain due to poor mental status  Examination  Vital signs in last 24 hours: Temp:  [97.6 F (36.4 C)-98.9 F (37.2 C)] 97.7 F (36.5 C) (07/28 0913) Pulse Rate:  [78-90] 80 (07/28 0913) Resp:  [16-24] 20 (07/28 0913) BP: (155-195)/(65-95) 195/65 (07/28 0913) SpO2:  [93 %-99 %] 98 % (07/28 0913) Weight:  [66.6 kg (146 lb 13.2 oz)-68.1 kg (150 lb 2.1 oz)] 68.1 kg (150 lb 2.1 oz) (07/28 0500)  General:  between being drowsy and agitated  CVS: pulse-normal rate and rhythm RS: breathing comfortably Extremities: normal   Neuro: MS: Patient drowsy, when awake she moans loudly.  Not following any commands CN: No forced gaze deviation, pupils are equal and reactive.  Mild right facial droop noted Motor: Increased tone in both right upper and lower extremity.  Does not move the side.  Spontaneous movement noted over the left upper and lower extremity.  Occasional myoclonic jerks noted the side about every 30 to 60 seconds. Plantars upgoing in the right Gait was not tested.   Basic Metabolic Panel: Recent Labs  Lab 08/26/2017 1419 08/18/2017 2353 09/02/17 0633  NA 141  --  142  K 3.3*  --  2.9*  CL 107  --  106  CO2 22  --  24  GLUCOSE 104*  --  149*  BUN 15  --  9  CREATININE 0.49  --  0.42*  CALCIUM 9.0  --  9.0  MG  --  1.8  --     CBC: Recent Labs  Lab 08/29/2017 1247 09/02/17 0633  WBC 8.2 8.1  HGB 14.8 14.5  HCT 43.9 42.7  MCV 93.6 92.2  PLT 269 261     Coagulation Studies: No results for input(s): LABPROT, INR in the last 72 hours.  Imaging Reviewed:     ASSESSMENT AND PLAN  67 year old female with worsening aphasia in setting of previous left MCA territory strokes  as well as previous seizures resulting in worsening mental status.  Patient noted to have urinary tract infection secondary to Klebsiella. Also there is concern for seizures and patient loaded with Dilantin yesterday in addition to her Keppra.  Long-term EEG connected today concerning- abnormal EEG due to frequent spike/polyspike and wave discharges from the Lt tempoparietal region.  No obvious seizures recorded and patient had somewhat similar EEG in the past.  Impression Encephalopathy with prior history of seizures, MCA stroke with left MCA stenosis Partial status epilepticus Urosepsis Possible extension of prior MCA infarction   Plan Long-term EEG monitoring: will see if patient having intermittent seizures Added Vimpat to Dilantin and Keppra, will see if EEG improves clinically and patient also improves Mri brain after 24hr EEG to check for extension of infarction Continue treatment of urosepsis Seizure precautions    This patient is neurologically critically ill due to recurrent seizures, infection may worsen neurologically due to worsening seizures, respirator failure, cerebral edema. This patient's care requires constant monitoring of vital signs, hemodynamics, respiratory and cardiac monitoring, review of multiple databases, neurological assessment, discussion with family, other specialists and medical decision making of high complexity.  I spent 45 minutes of neurocritical time in the care of this patient.   Code status: She is  DNR/DNI due to prior deficits from her stroke.     Karena Addison Aroor [facility] Pager Number [phone] For questions after 7pm please refer to AMION to reach the Neurologist on call

## 2017-09-02 NOTE — Progress Notes (Signed)
EEG completed; results pending.    

## 2017-09-02 NOTE — Evaluation (Signed)
Clinical/Bedside Swallow Evaluation Patient Details  Name: Ebony Williams MRN: [id number] Date of Birth: November 13, 1950  Today's Date: 09/02/2017 Time: SLP Start Time (ACUTE ONLY): 0950 SLP Stop Time (ACUTE ONLY): 0958 SLP Time Calculation (min) (ACUTE ONLY): 8 min  Past Medical History:  Past Medical History:  Diagnosis Date  . History of kidney stones    x1  . Hypertension    Past Surgical History:  Past Surgical History:  Procedure Laterality Date  . CHOLECYSTECTOMY     laparoscopic  . COLONOSCOPY WITH PROPOFOL N/A 01/12/2015   Procedure: COLONOSCOPY WITH PROPOFOL;  Surgeon: Garlan Fair, MD;  Location: [facility] ENDOSCOPY;  Service: Endoscopy;  Laterality: N/A;  . TUBAL LIGATION     HPI:  Pt is a 67 year old female with worsening aphasia, AMS in the setting of previous left MCA territory strokes as well as previous seizures. MRI negative for acute infarct. She was followed on CIR after CVA in March 2019 for simple communication goals and dysphagia, advanced to Dys 2 diet and thin liquids with head turn R. Repeat BSEs in May 2019 and July 2019 recommended regular or Dys 3 diet, both with thin liquids. PMH also includes HTN.   Assessment / Plan / Recommendation Clinical Impression  Pt is not alert enough to attempt PO intake. She has minimal eye opening, minimal labial response to stimulation. No lingual movement noted in response to dry spoon. Will continue to follow for PO readiness pending clearer mentation. SLP Visit Diagnosis: Dysphagia, unspecified (R13.10)    Aspiration Risk  Severe aspiration risk    Diet Recommendation NPO   Medication Administration: Via alternative means    Other  Recommendations Oral Care Recommendations: Oral care QID   Follow up Recommendations Skilled Nursing facility      Frequency and Duration min 2x/week  2 weeks       Prognosis Prognosis for Safe Diet Advancement: Good Barriers to Reach Goals: Language deficits;Cognitive deficits       Swallow Study   General HPI: Pt is a 67 year old female with worsening aphasia, AMS in the setting of previous left MCA territory strokes as well as previous seizures. MRI negative for acute infarct. She was followed on CIR after CVA in March 2019 for simple communication goals and dysphagia, advanced to Dys 2 diet and thin liquids with head turn R. Repeat BSEs in May 2019 and July 2019 recommended regular or Dys 3 diet, both with thin liquids. PMH also includes HTN. Type of Study: Bedside Swallow Evaluation Previous Swallow Assessment: see HPI Diet Prior to this Study: NPO Temperature Spikes Noted: No Respiratory Status: Room air History of Recent Intubation: No Behavior/Cognition: Lethargic/Drowsy;Doesn't follow directions Oral Care Completed by SLP: No Oral Cavity - Dentition: Adequate natural dentition Vision: (mostly keeps eyes closed) Self-Feeding Abilities: Total assist Patient Positioning: Upright in bed Baseline Vocal Quality: Not observed Volitional Cough: Cognitively unable to elicit Volitional Swallow: Unable to elicit    Oral/Motor/Sensory Function Overall Oral Motor/Sensory Function: (UTA)   Ice Chips Ice chips: Not tested   Thin Liquid Thin Liquid: Not tested    Nectar Thick Nectar Thick Liquid: Not tested   Honey Thick Honey Thick Liquid: Not tested   Puree Puree: Not tested   Solid     Solid: Not tested      Germain Osgood 09/02/2017,10:15 AM  Germain Osgood, M.A. CCC-SLP [phone]

## 2017-09-02 NOTE — Procedures (Signed)
  ELECTROENCEPHALOGRAM REPORT  Date of Study: 09/02/17  Patient's Name: Ebony Williams MRN: [id number] Date of Birth: 13-Dec-1950  Referring Provider: Samara Snide, MD  Clinical History: Ebony Williams is a 67 y.o. 67 y.o. female with a history of stroke who has had multiple admissions over the past few months for acute onset aphasia.  Admitted in 3/19 for patchy left MCA territory infarcts and discharged with fairly severe aphasia and right hemiplegia. Readmitted in 5/19 with concern for left posterior temporoparietal seizures. EEG's in 5/19 (overnight) and 6/19 notable for Lt posterior TP epileptiform discharges, along with generalized slowing.  She has been treated with Keppra and is now readmitted AMS and worsening aphasia.  Head CT on 08/20/2017 with remoted Lt sided strokes, but no acute changes.   Medications: Scheduled Meds: . [START ON 09/03/2017] enoxaparin (LOVENOX) injection  40 mg Subcutaneous Q24H  . phenytoin (DILANTIN) IV  100 mg Intravenous Q8H  . sodium chloride flush  3 mL Intravenous Q12H   Continuous Infusions: . cefTRIAXone (ROCEPHIN)  IV    . dextrose 5 % and 0.9 % NaCl with KCl 20 mEq/L 100 mL/hr at 09/02/2017 2233  . lacosamide (VIMPAT) IV    . levETIRAcetam 1,500 mg (09/02/17 0825)  . potassium chloride     PRN Meds:.acetaminophen **OR** acetaminophen, bisacodyl, hydrALAZINE, nystatin, ondansetron **OR** ondansetron (ZOFRAN) IV            Technical Summary: This is a standard 16 channel EEG recording performed according to the international 10-20 electrode system.  AP bipolar, transverse bipolar, and referential montages were obtained, and digitally reformatted as necessary.  Duration of tracing: 25:41  Description: Pt is noted to be minimally responsive.  On video she appears drowsy and non-verbal, but will intermittently open eyes and orient toward voice, with occasionally yawning.  Background is characterized as diffusely slow and disorganized.  There  is a 4 Hz theta rhythm that is posterior predominant.  Frequent Lt temporparietal (T5/C3/P3) spike and polyspike and wave discharges are noted, occurring every 1-2 seconds, without any definitive clinical correlate noted.  There is no well defined drowsiness or stage II sleep.  Neither HV or photic were performed.  EKG was monitored and noted to be SR at 78 bpm.  Impression: This is an abnormal EEG due to frequent spike/polyspike and wave discharges from the Lt tempoparietal region, similar to past EEG tracings, without any definite clinical correlate noted.  This finding could certainly indicate an underlying predisposition towards seizures and ongoing treatment with an anticonvulsant agent would be recommended.  Additionally, background slowing was seen throughout the tracing which is a non-specific finding that can be seen with toxic, metabolic, diffuse, or multifocal structural processes.  Continous EEG recording may be useful if intermittent non-convulsive seizure activity is suspected, especially with use of a cephalosporin which can sometimes be associated with nonconvulsive seizures.  Clinical correlation advised.   Carvel Getting, M.D. Neurology Cell [phone]

## 2017-09-03 ENCOUNTER — Other Ambulatory Visit: Payer: Self-pay

## 2017-09-03 LAB — CBC WITH DIFFERENTIAL/PLATELET
ABS IMMATURE GRANULOCYTES: 0 10*3/uL (ref 0.0–0.1)
BASOS ABS: 0 10*3/uL (ref 0.0–0.1)
BASOS PCT: 1 %
Eosinophils Absolute: 0.1 10*3/uL (ref 0.0–0.7)
Eosinophils Relative: 1 %
HCT: 43.2 % (ref 36.0–46.0)
Hemoglobin: 14.8 g/dL (ref 12.0–15.0)
Immature Granulocytes: 0 %
Lymphocytes Relative: 24 %
Lymphs Abs: 1.9 10*3/uL (ref 0.7–4.0)
MCH: 31.1 pg (ref 26.0–34.0)
MCHC: 34.3 g/dL (ref 30.0–36.0)
MCV: 90.8 fL (ref 78.0–100.0)
MONO ABS: 0.7 10*3/uL (ref 0.1–1.0)
Monocytes Relative: 8 %
NEUTROS PCT: 66 %
Neutro Abs: 5.5 10*3/uL (ref 1.7–7.7)
PLATELETS: 287 10*3/uL (ref 150–400)
RBC: 4.76 MIL/uL (ref 3.87–5.11)
RDW: 12.3 % (ref 11.5–15.5)
WBC: 8.2 10*3/uL (ref 4.0–10.5)

## 2017-09-03 LAB — GLUCOSE, CAPILLARY
GLUCOSE-CAPILLARY: 136 mg/dL — AB (ref 70–99)
GLUCOSE-CAPILLARY: 140 mg/dL — AB (ref 70–99)
GLUCOSE-CAPILLARY: 142 mg/dL — AB (ref 70–99)
GLUCOSE-CAPILLARY: 150 mg/dL — AB (ref 70–99)
GLUCOSE-CAPILLARY: 158 mg/dL — AB (ref 70–99)
Glucose-Capillary: 140 mg/dL — ABNORMAL HIGH (ref 70–99)

## 2017-09-03 LAB — BASIC METABOLIC PANEL
Anion gap: 11 (ref 5–15)
CO2: 23 mmol/L (ref 22–32)
Calcium: 9 mg/dL (ref 8.9–10.3)
Chloride: 104 mmol/L (ref 98–111)
Creatinine, Ser: 0.37 mg/dL — ABNORMAL LOW (ref 0.44–1.00)
Glucose, Bld: 156 mg/dL — ABNORMAL HIGH (ref 70–99)
POTASSIUM: 2.9 mmol/L — AB (ref 3.5–5.1)
SODIUM: 138 mmol/L (ref 135–145)

## 2017-09-03 LAB — MAGNESIUM: MAGNESIUM: 1.6 mg/dL — AB (ref 1.7–2.4)

## 2017-09-03 MED ORDER — PHENOBARBITAL SODIUM 65 MG/ML IJ SOLN
65.0000 mg | Freq: Every day | INTRAMUSCULAR | Status: DC
  Administered 2017-09-03 – 2017-09-11 (×9): 65 mg via INTRAVENOUS
  Filled 2017-09-03 (×9): qty 1

## 2017-09-03 MED ORDER — POTASSIUM CHLORIDE 10 MEQ/100ML IV SOLN
10.0000 meq | INTRAVENOUS | Status: AC
  Administered 2017-09-03: 10 meq via INTRAVENOUS
  Filled 2017-09-03 (×4): qty 100

## 2017-09-03 MED ORDER — ACETAMINOPHEN 650 MG RE SUPP
650.0000 mg | Freq: Four times a day (QID) | RECTAL | Status: DC
  Administered 2017-09-03 – 2017-09-05 (×7): 650 mg via RECTAL
  Filled 2017-09-03 (×7): qty 1

## 2017-09-03 MED ORDER — ACETAMINOPHEN 325 MG PO TABS: 650.0000 mg | ORAL_TABLET | Freq: Four times a day (QID) | ORAL | Status: DC

## 2017-09-03 NOTE — Progress Notes (Signed)
PROGRESS NOTE  Ebony Williams DGL:[id number] DOB: [birth year] DOA: 08/12/2017 PCP: Sharilyn Sites, MD  HPI/Recap of past 25 hours: 67 year old female with medical history significant for recent large MCA distribution stroke in 3/19, with subsequent seizures, hypertension, hyperlipidemia, recent Klebsiella UTI currently on outpatient treatment, presented to the ER from skilled facility due to worsening mental status and lethargy for the past 2 days prior to arrival.  Of note, patient has had multiple admissions since March after her stroke, the last admission was due to Klebsiella UTI of which she was started on Keflex, but never completed due to altered mental status.  In the ED, CT head was negative for any acute intracranial abnormalities.  Patient admitted for further work-up by neurologist and IV treatment of UTI.  Of note, daughter reported that patient was able to have little conversations, and used a wheelchair for ambulation, reports that she had a sudden change of mental status over the past 2 days.  Also has recorded video of patient having what seems to be like ?seizures vs myoclonic jerks.   Today, patient is obtunded although responds to pain.  Unable to perform ROS.  Discussed with the daughter extensively about overall medical management. Neurologist on board, also extensively discussed with daughter about adding phenobarbital to her seizure meds, due to ongoing ?status epilepticus seen on 24H EEG. Daughter in agreement and is aware that pt will likely be intubated and may have a difficult extubation. Code status reversed. 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   Dysphagia, post-stroke   Diastolic dysfunction   Benign essential HTN   Spastic hemiplegia of right dominant side as late effect of cerebral  infarction [facility])   Palliative care by specialist   Goals of care, counseling/discussion   DNR (do not resuscitate) discussion   UTI due to Klebsiella species   Seizure, late effect of stroke ([place])  Acute metabolic encephalopathy Likely due to ?recurrent seizures Vs ?extension of CVA, ?UTI/sepsis Afebrile, no leukocytosis Lactic acid within normal limits BC x2 NGTD Chest x-ray unremarkable CT head with no acute intracranial abnormalities N.p.o., IV fluids Telemetry  UTI secondary to Klebsiella UC grew >100,000 pansensitive Klebsiella Continue IV Rocephin  Recurrent seizure LTM EEG: Frequent near continuous left parietal spike and polyspike and wave discharges as well as left hemispheric periodic lateralized epileptiform discharges present throughout the recording. All these findings are suggestive of significant cortical irritability in the left posterior cortex. Neurology on board: Continue LTM EEG, vimpat + keppra + dilantin, starting phenobarbital Hold off on MRI for now as encephalopathy likely due to recurrent seizures Transfer to SDU for closer monitoring, frequent neurochecks Seizure/fall precautions  History of large CVA in 3/19 Occlusion of left middle cerebral artery status post IV TPA Currently wheelchair-bound, in the skilled facility Hold off MRI Neurology on board Hold home aspirin, Plavix, statins due to n.p.o. status  Hypokalemia Replace PRN  Dysphasia status post stroke Due to encephalopathy, will keep patient n.p.o. SLP on board  DM type II Controlled Last A1c 07/2017 was 5.1 CBGs Q4H due to NPO status Hold home metformin, may discontinue upon discharge  Hypertension IV hydralazine PRN Hold Home lisinopril, amlodipine  Hyperlipidemia LDL 107 in 04/2017 Hold home statins for now  [facility] Palliative care consulted    Code Status: DNR reversed to full as discussed above  Family Communication: Discussed extensively with daughter  Disposition  Plan: To be determined   Consultants:  Neurology  Palliative care  Procedures:  None  Antimicrobials:  IV Rocephin  DVT prophylaxis: Lovenox   Objective: Vitals:   09/03/17 0344 09/03/17 0500 09/03/17 0740 09/03/17 1211  BP: (!) 166/82  (!) 176/79 (!) 170/80  Pulse: 90  80 80  Resp: 18  20 20   Temp: 99 F (37.2 C)  97.9 F (36.6 C) 97.8 F (36.6 C)  TempSrc: Oral   Oral  SpO2: 98%  98% 99%  Weight:  70.3 kg (154 lb 15.7 oz)    Height:        Intake/Output Summary (Last 24 hours) at 09/03/2017 1646 Last data filed at 09/03/2017 0600 Gross per 24 hour  Intake 2096.69 ml  Output 1300 ml  Net 796.69 ml   Filed Weights   08/08/2017 1821 09/02/17 0500 09/03/17 0500  Weight: 66.6 kg (146 lb 13.2 oz) 68.1 kg (150 lb 2.1 oz) 70.3 kg (154 lb 15.7 oz)    Exam:   General: Obtunded  Cardiovascular: S1, S2 present  Respiratory: CTA B  Abdomen: Soft, nontender, nondistended, bowel sounds present  Musculoskeletal: No pedal edema bilaterally  Skin: Normal  Psychiatry: Unable to assess  Neurology: Severe residual right hemiparesis, currently unable to follow commands   Data Reviewed: CBC: Recent Labs  Lab 09/05/2017 1247 09/02/17 0633 09/03/17 0710  WBC 8.2 8.1 8.2  NEUTROABS  --   --  5.5  HGB 14.8 14.5 14.8  HCT 43.9 42.7 43.2  MCV 93.6 92.2 90.8  PLT 269 261 277   Basic Metabolic Panel: Recent Labs  Lab 09/02/2017 1419 08/30/2017 2353 09/02/17 0633 09/03/17 0710  NA 141  --  142 138  K 3.3*  --  2.9* 2.9*  CL 107  --  106 104  CO2 22  --  24 23  GLUCOSE 104*  --  149* 156*  BUN 15  --  9 <5*  CREATININE 0.49  --  0.42* 0.37*  CALCIUM 9.0  --  9.0 9.0  MG  --  1.8  --  1.6*   GFR: Estimated Creatinine Clearance: 61 mL/min (A) (by C-G formula based on SCr of 0.37 mg/dL (L)). Liver Function Tests: Recent Labs  Lab 08/19/2017 1419  AST 25  ALT 19  ALKPHOS 52  BILITOT 1.2  PROT 6.4*  ALBUMIN 3.6   No results for input(s): LIPASE,  AMYLASE in the last 168 hours. No results for input(s): AMMONIA in the last 168 hours. Coagulation Profile: No results for input(s): INR, PROTIME in the last 168 hours. Cardiac Enzymes: No results for input(s): CKTOTAL, CKMB, CKMBINDEX, TROPONINI in the last 168 hours. BNP (last 3 results) No results for input(s): PROBNP in the last 8760 hours. HbA1C: No results for input(s): HGBA1C in the last 72 hours. CBG: Recent Labs  Lab 09/03/17 0024 09/03/17 0342 09/03/17 0839 09/03/17 1144 09/03/17 1635  GLUCAP 150* 140* 140* 158* 136*   Lipid Profile: No results for input(s): CHOL, HDL, LDLCALC, TRIG, CHOLHDL, LDLDIRECT in the last 72 hours. Thyroid Function Tests: No results for input(s): TSH, T4TOTAL, FREET4, T3FREE, THYROIDAB in the last 72 hours. Anemia Panel: No results for input(s): VITAMINB12, FOLATE, FERRITIN, TIBC, IRON, RETICCTPCT in the last 72 hours. Urine analysis:    Component Value Date/Time   COLORURINE YELLOW 08/10/2017 [place] 08/24/2017 1234   LABSPEC 1.015 08/27/2017 1234   PHURINE 5.0 09/04/2017 1234  GLUCOSEU NEGATIVE 08/23/2017 [place] 09/03/2017 [place] 08/12/2017 1234   KETONESUR 80 (A) 08/27/2017 1234   PROTEINUR 30 (A) 08/08/2017 1234   NITRITE NEGATIVE 08/15/2017 1234   LEUKOCYTESUR NEGATIVE 08/07/2017 1234   Sepsis Labs: @LABRCNTIP (procalcitonin:4,lacticidven:4)  ) Recent Results (from the past 240 hour(s))  MRSA PCR Screening     Status: None   Collection Time: 08/08/2017 11:27 PM  Result Value Ref Range Status   MRSA by PCR NEGATIVE NEGATIVE Final    Comment:        The GeneXpert MRSA Assay (FDA approved for NASAL specimens only), is one component of a comprehensive MRSA colonization surveillance program. It is not intended to diagnose MRSA infection nor to guide or monitor treatment for MRSA infections. Performed at [facility], [place] [address] [place], [place] [address]     Culture, blood (routine x 2)     Status: None (Preliminary result)   Collection Time: 09/02/17 12:00 PM  Result Value Ref Range Status   Specimen Description BLOOD LEFT HAND  Final   Special Requests   Final    BOTTLES DRAWN AEROBIC ONLY Blood Culture adequate volume   Culture   Final    NO GROWTH 1 DAY Performed at [facility], [place] [address], [place], [place] [address]    Report Status PENDING  Incomplete  Culture, blood (routine x 2)     Status: None (Preliminary result)   Collection Time: 09/02/17 12:05 PM  Result Value Ref Range Status   Specimen Description BLOOD RIGHT HAND  Final   Special Requests   Final    BOTTLES DRAWN AEROBIC ONLY Blood Culture adequate volume   Culture   Final    NO GROWTH 1 DAY Performed at [facility], [place] [address] [place], [place] [address]    Report Status PENDING  Incomplete      Studies: No results found.  Scheduled Meds: . acetaminophen  650 mg Rectal Q6H   Or  . acetaminophen  650 mg Oral Q6H  . enoxaparin (LOVENOX) injection  40 mg Subcutaneous Q24H  . ketorolac  30 mg Intravenous Q6H  . PHENObarbital  65 mg Intravenous QHS  . phenytoin (DILANTIN) IV  100 mg Intravenous Q8H  . sodium chloride flush  3 mL Intravenous Q12H    Continuous Infusions: . cefTRIAXone (ROCEPHIN)  IV 1 g (09/03/17 1140)  . dextrose 5 % and 0.9 % NaCl with KCl 20 mEq/L 100 mL/hr at 09/03/17 0909  . lacosamide (VIMPAT) IV 200 mg (09/03/17 1019)  . levETIRAcetam 1,500 mg (09/03/17 0905)     LOS: 2 days     Alma Friendly, MD [facility]   If 7PM-7AM, please contact night-coverage [email or web address] Password [facility] 09/03/2017, 4:46 PM

## 2017-09-03 NOTE — Progress Notes (Signed)
   09/03/17 1400  Clinical Encounter Type  Visited With Patient;Patient and family together  Visit Type Initial  Referral From Nurse  Consult/Referral To Chaplain  Spiritual Encounters  Spiritual Needs Emotional  Stress Factors  Patient Stress Factors Exhausted    Pt was laying on her bed seemingly deep asleep. [name] on-site and tearful. [name] talked about poor family dynamics and little support. Chaplain provided empathic reflective listening, compassionate presence and prayer.  Damika Harmon a Medical sales representative, [employer]

## 2017-09-03 NOTE — Progress Notes (Signed)
  Speech Language Pathology  Patient Details Name: Ebony Williams MRN: [id number] DOB: 20-Jan-1951 Today's Date: 09/03/2017 Time:  -      Pt is lethargic, not arousable per SLP colleague who is familiar with pt and family. Will cancel for today and continue efforts.   Orbie Pyo Anderson.Ed [employer] [phone]

## 2017-09-03 NOTE — Progress Notes (Signed)
LTM EEG checked, no skin breakdown seen. At some point overnight pt pulled off Fp1, Fp2 and F7 electrodes. Electrodes were reapplied.

## 2017-09-03 NOTE — Progress Notes (Signed)
MEDICATION RELATED CONSULT NOTE - INITIAL   Pharmacy Consult for phenytoin Indication: seizures  Allergies  Allergen Reactions  . Sulfa Antibiotics Diarrhea and Itching    Patient Measurements: Height: 5\' 2"  (157.5 cm) Weight: 154 lb 15.7 oz (70.3 kg) IBW/kg (Calculated) : 50.1 Adjusted Body Weight: 58 kg  Vital Signs: Temp: 97.8 F (36.6 C) (07/29 1211) Temp Source: Oral (07/29 1211) BP: 170/80 (07/29 1211) Pulse Rate: 80 (07/29 1211) Intake/Output from previous day: 07/28 0701 - 07/29 0700 In: 3002.6 [I.V.:2707.6; IV Piggyback:295] Out: 1300 [Urine:1300] Intake/Output from this shift: No intake/output data recorded.  Labs: Recent Labs    08/19/2017 1247 08/31/2017 1419 08/14/2017 2353 09/02/17 0633 09/03/17 0710  WBC 8.2  --   --  8.1 8.2  HGB 14.8  --   --  14.5 14.8  HCT 43.9  --   --  42.7 43.2  PLT 269  --   --  261 287  CREATININE  --  0.49  --  0.42* 0.37*  MG  --   --  1.8  --  1.6*  ALBUMIN  --  3.6  --   --   --   PROT  --  6.4*  --   --   --   AST  --  25  --   --   --   ALT  --  19  --   --   --   ALKPHOS  --  52  --   --   --   BILITOT  --  1.2  --   --   --    Estimated Creatinine Clearance: 61 mL/min (A) (by C-G formula based on SCr of 0.37 mg/dL (L)).   Medical History: Past Medical History:  Diagnosis Date  . History of kidney stones    x1  . Hypertension     Medications:  Medications Prior to Admission  Medication Sig Dispense Refill Last Dose  . acetaminophen (TYLENOL) 325 MG tablet Take 2 tablets (650 mg total) by mouth every 6 (six) hours as needed for mild pain, fever or headache. (Patient taking differently: Take 650 mg by mouth every 6 (six) hours as needed for fever (general discomfort). )   unknown  . ALPRAZolam (XANAX) 0.5 MG tablet Take 1 tablet (0.5 mg total) by mouth 3 (three) times daily as needed for anxiety or sleep. (Patient taking differently: Take 0.5 mg by mouth every 8 (eight) hours as needed for anxiety or sleep. ) 30  tablet 0 08/26/2017 at 1324  . amantadine (SYMMETREL) 100 MG capsule Take 2 capsules (200 mg total) by mouth 2 (two) times daily. 10 capsule 0 08/30/2017 at 1700  . amLODipine (NORVASC) 10 MG tablet Take 1 tablet (10 mg total) by mouth daily.   08/30/2017 at 900  . aspirin 81 MG chewable tablet Chew 1 tablet (81 mg total) by mouth daily.   08/30/2017 at 900  . atorvastatin (LIPITOR) 40 MG tablet Take 1 tablet (40 mg total) by mouth daily.   08/30/2017 at 900  . cephALEXin (KEFLEX) 500 MG capsule Take 500 mg by mouth every 12 (twelve) hours. 7 day course (for UTI) started 08/28/17 pm   08/30/2017 at 2100  . clopidogrel (PLAVIX) 75 MG tablet Take 1 tablet (75 mg total) by mouth daily.   08/30/2017 at 900  . FLUoxetine (PROZAC) 40 MG capsule Take 1 capsule (40 mg total) by mouth at bedtime.  3 08/30/2017 at 2100  . levETIRAcetam (KEPPRA) 100 MG/ML  solution Take 1,500 mg by mouth 2 (two) times daily.   08/30/2017 at 1700  . lisinopril (PRINIVIL,ZESTRIL) 5 MG tablet Take 5 mg by mouth daily.   08/30/2017 at 900  . metFORMIN (GLUCOPHAGE) 500 MG tablet Take 1 tablet (500 mg total) by mouth daily with breakfast.   08/30/2017 at 900  . nystatin (NYSTATIN) powder Apply topically See admin instructions. Apply topically to groin every 12 hours as needed for rash/irritation   unknown  . ondansetron (ZOFRAN) 4 MG tablet Take 4 mg by mouth every 8 (eight) hours as needed for nausea or vomiting.   08/30/2017 at 2024  . pantoprazole (PROTONIX) 40 MG tablet Take 1 tablet (40 mg total) by mouth daily.   08/30/2017 at 900  . polyethylene glycol (MIRALAX / GLYCOLAX) packet Take 17 g by mouth daily. Mix with 8-10 oz water and drink   08/30/2017 at 900  . potassium chloride SA (K-DUR,KLOR-CON) 20 MEQ tablet Take 20 mEq by mouth daily.   08/30/2017 at 900  . PRESCRIPTION MEDICATION See admin instructions. Administer normal saline 1000 ml @@ 25 ml/hr via clysis every shift (day, evening, night)   08/31/2017 at night  . traMADol (ULTRAM) 50  MG tablet Take 1 tablet (50 mg total) by mouth every 8 (eight) hours as needed for severe pain. (Patient taking differently: Take 50 mg by mouth See admin instructions. Take one tablet (50 mg) by mouth every 12 hours, may also take one tablet every 8 hours as needed for severe pain) 15 tablet 0 08/30/2017 at 2100  . feeding supplement, GLUCERNA SHAKE, (GLUCERNA SHAKE) LIQD Take 237 mLs by mouth 3 (three) times daily between meals. (Patient not taking: Reported on 08/22/2017)   Not Taking at Unknown time  . levETIRAcetam (KEPPRA) 750 MG tablet Take 2 tablets (1,500 mg total) by mouth 2 (two) times daily. (Patient not taking: Reported on 08/09/2017)   Not Taking at Unknown time    Assessment: 67 y/o female with large MCA distribution stroke in 04/2017 and resultant seizures admitted 08/09/2017 with AMS. Continuous EEG monitoring in progress and clinical picture most consistent with ongoing seizure activity. Pharmacy consulted to manage phenytoin inpatient.  PTA regimen: Keppra 1500 mg PO bid  Inpatient: Continue Keppra 1500 mg IV q12h from PTA Added Vimpat 200 mg IV q12h on 7/28 Added fosphenytoin 1332 mg IV (20 mg/kg) load on 7/27 then phenytoin 100 mg IV q8h on 7/28 Added phenobarbital 65 mg IV qhs on 7/29  7/28 ~24h post-load phenytoin level 22.2 (albumin 3.6 and normal) and just above goal  SCr 0.3-0.4s, CrCl ~61 ml/min.  Goal of Therapy:  Total level 10-20 mcg/ml Free level 1-2 mcg/ml  Plan:  - Continue phenytoin 100 mg IV q8h (~5 mg/kg/day based on ABW), dose is appropriate - Check total phenytoin trough 5-7 days after initiation (between 8/1-8/3) and adjust dose based on level - Monitor renal function and albumin level   Renold Genta, PharmD, BCPS Clinical Pharmacist Clinical phone for 09/03/2017 until 3p is [phone] Please check AMION for all Pharmacist numbers by unit 09/03/2017 3:05 PM

## 2017-09-03 NOTE — Procedures (Signed)
Electroencephalography report.  Long-term monitoring  Data acquisition: International 10-20 for eligible placement.  18 channels EEG with additional EKG channel  Recording begins 09/02/2017 at 11:23 AM Recording and 09/03/2017 at 7:30 AM  Number of study day 1  CPT 95951  This intensive EEG monitoring with simultaneous video monitoring to provide this patient with history of stroke and now cognitive and functional decline to rule out clinical and subclinical electrographic seizures  There was one pushbutton activation events however appeared to be an accident.  Background activities marked by disorganized background activity slowing ranging between 5 to 6 cps with me with admixed faster frequencies as well as delta slowing throughout the recording without posterior dominant rhythm.  Superimposed near continues left posterior polyspike and wave discharges present at times was brought negative field and periodic fashion suggestive of left lateralizing periodic  epileptiform discharges.  At times this left posterior polyspike and wave discharges become rhythmic synchronized with evolving features in morphology frequency amplitude and extension of negative field in a right parieto-occipital cortex consistent with electrographic seizures.  During second half of the recording background activities obscured by significant electrode artifact however when there is a time with a relative absence of electrode artifact it appears that patient continue to have electrographic seizures as discussed above.  There was no obvious clinical correlate to this electrographic seizures.  Clinical interpretation: This 20 hours of intensive EEG monitoring with simultaneous video monitoring recorded multiple largely subclinical electrographic seizures onset likely in the left posterior cortex with propagation into the right parieto-occipital cortex at times.  Frequent near continuous left parietal spike and polyspike and wave  discharges as well as left hemispheric periodic lateralized epileptiform discharges present throughout the recording.  All these findings suggestive of significant cortical irritability in the left posterior cortex.  Clinical correlation is advised.

## 2017-09-03 NOTE — Consult Note (Signed)
Consultation Note Date: 09/03/2017   Patient Name: Ebony Williams  DOB: Nov 29, 1950  MRN: [id number]  Age / Sex: 67 y.o., female  PCP: Sharilyn Sites, MD Referring Physician: Alma Friendly, MD  Reason for Consultation: Establishing goals of care  HPI/Patient Profile: 67 y.o. 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   Clinical Assessment and Goals of Care: I met today at Ms. Backer bedside with her daughter, Marcene Brawn. Marcene Brawn is tearful and struggling with seeing her mother so agitated. Marcene Brawn tells me about her mother and family. 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 She has always taken care of others including her husband of 46 yrs (Kara's stepfather - Marcene Brawn says he has some dementia). Marcene Brawn has 1 brother but he is not involved and Ms. Perno has been raising her grandson who is now 70 yo. She also used to watch Kara's children (1 and 62 yo) when [place] works.   Marcene Brawn says that they have struggled ever since her mother's stroke in March. She says that her mother was interacting previously but has been becoming more confused ever since [facility]. Marcene Brawn talks of wanting her mother "to be okay" and "to be comfortable."   We further discussed DNR (RN noted that daughter questioned this) and we discussed expectations of resuscitation and that this would mean a likely further decline in her mother's QOL. Reviewed electronic record of Advance Directive that she would not want these measures. Marcene Brawn agrees with DNR after our discussion.  Marcene Brawn confirmed that they have had some discussions and she knows that her mother would not want to be lying in bed without QOL. Marcene Brawn  just desperately wants her mother back.   We also discussed the difference in DNR and resuscitation vs temporary intubation with hopes to better manage her seizures medically.   Emotional support provided.   Primary Decision Maker HCPOA husband (hospitalized himself currently) and daughter Marcene Brawn are HCPOA    SUMMARY OF RECOMMENDATIONS   - DNR confirmed - Emotional support provided - Will continue to follow  Code Status/Advance Care Planning:  DNR   Symptom Management:   Agitation: Scheduled tylenol and toradol. SHE DOES TAKE XANAX AS NEEDED.   Will need medication to assist with agitation in order to complete MRI.   Daughter requesting medication for yeast infection (has noticed her mother reaching to scratch and says her mother always gets yeast infection with antibiotics).   Palliative Prophylaxis:   Aspiration, Bowel Regimen, Delirium Protocol, Frequent Pain Assessment, Oral Care and Turn Reposition  Additional Recommendations (Limitations, Scope, Preferences):  Full Scope Treatment  Psycho-social/Spiritual:   Desire for further Chaplaincy support:yes  Additional Recommendations: Caregiving  Support/Resources and Grief/Bereavement Support  Prognosis:   TBD. Overall prognosis appears poor given neurological decline and overall decline since  stroke in March 2019.   Discharge Planning: To Be Determined      Primary Diagnoses: Present on Admission: . Metabolic encephalopathy . 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 Benign essential HTN . UTI due to Klebsiella species   I have reviewed the medical record, interviewed the patient and family, and examined the patient. The following aspects are pertinent.  Past Medical History:  Diagnosis Date  . History of kidney stones    x1  . Hypertension    Social History   Socioeconomic History  . Marital status: Married    Spouse name: Not on file  . Number of  children: Not on file  . Years of education: Not on file  . Highest education level: Not on file  Occupational History  . Not on file  Social Needs  . Financial resource strain: Not on file  . Food insecurity:    Worry: Not on file    Inability: Not on file  . Transportation needs:    Medical: Not on file    Non-medical: Not on file  Tobacco Use  . Smoking status: Never Smoker  . Smokeless tobacco: Never Used  Substance and Sexual Activity  . Alcohol use: Yes    Comment: very rare  . Drug use: No  . Sexual activity: Not on file  Lifestyle  . Physical activity:    Days per week: Not on file    Minutes per session: Not on file  . Stress: Not on file  Relationships  . Social connections:    Talks on phone: Not on file    Gets together: Not on file    Attends religious service: Not on file    Active member of club or organization: Not on file    Attends meetings of clubs or organizations: Not on file    Relationship status: Not on file  Other Topics Concern  . Not on file  Social History Narrative  . Not on file   Family History  Problem Relation Age of Onset  . Diabetes Mother   . Hypertension Mother    Scheduled Meds: . enoxaparin (LOVENOX) injection  40 mg Subcutaneous Q24H  . ketorolac  30 mg Intravenous Q6H  . phenytoin (DILANTIN) IV  100 mg Intravenous Q8H  . sodium chloride flush  3 mL Intravenous Q12H   Continuous Infusions: . cefTRIAXone (ROCEPHIN)  IV 1 g (09/03/17 1140)  . dextrose 5 % and 0.9 % NaCl with KCl 20 mEq/L 100 mL/hr at 09/03/17 0909  . lacosamide (VIMPAT) IV 200 mg (09/03/17 1019)  . levETIRAcetam 1,500 mg (09/03/17 0905)  . potassium chloride 10 mEq (09/03/17 1149)   PRN Meds:.acetaminophen **OR** acetaminophen, bisacodyl, hydrALAZINE, nystatin, ondansetron **OR** ondansetron (ZOFRAN) IV Allergies  Allergen Reactions  . Sulfa Antibiotics Diarrhea and Itching   Review of Systems  Unable to perform ROS: Mental status change     Physical Exam  Constitutional: She appears well-developed. She has a sickly appearance. She appears ill.  HENT:  Head: Normocephalic and atraumatic.  Cardiovascular: Normal rate.  Pulmonary/Chest: Effort normal. No accessory muscle usage. No tachypnea. No respiratory distress.  Abdominal: Normal appearance.  Neurological: She is disoriented.  Arousable but confused and not making eye contact or following commands. Will make some purposeful movement inconsistently (i.e. to reach to scratch her face or rub her eye)   Nursing note and vitals reviewed.   Vital Signs: BP (!) 170/80 (BP Location: Right  Arm)   Pulse 80   Temp 97.8 F (36.6 C) (Oral)   Resp 20   Ht '5\' 2"'$  (1.575 m)   Wt 70.3 kg (154 lb 15.7 oz)   SpO2 99%   BMI 28.35 kg/m  Pain Scale: PAINAD   Pain Score: Asleep   SpO2: SpO2: 99 % O2 Device:SpO2: 99 % O2 Flow Rate: .   IO: Intake/output summary:   Intake/Output Summary (Last 24 hours) at 09/03/2017 1237 Last data filed at 09/03/2017 0600 Gross per 24 hour  Intake 2096.69 ml  Output 1300 ml  Net 796.69 ml    LBM:   Baseline Weight: Weight: 67.6 kg (149 lb) Most recent weight: Weight: 70.3 kg (154 lb 15.7 oz)     Palliative Assessment/Data: 20%    Time Total: 75 min  Greater than 50%  of this time was spent counseling and coordinating care related to the above assessment and plan.  Signed by: Vinie Sill, NP Palliative Medicine Team Pager # [phone] (M-F 8a-5p) Team Phone # [phone] (Nights/Weekends)

## 2017-09-03 NOTE — Progress Notes (Signed)
Ebony Williams status has changed to Ebony Williams, per the wishes of her HCPOA.    Dr. Cheral Marker and I witnessed both the patient's husband and daughter express their wishes to suspend the Living Will on file.

## 2017-09-03 NOTE — Care Management Note (Signed)
Case Management Note  Patient Details  Name: Ebony Williams MRN: [id number] Date of Birth: 01-26-1951  Subjective/Objective:     Pt admitted with metabolic encephalopathy. She is from [facility]                Action/Plan: Awaiting palliative consult. CM following for d/c disposition.  Expected Discharge Date:                  Expected Discharge Plan:  Skilled Nursing Facility  In-House Referral:  Clinical Social Work  Discharge planning Services     Post Acute Care Choice:    Choice offered to:     DME Arranged:    DME Agency:     HH Arranged:    [place] Agency:     Status of Service:  In process, will continue to follow  If discussed at Long Length of Stay Meetings, dates discussed:    Additional Comments:  Pollie Friar, RN 09/03/2017, 11:44 AM

## 2017-09-03 NOTE — Progress Notes (Signed)
Subjective: Somnolent to obtunded today. Clinically appears to be in a state of deep sleep and is unarousable but will move LUE and LLE semipurposefully to noxious stimuli.   Objective: Current vital signs: BP (!) 170/80 (BP Location: Right Arm)   Pulse 80   Temp 97.8 F (36.6 C) (Oral)   Resp 20   Ht '5\' 2"'$  (1.575 m)   Wt 70.3 kg (154 lb 15.7 oz)   SpO2 99%   BMI 28.35 kg/m  Vital signs in last 24 hours: Temp:  [97.8 F (36.6 C)-99 F (37.2 C)] 97.8 F (36.6 C) (07/29 1211) Pulse Rate:  [79-90] 80 (07/29 1211) Resp:  [15-20] 20 (07/29 1211) BP: (144-183)/(75-102) 170/80 (07/29 1211) SpO2:  [97 %-99 %] 99 % (07/29 1211) Weight:  [70.3 kg (154 lb 15.7 oz)] 70.3 kg (154 lb 15.7 oz) (07/29 0500)  Intake/Output from previous day: 07/28 0701 - 07/29 0700 In: 3002.6 [I.V.:2707.6; IV Piggyback:295] Out: 1300 [Urine:1300] Intake/Output this shift: No intake/output data recorded. Nutritional status:  Diet Order           Diet NPO time specified Except for: Sips with Meds  Diet effective now          Neurologic Exam: Ment: Somnolent to obtunded. Clinically appears to be in a state of deep sleep and is unarousable but will move LUE and LLE semipurposefully to noxious stimuli.  CN: Right facial droop.  Motor/Sensory: Increased tone RUE and RLE. Withdraws LUE and LLE semipurposefully to noxious stimuli.   Lab Results: Results for orders placed or performed during the hospital encounter of 08/29/2017 (from the past 48 hour(s))  CBC     Status: None   Collection Time: 08/25/2017 12:47 PM  Result Value Ref Range   WBC 8.2 4.0 - 10.5 K/uL   RBC 4.69 3.87 - 5.11 MIL/uL   Hemoglobin 14.8 12.0 - 15.0 g/dL   HCT 43.9 36.0 - 46.0 %   MCV 93.6 78.0 - 100.0 fL   MCH 31.6 26.0 - 34.0 pg   MCHC 33.7 30.0 - 36.0 g/dL   RDW 12.8 11.5 - 15.5 %   Platelets 269 150 - 400 K/uL    Comment: Performed at [facility] [address] [place], [place] [address]  I-Stat CG4 Lactic Acid, ED      Status: None   Collection Time: 08/29/2017 12:54 PM  Result Value Ref Range   Lactic Acid, Venous 1.23 0.5 - 1.9 mmol/L  Comprehensive metabolic panel     Status: Abnormal   Collection Time: 08/28/2017  2:19 PM  Result Value Ref Range   Sodium 141 135 - 145 mmol/L   Potassium 3.3 (L) 3.5 - 5.1 mmol/L   Chloride 107 98 - 111 mmol/L   CO2 22 22 - 32 mmol/L   Glucose, Bld 104 (H) 70 - 99 mg/dL   BUN 15 8 - 23 mg/dL   Creatinine, Ser 0.49 0.44 - 1.00 mg/dL   Calcium 9.0 8.9 - 10.3 mg/dL   Total Protein 6.4 (L) 6.5 - 8.1 g/dL   Albumin 3.6 3.5 - 5.0 g/dL   AST 25 15 - 41 U/L   ALT 19 0 - 44 U/L   Alkaline Phosphatase 52 38 - 126 U/L   Total Bilirubin 1.2 0.3 - 1.2 mg/dL   GFR calc non Af Amer >60 >60 mL/min   GFR calc Af Amer >60 >60 mL/min    Comment: (NOTE) The eGFR has been calculated using the CKD EPI equation.  This calculation has not been validated in all clinical situations. eGFR's persistently <60 mL/min signify possible Chronic Kidney Disease.    Anion gap 12 5 - 15    Comment: Performed at [facility] [address] [place], [place] [address]  I-Stat CG4 Lactic Acid, ED     Status: None   Collection Time: 08/09/2017  2:36 PM  Result Value Ref Range   Lactic Acid, Venous 1.07 0.5 - 1.9 mmol/L  MRSA PCR Screening     Status: None   Collection Time: 08/23/2017 11:27 PM  Result Value Ref Range   MRSA by PCR NEGATIVE NEGATIVE    Comment:        The GeneXpert MRSA Assay (FDA approved for NASAL specimens only), is one component of a comprehensive MRSA colonization surveillance program. It is not intended to diagnose MRSA infection nor to guide or monitor treatment for MRSA infections. Performed at [facility], [place] [address], [place], [place] [address]   Magnesium     Status: None   Collection Time: 08/09/2017 11:53 PM  Result Value Ref Range   Magnesium 1.8 1.7 - 2.4 mg/dL    Comment: Performed at [facility], [place] [address], [place], [place]  [address]  Basic metabolic panel     Status: Abnormal   Collection Time: 09/02/17  6:33 AM  Result Value Ref Range   Sodium 142 135 - 145 mmol/L   Potassium 2.9 (L) 3.5 - 5.1 mmol/L   Chloride 106 98 - 111 mmol/L   CO2 24 22 - 32 mmol/L   Glucose, Bld 149 (H) 70 - 99 mg/dL   BUN 9 8 - 23 mg/dL   Creatinine, Ser 0.42 (L) 0.44 - 1.00 mg/dL   Calcium 9.0 8.9 - 10.3 mg/dL   GFR calc non Af Amer >60 >60 mL/min   GFR calc Af Amer >60 >60 mL/min    Comment: (NOTE) The eGFR has been calculated using the CKD EPI equation. This calculation has not been validated in all clinical situations. eGFR's persistently <60 mL/min signify possible Chronic Kidney Disease.    Anion gap 12 5 - 15    Comment: Performed at [facility] [address] [place], [place] [address]  CBC     Status: None   Collection Time: 09/02/17  6:33 AM  Result Value Ref Range   WBC 8.1 4.0 - 10.5 K/uL   RBC 4.63 3.87 - 5.11 MIL/uL   Hemoglobin 14.5 12.0 - 15.0 g/dL   HCT 42.7 36.0 - 46.0 %   MCV 92.2 78.0 - 100.0 fL   MCH 31.3 26.0 - 34.0 pg   MCHC 34.0 30.0 - 36.0 g/dL   RDW 12.4 11.5 - 15.5 %   Platelets 261 150 - 400 K/uL    Comment: Performed at [facility], [place] [address], [place],  [address]  Glucose, capillary     Status: Abnormal   Collection Time: 09/02/17 12:05 PM  Result Value Ref Range   Glucose-Capillary 154 (H) 70 - 99 mg/dL   Comment 1 Notify RN    Comment 2 Document in Chart   Phenytoin level, total     Status: Abnormal   Collection Time: 09/02/17 10:21 PM  Result Value Ref Range   Phenytoin Lvl 22.2 (H) 10.0 - 20.0 ug/mL    Comment: Performed at [facility] [address], [place], [place] [address]  Glucose, capillary     Status:  Abnormal   Collection Time: 09/03/17 12:24 AM  Result Value Ref Range   Glucose-Capillary 150 (H) 70 - 99 mg/dL   Comment 1 Notify RN    Comment 2 Document in Chart   Glucose, capillary     Status: Abnormal   Collection Time: 09/03/17   3:42 AM  Result Value Ref Range   Glucose-Capillary 140 (H) 70 - 99 mg/dL   Comment 1 Notify RN    Comment 2 Document in Chart   CBC with Differential/Platelet     Status: None   Collection Time: 09/03/17  7:10 AM  Result Value Ref Range   WBC 8.2 4.0 - 10.5 K/uL   RBC 4.76 3.87 - 5.11 MIL/uL   Hemoglobin 14.8 12.0 - 15.0 g/dL   HCT 43.2 36.0 - 46.0 %   MCV 90.8 78.0 - 100.0 fL   MCH 31.1 26.0 - 34.0 pg   MCHC 34.3 30.0 - 36.0 g/dL   RDW 12.3 11.5 - 15.5 %   Platelets 287 150 - 400 K/uL   Neutrophils Relative % 66 %   Neutro Abs 5.5 1.7 - 7.7 K/uL   Lymphocytes Relative 24 %   Lymphs Abs 1.9 0.7 - 4.0 K/uL   Monocytes Relative 8 %   Monocytes Absolute 0.7 0.1 - 1.0 K/uL   Eosinophils Relative 1 %   Eosinophils Absolute 0.1 0.0 - 0.7 K/uL   Basophils Relative 1 %   Basophils Absolute 0.0 0.0 - 0.1 K/uL   Immature Granulocytes 0 %   Abs Immature Granulocytes 0.0 0.0 - 0.1 K/uL    Comment: Performed at [facility], [address] [place], [place] [address]  Basic metabolic panel     Status: Abnormal   Collection Time: 09/03/17  7:10 AM  Result Value Ref Range   Sodium 138 135 - 145 mmol/L   Potassium 2.9 (L) 3.5 - 5.1 mmol/L   Chloride 104 98 - 111 mmol/L   CO2 23 22 - 32 mmol/L   Glucose, Bld 156 (H) 70 - 99 mg/dL   BUN <5 (L) 8 - 23 mg/dL   Creatinine, Ser 0.37 (L) 0.44 - 1.00 mg/dL   Calcium 9.0 8.9 - 10.3 mg/dL   GFR calc non Af Amer >60 >60 mL/min   GFR calc Af Amer >60 >60 mL/min    Comment: (NOTE) The eGFR has been calculated using the CKD EPI equation. This calculation has not been validated in all clinical situations. eGFR's persistently <60 mL/min signify possible Chronic Kidney Disease.    Anion gap 11 5 - 15    Comment: Performed at [facility] [address] [place], [place] [address]  Magnesium     Status: Abnormal   Collection Time: 09/03/17  7:10 AM  Result Value Ref Range   Magnesium 1.6 (L) 1.7 - 2.4 mg/dL    Comment: Performed at  [facility] [address], [place], [place] [address]  Glucose, capillary     Status: Abnormal   Collection Time: 09/03/17  8:39 AM  Result Value Ref Range   Glucose-Capillary 140 (H) 70 - 99 mg/dL  Glucose, capillary     Status: Abnormal   Collection Time: 09/03/17 11:44 AM  Result Value Ref Range   Glucose-Capillary 158 (H) 70 - 99 mg/dL    Recent Results (from the past 240 hour(s))  Urine culture     Status: Abnormal   Collection Time: 08/24/17  4:36 PM  Result Value Ref  Range Status   Specimen Description URINE, CATHETERIZED  Final   Special Requests   Final    NONE Performed at [facility], [address], [place], [place] [address]    Culture >=100,000 COLONIES/mL KLEBSIELLA PNEUMONIAE (A)  Final   Report Status 08/26/2017 FINAL  Final   Organism ID, Bacteria KLEBSIELLA PNEUMONIAE (A)  Final      Susceptibility   Klebsiella pneumoniae - MIC*    AMPICILLIN RESISTANT Resistant     CEFAZOLIN <=4 SENSITIVE Sensitive     CEFTRIAXONE <=1 SENSITIVE Sensitive     CIPROFLOXACIN 0.5 SENSITIVE Sensitive     GENTAMICIN <=1 SENSITIVE Sensitive     IMIPENEM <=0.25 SENSITIVE Sensitive     NITROFURANTOIN 128 RESISTANT Resistant     TRIMETH/SULFA <=20 SENSITIVE Sensitive     AMPICILLIN/SULBACTAM 4 SENSITIVE Sensitive     PIP/TAZO <=4 SENSITIVE Sensitive     Extended ESBL NEGATIVE Sensitive     * >=100,000 COLONIES/mL KLEBSIELLA PNEUMONIAE  MRSA PCR Screening     Status: None   Collection Time: 08/19/2017 11:27 PM  Result Value Ref Range Status   MRSA by PCR NEGATIVE NEGATIVE Final    Comment:        The GeneXpert MRSA Assay (FDA approved for NASAL specimens only), is one component of a comprehensive MRSA colonization surveillance program. It is not intended to diagnose MRSA infection nor to guide or monitor treatment for MRSA infections. Performed at [facility], [place] [address], [place], [place] [address]     Lipid Panel No results for input(s): CHOL,  TRIG, HDL, CHOLHDL, VLDL, LDLCALC in the last 72 hours.  Studies/Results: Dg Chest 2 View  Result Date: 08/31/2017 CLINICAL DATA:  Altered mental status EXAM: CHEST - 2 VIEW COMPARISON:  08/24/2017 chest radiograph. FINDINGS: Stable cardiomediastinal silhouette with top-normal heart size. No pneumothorax. No pleural effusion. Stable smooth mild lateral left pleural thickening. No pulmonary edema. No acute consolidative airspace disease. IMPRESSION: No acute cardiopulmonary disease. Stable mild smooth lateral left pleural thickening. Electronically Signed   By: Ilona Sorrel M.D.   On: 08/12/2017 13:37   Ct Head Wo Contrast  Result Date: 08/24/2017 CLINICAL DATA:  67 year old female with altered level of consciousness. EXAM: CT HEAD WITHOUT CONTRAST TECHNIQUE: Contiguous axial images were obtained from the base of the skull through the vertex without intravenous contrast. COMPARISON:  08/24/2017 MR and CT, and prior studies FINDINGS: Brain: No evidence of acute infarction, hemorrhage, hydrocephalus, extra-axial collection or mass lesion/mass effect. Mild atrophy, chronic small-vessel white matter ischemic changes and remote LEFT basal ganglia/thalamic, posterior parietal and occipital infarcts again noted. Vascular: Intracranial carotid atherosclerotic calcifications again noted. Skull: Normal. Negative for fracture or focal lesion. Sinuses/Orbits: No acute finding. Other: None. IMPRESSION: 1. No evidence of acute intracranial abnormality 2. Atrophy, chronic small-vessel white matter ischemic changes and remote LEFT-sided infarcts. Electronically Signed   By: Margarette Canada M.D.   On: 08/08/2017 16:10    Medications:  Scheduled: . enoxaparin (LOVENOX) injection  40 mg Subcutaneous Q24H  . ketorolac  30 mg Intravenous Q6H  . phenytoin (DILANTIN) IV  100 mg Intravenous Q8H  . sodium chloride flush  3 mL Intravenous Q12H   Continuous: . cefTRIAXone (ROCEPHIN)  IV 1 g (09/03/17 1140)  . dextrose 5 % and  0.9 % NaCl with KCl 20 mEq/L 100 mL/hr at 09/03/17 0909  . lacosamide (VIMPAT) IV 200 mg (09/03/17 1019)  . levETIRAcetam 1,500 mg (09/03/17 0905)  . potassium chloride  10 mEq (09/03/17 1149)    LTM EEG report from this morning (7/29): This 20 hours of intensive EEG monitoring with simultaneous video monitoring recorded multiple largely subclinical electrographic seizures onset likely in the left posterior cortex with propagation into the right parieto-occipital cortex at times.  Frequent near continuous left parietal spike and polyspike and wave discharges as well as left hemispheric periodic lateralized epileptiform discharges present throughout the recording.  All these findings suggestive of significant cortical irritability in the left posterior cortex.  Clinical correlation is advised.  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 Overall presentation is most consistent with ongoing seizure activity since Wednesday.  1. No resolution of electrographic seizure activity after addition of Vimpat to her Keppra and Dilantin.   2. Prior history of seizures.  3. History of left MCA stroke 4. LTM EEG reveals multiple largely subclinical electrographic seizures onset likely in the left posterior cortex with propagation into the right parieto-occipital cortex at times. Frequent near continuous left parietal spike and polyspike and wave discharges as well as left hemispheric periodic lateralized epileptiform discharges present throughout the recording.  All these findings are suggestive of significant cortical irritability in the left posterior cortex.   Recommendations: 1. Continue with LTM EEG.  2. Dilantin level supratherapeutic yesterday. Have put in order for pharmacy to dose.  3. Continue Vimpat and Keppra 4. Hold off on MRI brain for now as overall clinical picture most consistent with ongoing seizures rather than extension of stroke.  5. Continue treatment of  urosepsis 6. Discussed at length with her daughter and husband the risks/benefits of adding a 4th anticonvulsant to her regiment. It is felt that the best next option is addition of phenobarbital due to its effect on GABA receptors, which increases synaptic inhibition. However, this would also increase the risk of decreased respiratory drive and/or aspiration. In order to start a trial of this medication, the DNR order would need to be discontinued. Discussed at length with family the risk of possible intubation with addition of phenobarbital, as well as risk that the patient may not be extubatable if she requires mechanical ventilation. The husband and daughter, after informed consent, both agreed with MD recommendation that the DNR order should be discontinued in order to allow a trial of phenobarbital. Phenobarbital 65 mg IV qhs with first dose now has been ordered. DNR order has been discontinued per family request, which was also witnessed by the patient's nurse.  7. Continuous pulse oximetry and cardiac telemetry.  8. Q2H neuro checks.     LOS: 2 days   '@Electronically'$  signed: Dr. Kerney Elbe 09/03/2017  12:34 PM

## 2017-09-03 NOTE — Plan of Care (Signed)
Ebony Williams has been somnolent to obtunded today, arousing only to painful stimulus or movement.  She moans intermittently and consistently picks at her EEG leads.  Nursing POC:  Assess and intervene for patient comfort, enable EEG machine to record, turn Q2, educate and reassure family.

## 2017-09-04 LAB — CBC WITH DIFFERENTIAL/PLATELET
ABS IMMATURE GRANULOCYTES: 0 10*3/uL (ref 0.0–0.1)
Basophils Absolute: 0.1 10*3/uL (ref 0.0–0.1)
Basophils Relative: 1 %
EOS PCT: 1 %
Eosinophils Absolute: 0.1 10*3/uL (ref 0.0–0.7)
HEMATOCRIT: 46.2 % — AB (ref 36.0–46.0)
HEMOGLOBIN: 15.8 g/dL — AB (ref 12.0–15.0)
IMMATURE GRANULOCYTES: 0 %
LYMPHS ABS: 3.9 10*3/uL (ref 0.7–4.0)
LYMPHS PCT: 41 %
MCH: 31.4 pg (ref 26.0–34.0)
MCHC: 34.2 g/dL (ref 30.0–36.0)
MCV: 91.8 fL (ref 78.0–100.0)
Monocytes Absolute: 0.9 10*3/uL (ref 0.1–1.0)
Monocytes Relative: 9 %
NEUTROS ABS: 4.6 10*3/uL (ref 1.7–7.7)
Neutrophils Relative %: 48 %
PLATELETS: 280 10*3/uL (ref 150–400)
RBC: 5.03 MIL/uL (ref 3.87–5.11)
RDW: 12.8 % (ref 11.5–15.5)
WBC: 9.5 10*3/uL (ref 4.0–10.5)

## 2017-09-04 LAB — GLUCOSE, CAPILLARY
GLUCOSE-CAPILLARY: 122 mg/dL — AB (ref 70–99)
GLUCOSE-CAPILLARY: 124 mg/dL — AB (ref 70–99)
GLUCOSE-CAPILLARY: 128 mg/dL — AB (ref 70–99)
GLUCOSE-CAPILLARY: 194 mg/dL — AB (ref 70–99)
Glucose-Capillary: 125 mg/dL — ABNORMAL HIGH (ref 70–99)
Glucose-Capillary: 128 mg/dL — ABNORMAL HIGH (ref 70–99)
Glucose-Capillary: 133 mg/dL — ABNORMAL HIGH (ref 70–99)

## 2017-09-04 LAB — BASIC METABOLIC PANEL
ANION GAP: 9 (ref 5–15)
BUN: <5 mg/dL — ABNORMAL LOW (ref 8–23)
CALCIUM: 9.2 mg/dL (ref 8.9–10.3)
CO2: 20 mmol/L — ABNORMAL LOW (ref 22–32)
Chloride: 109 mmol/L (ref 98–111)
Creatinine, Ser: 0.4 mg/dL — ABNORMAL LOW (ref 0.44–1.00)
GFR calc Af Amer: >60 mL/min (ref >60)
Glucose, Bld: 116 mg/dL — ABNORMAL HIGH (ref 70–99)
POTASSIUM: 3 mmol/L — AB (ref 3.5–5.1)
SODIUM: 138 mmol/L (ref 135–145)

## 2017-09-04 MED ORDER — POTASSIUM CHLORIDE 10 MEQ/100ML IV SOLN
10.0000 meq | Freq: Once | INTRAVENOUS | Status: AC
  Administered 2017-09-04: 10 meq via INTRAVENOUS
  Filled 2017-09-04: qty 100

## 2017-09-04 MED ORDER — POTASSIUM CHLORIDE 10 MEQ/100ML IV SOLN
10.0000 meq | INTRAVENOUS | Status: AC
  Administered 2017-09-04 (×3): 10 meq via INTRAVENOUS
  Filled 2017-09-04 (×4): qty 100

## 2017-09-04 MED ORDER — KETOROLAC TROMETHAMINE 30 MG/ML IJ SOLN
15.0000 mg | Freq: Four times a day (QID) | INTRAMUSCULAR | Status: DC
  Administered 2017-09-04 – 2017-09-05 (×3): 15 mg via INTRAVENOUS
  Filled 2017-09-04 (×3): qty 1

## 2017-09-04 MED ORDER — KCL IN DEXTROSE-NACL 20-5-0.45 MEQ/L-%-% IV SOLN
INTRAVENOUS | Status: DC
  Administered 2017-09-04 – 2017-09-10 (×10): via INTRAVENOUS
  Filled 2017-09-04 (×15): qty 1000

## 2017-09-04 MED ORDER — WHITE PETROLATUM EX OINT
TOPICAL_OINTMENT | CUTANEOUS | Status: AC
  Administered 2017-09-04: 0.2
  Filled 2017-09-04: qty 28.35

## 2017-09-04 MED ORDER — SODIUM CHLORIDE 0.9 % IV SOLN: INTRAVENOUS | Status: DC

## 2017-09-04 MED ORDER — MAGNESIUM SULFATE 2 GM/50ML IV SOLN
2.0000 g | Freq: Once | INTRAVENOUS | Status: AC
  Administered 2017-09-04: 2 g via INTRAVENOUS
  Filled 2017-09-04: qty 50

## 2017-09-04 NOTE — Progress Notes (Addendum)
Patient continue to be somnolent to obtunded today with arousing to painful stimulus. She noted to be restless at time but improved with reposition. She did have a brief awake for a few minute with an episode of small yellow emesis. Zofran administered with improvement. B/P appears to be within parameter. Family at bedside. Will continue to provide comfort for patient as well as family. Continuous EEG at bedside. Frequent turn and oral care provided.   Ave Filter, RN

## 2017-09-04 NOTE — Procedures (Signed)
Electroencephalography report.  Long-term monitoring  Data acquisition: International 10-20 for eligible placement.  18 channels EEG with additional EKG channel  Recording begins 09/03/2017 at 07 30  AM Recording and 7/30 /2019 at 7:30 AM  Number of study day 2  CPT 95951  This intensive EEG monitoring with simultaneous video monitoring to provide this patient with history of stroke and now cognitive and functional decline to rule out clinical and subclinical electrographic seizures  Day 1 : There was one pushbutton activation events however appeared to be an accident.  Background activities marked by disorganized background activity slowing ranging between 5 to 6 cps with me with admixed faster frequencies as well as delta slowing throughout the recording without posterior dominant rhythm.  Superimposed near continues left posterior polyspike and wave discharges present at times was brought negative field and periodic fashion suggestive of left lateralizing periodic  epileptiform discharges.  At times this left posterior polyspike and wave discharges become rhythmic synchronized with evolving features in morphology frequency amplitude and extension of negative field in a right parieto-occipital cortex consistent with electrographic seizures.  During second half of the recording background activities obscured by significant electrode artifact however when there is a time with a relative absence of electrode artifact it appears that patient continue to have electrographic seizures as discussed above.  There was no obvious clinical correlate to this electrographic seizures.  Day 2: Background activities marked by 5 to 6 cps background activities with admixed slower frequencies in the delta range without well-defined posterior dominant rhythm.  Superimposed near continuous left parieto-occipital polyspike and wave discharges present throughout the recording at times negative field extending to the right  parieto-occipital cortex.  In addition frequent electrographic seizures still noted most likely with onset across left parieto-occipital cortex with evolution morphology frequency amplitude and propagation of negative field to the right parieto-occipital region.  There is no obvious clinical accompaniment.  EEG was technically difficult due to  motion artifact  Clinical interpretation: This 2 of intensive EEG monitoring with simultaneous video monitoring recorded frequent electrographic seizures likely with onset across left parieto-occipital cortex with propagation to the right parieto-occipital region.  In addition ongoing non-remitting left parieto-occipital polyspike and wave discharges present throughout the recording.  These findings suggestive of significant cortical irritability in the left parieto-occipital region.  Clinical correlation is advised.

## 2017-09-04 NOTE — Progress Notes (Signed)
SLP Cancellation Note  Patient Details Name: Ebony Williams MRN: [id number] DOB: 1950-10-10   Cancelled treatment:       Reason Eval/Treat Not Completed: Fatigue/lethargy limiting ability to participate.  Spoke with pt's daughter who is well known to this therapist from previous hospital admissions.  Daughter reports that pt remains somnolent.  Will follow up for treatment as medically appropriate.     Naudia Crosley, Selinda Orion 09/04/2017, 10:58 AM

## 2017-09-04 NOTE — Progress Notes (Signed)
LTM EEG checked, no skin breakdown seen under Fp1, Fp2, F7 or F8

## 2017-09-04 NOTE — Progress Notes (Addendum)
Subjective: Continues to be somnolent to obtunded today. Clinically appears to be in a state of deep sleep and is unarousable but will grimace occasionally.   Objective: Current vital signs: BP (!) 149/73 (BP Location: Right Arm)   Pulse (!) 116   Temp 98.8 F (37.1 C) (Axillary)   Resp 18   Ht _0  (1.575 m)   Wt 69.6 kg (153 lb 7 oz)   SpO2 98%   BMI 28.06 kg/m  Vital signs in last 24 hours: Temp:  [97.8 F (36.6 C)-99.3 F (37.4 C)] 98.8 F (37.1 C) (07/30 0800) Pulse Rate:  [80-116] 116 (07/30 0800) Resp:  [18-20] 18 (07/30 0800) BP: (149-195)/(73-105) 149/73 (07/30 1000) SpO2:  [98 %-100 %] 98 % (07/30 0800) Weight:  [69.6 kg (153 lb 7 oz)] 69.6 kg (153 lb 7 oz) (07/30 0456)  Intake/Output from previous day: 07/29 0701 - 07/30 0700 In: 225 [IV Piggyback:225] Out: 1600 [Urine:1600] Intake/Output this shift: No intake/output data recorded. Nutritional status:  Diet Order           Diet NPO time specified Except for: Sips with Meds  Diet effective now          Neurologic Exam: Ment: Somnolent to obtunded. Clinically appears to be in a state of deep sleep and is unarousable but will grimace occasionally.  CN: Right facial droop.  Motor/Sensory: Increased tone RUE and RLE. Withdraws LUE and LLE semipurposefully to noxious stimuli.   Lab Results: Results for orders placed or performed during the hospital encounter of 08/23/2017 (from the past 48 hour(s))  Culture, blood (routine x 2)     Status: None (Preliminary result)   Collection Time: 09/02/17 12:00 PM  Result Value Ref Range   Specimen Description BLOOD LEFT HAND    Special Requests      BOTTLES DRAWN AEROBIC ONLY Blood Culture adequate volume   Culture      NO GROWTH 1 DAY Performed at [facility] [address], [place], [place] [address]    Report Status PENDING   Culture, blood (routine x 2)     Status: None (Preliminary result)   Collection Time: 09/02/17 12:05 PM  Result Value Ref Range    Specimen Description BLOOD RIGHT HAND    Special Requests      BOTTLES DRAWN AEROBIC ONLY Blood Culture adequate volume   Culture      NO GROWTH 1 DAY Performed at [facility], [place] [address], [place], [place] [address]    Report Status PENDING   Glucose, capillary     Status: Abnormal   Collection Time: 09/02/17 12:05 PM  Result Value Ref Range   Glucose-Capillary 154 (H) 70 - 99 mg/dL   Comment 1 Notify RN    Comment 2 Document in Chart   Phenytoin level, total     Status: Abnormal   Collection Time: 09/02/17 10:21 PM  Result Value Ref Range   Phenytoin Lvl 22.2 (H) 10.0 - 20.0 ug/mL    Comment: Performed at [facility] [address] [place], [place] [address]  Glucose, capillary     Status: Abnormal   Collection Time: 09/03/17 12:24 AM  Result Value Ref Range   Glucose-Capillary 150 (H) 70 - 99 mg/dL   Comment 1 Notify RN    Comment 2 Document in Chart   Glucose, capillary     Status: Abnormal   Collection Time: 09/03/17  3:42 AM  Result Value Ref Range  Glucose-Capillary 140 (H) 70 - 99 mg/dL   Comment 1 Notify RN    Comment 2 Document in Chart   CBC with Differential/Platelet     Status: None   Collection Time: 09/03/17  7:10 AM  Result Value Ref Range   WBC 8.2 4.0 - 10.5 K/uL   RBC 4.76 3.87 - 5.11 MIL/uL   Hemoglobin 14.8 12.0 - 15.0 g/dL   HCT 43.2 36.0 - 46.0 %   MCV 90.8 78.0 - 100.0 fL   MCH 31.1 26.0 - 34.0 pg   MCHC 34.3 30.0 - 36.0 g/dL   RDW 12.3 11.5 - 15.5 %   Platelets 287 150 - 400 K/uL   Neutrophils Relative % 66 %   Neutro Abs 5.5 1.7 - 7.7 K/uL   Lymphocytes Relative 24 %   Lymphs Abs 1.9 0.7 - 4.0 K/uL   Monocytes Relative 8 %   Monocytes Absolute 0.7 0.1 - 1.0 K/uL   Eosinophils Relative 1 %   Eosinophils Absolute 0.1 0.0 - 0.7 K/uL   Basophils Relative 1 %   Basophils Absolute 0.0 0.0 - 0.1 K/uL   Immature Granulocytes 0 %   Abs Immature Granulocytes 0.0 0.0 - 0.1 K/uL    Comment: Performed at [facility], [address], [place], [place] [address]  Basic metabolic panel     Status: Abnormal   Collection Time: 09/03/17  7:10 AM  Result Value Ref Range   Sodium 138 135 - 145 mmol/L   Potassium 2.9 (L) 3.5 - 5.1 mmol/L   Chloride 104 98 - 111 mmol/L   CO2 23 22 - 32 mmol/L   Glucose, Bld 156 (H) 70 - 99 mg/dL   BUN <5 (L) 8 - 23 mg/dL   Creatinine, Ser 0.37 (L) 0.44 - 1.00 mg/dL   Calcium 9.0 8.9 - 10.3 mg/dL   GFR calc non Af Amer >60 >60 mL/min   GFR calc Af Amer >60 >60 mL/min    Comment: (NOTE) The eGFR has been calculated using the CKD EPI equation. This calculation has not been validated in all clinical situations. eGFR's persistently <60 mL/min signify possible Chronic Kidney Disease.    Anion gap 11 5 - 15    Comment: Performed at [facility] [address], [place], [place] [address]  Magnesium     Status: Abnormal   Collection Time: 09/03/17  7:10 AM  Result Value Ref Range   Magnesium 1.6 (L) 1.7 - 2.4 mg/dL    Comment: Performed at [facility] [address], [place], [place] [address]  Glucose, capillary     Status: Abnormal   Collection Time: 09/03/17  8:39 AM  Result Value Ref Range   Glucose-Capillary 140 (H) 70 - 99 mg/dL  Glucose, capillary     Status: Abnormal   Collection Time: 09/03/17 11:44 AM  Result Value Ref Range   Glucose-Capillary 158 (H) 70 - 99 mg/dL  Glucose, capillary     Status: Abnormal   Collection Time: 09/03/17  4:35 PM  Result Value Ref Range   Glucose-Capillary 136 (H) 70 - 99 mg/dL  Glucose, capillary     Status: Abnormal   Collection Time: 09/03/17  7:34 PM  Result Value Ref Range   Glucose-Capillary 142 (H) 70 - 99 mg/dL   Comment 1 Notify RN    Comment 2 Document in Chart   Glucose, capillary     Status: Abnormal   Collection Time: 09/03/17 11:58 PM  Result Value Ref Range   Glucose-Capillary 128 (H) 70 - 99 mg/dL   Comment 1 Notify RN    Comment 2 Document in Chart   Glucose, capillary     Status: Abnormal    Collection Time: 09/04/17  3:29 AM  Result Value Ref Range   Glucose-Capillary 124 (H) 70 - 99 mg/dL   Comment 1 Notify RN    Comment 2 Document in Chart   CBC with Differential/Platelet     Status: Abnormal   Collection Time: 09/04/17  4:25 AM  Result Value Ref Range   WBC 9.5 4.0 - 10.5 K/uL   RBC 5.03 3.87 - 5.11 MIL/uL   Hemoglobin 15.8 (H) 12.0 - 15.0 g/dL   HCT 46.2 (H) 36.0 - 46.0 %   MCV 91.8 78.0 - 100.0 fL   MCH 31.4 26.0 - 34.0 pg   MCHC 34.2 30.0 - 36.0 g/dL   RDW 12.8 11.5 - 15.5 %   Platelets 280 150 - 400 K/uL   Neutrophils Relative % 48 %   Neutro Abs 4.6 1.7 - 7.7 K/uL   Lymphocytes Relative 41 %   Lymphs Abs 3.9 0.7 - 4.0 K/uL   Monocytes Relative 9 %   Monocytes Absolute 0.9 0.1 - 1.0 K/uL   Eosinophils Relative 1 %   Eosinophils Absolute 0.1 0.0 - 0.7 K/uL   Basophils Relative 1 %   Basophils Absolute 0.1 0.0 - 0.1 K/uL   Immature Granulocytes 0 %   Abs Immature Granulocytes 0.0 0.0 - 0.1 K/uL    Comment: Performed at [facility], [address], [place], [place] [address]  Basic metabolic panel     Status: Abnormal   Collection Time: 09/04/17  4:25 AM  Result Value Ref Range   Sodium 138 135 - 145 mmol/L   Potassium 3.0 (L) 3.5 - 5.1 mmol/L   Chloride 109 98 - 111 mmol/L   CO2 20 (L) 22 - 32 mmol/L   Glucose, Bld 116 (H) 70 - 99 mg/dL   BUN <5 (L) 8 - 23 mg/dL   Creatinine, Ser 0.40 (L) 0.44 - 1.00 mg/dL   Calcium 9.2 8.9 - 10.3 mg/dL   GFR calc non Af Amer >60 >60 mL/min   GFR calc Af Amer >60 >60 mL/min    Comment: (NOTE) The eGFR has been calculated using the CKD EPI equation. This calculation has not been validated in all clinical situations. eGFR's persistently <60 mL/min signify possible Chronic Kidney Disease.    Anion gap 9 5 - 15    Comment: Performed at [facility] [address] [place], [place] [address]  Glucose, capillary     Status: Abnormal   Collection Time: 09/04/17  8:15 AM  Result Value Ref Range    Glucose-Capillary 122 (H) 70 - 99 mg/dL   Comment 1 Notify RN    Comment 2 Document in Chart     Recent Results (from the past 240 hour(s))  MRSA PCR Screening     Status: None   Collection Time: 08/08/2017 11:27 PM  Result Value Ref Range Status   MRSA by PCR NEGATIVE NEGATIVE Final    Comment:        The GeneXpert MRSA Assay (FDA approved for NASAL specimens only), is one component of a comprehensive MRSA colonization surveillance program. It is not intended to diagnose MRSA infection nor to guide or monitor treatment for MRSA infections. Performed at [facility], [place] [address]  [place], [place] [address]   Culture, blood (routine x 2)     Status: None (Preliminary result)   Collection Time: 09/02/17 12:00 PM  Result Value Ref Range Status   Specimen Description BLOOD LEFT HAND  Final   Special Requests   Final    BOTTLES DRAWN AEROBIC ONLY Blood Culture adequate volume   Culture   Final    NO GROWTH 1 DAY Performed at [facility], [place] [address], [place], [place] [address]    Report Status PENDING  Incomplete  Culture, blood (routine x 2)     Status: None (Preliminary result)   Collection Time: 09/02/17 12:05 PM  Result Value Ref Range Status   Specimen Description BLOOD RIGHT HAND  Final   Special Requests   Final    BOTTLES DRAWN AEROBIC ONLY Blood Culture adequate volume   Culture   Final    NO GROWTH 1 DAY Performed at [facility], [place] [address] [place], [place] [address]    Report Status PENDING  Incomplete    Lipid Panel No results for input(s): CHOL, TRIG, HDL, CHOLHDL, VLDL, LDLCALC in the last 72 hours.  Studies/Results: No results found.  Medications:  Scheduled: . acetaminophen  650 mg Rectal Q6H   Or  . acetaminophen  650 mg Oral Q6H  . enoxaparin (LOVENOX) injection  40 mg Subcutaneous Q24H  . ketorolac  30 mg Intravenous Q6H  . PHENObarbital  65 mg Intravenous QHS  . phenytoin (DILANTIN) IV  100 mg Intravenous Q8H  .  sodium chloride flush  3 mL Intravenous Q12H   Continuous: . cefTRIAXone (ROCEPHIN)  IV 1 g (09/03/17 1140)  . lacosamide (VIMPAT) IV 200 mg (09/04/17 1022)  . levETIRAcetam 1,500 mg (09/04/17 0808)   LTM EEG report for this morning 09/03/2017 at 07 30  AM to 7/30 /2019 at 7:30 AM:  Day 2: Background activities marked by 5 to 6 cps background activities with admixed slower frequencies in the delta range without well-defined posterior dominant rhythm.  Superimposed near continuous left parieto-occipital polyspike and wave discharges present throughout the recording at times negative field extending to the right parieto-occipital cortex. In addition frequent electrographic seizures still noted most likely with onset across left parieto-occipital cortex with evolution morphology frequency amplitude and propagation of negative field to the right parieto-occipital region.  There is no obvious clinical accompaniment.  EEG was technically difficult due to  motion artifact. Clinical interpretation: This day 2 of intensive EEG monitoring with simultaneous video monitoring recorded frequent electrographic seizures likely with onset across left parieto-occipital cortex with propagation to the right parieto-occipital region.  In addition ongoing non-remitting left parieto-occipital polyspike and wave discharges present throughout the recording.  These findings suggestive of significant cortical irritability in the left parieto-occipital region.     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 Overall presentation is most consistent with ongoing seizure activity since Wednesday.  1. No resolution of electrographic seizure activity after today after addition yesterday of phenobarbital to her regimen of Vimpat, Keppra and Dilantin.   2. Prior history of seizures.  3. History of left MCA stroke 4. LTM EEG continues to show frequent electrographic seizures likely with onset across the left  parieto-occipital cortex with propagation to the right parieto-occipital region.  5. Tachycardia. May be due to volume depletion.   Recommendations: 1. Continue with LTM EEG.  2. Continue Dilantin with pharmacy to dose.   3. Continue phenobarbital, Vimpat and Keppra 4. Hold  off on MRI brain for now as overall clinical picture most consistent with ongoing seizures rather than extension of stroke. Recent MRI brain on 7/19 showed old left MCA stroke.  5. Continue treatment of urosepsis 6. Yesterday, discussed at length with her daughter and husband the risks/benefits of adding phenobarbital to her regimen. It was felt that the best next option is addition of phenobarbital due to its effect on GABA receptors, which increases synaptic inhibition. However, this would also increase the risk of decreased respiratory drive and/or aspiration. In order to start a trial of this medication, the DNR order would need to be discontinued. Discussed at length with family the risk of possible intubation with addition of phenobarbital, as well as risk that the patient may not be extubatable if she requires mechanical ventilation. The husband and daughter, after informed consent, both agreed with MD recommendation that the DNR order should be discontinued in order to allow a trial of phenobarbital. Phenobarbital 65 mg IV qhs with first dose now has been ordered. DNR order has been discontinued per family request, which was also witnessed by the patient's nurse.  7. Continuous pulse oximetry and cardiac telemetry.  8. Q2H neuro checks.  9. If no improvement with added phenobarbital by tomorrow, will discuss with family the option of transferring the patient to the ICU, intubating and starting burst suppression protocol.  10. Starting NS at 100 cc/hr.    LOS: 3 days   _0  signed: Dr. Kerney Elbe 09/04/2017  10:50 AM

## 2017-09-04 NOTE — Progress Notes (Addendum)
PROGRESS NOTE  Ebony Williams UEA:[id number] DOB: June 02, 1950 DOA: 08/21/2017 PCP: Sharilyn Sites, MD  HPI/Recap of past 55 hours: 67 year old female with medical history significant for recent large MCA distribution stroke in 3/19, with subsequent seizures, hypertension, hyperlipidemia, recent Klebsiella UTI currently on outpatient treatment, presented to the ER from skilled facility due to worsening mental status and lethargy for the past 2 days prior to arrival.  Of note, patient has had multiple admissions since March after her stroke, the last admission was due to Klebsiella UTI of which she was started on Keflex, but never completed due to altered mental status.  In the ED, CT head was negative for any acute intracranial abnormalities.  Patient admitted for further work-up by neurologist and IV treatment of UTI.  Of note, daughter reported that patient was able to have little conversations, and used a wheelchair for ambulation, reports that she had a sudden change of mental status over the past 2 days.  Also has recorded video of patient having what seems to be like ?seizures vs myoclonic jerks.   Today, pt only responding to pain, not following commands, still moaning.   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   Dysphagia, post-stroke   Diastolic dysfunction   Benign essential HTN   Spastic hemiplegia of right dominant side as late effect of cerebral infarction [facility])   Palliative care by specialist   Goals of care, counseling/discussion   DNR (do not resuscitate) discussion   UTI due to Klebsiella species   Seizure, late effect of stroke ([place])  Acute metabolic encephalopathy Likely due to ?recurrent seizures Vs ?extension of CVA, ?UTI/sepsis Afebrile, no leukocytosis Lactic acid within normal limits BC x2 NGTD Chest x-ray unremarkable CT head with no  acute intracranial abnormalities N.p.o., IV fluids SDU  UTI secondary to Klebsiella UC grew >100,000 pansensitive Klebsiella Continue IV Rocephin  Recurrent seizure LTM EEG: Frequent near continuous left parietal spike and polyspike and wave discharges as well as left hemispheric periodic lateralized epileptiform discharges present throughout the recording. All these findings are suggestive of significant cortical irritability in the left posterior cortex. Neurology on board: Continue LTM EEG, vimpat + keppra + dilantin, starting phenobarbital Neurologist also extensively discussed with daughter about adding phenobarbital to her seizure meds, due to ongoing ?status epilepticus seen on LTM EEG. Daughter in agreement and is aware that pt will likely be intubated and may have a difficult extubation. If no improvement, plan for transfer to ICU, intubating and start burst suppression protocol on 09/05/17  Hold off on MRI for now as encephalopathy likely due to recurrent seizures Continue to monitor in SDU, frequent neurochecks Seizure/fall precautions  History of large CVA in 3/19 Occlusion of left middle cerebral artery status post IV TPA Currently wheelchair-bound, in the skilled facility Hold off MRI for now Neurology on board Hold home aspirin, Plavix, statins due to n.p.o. status  Hypokalemia/hypomagnesemia Replace PRN  Dysphasia status post stroke Due to encephalopathy, will keep patient n.p.o. SLP on board  DM type II Controlled Last A1c 07/2017 was 5.1 CBGs Q4H due to NPO status Hold home metformin, may discontinue upon discharge  Hypertension IV hydralazine PRN Hold Home lisinopril, amlodipine  Hyperlipidemia LDL 107 in 04/2017 Hold home statins for now  [place] Palliative care consulted    Code Status: DNR reversed to full code on 09/03/17  Family Communication: None at bedside  Disposition  Plan: Back to SNF   Consultants:  Neurology  Palliative  care  Procedures:  None  Antimicrobials:  IV Rocephin  DVT prophylaxis: Lovenox   Objective: Vitals:   09/04/17 0331 09/04/17 0456 09/04/17 0800 09/04/17 1000  BP: (!) 195/105  (!) 166/89 (!) 149/73  Pulse: 92  (!) 116   Resp: 18  18   Temp: 98.9 F (37.2 C)  98.8 F (37.1 C)   TempSrc: Oral  Axillary   SpO2: 100%  98%   Weight:  69.6 kg (153 lb 7 oz)    Height:        Intake/Output Summary (Last 24 hours) at 09/04/2017 1100 Last data filed at 09/04/2017 0545 Gross per 24 hour  Intake 225 ml  Output 1600 ml  Net -1375 ml   Filed Weights   09/02/17 0500 09/03/17 0500 09/04/17 0456  Weight: 68.1 kg (150 lb 2.1 oz) 70.3 kg (154 lb 15.7 oz) 69.6 kg (153 lb 7 oz)    Exam:   General: Obtunded  Cardiovascular: S1, S2 present  Respiratory: CTA B  Abdomen: Soft, nontender, nondistended, bowel sounds present  Musculoskeletal: No pedal edema bilaterally  Skin: Normal  Psychiatry: Unable to assess  Neurology: Severe residual right hemiparesis, currently unable to follow commands   Data Reviewed: CBC: Recent Labs  Lab 08/17/2017 1247 09/02/17 0633 09/03/17 0710 09/04/17 0425  WBC 8.2 8.1 8.2 9.5  NEUTROABS  --   --  5.5 4.6  HGB 14.8 14.5 14.8 15.8*  HCT 43.9 42.7 43.2 46.2*  MCV 93.6 92.2 90.8 91.8  PLT 269 261 287 850   Basic Metabolic Panel: Recent Labs  Lab 09/03/2017 1419 08/26/2017 2353 09/02/17 0633 09/03/17 0710 09/04/17 0425  NA 141  --  142 138 138  K 3.3*  --  2.9* 2.9* 3.0*  CL 107  --  106 104 109  CO2 22  --  24 23 20*  GLUCOSE 104*  --  149* 156* 116*  BUN 15  --  9 <5* <5*  CREATININE 0.49  --  0.42* 0.37* 0.40*  CALCIUM 9.0  --  9.0 9.0 9.2  MG  --  1.8  --  1.6*  --    GFR: Estimated Creatinine Clearance: 60.7 mL/min (A) (by C-G formula based on SCr of 0.4 mg/dL (L)). Liver Function Tests: Recent Labs  Lab 08/13/2017 1419  AST 25  ALT 19  ALKPHOS 52  BILITOT 1.2  PROT 6.4*  ALBUMIN 3.6   No results for input(s):  LIPASE, AMYLASE in the last 168 hours. No results for input(s): AMMONIA in the last 168 hours. Coagulation Profile: No results for input(s): INR, PROTIME in the last 168 hours. Cardiac Enzymes: No results for input(s): CKTOTAL, CKMB, CKMBINDEX, TROPONINI in the last 168 hours. BNP (last 3 results) No results for input(s): PROBNP in the last 8760 hours. HbA1C: No results for input(s): HGBA1C in the last 72 hours. CBG: Recent Labs  Lab 09/03/17 1635 09/03/17 1934 09/03/17 2358 09/04/17 0329 09/04/17 0815  GLUCAP 136* 142* 128* 124* 122*   Lipid Profile: No results for input(s): CHOL, HDL, LDLCALC, TRIG, CHOLHDL, LDLDIRECT in the last 72 hours. Thyroid Function Tests: No results for input(s): TSH, T4TOTAL, FREET4, T3FREE, THYROIDAB in the last 72 hours. Anemia Panel: No results for input(s): VITAMINB12, FOLATE, FERRITIN, TIBC, IRON, RETICCTPCT in the last 72 hours. Urine analysis:    Component Value Date/Time   COLORURINE YELLOW 08/23/2017 [place] 09/02/2017 1234   LABSPEC  1.015 08/24/2017 1234   PHURINE 5.0 08/11/2017 1234   GLUCOSEU NEGATIVE 08/09/2017 1234   HGBUR NEGATIVE 08/31/2017 [place] 09/03/2017 1234   KETONESUR 80 (A) 08/07/2017 1234   PROTEINUR 30 (A) 08/31/2017 1234   NITRITE NEGATIVE 08/26/2017 1234   LEUKOCYTESUR NEGATIVE 08/16/2017 1234   Sepsis Labs: @LABRCNTIP (procalcitonin:4,lacticidven:4)  ) Recent Results (from the past 240 hour(s))  MRSA PCR Screening     Status: None   Collection Time: 08/13/2017 11:27 PM  Result Value Ref Range Status   MRSA by PCR NEGATIVE NEGATIVE Final    Comment:        The GeneXpert MRSA Assay (FDA approved for NASAL specimens only), is one component of a comprehensive MRSA colonization surveillance program. It is not intended to diagnose MRSA infection nor to guide or monitor treatment for MRSA infections. Performed at [facility], [place] [address], [place], [place]  [address]   Culture, blood (routine x 2)     Status: None (Preliminary result)   Collection Time: 09/02/17 12:00 PM  Result Value Ref Range Status   Specimen Description BLOOD LEFT HAND  Final   Special Requests   Final    BOTTLES DRAWN AEROBIC ONLY Blood Culture adequate volume   Culture   Final    NO GROWTH 1 DAY Performed at [facility], [place] [address] [place], [place] [address]    Report Status PENDING  Incomplete  Culture, blood (routine x 2)     Status: None (Preliminary result)   Collection Time: 09/02/17 12:05 PM  Result Value Ref Range Status   Specimen Description BLOOD RIGHT HAND  Final   Special Requests   Final    BOTTLES DRAWN AEROBIC ONLY Blood Culture adequate volume   Culture   Final    NO GROWTH 1 DAY Performed at [facility], [place] [address], [place], [place] [address]    Report Status PENDING  Incomplete      Studies: No results found.  Scheduled Meds: . acetaminophen  650 mg Rectal Q6H   Or  . acetaminophen  650 mg Oral Q6H  . enoxaparin (LOVENOX) injection  40 mg Subcutaneous Q24H  . ketorolac  30 mg Intravenous Q6H  . PHENObarbital  65 mg Intravenous QHS  . phenytoin (DILANTIN) IV  100 mg Intravenous Q8H  . sodium chloride flush  3 mL Intravenous Q12H    Continuous Infusions: . cefTRIAXone (ROCEPHIN)  IV 1 g (09/03/17 1140)  . dextrose 5 % and 0.45 % NaCl with KCl 20 mEq/L    . lacosamide (VIMPAT) IV 200 mg (09/04/17 1022)  . levETIRAcetam 1,500 mg (09/04/17 7322)  . magnesium sulfate 1 - 4 g bolus IVPB    . potassium chloride       LOS: 3 days     Alma Friendly, MD [facility]   If 7PM-7AM, please contact night-coverage [email or web address] Password [email or web address] 09/04/2017, 11:00 AM

## 2017-09-04 NOTE — Progress Notes (Signed)
Patient with 2 IVFs from 2 different MDs to be infuse at this time. RN paged  MDs to verified. Awaiting for response back from  Dr. Earnestine Leys.   Dr. Earnestine Leys responded. D/c'ed NS IVF at this time. Ordered followed.   Ave Filter, RN

## 2017-09-04 NOTE — Progress Notes (Signed)
Palliative:  I met again at Ebony Williams bedside. At bedside today is daughter Ebony Williams, husband Ebony Williams, and a family friend. I introduced myself and discussed situation with them. Ebony Williams struggled to remain focused on the care of his wife and often spoke of his own health concerns and would nod off asleep at times of our conversation as well. We discussed poor prognosis and the likelihood of intubation tomorrow with hopes of stopping her seizures.   I spoke further with Ebony Williams and Ebony Williams's husband outside room. Ebony Williams is very frustrated and concerned. She acknowledges that her mother may die this admission. Ebony Williams is torn as she wants her mother to be comfortable and knows this is no way her mother would wish to live but also wants to make sure we provide Ebony Williams with all opportunities to improve. Ebony Williams is also trying to decide how to explain the situation to Ebony Williams parents who are both still living. We planned to sit down and talk together with them tomorrow morning ~9 am. Ebony Williams is also trying to decide if she wants her children to visit with their grandmother prior to intubation (if needed) and we discussed how to handle this if she wishes them to visit.   We spent much time discussing the "what ifs" and that if indicated we will intubate to try and stop seizure activity and then we will have to see how Ebony Williams responds. Ebony Williams understands that even stopping the seizures there are many issues that can occur and that her mother may not mentally improve to sustain and she would not want her mother prolonged in that condition. Discussed that we will assist her with focus on comfort if we reach that point and myself or my partners will be here to help her through this.   Exam: Unresponsive, restless at times. On continuous EEG.   58 min  Vinie Sill, NP Palliative Medicine Team Pager # [phone] (M-F 8a-5p) Team Phone # [phone] (Nights/Weekends)

## 2017-09-05 ENCOUNTER — Inpatient Hospital Stay (HOSPITAL_COMMUNITY): Payer: Medicare Other

## 2017-09-05 DIAGNOSIS — G40901 Epilepsy, unspecified, not intractable, with status epilepticus: Secondary | ICD-10-CM

## 2017-09-05 DIAGNOSIS — G9341 Metabolic encephalopathy: Secondary | ICD-10-CM

## 2017-09-05 DIAGNOSIS — I10 Essential (primary) hypertension: Secondary | ICD-10-CM

## 2017-09-05 DIAGNOSIS — Z7189 Other specified counseling: Secondary | ICD-10-CM

## 2017-09-05 DIAGNOSIS — J9690 Respiratory failure, unspecified, unspecified whether with hypoxia or hypercapnia: Secondary | ICD-10-CM

## 2017-09-05 DIAGNOSIS — J9600 Acute respiratory failure, unspecified whether with hypoxia or hypercapnia: Secondary | ICD-10-CM

## 2017-09-05 DIAGNOSIS — Z9911 Dependence on respirator [ventilator] status: Secondary | ICD-10-CM

## 2017-09-05 LAB — POCT I-STAT 3, ART BLOOD GAS (G3+)
Acid-base deficit: 2 mmol/L (ref 0.0–2.0)
BICARBONATE: 24.1 mmol/L (ref 20.0–28.0)
O2 SAT: 99 %
PO2 ART: 156 mmHg — AB (ref 83.0–108.0)
Patient temperature: 98.3
TCO2: 26 mmol/L (ref 22–32)
pCO2 arterial: 46.2 mmHg (ref 32.0–48.0)
pH, Arterial: 7.325 — ABNORMAL LOW (ref 7.350–7.450)

## 2017-09-05 LAB — CBC WITH DIFFERENTIAL/PLATELET
ABS IMMATURE GRANULOCYTES: 0.1 10*3/uL (ref 0.0–0.1)
Basophils Absolute: 0.1 10*3/uL (ref 0.0–0.1)
Basophils Relative: 0 %
Eosinophils Absolute: 0 10*3/uL (ref 0.0–0.7)
Eosinophils Relative: 0 %
HEMATOCRIT: 45.7 % (ref 36.0–46.0)
HEMOGLOBIN: 15.5 g/dL — AB (ref 12.0–15.0)
Immature Granulocytes: 1 %
LYMPHS ABS: 3.4 10*3/uL (ref 0.7–4.0)
Lymphocytes Relative: 26 %
MCH: 31.3 pg (ref 26.0–34.0)
MCHC: 33.9 g/dL (ref 30.0–36.0)
MCV: 92.3 fL (ref 78.0–100.0)
MONO ABS: 0.8 10*3/uL (ref 0.1–1.0)
MONOS PCT: 6 %
NEUTROS ABS: 8.9 10*3/uL — AB (ref 1.7–7.7)
NEUTROS PCT: 67 %
Platelets: 234 10*3/uL (ref 150–400)
RBC: 4.95 MIL/uL (ref 3.87–5.11)
RDW: 13.2 % (ref 11.5–15.5)
WBC: 13.3 10*3/uL — ABNORMAL HIGH (ref 4.0–10.5)

## 2017-09-05 LAB — PROCALCITONIN: PROCALCITONIN: 0.18 ng/mL

## 2017-09-05 LAB — BASIC METABOLIC PANEL
Anion gap: 8 (ref 5–15)
BUN: 10 mg/dL (ref 8–23)
CHLORIDE: 107 mmol/L (ref 98–111)
CO2: 24 mmol/L (ref 22–32)
Calcium: 9.4 mg/dL (ref 8.9–10.3)
Creatinine, Ser: 0.5 mg/dL (ref 0.44–1.00)
GFR calc Af Amer: >60 mL/min (ref >60)
GLUCOSE: 147 mg/dL — AB (ref 70–99)
POTASSIUM: 3.7 mmol/L (ref 3.5–5.1)
Sodium: 139 mmol/L (ref 135–145)

## 2017-09-05 LAB — MAGNESIUM: Magnesium: 2 mg/dL (ref 1.7–2.4)

## 2017-09-05 LAB — GLUCOSE, CAPILLARY
Glucose-Capillary: 127 mg/dL — ABNORMAL HIGH (ref 70–99)
Glucose-Capillary: 128 mg/dL — ABNORMAL HIGH (ref 70–99)
Glucose-Capillary: 140 mg/dL — ABNORMAL HIGH (ref 70–99)
Glucose-Capillary: 149 mg/dL — ABNORMAL HIGH (ref 70–99)
Glucose-Capillary: 96 mg/dL (ref 70–99)

## 2017-09-05 LAB — TRIGLYCERIDES: Triglycerides: 120 mg/dL (ref <150)

## 2017-09-05 LAB — MRSA PCR SCREENING: MRSA by PCR: NEGATIVE

## 2017-09-05 MED ORDER — FENTANYL CITRATE (PF) 100 MCG/2ML IJ SOLN: 50.0000 ug | Freq: Once | INTRAMUSCULAR | Status: DC

## 2017-09-05 MED ORDER — MIDAZOLAM BOLUS VIA INFUSION
7.0000 mg | Freq: Once | INTRAVENOUS | Status: AC
  Administered 2017-09-05: 7 mg via INTRAVENOUS
  Filled 2017-09-05: qty 7

## 2017-09-05 MED ORDER — SODIUM CHLORIDE 0.9 % IV SOLN
15.0000 mg/h | INTRAVENOUS | Status: DC
  Administered 2017-09-05 (×2): 15 mg/h via INTRAVENOUS
  Administered 2017-09-05: 7 mg/h via INTRAVENOUS
  Filled 2017-09-05 (×3): qty 10

## 2017-09-05 MED ORDER — MIDAZOLAM HCL 50 MG/10ML IJ SOLN
55.0000 mg/h | INTRAMUSCULAR | Status: DC
  Administered 2017-09-05: 15 mg/h via INTRAVENOUS
  Administered 2017-09-06: 25 mg/h via INTRAVENOUS
  Administered 2017-09-06: 15 mg/h via INTRAVENOUS
  Administered 2017-09-06: 25 mg/h via INTRAVENOUS
  Administered 2017-09-06: 35 mg/h via INTRAVENOUS
  Administered 2017-09-06: 25 mg/h via INTRAVENOUS
  Administered 2017-09-06 – 2017-09-07 (×2): 35 mg/h via INTRAVENOUS
  Administered 2017-09-07: 45 mg/h via INTRAVENOUS
  Administered 2017-09-08 (×3): 55 mg/h via INTRAVENOUS
  Administered 2017-09-08: 45 mg/h via INTRAVENOUS
  Administered 2017-09-09 (×3): 55 mg/h via INTRAVENOUS
  Filled 2017-09-05: qty 50
  Filled 2017-09-05: qty 20
  Filled 2017-09-05 (×3): qty 50
  Filled 2017-09-05: qty 20
  Filled 2017-09-05: qty 50
  Filled 2017-09-05: qty 20
  Filled 2017-09-05 (×2): qty 50
  Filled 2017-09-05: qty 10
  Filled 2017-09-05: qty 20
  Filled 2017-09-05: qty 50
  Filled 2017-09-05: qty 20
  Filled 2017-09-05 (×3): qty 50

## 2017-09-05 MED ORDER — FAMOTIDINE IN NACL 20-0.9 MG/50ML-% IV SOLN
20.0000 mg | INTRAVENOUS | Status: DC
  Administered 2017-09-05 – 2017-09-10 (×6): 20 mg via INTRAVENOUS
  Filled 2017-09-05 (×6): qty 50

## 2017-09-05 MED ORDER — FENTANYL BOLUS VIA INFUSION
25.0000 ug | INTRAVENOUS | Status: DC | PRN
  Filled 2017-09-05: qty 25

## 2017-09-05 MED ORDER — SODIUM CHLORIDE 0.9 % IV SOLN
INTRAVENOUS | Status: DC
  Administered 2017-09-05 – 2017-09-10 (×3): via INTRAVENOUS

## 2017-09-05 MED ORDER — MIDAZOLAM HCL 2 MG/2ML IJ SOLN
INTRAMUSCULAR | Status: AC
  Filled 2017-09-05: qty 2

## 2017-09-05 MED ORDER — ROCURONIUM BROMIDE 50 MG/5ML IV SOLN
50.0000 mg | Freq: Once | INTRAVENOUS | Status: AC
  Administered 2017-09-05: 50 mg via INTRAVENOUS
  Filled 2017-09-05: qty 5

## 2017-09-05 MED ORDER — PROPOFOL 1000 MG/100ML IV EMUL
0.0000 ug/kg/min | INTRAVENOUS | Status: DC
  Administered 2017-09-05: 25 ug/kg/min via INTRAVENOUS
  Administered 2017-09-05: 10 ug/kg/min via INTRAVENOUS
  Administered 2017-09-06 – 2017-09-07 (×5): 30 ug/kg/min via INTRAVENOUS
  Filled 2017-09-05 (×8): qty 100

## 2017-09-05 MED ORDER — FENTANYL 2500MCG IN NS 250ML (10MCG/ML) PREMIX INFUSION
25.0000 ug/h | INTRAVENOUS | Status: DC
  Administered 2017-09-07: 25 ug/h via INTRAVENOUS
  Administered 2017-09-10: 75 ug/h via INTRAVENOUS
  Filled 2017-09-05 (×2): qty 250

## 2017-09-05 MED ORDER — MIDAZOLAM HCL 2 MG/2ML IJ SOLN
2.0000 mg | Freq: Once | INTRAMUSCULAR | Status: AC
  Administered 2017-09-05: 2 mg via INTRAVENOUS

## 2017-09-05 MED ORDER — ORAL CARE MOUTH RINSE
15.0000 mL | OROMUCOSAL | Status: DC
  Administered 2017-09-05 – 2017-09-11 (×62): 15 mL via OROMUCOSAL

## 2017-09-05 MED ORDER — FENTANYL CITRATE (PF) 100 MCG/2ML IJ SOLN
INTRAMUSCULAR | Status: AC
  Filled 2017-09-05: qty 2

## 2017-09-05 MED ORDER — CHLORHEXIDINE GLUCONATE 0.12% ORAL RINSE (MEDLINE KIT)
15.0000 mL | Freq: Two times a day (BID) | OROMUCOSAL | Status: DC
  Administered 2017-09-05 – 2017-09-11 (×12): 15 mL via OROMUCOSAL

## 2017-09-05 MED ORDER — ETOMIDATE 2 MG/ML IV SOLN
20.0000 mg | Freq: Once | INTRAVENOUS | Status: AC
  Administered 2017-09-05: 20 mg via INTRAVENOUS

## 2017-09-05 NOTE — Procedures (Signed)
Electroencephalography report.  Long-term monitoring  Data acquisition: International 10-20 for eligible placement.  18 channels EEG with additional EKG channel  Recording begins 09/04/2017 at 07 30  AM Recording and 7/31 /2019 at 7:30 AM  Number of study day 3  CPT 95951  This intensive EEG monitoring with simultaneous video monitoring to provide this patient with history of stroke and now cognitive and functional decline to rule out clinical and subclinical electrographic seizures  Day 1 : There was one pushbutton activation events however appeared to be an accident.  Background activities marked by disorganized background activity slowing ranging between 5 to 6 cps with me with admixed faster frequencies as well as delta slowing throughout the recording without posterior dominant rhythm.  Superimposed near continues left posterior polyspike and wave discharges present at times was brought negative field and periodic fashion suggestive of left lateralizing periodic  epileptiform discharges.  At times this left posterior polyspike and wave discharges become rhythmic synchronized with evolving features in morphology frequency amplitude and extension of negative field in a right parieto-occipital cortex consistent with electrographic seizures.  During second half of the recording background activities obscured by significant electrode artifact however when there is a time with a relative absence of electrode artifact it appears that patient continue to have electrographic seizures as discussed above.  There was no obvious clinical correlate to this electrographic seizures.  Day 2: Background activities marked by 5 to 6 cps background activities with admixed slower frequencies in the delta range without well-defined posterior dominant rhythm.  Superimposed near continuous left parieto-occipital polyspike and wave discharges present throughout the recording at times negative field extending to the right  parieto-occipital cortex.  In addition frequent electrographic seizures still noted most likely with onset across left parieto-occipital cortex with evolution morphology frequency amplitude and propagation of negative field to the right parieto-occipital region.  There is no obvious clinical accompaniment.  EEG was technically difficult due to  motion artifact  Day 3: Background activities marked by background activity slowing distributed broadly.  Superimposed continues non-remitting 1 cps periodic polyspike and wave discharges with maximum negativity in the left parieto-occipital cortex present throughout the recording.  During second half of the recording this left hemispheric discharges become more blunted and not as well developed suggesting of some degree of improvement however these discharges occur still 1 cps frequency.  There is also independent right posterior sharp waves maximum negativity right parieto-occipital cortex present throughout the recording however significantly less developed.  Occasionally electrographic seizures present involving a left parieto-occipital and propagating to the right parietal cortex without obvious clinical accompaniment.  Clinical interpretation: This day 3 of intensive EEG monitoring with simultaneous video monitoring is abnormal for several reasons #1 still frequent near continuous left parieto-occipital polyspike and wave discharges as discussed above suggestive of significant cortical irritability in that region however there is slight improvement in morphology during second half of the recording.  #2 less frequent right parieto-occipital sharp waves also suggestive of all beta lesser extent of cortical irritability in the right posterior cortex.  #3 electrographic seizures still occasionally present throughout the recording as discussed above.   Clinical correlation is advised.

## 2017-09-05 NOTE — Progress Notes (Signed)
Patient transferred to 4N31 at this time. Family at bedside.   Ave Filter, RN

## 2017-09-05 NOTE — Progress Notes (Signed)
SLP Cancellation Note  Patient Details Name: CORBYN STEEDMAN MRN: [id number] DOB: 1950-04-20   Cancelled treatment:       Reason Eval/Treat Not Completed: Medical issues which prohibited therapy.  Pt now intubated with poor overall prognosis.  SLP orders are now cancelled; however, please re-consult should pt have an improvement in medical status warranting ST follow up.     Maurico Perrell, Selinda Orion 09/05/2017, 1:32 PM

## 2017-09-05 NOTE — Consult Note (Signed)
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  Consequently this is resulted in seizures.  Additional medical concerns: Recent UTI, hypertension, immobility.  On her best day post stroke she was wheelchair-bound, still having right hemiparesis with spasticity.  She was able to transfer via standing pivot with assistance.  She was verbal,, fully alert, oriented to person place and time but had some moderate expressive aphasia.  She did have appropriate attention and her memory was intact.  This is as recent as June 2016.  More recently she was just discharged from the hospital to skilled nursing facility following urinary tract infection.  She was This is a 67 year old white female she presents to the emergency room released there for rehabilitation.  On 7/27 with altered mental status.  Also inability to take her oral medications.  She was admitted with a concern for subclinical seizure, CT and MRI imaging were obtained, as were neurological consult.  An EEG was obtained demonstrating left hemisphere seizure, she was admitted for titration of her anti-epileptic medications.  On 729 continuous EEG was initiated she continued to have semi-process of whole movement to the left upper and left lower extremity but remained in a coma.  EEG evaluation showed subclinical seizure activity, AEDs were once again titrated.  As of 7/31 she had since had the addition of phenobarbital, on top of Vimpat and Keppra as well as Dilantin.  Based on ongoing status it was decided she should be transferred to the intensive care for burst suppression  protocol.  Critical care was asked to evaluate and assist with airway management.  PAST MEDICAL HISTORY :  She  has a past medical history of History of kidney stones and Hypertension.  PAST SURGICAL HISTORY: She  has a past surgical history that includes Cholecystectomy; Tubal ligation; and Colonoscopy with propofol (N/A, 01/12/2015).  Allergies  Allergen Reactions  . Sulfa Antibiotics Diarrhea and Itching    No current facility-administered medications on file prior to encounter.    Current Outpatient Medications on File Prior to Encounter  Medication Sig  . acetaminophen (TYLENOL) 325 MG tablet Take 2 tablets (650 mg total) by mouth every 6 (six) hours as needed for mild pain, fever or headache. (Patient taking differently: Take 650 mg by mouth every 6 (six) hours as needed for fever (general discomfort). )  . ALPRAZolam (XANAX) 0.5 MG tablet Take 1 tablet (0.5 mg total) by mouth 3 (three) times daily as needed for anxiety or sleep. (Patient taking differently: Take 0.5 mg by mouth every 8 (eight) hours as needed for anxiety or sleep. )  . amantadine (SYMMETREL) 100 MG capsule Take 2 capsules (200 mg total) by mouth 2 (two) times daily.  [name] amLODipine (NORVASC) 10 MG tablet Take 1 tablet (10 mg total) by mouth daily.  [name] aspirin 81 MG chewable tablet Chew 1 tablet (81 mg total) by mouth daily.  [name] atorvastatin (LIPITOR) 40 MG tablet Take 1 tablet (40 mg total) by mouth daily.  . cephALEXin (KEFLEX) 500 MG capsule Take 500 mg by mouth every 12 (twelve) hours. 7 day course (for UTI) started  08/28/17 pm  . clopidogrel (PLAVIX) 75 MG tablet Take 1 tablet (75 mg total) by mouth daily.  [name] FLUoxetine (PROZAC) 40 MG capsule Take 1 capsule (40 mg total) by mouth at bedtime.  . levETIRAcetam (KEPPRA) 100 MG/ML solution Take 1,500 mg by mouth 2 (two) times daily.  [name] lisinopril (PRINIVIL,ZESTRIL) 5 MG tablet Take 5 mg by mouth daily.  . metFORMIN (GLUCOPHAGE) 500 MG tablet Take 1 tablet (500 mg total)  by mouth daily with breakfast.  . nystatin (NYSTATIN) powder Apply topically See admin instructions. Apply topically to groin every 12 hours as needed for rash/irritation  . ondansetron (ZOFRAN) 4 MG tablet Take 4 mg by mouth every 8 (eight) hours as needed for nausea or vomiting.  . pantoprazole (PROTONIX) 40 MG tablet Take 1 tablet (40 mg total) by mouth daily.  . polyethylene glycol (MIRALAX / GLYCOLAX) packet Take 17 g by mouth daily. Mix with 8-10 oz water and drink  . potassium chloride SA (K-DUR,KLOR-CON) 20 MEQ tablet Take 20 mEq by mouth daily.  [name] PRESCRIPTION MEDICATION See admin instructions. Administer normal saline 1000 ml @@ 25 ml/hr via clysis every shift (day, evening, night)  . traMADol (ULTRAM) 50 MG tablet Take 1 tablet (50 mg total) by mouth every 8 (eight) hours as needed for severe pain. (Patient taking differently: Take 50 mg by mouth See admin instructions. Take one tablet (50 mg) by mouth every 12 hours, may also take one tablet every 8 hours as needed for severe pain)  . feeding supplement, GLUCERNA SHAKE, (GLUCERNA SHAKE) LIQD Take 237 mLs by mouth 3 (three) times daily between meals. (Patient not taking: Reported on 08/20/2017)  . levETIRAcetam (KEPPRA) 750 MG tablet Take 2 tablets (1,500 mg total) by mouth 2 (two) times daily. (Patient not taking: Reported on 08/14/2017)    FAMILY HISTORY:  Her family history includes Diabetes in her mother; Hypertension in her mother.  SOCIAL HISTORY: She  reports that she has never smoked. She has never used smokeless tobacco. She reports that she drinks alcohol. She reports that she does not use drugs.  REVIEW OF SYSTEMS:   Unable   SUBJECTIVE:  Unresponsive   VITAL SIGNS: Blood Pressure (Abnormal) 185/88   Pulse 95   Temperature 98.3 F (36.8 C)   Respiration 17   Height 5\' 2"  (1.575 m)   Weight 153 lb 7 oz (69.6 kg)   Oxygen Saturation 100%   Body Mass Index 28.06 kg/m    HEMODYNAMICS:    VENTILATOR  SETTINGS: Vent Mode: PRVC FiO2 (%):  [40 %] 40 % Set Rate:  [16 bmp] 16 bmp Vt Set:  [400 mL-480 mL] 400 mL PEEP:  [5 cmH20] 5 cmH20 Plateau Pressure:  [14 cmH20] 14 cmH20  INTAKE / OUTPUT:  Intake/Output Summary (Last 24 hours) at 09/05/2017 1249 Last data filed at 09/05/2017 1200 Gross per 24 hour  Intake 2319.48 ml  Output no documentation  Net 2319.48 ml     PHYSICAL EXAMINATION: General:  Chronically ill appearing white female. Currently unresponsive  Neuro:  Unresponsive. Non-verbal. Moves left side spontaneously   HEENT:  NCAT right sided facial droop.  Cardiovascular:  RRR no MRG Lungs:  Snoring respiratory efforts Abdomen:  Soft not tender + bowel sounds  Musculoskeletal:  Right sided weakness Skin:  Warm and dry   LABS:  BMET Recent Labs  Lab 09/03/17 0710 09/04/17 0425 09/05/17 0455  NA 138 138 139  K 2.9* 3.0* 3.7  CL 104 109 107  CO2 23  20* 24  BUN <5* <5* 10  CREATININE 0.37* 0.40* 0.50  GLUCOSE 156* 116* 147*    Electrolytes Recent Labs  Lab 09/03/2017 2353  09/03/17 0710 09/04/17 0425 09/05/17 0455  CALCIUM  --    < > 9.0 9.2 9.4  MG 1.8  --  1.6*  --  2.0   < > = values in this interval not displayed.    CBC Recent Labs  Lab 09/03/17 0710 09/04/17 0425 09/05/17 0455  WBC 8.2 9.5 13.3*  HGB 14.8 15.8* 15.5*  HCT 43.2 46.2* 45.7  PLT 287 280 234    Coag's No results for input(s): APTT, INR in the last 168 hours.  Sepsis Markers Recent Labs  Lab 08/06/2017 1254 08/13/2017 1436  LATICACIDVEN 1.23 1.07    ABG No results for input(s): PHART, PCO2ART, PO2ART in the last 168 hours.  Liver Enzymes Recent Labs  Lab 08/29/2017 1419  AST 25  ALT 19  ALKPHOS 52  BILITOT 1.2  ALBUMIN 3.6    Cardiac Enzymes No results for input(s): TROPONINI, PROBNP in the last 168 hours.  Glucose Recent Labs  Lab 09/04/17 1130 09/04/17 1547 09/04/17 2007 09/04/17 2343 09/05/17 0402 09/05/17 0719  GLUCAP 125* 194* 133* 128* 128* 149*     Imaging Dg Chest Port 1 View  Result Date: 09/05/2017 CLINICAL DATA:  Ventilator support. EXAM: PORTABLE CHEST 1 VIEW COMPARISON:  08/17/2017 FINDINGS: Endotracheal tube tip is 1 cm above the carina. Orogastric or nasogastric tube enters the stomach. The lungs are clear except for mild atelectasis in the left lower lobe. No edema or visible effusion. Atherosclerosis and tortuosity of the aorta. IMPRESSION: Endotracheal tube and orogastric tube appear well positioned. Mild atelectasis in the left lower lobe. Electronically Signed   By: Nelson Chimes M.D.   On: 09/05/2017 11:46   Dg Abd Portable 1v  Result Date: 09/05/2017 CLINICAL DATA:  Orogastric placement. EXAM: PORTABLE ABDOMEN - 1 VIEW COMPARISON:  None. FINDINGS: Orogastric tube enters the stomach with its tip in the fundus. Gas pattern is unremarkable. No abnormal calcifications or bone findings. IMPRESSION: Orogastric tube tip in the gastric fundus. Gas pattern unremarkable. Electronically Signed   By: Nelson Chimes M.D.   On: 09/05/2017 11:47     STUDIES:  CT head 7/27: No evidence of acute intracranial abnormality.  Atrophy with chronic small vessel white matter ischemic changes and remote left-sided infarct. 7/31 EEG: Day 3 of intensive EEG monitoring.  Still near continuous left parietal occipital polyspike epileptiform changes  CULTURES: Sputum culture 7/31 Urine culture 7/31 Blood culture 7/28   ANTIBIOTICS:   LINES/TUBES: Oral endotracheal tube 7/31  DISCUSSION: 67 year old female with prior MCA stroke, now presents with status epilepticus.  Critical care asked to support airway for burst suppression therapy.  Long discussion with family including daughter and son.  Plan will be for about 48 hours of aggressive care in hopes to stop seizure activity.  Family does not want prolonged aggressive therapy particularly given the overall poor prognosis.  ASSESSMENT / PLAN:  Status epilepticus.  Now status post 72 hours  without cessation of seizure activity as evidenced by EEG. Plan Continue antiepileptic drugs per neurology: Currently Dilantin, phenobarbital, Vimpat and Keppra Burst suppression protocol Continuous EEG Supportive care  History of left MCA stroke with right-sided hemiparesis Plan Supportive care Resume aspirin and Plavix when able to use GI route  Acute respiratory failure in the setting of ineffective airway clearance Snoring respirations prior to intubation.  Needing airway support for  burst suppression protocol Plan Full ventilator support Follow-up ABG and chest x-ray Ventilator associated pneumonia interventions  Vomiting Plan Orogastric tube to low intermittent wall suction Bowel rest  Possible aspiration event: Patient vomited on arrival to the intensive care No debris noted on vocal cords during intubation- Plan Serial chest x-ray Sputum culture Procalcitonin algorithm May need antibiotics  Hypertension Plan Initiating propofol in burst suppression protocol. Will place PRN hydralazine for systolic blood pressure greater than 180  Mild leukocytosis -No fever.  Significance unclear Plan Trend fever and white blood cell curve Reculturing urine given recent UTI Sputum culture as above   Inadequate caloric intake Plan Start tube feeds 8/1   Hyperglycemia Plan Sliding scale insulin  DVT prophylaxis: scd; advance to [place] heparin when ok w/ neuro SUP: H2B  Diet: NPO Activity: BR Disposition : ICU Code status: DNR (limited code) would not want prolonged care on vent   Erick Colace ACNP-BC [place] Pager # [phone] OR # [phone] if no answer   09/05/2017, 12:38 PM

## 2017-09-05 NOTE — Progress Notes (Signed)
[facility]  Patient Status: Active (receiving services up to time of hospitalization)  [facility] is providing the following services: RN, PT, OT and ST  If patient discharges after hours, please call [phone].   Ebony Williams 09/05/2017, 9:48 AM

## 2017-09-05 NOTE — Procedures (Signed)
Intubation Procedure Note SRUTI AYLLON [id number] Dec 25, 1950  Procedure: Intubation Indications: Airway protection and maintenance  Procedure Details Consent: Risks of procedure as well as the alternatives and risks of each were explained to the (patient/caregiver).  Consent for procedure obtained. Verbal consent obtained from husband and daughter at bedside.  Time Out: Verified patient identification, verified procedure, site/side was marked, verified correct patient position, special equipment/implants available, medications/allergies/relevent history reviewed, required imaging and test results available.  Performed  Maximum sterile technique was used including gloves, hand hygiene and mask.  MAC videolaryngoscope   Evaluation Hemodynamic Status: BP stable throughout; O2 sats: stable throughout Patient's Current Condition: stable Complications: No apparent complications Patient did tolerate procedure well. Chest X-ray ordered to verify placement.  CXR: pending.  [facility] 09/05/2017

## 2017-09-05 NOTE — Progress Notes (Signed)
PCCM:  67 yo FM, status epilepticus in non-convulsive status, called by Neurology recommending ICU transfer, intubation and attempt at burst suppression.  Risks, benefits, alternatives discussed with family. Consent obtained for endotracheal intubation. Please see separate procedure note.   Full consult note to follow.  I have informed our neurology colleagues.   [place] 09/05/2017

## 2017-09-05 NOTE — Progress Notes (Signed)
LTM EEG checked. No skin breakdown noted under Fp1, Fp2 and F7. EKG leads reapplied.

## 2017-09-05 NOTE — Progress Notes (Signed)
Subjective: No improvement with phenobarbital x 2 days.   Objective: Current vital signs: BP 139/63 (BP Location: Right Arm)   Pulse 66   Temp 98.3 F (36.8 C) (Axillary)   Resp 17   Ht '5\' 2"'$  (1.575 m)   Wt 69.6 kg (153 lb 7 oz)   SpO2 99%   BMI 28.06 kg/m  Vital signs in last 24 hours: Temp:  [98.3 F (36.8 C)-99.1 F (37.3 C)] 98.3 F (36.8 C) (07/31 0435) Pulse Rate:  [66-116] 66 (07/31 0435) Resp:  [15-22] 17 (07/31 0435) BP: (116-178)/(63-96) 139/63 (07/31 0713) SpO2:  [95 %-99 %] 99 % (07/31 0435)  Intake/Output from previous day: 07/30 0701 - 07/31 0700 In: 2088.6 [I.V.:1023.6; IV Piggyback:1065] Out: -  Intake/Output this shift: No intake/output data recorded. Nutritional status:  Diet Order           Diet NPO time specified Except for: Sips with Meds  Diet effective now         HEENT: [place]/AT Lungs: Respirations unlabored  Neurologic Exam: Ment: Somnolent to obtunded. Clinically appears to be in a state of deep sleep and is unarousable but will grimace occasionally.  CN: PERRL. Right facial droop.  Motor/Sensory: Increased tone RUE and RLE. Withdraws LUE and LLE semipurposefully to noxious stimuli.   Lab Results: Results for orders placed or performed during the hospital encounter of 08/15/2017 (from the past 48 hour(s))  Glucose, capillary     Status: Abnormal   Collection Time: 09/03/17  8:39 AM  Result Value Ref Range   Glucose-Capillary 140 (H) 70 - 99 mg/dL  Glucose, capillary     Status: Abnormal   Collection Time: 09/03/17 11:44 AM  Result Value Ref Range   Glucose-Capillary 158 (H) 70 - 99 mg/dL  Glucose, capillary     Status: Abnormal   Collection Time: 09/03/17  4:35 PM  Result Value Ref Range   Glucose-Capillary 136 (H) 70 - 99 mg/dL  Glucose, capillary     Status: Abnormal   Collection Time: 09/03/17  7:34 PM  Result Value Ref Range   Glucose-Capillary 142 (H) 70 - 99 mg/dL   Comment 1 Notify RN    Comment 2 Document in Chart    Glucose, capillary     Status: Abnormal   Collection Time: 09/03/17 11:58 PM  Result Value Ref Range   Glucose-Capillary 128 (H) 70 - 99 mg/dL   Comment 1 Notify RN    Comment 2 Document in Chart   Glucose, capillary     Status: Abnormal   Collection Time: 09/04/17  3:29 AM  Result Value Ref Range   Glucose-Capillary 124 (H) 70 - 99 mg/dL   Comment 1 Notify RN    Comment 2 Document in Chart   CBC with Differential/Platelet     Status: Abnormal   Collection Time: 09/04/17  4:25 AM  Result Value Ref Range   WBC 9.5 4.0 - 10.5 K/uL   RBC 5.03 3.87 - 5.11 MIL/uL   Hemoglobin 15.8 (H) 12.0 - 15.0 g/dL   HCT 46.2 (H) 36.0 - 46.0 %   MCV 91.8 78.0 - 100.0 fL   MCH 31.4 26.0 - 34.0 pg   MCHC 34.2 30.0 - 36.0 g/dL   RDW 12.8 11.5 - 15.5 %   Platelets 280 150 - 400 K/uL   Neutrophils Relative % 48 %   Neutro Abs 4.6 1.7 - 7.7 K/uL   Lymphocytes Relative 41 %   Lymphs Abs 3.9 0.7 - 4.0 K/uL  Monocytes Relative 9 %   Monocytes Absolute 0.9 0.1 - 1.0 K/uL   Eosinophils Relative 1 %   Eosinophils Absolute 0.1 0.0 - 0.7 K/uL   Basophils Relative 1 %   Basophils Absolute 0.1 0.0 - 0.1 K/uL   Immature Granulocytes 0 %   Abs Immature Granulocytes 0.0 0.0 - 0.1 K/uL    Comment: Performed at [facility] [address] [place], [place] [address]  Basic metabolic panel     Status: Abnormal   Collection Time: 09/04/17  4:25 AM  Result Value Ref Range   Sodium 138 135 - 145 mmol/L   Potassium 3.0 (L) 3.5 - 5.1 mmol/L   Chloride 109 98 - 111 mmol/L   CO2 20 (L) 22 - 32 mmol/L   Glucose, Bld 116 (H) 70 - 99 mg/dL   BUN <5 (L) 8 - 23 mg/dL   Creatinine, Ser 0.40 (L) 0.44 - 1.00 mg/dL   Calcium 9.2 8.9 - 10.3 mg/dL   GFR calc non Af Amer >60 >60 mL/min   GFR calc Af Amer >60 >60 mL/min    Comment: (NOTE) The eGFR has been calculated using the CKD EPI equation. This calculation has not been validated in all clinical situations. eGFR's persistently <60 mL/min signify possible  Chronic Kidney Disease.    Anion gap 9 5 - 15    Comment: Performed at [facility] [address] [place], [place] [address]  Glucose, capillary     Status: Abnormal   Collection Time: 09/04/17  8:15 AM  Result Value Ref Range   Glucose-Capillary 122 (H) 70 - 99 mg/dL   Comment 1 Notify RN    Comment 2 Document in Chart   Glucose, capillary     Status: Abnormal   Collection Time: 09/04/17 11:30 AM  Result Value Ref Range   Glucose-Capillary 125 (H) 70 - 99 mg/dL   Comment 1 Notify RN    Comment 2 Document in Chart   Glucose, capillary     Status: Abnormal   Collection Time: 09/04/17  3:47 PM  Result Value Ref Range   Glucose-Capillary 194 (H) 70 - 99 mg/dL   Comment 1 Notify RN    Comment 2 Document in Chart   Glucose, capillary     Status: Abnormal   Collection Time: 09/04/17  8:07 PM  Result Value Ref Range   Glucose-Capillary 133 (H) 70 - 99 mg/dL   Comment 1 Notify RN    Comment 2 Document in Chart   Glucose, capillary     Status: Abnormal   Collection Time: 09/04/17 11:43 PM  Result Value Ref Range   Glucose-Capillary 128 (H) 70 - 99 mg/dL   Comment 1 Notify RN    Comment 2 Document in Chart   Glucose, capillary     Status: Abnormal   Collection Time: 09/05/17  4:02 AM  Result Value Ref Range   Glucose-Capillary 128 (H) 70 - 99 mg/dL   Comment 1 Notify RN    Comment 2 Document in Chart   CBC with Differential/Platelet     Status: Abnormal (Preliminary result)   Collection Time: 09/05/17  4:55 AM  Result Value Ref Range   WBC 13.3 (H) 4.0 - 10.5 K/uL   RBC 4.95 3.87 - 5.11 MIL/uL   Hemoglobin 15.5 (H) 12.0 - 15.0 g/dL   HCT 45.7 36.0 - 46.0 %   MCV 92.3 78.0 - 100.0 fL   MCH 31.3 26.0 - 34.0 pg  MCHC 33.9 30.0 - 36.0 g/dL   RDW 13.2 11.5 - 15.5 %   Platelets PENDING 150 - 400 K/uL   Neutrophils Relative % 67 %   Neutro Abs 8.9 (H) 1.7 - 7.7 K/uL   Lymphocytes Relative 26 %   Lymphs Abs 3.4 0.7 - 4.0 K/uL   Monocytes Relative 6 %   Monocytes  Absolute 0.8 0.1 - 1.0 K/uL   Eosinophils Relative 0 %   Eosinophils Absolute 0.0 0.0 - 0.7 K/uL   Basophils Relative 0 %   Basophils Absolute 0.1 0.0 - 0.1 K/uL   Immature Granulocytes 1 %   Abs Immature Granulocytes 0.1 0.0 - 0.1 K/uL    Comment: Performed at [facility] [address], [place], [place] [address]  Basic metabolic panel     Status: Abnormal   Collection Time: 09/05/17  4:55 AM  Result Value Ref Range   Sodium 139 135 - 145 mmol/L   Potassium 3.7 3.5 - 5.1 mmol/L    Comment: DELTA CHECK NOTED   Chloride 107 98 - 111 mmol/L   CO2 24 22 - 32 mmol/L   Glucose, Bld 147 (H) 70 - 99 mg/dL   BUN 10 8 - 23 mg/dL   Creatinine, Ser 0.50 0.44 - 1.00 mg/dL   Calcium 9.4 8.9 - 10.3 mg/dL   GFR calc non Af Amer >60 >60 mL/min   GFR calc Af Amer >60 >60 mL/min    Comment: (NOTE) The eGFR has been calculated using the CKD EPI equation. This calculation has not been validated in all clinical situations. eGFR's persistently <60 mL/min signify possible Chronic Kidney Disease.    Anion gap 8 5 - 15    Comment: Performed at [facility] [address], [place], [place] [address]  Magnesium     Status: None   Collection Time: 09/05/17  4:55 AM  Result Value Ref Range   Magnesium 2.0 1.7 - 2.4 mg/dL    Comment: Performed at [facility] [address], [place], [place] [address]  Glucose, capillary     Status: Abnormal   Collection Time: 09/05/17  7:19 AM  Result Value Ref Range   Glucose-Capillary 149 (H) 70 - 99 mg/dL    Recent Results (from the past 240 hour(s))  MRSA PCR Screening     Status: None   Collection Time: 08/17/2017 11:27 PM  Result Value Ref Range Status   MRSA by PCR NEGATIVE NEGATIVE Final    Comment:        The GeneXpert MRSA Assay (FDA approved for NASAL specimens only), is one component of a comprehensive MRSA colonization surveillance program. It is not intended to diagnose MRSA infection nor to guide or monitor treatment  for MRSA infections. Performed at [facility], [place] [address] [place], [place] [address]   Culture, blood (routine x 2)     Status: None (Preliminary result)   Collection Time: 09/02/17 12:00 PM  Result Value Ref Range Status   Specimen Description BLOOD LEFT HAND  Final   Special Requests   Final    BOTTLES DRAWN AEROBIC ONLY Blood Culture adequate volume   Culture   Final    NO GROWTH 2 DAYS Performed at [facility], [place] [address] [place],  [address]    Report Status PENDING  Incomplete  Culture, blood (routine x 2)     Status: None (Preliminary result)   Collection Time: 09/02/17 12:05 PM  Result Value Ref Range Status   Specimen Description BLOOD RIGHT HAND  Final   Special Requests   Final    BOTTLES DRAWN AEROBIC ONLY Blood Culture adequate volume   Culture   Final    NO GROWTH 2 DAYS Performed at [facility], [address], [place], [place] [address]    Report Status PENDING  Incomplete    Lipid Panel No results for input(s): CHOL, TRIG, HDL, CHOLHDL, VLDL, LDLCALC in the last 72 hours.  Studies/Results: No results found.  Medications:  Scheduled: . acetaminophen  650 mg Rectal Q6H   Or  . acetaminophen  650 mg Oral Q6H  . enoxaparin (LOVENOX) injection  40 mg Subcutaneous Q24H  . ketorolac  15 mg Intravenous Q6H  . PHENObarbital  65 mg Intravenous QHS  . phenytoin (DILANTIN) IV  100 mg Intravenous Q8H  . sodium chloride flush  3 mL Intravenous Q12H   Continuous: . cefTRIAXone (ROCEPHIN)  IV Stopped (09/04/17 1146)  . dextrose 5 % and 0.45 % NaCl with KCl 20 mEq/L 75 mL/hr at 09/05/17 0400  . lacosamide (VIMPAT) IV Stopped (09/04/17 2258)  . levETIRAcetam 1,500 mg (09/05/17 0712)   LTM EEG report from yesterday: Clinical interpretation: This 2 of intensive EEG monitoring with simultaneous video monitoring recorded frequent electrographic seizures likely with onset across left parieto-occipital cortex with propagation to the  right parieto-occipital region.  In addition ongoing non-remitting left parieto-occipital polyspike and wave discharges present throughout the recording.  These findings suggestive of significant cortical irritability in the left parieto-occipital region.  Clinical correlation is advised.  LTM EEG report for this morning:  Pending   Preliminary review of LTM EEG: Continued electrographic seizure activity.   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 Overall presentation is most consistent with ongoing seizure activity since Wednesday,7/24 which has been refractory to multiple anticonvulsants 1.No resolution of electrographic seizure activity since addition on Monday of phenobarbital to her regimen of Vimpat, Keppra and Dilantin.  2. Prior history of seizures 3. History of leftMCA stroke 4.LTM EEG yesterday continued to show frequent electrographic seizures likely with onset across the left parieto-occipital cortex with propagation to the right parieto-occipital region.    Recommendations: 1.Continue with LTM EEG.  2. Continue Dilantin with pharmacy to dose.   3. Continuephenobarbital, Vimpat and Keppra 4. Transfer to ICU for intubation and burst suppression protocol. I have discussed the case with the ICU attending. Husband provided consent over the telephone this morning and daughter is in agreement.  5. Continuous pulse oximetry and cardiac telemetry.  6. Q2H neuro checks.  35 minutes spent in the neurological evaluation and management of this critically ill patient with status epilepticus.    LOS: 4 days   '@Electronically'$  signed: Dr. Kerney Elbe 09/05/2017  7:49 AM

## 2017-09-05 NOTE — Progress Notes (Addendum)
Nutrition Follow-up  DOCUMENTATION CODES:   Non-severe (moderate) malnutrition in context of chronic illness  INTERVENTION:   If pt remains intubated, recommend intiation of enteral nutrition in 24-48 hours: - Vital High Protein @ 45 ml/hr (1080 ml/day) - liquid MVI as tube feeding does not meet 100% RDIs  Recommended tube feeding regimen provides 1080 kcal, 95 grams of protein, and 907 ml of H2O.   Recommended tube feeding regimen and current propofol provides 1355 total kcal (100% of needs)  NUTRITION DIAGNOSIS:   Moderate Malnutrition related to chronic illness (recent stroke) as evidenced by mild fat depletion, mild muscle depletion, percent weight loss.  Ongoing  GOAL:   Patient will meet greater than or equal to 90% of their needs  Unmet  MONITOR:   Diet advancement  REASON FOR ASSESSMENT:   Ventilator    ASSESSMENT:   Patient with PMH recent stroke in March, seizures, HTN, HLD, recent UTI, presents with metabolic encephaloapthy vs status epilepticus and UTI.  7/31 - pt transferred to ICU for intubation and burst suppression protocol  Discussed pt with RN and during ICU rounds. Per critical care NP, plan to start tube feeds tomorrow, 09/06/17. RD to leave TF recommendations.  Pt's husband at bedside at time of RD visit.  Patient is currently intubated on ventilator support. Pt with OG tube in stomach. MVe: 6.6 L/min Temp (24hrs), Avg:98.6 F (37 C), Min:98.3 F (36.8 C), Max:99.1 F (37.3 C) BP: 137/82 MAP: 98  Propofol: 10.4 ml/hr (provides 275 kcal/day)  Medications reviewed: D5 and KCL 20 mEq/L @ 75 ml/hr (provides 306 kcal/day)  Labs reviewed. CBG's: 149, 128, 128, 133, 194 x 24 hours  Diet Order:   Diet Order           Diet NPO time specified  Diet effective now          EDUCATION NEEDS:   Not appropriate for education at this time  Skin:  Skin Assessment: Reviewed RN Assessment  Last BM:  09/05/17 medium type 6  Height:   Ht  Readings from Last 1 Encounters:  08/19/2017 5\' 2"  (1.575 m)    Weight:   Wt Readings from Last 1 Encounters:  09/04/17 153 lb 7 oz (69.6 kg)    Ideal Body Weight:  50 kg  BMI:  Body mass index is 28.06 kg/m.  Estimated Nutritional Needs:   Kcal:  1354 kcal/day  Protein:  95-110 grams/day  Fluid:  >1.6L    Gaynell Face, MS, RD, LDN Pager: [phone] Weekend/After Hours: [phone]

## 2017-09-05 NOTE — Progress Notes (Signed)
LTM EEG reviewed. In burst suppression at approximately 1:1 ratio. Some spikes seen during suppressed segments. Increasing Versed gtt to 15 mg/hr to further optimize tracings.   Electronically signed: Dr. Kerney Elbe

## 2017-09-05 NOTE — Progress Notes (Signed)
Palliative:  I met at Ebony Williams bedside after brief discussion with Dr. Cheral Marker. Spoke with daughter, Ebony Williams, regarding plan for intubation today. She had more questions regarding expectations in different scenarios with the ventilator which I discussed with her. Ebony Williams is trying to hold out hope but also is trying to come to terms with overall poor prognosis.   I returned to meet with Ebony Williams's husband, daughter, parents and close family friends today at [facility] request. I explained to them the current plan with intubation and efforts to stop seizures. I also discussed my concern that prognosis likely still poor even with successful treatment of seizures given her poor functional status prior to this hospital stay, many complications since stroke in May, and her ongoing seizure activity with further insult to her brain. We discussed that she may or may not successfully come of the ventilator breathing on her own and even if so I would worry about her mental state and QOL. All questions/concerns addressed.   I also clarified with husband, Ebony Williams, and daughter, Ebony Williams, that we are to continue intubation for now but otherwise will reinstate DNR - she would not want resuscitative efforts if she were to continue to decline. They are both clear that she would not want to exist in a state that she cannot interact with her family and have any sort of QOL and they would not want that for her either.   Emotional support provided. I will continue to follow.    Time In/Out: 2409-7353, 1100-1200 Total Time: 90 min  Vinie Sill, NP Palliative Medicine Team Pager # [phone] (M-F 8a-5p) Team Phone # [phone] (Nights/Weekends)

## 2017-09-05 NOTE — Progress Notes (Signed)
PROGRESS NOTE    Ebony Williams  TGG:[id number] DOB: [birth year] DOA: 08/20/2017 PCP: Sharilyn Sites, MD    Brief Narrative: 67 year old female with medical history significant for recent large MCA distribution stroke in 3/19, with subsequent seizures, hypertension, hyperlipidemia, recent Klebsiella UTI currently on outpatient treatment, presented to the ER from skilled facility due to worsening mental status and lethargy for the past 2 days prior to arrival.  Of note, patient has had multiple admissions since March after her stroke, the last admission was due to Klebsiella UTI of which she was started on Keflex, but never completed due to altered mental status.  In the ED, CT head was negative for any acute intracranial abnormalities.  Patient admitted for further work-up by neurologist and IV treatment of UTI.  Of note, daughter reported that patient was able to have little conversations, and used a wheelchair for ambulation, reports that she had a sudden change of mental status over the past 2 days.  Also has recorded video of patient having what seems to be like ?seizures vs myoclonic jerks.      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  Still with seizure like activity on continues EEG.  Discussed with neurology, plan to transfer to ICU for intubation and Burst  suppression.  CCM consulted.   3-UTI, klebsiella;   IV ceftriaxone   History of large CVA in 3/19 Occlusion of left middle cerebral artery status post IV TPA Currently wheelchair-bound, in the skilled facility Hold off MRI for now Neurology on board Resume aspirin and plavix when able    Dysphagia post stroke.  NPO.   HTN; PRN hydralazine.    Hyperlipidemia LDL 107 in 04/2017 Hold home statins for now  [place] Palliative care consulted  Patient's care transfer to CCM.    DVT prophylaxis: Lovenox Code Status; full code now for intubation and treatment of seizure.  Family Communication: daughter at bedside.  Disposition Plan: to be determine.    Consultants:   Neurology  CCM   Procedures:  Intubation 7-31   Antimicrobials:  Ceftriaxone 7-28   Subjective: Lethargic, no responsive,   Objective: Vitals:   09/05/17 0405 09/05/17 0435 09/05/17 0713 09/05/17 0800  BP: 139/63  139/63   Pulse: 98 66    Resp: 19 17    Temp:  98.3 F (36.8 C)  98.3 F (36.8 C)  TempSrc:  Axillary    SpO2: 95% 99%    Weight:      Height:        Intake/Output Summary (Last 24 hours) at 09/05/2017 0929 Last data filed at 09/05/2017 0400 Gross per 24 hour  Intake 1988.55 ml  Output -  Net 1988.55 ml   Filed Weights   09/02/17 0500 09/03/17 0500 09/04/17 0456  Weight: 68.1 kg (150 lb 2.1 oz) 70.3 kg (154 lb 15.7 oz) 69.6 kg (153 lb 7 oz)  Examination:  General exam: Appears calm and comfortable  Respiratory system: Clear to auscultation. Respiratory effort normal. Cardiovascular system: S1 & S2 heard, RRR. No JVD, murmurs, rubs, gallops or clicks. No pedal edema. Gastrointestinal system: Abdomen is nondistended, soft and nontender. No organomegaly or masses felt. Normal bowel sounds heard. Central nervous system: lethargic.  Extremities: Symmetric 5 x 5 power. Skin: No rashes, lesions or ulcers    Data Reviewed: I have personally reviewed following labs and  imaging studies  CBC: Recent Labs  Lab 08/22/2017 1247 09/02/17 0633 09/03/17 0710 09/04/17 0425 09/05/17 0455  WBC 8.2 8.1 8.2 9.5 13.3*  NEUTROABS  --   --  5.5 4.6 8.9*  HGB 14.8 14.5 14.8 15.8* 15.5*  HCT 43.9 42.7 43.2 46.2* 45.7  MCV 93.6 92.2 90.8 91.8 92.3  PLT 269 261 287 280 PENDING   Basic Metabolic Panel: Recent Labs  Lab 09/05/2017 1419 08/21/2017 2353 09/02/17 0633 09/03/17 0710 09/04/17 0425 09/05/17 0455  NA 141  --  142 138 138 139  K 3.3*  --  2.9* 2.9* 3.0* 3.7  CL 107  --  106 104 109 107  CO2 22  --  24 23 20* 24  GLUCOSE 104*  --  149* 156* 116* 147*  BUN 15  --  9 <5* <5* 10  CREATININE 0.49  --  0.42* 0.37* 0.40* 0.50  CALCIUM 9.0  --  9.0 9.0 9.2 9.4  MG  --  1.8  --  1.6*  --  2.0   GFR: Estimated Creatinine Clearance: 60.7 mL/min (by C-G formula based on SCr of 0.5 mg/dL). Liver Function Tests: Recent Labs  Lab 08/28/2017 1419  AST 25  ALT 19  ALKPHOS 52  BILITOT 1.2  PROT 6.4*  ALBUMIN 3.6   No results for input(s): LIPASE, AMYLASE in the last 168 hours. No results for input(s): AMMONIA in the last 168 hours. Coagulation Profile: No results for input(s): INR, PROTIME in the last 168 hours. Cardiac Enzymes: No results for input(s): CKTOTAL, CKMB, CKMBINDEX, TROPONINI in the last 168 hours. BNP (last 3 results) No results for input(s): PROBNP in the last 8760 hours. HbA1C: No results for input(s): HGBA1C in the last 72 hours. CBG: Recent Labs  Lab 09/04/17 1547 09/04/17 2007 09/04/17 2343 09/05/17 0402 09/05/17 0719  GLUCAP 194* 133* 128* 128* 149*   Lipid Profile: No results for input(s): CHOL, HDL, LDLCALC, TRIG, CHOLHDL, LDLDIRECT in the last 72 hours. Thyroid Function Tests: No results for input(s): TSH, T4TOTAL, FREET4, T3FREE, THYROIDAB in the last 72 hours. Anemia Panel: No results for input(s): VITAMINB12, FOLATE, FERRITIN, TIBC, IRON, RETICCTPCT in the last 72 hours. Sepsis Labs: Recent Labs  Lab 08/22/2017 1254  08/20/2017 1436  LATICACIDVEN 1.23 1.07    Recent Results (from the past 240 hour(s))  MRSA PCR Screening     Status: None   Collection Time: 08/24/2017 11:27 PM  Result Value Ref Range Status   MRSA by PCR NEGATIVE NEGATIVE Final    Comment:        The GeneXpert MRSA Assay (FDA approved for NASAL specimens only), is one component of a comprehensive MRSA colonization surveillance program. It is not intended to diagnose MRSA infection nor to guide or monitor treatment for MRSA infections. Performed at [facility], [place] [address], [place], [place] [address]   Culture, blood (routine x 2)     Status: None (Preliminary result)   Collection Time: 09/02/17 12:00 PM  Result Value Ref Range Status  Specimen Description BLOOD LEFT HAND  Final   Special Requests   Final    BOTTLES DRAWN AEROBIC ONLY Blood Culture adequate volume   Culture   Final    NO GROWTH 2 DAYS Performed at [facility], [address] [place], [place] [address]    Report Status PENDING  Incomplete  Culture, blood (routine x 2)     Status: None (Preliminary result)   Collection Time: 09/02/17 12:05 PM  Result Value Ref Range Status   Specimen Description BLOOD RIGHT HAND  Final   Special Requests   Final    BOTTLES DRAWN AEROBIC ONLY Blood Culture adequate volume   Culture   Final    NO GROWTH 2 DAYS Performed at [facility], [place] [address], [place], [place] [address]    Report Status PENDING  Incomplete         Radiology Studies: No results found.      Scheduled Meds: . acetaminophen  650 mg Rectal Q6H   Or  . acetaminophen  650 mg Oral Q6H  . enoxaparin (LOVENOX) injection  40 mg Subcutaneous Q24H  . ketorolac  15 mg Intravenous Q6H  . PHENObarbital  65 mg Intravenous QHS  . phenytoin (DILANTIN) IV  100 mg Intravenous Q8H  . sodium chloride flush  3 mL Intravenous Q12H   Continuous Infusions: . cefTRIAXone (ROCEPHIN)  IV Stopped (09/04/17 1146)  . dextrose 5 % and  0.45 % NaCl with KCl 20 mEq/L 75 mL/hr at 09/05/17 0400  . lacosamide (VIMPAT) IV 200 mg (09/05/17 0928)  . levETIRAcetam 1,500 mg (09/05/17 0712)     LOS: 4 days    Time spent: 35 minutes.     Elmarie Shiley, MD [facility] Pager [phone]  If 7PM-7AM, please contact night-coverage [email or web address] Password [facility] 09/05/2017, 9:29 AM

## 2017-09-06 DIAGNOSIS — Z4659 Encounter for fitting and adjustment of other gastrointestinal appliance and device: Secondary | ICD-10-CM

## 2017-09-06 DIAGNOSIS — R4182 Altered mental status, unspecified: Secondary | ICD-10-CM

## 2017-09-06 DIAGNOSIS — I69391 Dysphagia following cerebral infarction: Secondary | ICD-10-CM

## 2017-09-06 LAB — GLUCOSE, CAPILLARY
GLUCOSE-CAPILLARY: 89 mg/dL (ref 70–99)
GLUCOSE-CAPILLARY: 122 mg/dL — AB (ref 70–99)
Glucose-Capillary: 104 mg/dL — ABNORMAL HIGH (ref 70–99)

## 2017-09-06 LAB — CBC WITH DIFFERENTIAL/PLATELET
ABS IMMATURE GRANULOCYTES: 0 10*3/uL (ref 0.0–0.1)
Basophils Absolute: 0.1 10*3/uL (ref 0.0–0.1)
Basophils Relative: 1 %
EOS PCT: 3 %
Eosinophils Absolute: 0.3 10*3/uL (ref 0.0–0.7)
HEMATOCRIT: 43.3 % (ref 36.0–46.0)
Hemoglobin: 14.1 g/dL (ref 12.0–15.0)
IMMATURE GRANULOCYTES: 0 %
LYMPHS ABS: 4.5 10*3/uL — AB (ref 0.7–4.0)
LYMPHS PCT: 41 %
MCH: 31.3 pg (ref 26.0–34.0)
MCHC: 32.6 g/dL (ref 30.0–36.0)
MCV: 96 fL (ref 78.0–100.0)
Monocytes Absolute: 1 10*3/uL (ref 0.1–1.0)
Monocytes Relative: 9 %
NEUTROS ABS: 5 10*3/uL (ref 1.7–7.7)
NEUTROS PCT: 46 %
Platelets: 184 10*3/uL (ref 150–400)
RBC: 4.51 MIL/uL (ref 3.87–5.11)
RDW: 13.2 % (ref 11.5–15.5)
WBC: 10.9 10*3/uL — AB (ref 4.0–10.5)

## 2017-09-06 LAB — BASIC METABOLIC PANEL
ANION GAP: 7 (ref 5–15)
BUN: 11 mg/dL (ref 8–23)
CHLORIDE: 109 mmol/L (ref 98–111)
CO2: 24 mmol/L (ref 22–32)
CREATININE: 0.56 mg/dL (ref 0.44–1.00)
Calcium: 9.1 mg/dL (ref 8.9–10.3)
GFR calc non Af Amer: >60 mL/min (ref >60)
Glucose, Bld: 107 mg/dL — ABNORMAL HIGH (ref 70–99)
Potassium: 3.7 mmol/L (ref 3.5–5.1)
SODIUM: 140 mmol/L (ref 135–145)

## 2017-09-06 LAB — URINE CULTURE: CULTURE: NO GROWTH

## 2017-09-06 LAB — CK: CK TOTAL: 23 U/L — AB (ref 38–234)

## 2017-09-06 MED ORDER — VITAL HIGH PROTEIN PO LIQD
1000.0000 mL | ORAL | Status: DC
  Administered 2017-09-06 – 2017-09-07 (×4): 1000 mL
  Administered 2017-09-08: 12:00:00
  Administered 2017-09-09 – 2017-09-11 (×3): 1000 mL
  Filled 2017-09-06: qty 1000

## 2017-09-06 MED ORDER — PRO-STAT SUGAR FREE PO LIQD
30.0000 mL | Freq: Two times a day (BID) | ORAL | Status: DC
  Administered 2017-09-06 – 2017-09-11 (×11): 30 mL
  Filled 2017-09-06 (×11): qty 30

## 2017-09-06 MED ORDER — ATORVASTATIN CALCIUM 40 MG PO TABS
40.0000 mg | ORAL_TABLET | Freq: Every day | ORAL | Status: DC
  Administered 2017-09-06 – 2017-09-11 (×6): 40 mg via ORAL
  Filled 2017-09-06 (×6): qty 1

## 2017-09-06 MED ORDER — CLOPIDOGREL BISULFATE 75 MG PO TABS
75.0000 mg | ORAL_TABLET | Freq: Every day | ORAL | Status: DC
  Administered 2017-09-06 – 2017-09-11 (×6): 75 mg via ORAL
  Filled 2017-09-06 (×6): qty 1

## 2017-09-06 MED ORDER — ASPIRIN 81 MG PO CHEW
81.0000 mg | CHEWABLE_TABLET | Freq: Every day | ORAL | Status: DC
  Administered 2017-09-06 – 2017-09-11 (×6): 81 mg via ORAL
  Filled 2017-09-06 (×6): qty 1

## 2017-09-06 NOTE — Procedures (Signed)
Electroencephalography report.  Long-term monitoring  Data acquisition: International 10-20 for eligible placement.  18 channels EEG with additional EKG channel  Recording begins 09/05/2017 at 07 30  AM Recording and 08/01 /2019 at 7:30 AM  Number of study day 4  CPT 95951  This intensive EEG monitoring with simultaneous video monitoring to provide this patient with history of stroke and now cognitive and functional decline to rule out clinical and subclinical electrographic seizures  Day 1 : There was one pushbutton activation events however appeared to be an accident.  Background activities marked by disorganized background activity slowing ranging between 5 to 6 cps with me with admixed faster frequencies as well as delta slowing throughout the recording without posterior dominant rhythm.  Superimposed near continues left posterior polyspike and wave discharges present at times was brought negative field and periodic fashion suggestive of left lateralizing periodic  epileptiform discharges.  At times this left posterior polyspike and wave discharges become rhythmic synchronized with evolving features in morphology frequency amplitude and extension of negative field in a right parieto-occipital cortex consistent with electrographic seizures.  During second half of the recording background activities obscured by significant electrode artifact however when there is a time with a relative absence of electrode artifact it appears that patient continue to have electrographic seizures as discussed above.  There was no obvious clinical correlate to this electrographic seizures.  Day 2: Background activities marked by 5 to 6 cps background activities with admixed slower frequencies in the delta range without well-defined posterior dominant rhythm.  Superimposed near continuous left parieto-occipital polyspike and wave discharges present throughout the recording at times negative field extending to the  right parieto-occipital cortex.  In addition frequent electrographic seizures still noted most likely with onset across left parieto-occipital cortex with evolution morphology frequency amplitude and propagation of negative field to the right parieto-occipital region.  There is no obvious clinical accompaniment.  EEG was technically difficult due to  motion artifact  Day 3: Background activities marked by background activity slowing distributed broadly.  Superimposed continues non-remitting 1 cps periodic polyspike and wave discharges with maximum negativity in the left parieto-occipital cortex present throughout the recording.  During second half of the recording this left hemispheric discharges become more blunted and not as well developed suggesting of some degree of improvement however these discharges occur still 1 cps frequency. There is also independent right posterior sharp waves maximum negativity right parieto-occipital cortex present throughout the recording however significantly less developed.  Occasionally electrographic seizures present involving a left parieto-occipital and propagating to the right parietal cortex without obvious clinical accompaniment.  Day 4: Burst suppression pattern was achieved around 12 noon.  Since that time background activities remain marked by burst and suppression pattern with 3-3 ratio.  No seizures noted.  Occasionally within those burst left frontotemporal and independent right frontal and temporal sharp waves and spikes present throughout the recording.  Clinical interpretation: This day 4 of intensive EEG monitoring with simultaneous video monitoring is is marked by achievement of burst suppression pattern as discussed above.  No seizures since that time.  Occasional independent left temporal and right frontotemporal sharp waves and spikes still present suggestive of cortical irritability.  Clinical correlation is advised.

## 2017-09-06 NOTE — Progress Notes (Signed)
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  Consequently this is resulted in seizures.  Additional medical concerns: Recent UTI, hypertension, immobility.  On her best day post stroke she was wheelchair-bound, still having right hemiparesis with spasticity.  She was verbal,, fully alert, oriented to person place and time but had some moderate expressive aphasia.  More recently she was just discharged from the hospital to skilled nursing facility following urinary tract infection. On 7/27 with altered mental status.  Also inability to take her oral medications.  She was admitted with a concern for subclinical seizure, CT and MRI imaging were obtained, as were neurological consult.  An EEG was obtained demonstrating left hemisphere seizure, she was admitted for titration of her anti-epileptic medications.  On 729 continuous EEG was initiated she continued to have semi-process of whole movement to the left upper and left lower extremity but remained in a coma.  EEG evaluation showed subclinical seizure activity, AEDs were once again titrated.  As of 7/31 she had since had the addition of phenobarbital, on top of Vimpat and Keppra as well as Dilantin.  Based on ongoing status it was decided she should be transferred to the intensive care for burst suppression protocol.  Critical care was asked to evaluate and assist with airway management.  Subjective: No significant events noted overnight, seems like neurology plan to increase the perceptive to 25 -Hemoclear remained stable - Currently on Versed 20, normal saline at 75 cc/h,  not on any pressors - Currently on vent setting with PRBC rate of 18 FiO2 30% PEEP of 5-initial ABG showed pH of 7.32 with PCO2 46 and rate is increased to 18  STUDIES:  CT head 7/27: No evidence of acute intracranial abnormality.  Atrophy with chronic small vessel white matter ischemic changes and remote left-sided infarct. 7/31 EEG: Day 3 of intensive EEG monitoring.  Still near continuous left parietal occipital polyspike epileptiform changes  CULTURES: Sputum culture 7/31 Urine culture 7/31 Blood culture 7/28  ANTIBIOTICS: Rocephin  LINES/TUBES: Oral endotracheal tube 7/31  VITAL SIGNS: BP 135/70   Pulse 82   Temp 97.7 F (36.5 C) (Axillary)   Resp 18   Ht 5\' 2"  (1.575 m)   Wt 145 lb 1 oz (65.8 kg)   SpO2 100%   BMI 26.53 kg/m   VENTILATOR SETTINGS: Vent Mode: PRVC FiO2 (%):  [30 %-40 %] 30 % Set Rate:  [16 bmp-18 bmp] 18 bmp Vt Set:  [400 mL-480 mL] 400 mL PEEP:  [5 cmH20] 5 cmH20 Plateau Pressure:  [14 cmH20-16 cmH20] 15 cmH20  INTAKE / OUTPUT:  Intake/Output Summary (Last 24 hours) at 09/06/2017 2703 Last data filed at 09/06/2017 0700 Gross per 24 hour  Intake 2365.62 ml  Output 475 ml  Net 1890.62 ml     PHYSICAL EXAMINATION: General:  Chronically ill appearing white female. Currently unresponsive on vent and sedated Neuro:  Unresponsive. Non-verbal. Moves left side spontaneously   HEENT:  NCAT right sided facial droop.  Cardiovascular:  RRR no MRG Lungs: Bilateral prolonged expiration Abdomen:  Soft not tender + bowel sounds  Musculoskeletal:  Right sided weakness Skin:  Warm and dry   LABS:  BMET Recent Labs  Lab 09/04/17 0425 09/05/17 0455 09/06/17 0252  NA 138 139 140  K 3.0* 3.7 3.7  CL 109 107 109  CO2 20* 24 24  BUN <5* 10 11  CREATININE 0.40* 0.50 0.56  GLUCOSE 116* 147* 107*    Electrolytes Recent Labs  Lab 08/10/2017 2353  09/03/17 0710 09/04/17 0425 09/05/17 0455 09/06/17 0252  CALCIUM  --    < > 9.0 9.2 9.4 9.1  MG 1.8   --  1.6*  --  2.0  --    < > = values in this interval not displayed.    CBC Recent Labs  Lab 09/04/17 0425 09/05/17 0455 09/06/17 0252  WBC 9.5 13.3* 10.9*  HGB 15.8* 15.5* 14.1  HCT 46.2* 45.7 43.3  PLT 280 234 184    Coag's No results for input(s): APTT, INR in the last 168 hours.  Sepsis Markers Recent Labs  Lab 09/03/2017 1254 08/29/2017 1436 09/05/17 1221  LATICACIDVEN 1.23 1.07  --   PROCALCITON  --   --  0.18    ABG Recent Labs  Lab 09/05/17 1248  PHART 7.325*  PCO2ART 46.2  PO2ART 156.0*    Liver Enzymes Recent Labs  Lab 09/04/2017 1419  AST 25  ALT 19  ALKPHOS 52  BILITOT 1.2  ALBUMIN 3.6    Cardiac Enzymes No results for input(s): TROPONINI, PROBNP in the last 168 hours.  Glucose Recent Labs  Lab 09/05/17 0719 09/05/17 1634 09/05/17 1945 09/05/17 2333 09/06/17 0335 09/06/17 0818  GLUCAP 149* 140* 127* 96 89 122*    Imaging Dg Chest Port 1 View  Result Date: 09/05/2017 CLINICAL DATA:  Ventilator support. EXAM: PORTABLE CHEST 1 VIEW COMPARISON:  08/30/2017 FINDINGS: Endotracheal tube tip is 1 cm above the carina. Orogastric or nasogastric tube enters the stomach. The lungs are clear except for mild atelectasis in the left lower lobe. No edema or visible effusion. Atherosclerosis and tortuosity of the aorta. IMPRESSION: Endotracheal tube and orogastric tube appear well positioned. Mild atelectasis in the left lower lobe. Electronically Signed   By: Nelson Chimes M.D.   On: 09/05/2017 11:46   Dg Abd Portable 1v  Result Date: 09/05/2017 CLINICAL DATA:  Orogastric placement. EXAM: PORTABLE ABDOMEN - 1 VIEW COMPARISON:  None. FINDINGS: Orogastric tube enters the stomach with its tip in the fundus. Gas pattern is unremarkable. No abnormal calcifications or bone findings. IMPRESSION: Orogastric tube tip in the gastric fundus. Gas pattern unremarkable. Electronically Signed   By: Nelson Chimes M.D.   On: 09/05/2017 11:47   DISCUSSION: 67 year old  female with prior MCA stroke, now presents with status epilepticus.  Critical care asked to support airway for burst suppression therapy.  Long discussion with family including daughter and son.  Plan will be for about 48 hours of aggressive care in hopes to stop seizure activity.  Family does not want prolonged aggressive therapy particularly given the overall poor prognosis.  ASSESSMENT / PLAN:  Status epilepticus.  Now status post 72 hours without cessation of seizure activity as evidenced by EEG. Plan Continue antiepileptic drugs per neurology: Currently Dilantin, phenobarbital, Vimpat and Keppra Burst suppression protocol- Versed drip was increased to 25 today morning Continuous EEG Supportive care  History of left MCA stroke with right-sided hemiparesis Plan Supportive care Resume aspirin and Plavix and atorvastatin  Acute respiratory failure in the setting of ineffective airway clearance Intubated for airway protection with burst suppression  on 09/05/2017 Plan Full ventilator support Follow-up ABG and chest x-ray Aspiration precaution -Add bronchodilators -No plan for SBT ~suppression is stopped and patient seizures are improved  Vomiting Plan Orogastric tube to low intermittent wall suction No further vomiting noted -Abdominal x-ray was okay - Dietary consult and start feeding  Possible aspiration event: Patient vomited on arrival to the intensive care No debris noted on vocal cords during intubation- Plan Serial chest x-ray Sputum culture Currently on Rocephin for UTI, monitor if show signs of worsening fever or sepsis switch Rocephin to Zosyn -Repeat x-ray tomorrow morning   Hypertension Plan On Versed in burst suppression protocol. Will place PRN hydralazine for systolic blood pressure greater than 180  Mild leukocytosis -No fever.  Significance unclear-improvement noted Plan Trend fever and white blood cell curve-cultures negative so far Reculturing urine  given recent UTI Sputum culture as above   Inadequate caloric intake Plan Start tube feeds 8/1   Hyperglycemia Plan Sliding scale insulin   Skin/Wound: chronic changes  Electrolytes: Replace electrolytes per ICU electrolyte replacement protocol.   IVF: NS at 74  Nutrition: If ok by neurology start feeding  Prophylaxis: DVT Prophylaxis with lovenox,. GI Prophylaxis.   Restraints: soft limb  PT/OT eval and treat. OOB when appropriate.   Lines/Tubes:  7/31 foley  no central line.  ADVANCE DIRECTIVE:Partial code no CPR ok with intubation  FAMILY DISCUSSION:spoke with daughter  Quality Care: PPI, DVT prophylaxis, HOB elevated, Infection control all reviewed and addressed.  Events and notes from last 24 hours reviewed. Care plan discussed on multidisciplinary rounds  CC TIME:35 min    Old records reviewed discussed results and management plan with patient  Images personally reviewed and results and labs reviewed and discussed with patient.  All medication reviewed and adjusted  Further management depending on test results and work up as outlined above.    Lahoma Rocker, M.D    09/06/2017, 9:03 AM

## 2017-09-06 NOTE — Progress Notes (Signed)
Spoke with on call neurology Dr. Rory Percy regarding noticing pt EEG with notification of "study completed" and locked screen. He stated he would attempt to come look and troubleshoot, however there have been multiple stroke pts in the ED.

## 2017-09-06 NOTE — Progress Notes (Signed)
[place] for phenytoin Indication: seizures  Allergies  Allergen Reactions  . Sulfa Antibiotics Diarrhea and Itching    Patient Measurements: Height: 5\' 2"  (157.5 cm) Weight: 145 lb 1 oz (65.8 kg) IBW/kg (Calculated) : 50.1 Adjusted Body Weight: 58 kg  Vital Signs: Temp: 97.7 F (36.5 C) (08/01 0827) Temp Source: Axillary (08/01 0827) BP: 135/70 (08/01 0745) Pulse Rate: 82 (08/01 0745) Intake/Output from previous day: 07/31 0701 - 08/01 0700 In: 2465.6 [I.V.:2000.6; IV Piggyback:465] Out: 475 [Urine:475] Intake/Output from this shift: No intake/output data recorded.  Labs: Recent Labs    09/04/17 0425 09/05/17 0455 09/06/17 0252  WBC 9.5 13.3* 10.9*  HGB 15.8* 15.5* 14.1  HCT 46.2* 45.7 43.3  PLT 280 234 184  CREATININE 0.40* 0.50 0.56  MG  --  2.0  --    Estimated Creatinine Clearance: 59.1 mL/min (by C-G formula based on SCr of 0.56 mg/dL).   Medical History: Past Medical History:  Diagnosis Date  . History of kidney stones    x1  . Hypertension     Medications:  Medications Prior to Admission  Medication Sig Dispense Refill Last Dose  . acetaminophen (TYLENOL) 325 MG tablet Take 2 tablets (650 mg total) by mouth every 6 (six) hours as needed for mild pain, fever or headache. (Patient taking differently: Take 650 mg by mouth every 6 (six) hours as needed for fever (general discomfort). )   unknown  . ALPRAZolam (XANAX) 0.5 MG tablet Take 1 tablet (0.5 mg total) by mouth 3 (three) times daily as needed for anxiety or sleep. (Patient taking differently: Take 0.5 mg by mouth every 8 (eight) hours as needed for anxiety or sleep. ) 30 tablet 0 08/26/2017 at 1324  . amantadine (SYMMETREL) 100 MG capsule Take 2 capsules (200 mg total) by mouth 2 (two) times daily. 10 capsule 0 08/30/2017 at 1700  . amLODipine (NORVASC) 10 MG tablet Take 1 tablet (10 mg total) by mouth daily.   08/30/2017 at 900  . aspirin 81 MG chewable  tablet Chew 1 tablet (81 mg total) by mouth daily.   08/30/2017 at 900  . atorvastatin (LIPITOR) 40 MG tablet Take 1 tablet (40 mg total) by mouth daily.   08/30/2017 at 900  . cephALEXin (KEFLEX) 500 MG capsule Take 500 mg by mouth every 12 (twelve) hours. 7 day course (for UTI) started 08/28/17 pm   08/30/2017 at 2100  . clopidogrel (PLAVIX) 75 MG tablet Take 1 tablet (75 mg total) by mouth daily.   08/30/2017 at 900  . FLUoxetine (PROZAC) 40 MG capsule Take 1 capsule (40 mg total) by mouth at bedtime.  3 08/30/2017 at 2100  . levETIRAcetam (KEPPRA) 100 MG/ML solution Take 1,500 mg by mouth 2 (two) times daily.   08/30/2017 at 1700  . lisinopril (PRINIVIL,ZESTRIL) 5 MG tablet Take 5 mg by mouth daily.   08/30/2017 at 900  . metFORMIN (GLUCOPHAGE) 500 MG tablet Take 1 tablet (500 mg total) by mouth daily with breakfast.   08/30/2017 at 900  . nystatin (NYSTATIN) powder Apply topically See admin instructions. Apply topically to groin every 12 hours as needed for rash/irritation   unknown  . ondansetron (ZOFRAN) 4 MG tablet Take 4 mg by mouth every 8 (eight) hours as needed for nausea or vomiting.   08/30/2017 at 2024  . pantoprazole (PROTONIX) 40 MG tablet Take 1 tablet (40 mg total) by mouth daily.   08/30/2017 at 900  . polyethylene glycol (MIRALAX /  GLYCOLAX) packet Take 17 g by mouth daily. Mix with 8-10 oz water and drink   08/30/2017 at 900  . potassium chloride SA (K-DUR,KLOR-CON) 20 MEQ tablet Take 20 mEq by mouth daily.   08/30/2017 at 900  . PRESCRIPTION MEDICATION See admin instructions. Administer normal saline 1000 ml @@ 25 ml/hr via clysis every shift (day, evening, night)   08/31/2017 at night  . traMADol (ULTRAM) 50 MG tablet Take 1 tablet (50 mg total) by mouth every 8 (eight) hours as needed for severe pain. (Patient taking differently: Take 50 mg by mouth See admin instructions. Take one tablet (50 mg) by mouth every 12 hours, may also take one tablet every 8 hours as needed for severe pain) 15  tablet 0 08/30/2017 at 2100  . feeding supplement, GLUCERNA SHAKE, (GLUCERNA SHAKE) LIQD Take 237 mLs by mouth 3 (three) times daily between meals. (Patient not taking: Reported on 08/15/2017)   Not Taking at Unknown time  . levETIRAcetam (KEPPRA) 750 MG tablet Take 2 tablets (1,500 mg total) by mouth 2 (two) times daily. (Patient not taking: Reported on 08/12/2017)   Not Taking at Unknown time    Assessment: 67 y/o female with large MCA distribution stroke in 04/2017 and resultant seizures admitted 08/21/2017 with AMS. Patient was started on multiple AEDs with refractory seizures. Transferred to ICU on 09/05/17 for intubation and burst suppression with propofol and midazolam.   PTA regimen: Keppra 1500 mg PO bid  Current AED regimen: Keppra 1500 mg IV q12h from PTA Vimpat 200 mg IV q12h Phenytoin 100 mg IV Q8h Phenobarbital 65 mg IV QHS  Midazolam 25 mg/hr  Propofol 30 mcg/kg/min   7/28 ~24h post-load phenytoin level 22.2 (albumin 3.6 and normal) and just above goal  Of note: both phenobarbital and phenytoin are strong CYP3A4 inducers and can enhance clearance of midazolam which is a CYP3A4 substrate.   Goal of Therapy:  Total level 10-20 mcg/ml Free level 1-2 mcg/ml  Plan:  - Continue phenytoin 100 mg IV q8h (~5 mg/kg/day based on ABW), dose is appropriate - Will plan to check a phenytoin level tomorrow at 1330  - Monitor renal function and albumin level   Albertina Parr, PharmD., BCPS Clinical Pharmacist Clinical phone for 09/06/17 until 3:30pm: [phone] If after 3:30pm, please refer to [facility] for unit-specific pharmacist

## 2017-09-06 NOTE — Progress Notes (Signed)
Palliative:  I met again today at Ebony Williams bedside. Her husband, daughter, son, father are at bedside. I mainly spoke with daughter, Ebony Williams, and husband, Ebony Williams. They understand from neurology that they are continuing to try and minimize seizures and although they have greatly decreased they are still present. They will continue high doses of versed through tomorrow. They understand that tomorrow is the day we will likely know more if the seizures have been controlled or not.   We have spoken at length over the past few days of varying scenarios that even if the seizures are controlled I am greatly concerned as to the damage that has been caused to her mental state. They know there is a high chance that she will not return to any sort of QOL and Ebony Williams and [place] know "she would not want to just be lying in a bed." We have discussed scenarios of successful extubation vs possibility she will not be able to extubate successfully. They will be prepared in these scenarios to have our help to make [name] comfortable at that time but will need support and guidance during this process.   Exam: Sedated on vent. No distress. Continuous EEG.   25 min  Vinie Sill, NP Palliative Medicine Team Pager # [phone] (M-F 8a-5p) Team Phone # [phone] (Nights/Weekends)

## 2017-09-06 NOTE — Progress Notes (Signed)
Subjective: On Versed at 15 mg/hr and propofol at 30 mcg/kg/min.  Objective: Current vital signs: BP 135/70   Pulse 82   Temp 97.7 F (36.5 C) (Axillary)   Resp 18   Ht '5\' 2"'$  (1.575 m)   Wt 65.8 kg (145 lb 1 oz)   SpO2 100%   BMI 26.53 kg/m  Vital signs in last 24 hours: Temp:  [97.5 F (36.4 C)-97.7 F (36.5 C)] 97.7 F (36.5 C) (08/01 0827) Pulse Rate:  [70-110] 82 (08/01 0745) Resp:  [14-21] 18 (08/01 0745) BP: (103-198)/(61-115) 135/70 (08/01 0745) SpO2:  [98 %-100 %] 100 % (08/01 0745) FiO2 (%):  [30 %-40 %] 30 % (08/01 0745) Weight:  [65.8 kg (145 lb 1 oz)] 65.8 kg (145 lb 1 oz) (08/01 0600)  Intake/Output from previous day: 07/31 0701 - 08/01 0700 In: 2465.6 [I.V.:2000.6; IV Piggyback:465] Out: 475 [Urine:475] Intake/Output this shift: No intake/output data recorded. Nutritional status:  Diet Order           Diet NPO time specified  Diet effective now          HEENT: [place]/AT Lungs: Intubated  Neurologic Exam: Sedated on Versed and propofol Ment: Sedated with no responses to external stimuli. CN: Face flaccidly symmetric. Motor/Sensory: Tone decreased all 4 extremities, with some residual increased tone RUE and RLE on sedation.    Lab Results: Results for orders placed or performed during the hospital encounter of 08/10/2017 (from the past 48 hour(s))  Glucose, capillary     Status: Abnormal   Collection Time: 09/04/17 11:30 AM  Result Value Ref Range   Glucose-Capillary 125 (H) 70 - 99 mg/dL   Comment 1 Notify RN    Comment 2 Document in Chart   Glucose, capillary     Status: Abnormal   Collection Time: 09/04/17  3:47 PM  Result Value Ref Range   Glucose-Capillary 194 (H) 70 - 99 mg/dL   Comment 1 Notify RN    Comment 2 Document in Chart   Glucose, capillary     Status: Abnormal   Collection Time: 09/04/17  8:07 PM  Result Value Ref Range   Glucose-Capillary 133 (H) 70 - 99 mg/dL   Comment 1 Notify RN    Comment 2 Document in Chart   Glucose,  capillary     Status: Abnormal   Collection Time: 09/04/17 11:43 PM  Result Value Ref Range   Glucose-Capillary 128 (H) 70 - 99 mg/dL   Comment 1 Notify RN    Comment 2 Document in Chart   Glucose, capillary     Status: Abnormal   Collection Time: 09/05/17  4:02 AM  Result Value Ref Range   Glucose-Capillary 128 (H) 70 - 99 mg/dL   Comment 1 Notify RN    Comment 2 Document in Chart   CBC with Differential/Platelet     Status: Abnormal   Collection Time: 09/05/17  4:55 AM  Result Value Ref Range   WBC 13.3 (H) 4.0 - 10.5 K/uL   RBC 4.95 3.87 - 5.11 MIL/uL   Hemoglobin 15.5 (H) 12.0 - 15.0 g/dL   HCT 45.7 36.0 - 46.0 %   MCV 92.3 78.0 - 100.0 fL   MCH 31.3 26.0 - 34.0 pg   MCHC 33.9 30.0 - 36.0 g/dL   RDW 13.2 11.5 - 15.5 %   Platelets 234 150 - 400 K/uL    Comment: PLATELET COUNT CONFIRMED BY SMEAR   Neutrophils Relative % 67 %   Neutro Abs 8.9 (  H) 1.7 - 7.7 K/uL   Lymphocytes Relative 26 %   Lymphs Abs 3.4 0.7 - 4.0 K/uL   Monocytes Relative 6 %   Monocytes Absolute 0.8 0.1 - 1.0 K/uL   Eosinophils Relative 0 %   Eosinophils Absolute 0.0 0.0 - 0.7 K/uL   Basophils Relative 0 %   Basophils Absolute 0.1 0.0 - 0.1 K/uL   Immature Granulocytes 1 %   Abs Immature Granulocytes 0.1 0.0 - 0.1 K/uL    Comment: Performed at [facility] [address], [place], [place] [address]  Basic metabolic panel     Status: Abnormal   Collection Time: 09/05/17  4:55 AM  Result Value Ref Range   Sodium 139 135 - 145 mmol/L   Potassium 3.7 3.5 - 5.1 mmol/L    Comment: DELTA CHECK NOTED   Chloride 107 98 - 111 mmol/L   CO2 24 22 - 32 mmol/L   Glucose, Bld 147 (H) 70 - 99 mg/dL   BUN 10 8 - 23 mg/dL   Creatinine, Ser 0.50 0.44 - 1.00 mg/dL   Calcium 9.4 8.9 - 10.3 mg/dL   GFR calc non Af Amer >60 >60 mL/min   GFR calc Af Amer >60 >60 mL/min    Comment: (NOTE) The eGFR has been calculated using the CKD EPI equation. This calculation has not been validated in all clinical  situations. eGFR's persistently <60 mL/min signify possible Chronic Kidney Disease.    Anion gap 8 5 - 15    Comment: Performed at [facility] [address] [place], [place] [address]  Magnesium     Status: None   Collection Time: 09/05/17  4:55 AM  Result Value Ref Range   Magnesium 2.0 1.7 - 2.4 mg/dL    Comment: Performed at [facility] [address], [place], [place] [address]  Glucose, capillary     Status: Abnormal   Collection Time: 09/05/17  7:19 AM  Result Value Ref Range   Glucose-Capillary 149 (H) 70 - 99 mg/dL  MRSA PCR Screening     Status: None   Collection Time: 09/05/17 10:46 AM  Result Value Ref Range   MRSA by PCR NEGATIVE NEGATIVE    Comment:        The GeneXpert MRSA Assay (FDA approved for NASAL specimens only), is one component of a comprehensive MRSA colonization surveillance program. It is not intended to diagnose MRSA infection nor to guide or monitor treatment for MRSA infections. Performed at [facility], [place] [address], [place], [place] [address]   Culture, Urine     Status: None   Collection Time: 09/05/17 11:27 AM  Result Value Ref Range   Specimen Description URINE, CATHETERIZED    Special Requests Immunocompromised    Culture      NO GROWTH Performed at [facility], [place] [address] [place], [place] [address]    Report Status 09/06/2017 FINAL   Culture, respiratory (non-expectorated)     Status: None (Preliminary result)   Collection Time: 09/05/17 11:51 AM  Result Value Ref Range   Specimen Description TRACHEAL ASPIRATE    Special Requests NONE    Gram Stain      MODERATE WBC PRESENT,BOTH PMN AND MONONUCLEAR NO ORGANISMS SEEN    Culture      CULTURE REINCUBATED FOR BETTER GROWTH Performed at [facility], [place] [address], [place], [place] [address]    Report Status PENDING   Triglycerides  Status: None   Collection Time: 09/05/17 12:21 PM  Result Value Ref Range   Triglycerides 120 <150  mg/dL    Comment: SLIGHT HEMOLYSIS Performed at [facility] [address] [place], [place] [address]   Procalcitonin - Baseline     Status: None   Collection Time: 09/05/17 12:21 PM  Result Value Ref Range   Procalcitonin 0.18 ng/mL    Comment:        Interpretation: PCT (Procalcitonin) <= 0.5 ng/mL: Systemic infection (sepsis) is not likely. Local bacterial infection is possible. (NOTE)       Sepsis PCT Algorithm           Lower Respiratory Tract                                      Infection PCT Algorithm    ----------------------------     ----------------------------         PCT < 0.25 ng/mL                PCT < 0.10 ng/mL         Strongly encourage             Strongly discourage   discontinuation of antibiotics    initiation of antibiotics    ----------------------------     -----------------------------       PCT 0.25 - 0.50 ng/mL            PCT 0.10 - 0.25 ng/mL               OR       >80% decrease in PCT            Discourage initiation of                                            antibiotics      Encourage discontinuation           of antibiotics    ----------------------------     -----------------------------         PCT >= 0.50 ng/mL              PCT 0.26 - 0.50 ng/mL               AND        <80% decrease in PCT             Encourage initiation of                                             antibiotics       Encourage continuation           of antibiotics    ----------------------------     -----------------------------        PCT >= 0.50 ng/mL                  PCT > 0.50 ng/mL               AND         increase in PCT  Strongly encourage                                      initiation of antibiotics    Strongly encourage escalation           of antibiotics                                     -----------------------------                                           PCT <= 0.25 ng/mL                                                 OR                                         > 80% decrease in PCT                                     Discontinue / Do not initiate                                             antibiotics Performed at [facility], [place] [address] [place], [place] [address]   I-STAT 3, arterial blood gas (G3+)     Status: Abnormal   Collection Time: 09/05/17 12:48 PM  Result Value Ref Range   pH, Arterial 7.325 (L) 7.350 - 7.450   pCO2 arterial 46.2 32.0 - 48.0 mmHg   pO2, Arterial 156.0 (H) 83.0 - 108.0 mmHg   Bicarbonate 24.1 20.0 - 28.0 mmol/L   TCO2 26 22 - 32 mmol/L   O2 Saturation 99.0 %   Acid-base deficit 2.0 0.0 - 2.0 mmol/L   Patient temperature 98.3 F    Collection site RADIAL, ALLEN'S TEST ACCEPTABLE    Drawn by RT    Sample type ARTERIAL   Glucose, capillary     Status: Abnormal   Collection Time: 09/05/17  4:34 PM  Result Value Ref Range   Glucose-Capillary 140 (H) 70 - 99 mg/dL  Glucose, capillary     Status: Abnormal   Collection Time: 09/05/17  7:45 PM  Result Value Ref Range   Glucose-Capillary 127 (H) 70 - 99 mg/dL  Glucose, capillary     Status: None   Collection Time: 09/05/17 11:33 PM  Result Value Ref Range   Glucose-Capillary 96 70 - 99 mg/dL  CBC with Differential/Platelet     Status: Abnormal   Collection Time: 09/06/17  2:52 AM  Result Value Ref Range   WBC 10.9 (H) 4.0 - 10.5 K/uL   RBC 4.51 3.87 - 5.11 MIL/uL   Hemoglobin 14.1 12.0 - 15.0 g/dL   HCT 43.3 36.0 - 46.0 %   MCV 96.0  78.0 - 100.0 fL   MCH 31.3 26.0 - 34.0 pg   MCHC 32.6 30.0 - 36.0 g/dL   RDW 13.2 11.5 - 15.5 %   Platelets 184 150 - 400 K/uL   Neutrophils Relative % 46 %   Neutro Abs 5.0 1.7 - 7.7 K/uL   Lymphocytes Relative 41 %   Lymphs Abs 4.5 (H) 0.7 - 4.0 K/uL   Monocytes Relative 9 %   Monocytes Absolute 1.0 0.1 - 1.0 K/uL   Eosinophils Relative 3 %   Eosinophils Absolute 0.3 0.0 - 0.7 K/uL   Basophils Relative 1 %   Basophils Absolute 0.1 0.0 - 0.1 K/uL   Immature Granulocytes 0 %    Abs Immature Granulocytes 0.0 0.0 - 0.1 K/uL    Comment: Performed at [facility] [address], [place], [place] [address]  Basic metabolic panel     Status: Abnormal   Collection Time: 09/06/17  2:52 AM  Result Value Ref Range   Sodium 140 135 - 145 mmol/L   Potassium 3.7 3.5 - 5.1 mmol/L   Chloride 109 98 - 111 mmol/L   CO2 24 22 - 32 mmol/L   Glucose, Bld 107 (H) 70 - 99 mg/dL   BUN 11 8 - 23 mg/dL   Creatinine, Ser 0.56 0.44 - 1.00 mg/dL   Calcium 9.1 8.9 - 10.3 mg/dL   GFR calc non Af Amer >60 >60 mL/min   GFR calc Af Amer >60 >60 mL/min    Comment: (NOTE) The eGFR has been calculated using the CKD EPI equation. This calculation has not been validated in all clinical situations. eGFR's persistently <60 mL/min signify possible Chronic Kidney Disease.    Anion gap 7 5 - 15    Comment: Performed at [facility] [address], [place], [place] [address]  Glucose, capillary     Status: None   Collection Time: 09/06/17  3:35 AM  Result Value Ref Range   Glucose-Capillary 89 70 - 99 mg/dL  Glucose, capillary     Status: Abnormal   Collection Time: 09/06/17  8:18 AM  Result Value Ref Range   Glucose-Capillary 122 (H) 70 - 99 mg/dL    Recent Results (from the past 240 hour(s))  MRSA PCR Screening     Status: None   Collection Time: 08/13/2017 11:27 PM  Result Value Ref Range Status   MRSA by PCR NEGATIVE NEGATIVE Final    Comment:        The GeneXpert MRSA Assay (FDA approved for NASAL specimens only), is one component of a comprehensive MRSA colonization surveillance program. It is not intended to diagnose MRSA infection nor to guide or monitor treatment for MRSA infections. Performed at [facility], [place] [address] [place], [place] [address]   Culture, blood (routine x 2)     Status: None (Preliminary result)   Collection Time: 09/02/17 12:00 PM  Result Value Ref Range Status   Specimen Description BLOOD LEFT HAND  Final   Special Requests    Final    BOTTLES DRAWN AEROBIC ONLY Blood Culture adequate volume   Culture   Final    NO GROWTH 3 DAYS Performed at [facility], [place] [address] [place],  [address]    Report Status PENDING  Incomplete  Culture, blood (routine x 2)     Status: None (Preliminary result)   Collection Time: 09/02/17 12:05 PM  Result Value Ref Range Status   Specimen Description  BLOOD RIGHT HAND  Final   Special Requests   Final    BOTTLES DRAWN AEROBIC ONLY Blood Culture adequate volume   Culture   Final    NO GROWTH 3 DAYS Performed at [facility], [address], [place], [place] [address]    Report Status PENDING  Incomplete  MRSA PCR Screening     Status: None   Collection Time: 09/05/17 10:46 AM  Result Value Ref Range Status   MRSA by PCR NEGATIVE NEGATIVE Final    Comment:        The GeneXpert MRSA Assay (FDA approved for NASAL specimens only), is one component of a comprehensive MRSA colonization surveillance program. It is not intended to diagnose MRSA infection nor to guide or monitor treatment for MRSA infections. Performed at [facility], [place] [address] [place], [place] [address]   Culture, Urine     Status: None   Collection Time: 09/05/17 11:27 AM  Result Value Ref Range Status   Specimen Description URINE, CATHETERIZED  Final   Special Requests Immunocompromised  Final   Culture   Final    NO GROWTH Performed at [facility], [place] [address] [place], [place] [address]    Report Status 09/06/2017 FINAL  Final  Culture, respiratory (non-expectorated)     Status: None (Preliminary result)   Collection Time: 09/05/17 11:51 AM  Result Value Ref Range Status   Specimen Description TRACHEAL ASPIRATE  Final   Special Requests NONE  Final   Gram Stain   Final    MODERATE WBC PRESENT,BOTH PMN AND MONONUCLEAR NO ORGANISMS SEEN    Culture   Final    CULTURE REINCUBATED FOR BETTER GROWTH Performed at [facility], [address]  [place], [place] [address]    Report Status PENDING  Incomplete    Lipid Panel Recent Labs    09/05/17 1221  TRIG 120    Studies/Results: Dg Chest Port 1 View  Result Date: 09/05/2017 CLINICAL DATA:  Ventilator support. EXAM: PORTABLE CHEST 1 VIEW COMPARISON:  08/31/2017 FINDINGS: Endotracheal tube tip is 1 cm above the carina. Orogastric or nasogastric tube enters the stomach. The lungs are clear except for mild atelectasis in the left lower lobe. No edema or visible effusion. Atherosclerosis and tortuosity of the aorta. IMPRESSION: Endotracheal tube and orogastric tube appear well positioned. Mild atelectasis in the left lower lobe. Electronically Signed   By: Nelson Chimes M.D.   On: 09/05/2017 11:46   Dg Abd Portable 1v  Result Date: 09/05/2017 CLINICAL DATA:  Orogastric placement. EXAM: PORTABLE ABDOMEN - 1 VIEW COMPARISON:  None. FINDINGS: Orogastric tube enters the stomach with its tip in the fundus. Gas pattern is unremarkable. No abnormal calcifications or bone findings. IMPRESSION: Orogastric tube tip in the gastric fundus. Gas pattern unremarkable. Electronically Signed   By: Nelson Chimes M.D.   On: 09/05/2017 11:47    Medications:  Scheduled: . aspirin  81 mg Oral Daily  . atorvastatin  40 mg Oral Daily  . chlorhexidine gluconate (MEDLINE KIT)  15 mL Mouth Rinse BID  . clopidogrel  75 mg Oral Daily  . enoxaparin (LOVENOX) injection  40 mg Subcutaneous Q24H  . fentaNYL (SUBLIMAZE) injection  50 mcg Intravenous Once  . mouth rinse  15 mL Mouth Rinse 10 times per day  . PHENObarbital  65 mg Intravenous QHS  . phenytoin (DILANTIN) IV  100 mg Intravenous Q8H  . sodium chloride flush  3 mL Intravenous  Q12H   Continuous: . sodium chloride Stopped (09/05/17 1400)  . cefTRIAXone (ROCEPHIN)  IV Stopped (09/04/17 1146)  . dextrose 5 % and 0.45 % NaCl with KCl 20 mEq/L 75 mL/hr at 09/06/17 0431  . famotidine (PEPCID) IV Stopped (09/05/17 1434)  . fentaNYL infusion INTRAVENOUS    .  lacosamide (VIMPAT) IV 200 mg (09/06/17 0926)  . levETIRAcetam 1,500 mg (09/06/17 0817)  . midazolam (VERSED) infusion 15 mg/hr (09/06/17 0741)  . propofol (DIPRIVAN) infusion 30 mcg/kg/min (09/06/17 0433)   LTM EEG preliminary review: In burst suppression at approximately 1:2 ratio. Some sharp waves seen during suppressed segments.  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 Overall presentation is most consistent with ongoing seizure activity since Wednesday,7/24 which has been refractory to multiple anticonvulsants 1.Now in burst suppression on Versed and propofol. Continuing phenobarbital, Vimpat,Keppra and Dilantin.  2. Prior history of seizures 3. History of leftMCA stroke  Recommendations: 1.Continue with LTM EEG.  2.ContinueDilantinwith pharmacy to dose. 3. Continuephenobarbital,Vimpat and Keppra 4.Continue burst suppression protocol for an additional 24 hours. Plan is to wean off Versed and propofol tomorrow and re-assess EEG.   5. Continuous pulse oximetry and cardiac telemetry.  6. Q2H neuro checks. 7. Increasing Versed gtt to 25 mg/hr to further optimize EEG tracings for burst suppression.   35 minutes spent in the neurological evaluation and management of this critically ill patient with status epilepticus. Time included EEG review and family education.      LOS: 5 days   '@Electronically'$  signed: Dr. Kerney Elbe 09/06/2017  9:31 AM

## 2017-09-06 NOTE — Progress Notes (Signed)
Nutrition Follow-up  DOCUMENTATION CODES:   Non-severe (moderate) malnutrition in context of chronic illness  INTERVENTION:   Initiate TF via OGT with Vital High Protein at goal rate of 50 ml/h (1200 ml per day) to provide 1200 kcals, 105 gm protein, 1003 ml free water daily. Regimen with inclusion of current propofol rate provides 1530 kcals, meeting >100% of estimated needs  -Liquid MVI daily, as TF does not meet 100% of RDI's  NUTRITION DIAGNOSIS:   Moderate Malnutrition related to chronic illness(recent stroke) as evidenced by mild fat depletion, mild muscle depletion, percent weight loss.  Ongoing  GOAL:   Patient will meet greater than or equal to 90% of their needs  Progressing  MONITOR:   Vent status, Labs, Weight trends, TF tolerance, Skin, I & O's  REASON FOR ASSESSMENT:   Consult Enteral/tube feeding initiation and management  ASSESSMENT:   Patient with PMH recent stroke in March, seizures, HTN, HLD, recent UTI, presents with metabolic encephaloapthy vs status epilepticus and UTI.  7/31 - pt transferred to ICU for intubation and burst suppression protocol  Patient is rmains intubated on ventilator support. OGT in place (confirmed placement in stomach).  MV: 7.5 L/min Temp (24hrs), Avg:97.6 F (36.4 C), Min:97.5 F (36.4 C), Max:97.7 F (36.5 C)  Propofol: 12.5 ml/hr (330 kcals)  Case discussed with RN, who reports neurology confirmed that it is ok to start tube feedings today. Propofol rate to remain stable today.   Spoke with pt and daughter at bedside, who are eager to start TF. Discussed how pt will receive nutrition while she is intubated. RN assisted with explaining functionality of feeding pump to pt family members.   Per PCCM notes, plan for 48 hours of aggressive care in hopes to stop seizures activity. Pt family does not prolonged aggressive therapy ginen overall poor prognosis. Palliative care consult pending.   Labs reviewed: CBGS: 89-127.    Diet Order:   Diet Order           Diet NPO time specified  Diet effective now          EDUCATION NEEDS:   Not appropriate for education at this time  Skin:  Skin Assessment: Reviewed RN Assessment  Last BM:  09/05/17  Height:   Ht Readings from Last 1 Encounters:  08/21/2017 5\' 2"  (1.575 m)    Weight:   Wt Readings from Last 1 Encounters:  09/06/17 145 lb 1 oz (65.8 kg)    Ideal Body Weight:  50 kg  BMI:  Body mass index is 26.53 kg/m.  Estimated Nutritional Needs:   Kcal:  1371  Protein:  100-115 grams  Fluid:  > 1.4 L    Nikos Anglemyer A. Jimmye Norman, RD, LDN, CDE Pager: [phone] After hours Pager: [phone]

## 2017-09-06 NOTE — Progress Notes (Signed)
Called in to check LTM not running. Restarted computer, LTM resumed.

## 2017-09-06 NOTE — Progress Notes (Signed)
Called the on-call EEG tech, attempted to troubleshoot over the phone. Unsuccessful. She stated "I'll be in in a little bit". Continuing to monitor the pt. Vent was alarming with no obvious movement done to the pt or seen spontaneously- Propofol increased to 30 mck/kg/hr as documented.

## 2017-09-06 DEATH — deceased

## 2017-09-07 DIAGNOSIS — Z515 Encounter for palliative care: Secondary | ICD-10-CM

## 2017-09-07 LAB — CBC WITH DIFFERENTIAL/PLATELET
Abs Immature Granulocytes: 0 10*3/uL (ref 0.0–0.1)
BASOS ABS: 0 10*3/uL (ref 0.0–0.1)
Basophils Relative: 0 %
Eosinophils Absolute: 0.2 10*3/uL (ref 0.0–0.7)
Eosinophils Relative: 3 %
HCT: 41 % (ref 36.0–46.0)
HEMOGLOBIN: 13.1 g/dL (ref 12.0–15.0)
IMMATURE GRANULOCYTES: 0 %
LYMPHS ABS: 1.7 10*3/uL (ref 0.7–4.0)
LYMPHS PCT: 19 %
MCH: 31.3 pg (ref 26.0–34.0)
MCHC: 32 g/dL (ref 30.0–36.0)
MCV: 97.9 fL (ref 78.0–100.0)
Monocytes Absolute: 0.6 10*3/uL (ref 0.1–1.0)
Monocytes Relative: 7 %
NEUTROS PCT: 71 %
Neutro Abs: 6.4 10*3/uL (ref 1.7–7.7)
Platelets: 147 10*3/uL — ABNORMAL LOW (ref 150–400)
RBC: 4.19 MIL/uL (ref 3.87–5.11)
RDW: 13.4 % (ref 11.5–15.5)
WBC: 8.9 10*3/uL (ref 4.0–10.5)

## 2017-09-07 LAB — BLOOD GAS, ARTERIAL
ACID-BASE DEFICIT: 3.2 mmol/L — AB (ref 0.0–2.0)
BICARBONATE: 21.7 mmol/L (ref 20.0–28.0)
Drawn by: 414221
FIO2: 30
LHR: 18 {breaths}/min
O2 Saturation: 98.7 %
PEEP/CPAP: 5 cmH2O
Patient temperature: 98.2
VT: 400 mL
pCO2 arterial: 41.5 mmHg (ref 32.0–48.0)
pH, Arterial: 7.338 — ABNORMAL LOW (ref 7.350–7.450)
pO2, Arterial: 131 mmHg — ABNORMAL HIGH (ref 83.0–108.0)

## 2017-09-07 LAB — MAGNESIUM: MAGNESIUM: 1.6 mg/dL — AB (ref 1.7–2.4)

## 2017-09-07 LAB — CULTURE, BLOOD (ROUTINE X 2)
CULTURE: NO GROWTH
Culture: NO GROWTH
Special Requests: ADEQUATE
Special Requests: ADEQUATE

## 2017-09-07 LAB — COMPREHENSIVE METABOLIC PANEL
ALK PHOS: 61 U/L (ref 38–126)
ALT: 12 U/L (ref 0–44)
AST: 18 U/L (ref 15–41)
Albumin: 2.5 g/dL — ABNORMAL LOW (ref 3.5–5.0)
Anion gap: 8 (ref 5–15)
BILIRUBIN TOTAL: 0.3 mg/dL (ref 0.3–1.2)
BUN: 12 mg/dL (ref 8–23)
CALCIUM: 8.4 mg/dL — AB (ref 8.9–10.3)
CO2: 21 mmol/L — ABNORMAL LOW (ref 22–32)
CREATININE: 0.46 mg/dL (ref 0.44–1.00)
Chloride: 111 mmol/L (ref 98–111)
GFR calc Af Amer: >60 mL/min (ref >60)
Glucose, Bld: 137 mg/dL — ABNORMAL HIGH (ref 70–99)
Potassium: 4 mmol/L (ref 3.5–5.1)
Sodium: 140 mmol/L (ref 135–145)
TOTAL PROTEIN: 4.9 g/dL — AB (ref 6.5–8.1)

## 2017-09-07 LAB — PROTIME-INR
INR: 1.15
PROTHROMBIN TIME: 14.6 s (ref 11.4–15.2)

## 2017-09-07 LAB — CK: CK TOTAL: 33 U/L — AB (ref 38–234)

## 2017-09-07 LAB — PHOSPHORUS: Phosphorus: 4.1 mg/dL (ref 2.5–4.6)

## 2017-09-07 LAB — CULTURE, RESPIRATORY: CULTURE: NORMAL

## 2017-09-07 LAB — GLUCOSE, CAPILLARY
GLUCOSE-CAPILLARY: 125 mg/dL — AB (ref 70–99)
Glucose-Capillary: 112 mg/dL — ABNORMAL HIGH (ref 70–99)
Glucose-Capillary: 135 mg/dL — ABNORMAL HIGH (ref 70–99)

## 2017-09-07 LAB — CULTURE, RESPIRATORY W GRAM STAIN

## 2017-09-07 LAB — PHENYTOIN LEVEL, TOTAL: Phenytoin Lvl: 15.4 ug/mL (ref 10.0–20.0)

## 2017-09-07 MED ORDER — ACETAMINOPHEN 160 MG/5ML PO SOLN
650.0000 mg | ORAL | Status: DC | PRN
  Administered 2017-09-07 – 2017-09-10 (×2): 650 mg
  Filled 2017-09-07 (×2): qty 20.3

## 2017-09-07 MED ORDER — MAGNESIUM SULFATE 2 GM/50ML IV SOLN
2.0000 g | Freq: Once | INTRAVENOUS | Status: AC
  Administered 2017-09-07: 2 g via INTRAVENOUS
  Filled 2017-09-07: qty 50

## 2017-09-07 MED ORDER — HYDRALAZINE HCL 20 MG/ML IJ SOLN
10.0000 mg | INTRAMUSCULAR | Status: DC | PRN
  Administered 2017-09-07 – 2017-09-11 (×2): 10 mg via INTRAVENOUS
  Filled 2017-09-07 (×2): qty 1

## 2017-09-07 NOTE — Progress Notes (Signed)
EEG with Versed at 35 mg/hr still not in a consistent burst suppression pattern. Increasing Versed gtt to 45 mg/hr. Will discuss with Dr. Rory Percy in sign out.   Electronically signed: Dr. Kerney Elbe

## 2017-09-07 NOTE — Progress Notes (Addendum)
eLink Physician-Brief Progress Note Patient Name: Ebony Williams DOB: 12/11/1950 MRN: [id number]   Date of Service  09/07/2017  HPI/Events of Note  Pt grimacing and ET tube clenching per RN, Temp spike to 101.3  eICU Interventions  Fentanyl infusion at 25 mics/ hr, prn tylenol for fever        Frederik Pear 09/07/2017, 7:43 PM

## 2017-09-07 NOTE — Progress Notes (Signed)
EEG maint complete. No skin breakdown °

## 2017-09-07 NOTE — Progress Notes (Signed)
Subjective: Sedated on Versed and propofol.   Objective: Current vital signs: BP (!) 161/69   Pulse 81   Temp 97.9 F (36.6 C) (Oral)   Resp 19   Ht _0  (1.575 m)   Wt 64.8 kg (142 lb 13.7 oz)   SpO2 100%   BMI 26.13 kg/m  Vital signs in last 24 hours: Temp:  [97.5 F (36.4 C)-98.4 F (36.9 C)] 97.9 F (36.6 C) (08/02 0836) Pulse Rate:  [74-85] 81 (08/02 1000) Resp:  [18-20] 19 (08/02 1000) BP: (121-162)/(64-80) 161/69 (08/02 1000) SpO2:  [100 %] 100 % (08/02 1000) FiO2 (%):  [30 %] 30 % (08/02 0757) Weight:  [64.8 kg (142 lb 13.7 oz)] 64.8 kg (142 lb 13.7 oz) (08/02 0500)  Intake/Output from previous day: 08/01 0701 - 08/02 0700 In: 3911.6 [I.V.:2584.1; NG/GT:887.5; IV Piggyback:440] Out: 925 [Urine:925] Intake/Output this shift: Total I/O In: 638.9 [I.V.:294; NG/GT:200; IV Piggyback:144.8] Out: -  Nutritional status:  Diet Order           Diet NPO time specified  Diet effective now          HEENT: [place]/AT Lungs: Intubated  Neurologic Exam: Sedated on Versed and propofol Ment: Sedated with no responses to external stimuli. QI:ONGE flaccidly symmetric. Motor/Sensory: Tone decreased all 4 extremities, with some residual increased tone to RUE and RLE on sedation.    Lab Results: Results for orders placed or performed during the hospital encounter of 08/11/2017 (from the past 48 hour(s))  MRSA PCR Screening     Status: None   Collection Time: 09/05/17 10:46 AM  Result Value Ref Range   MRSA by PCR NEGATIVE NEGATIVE    Comment:        The GeneXpert MRSA Assay (FDA approved for NASAL specimens only), is one component of a comprehensive MRSA colonization surveillance program. It is not intended to diagnose MRSA infection nor to guide or monitor treatment for MRSA infections. Performed at [facility], [place] [address], [place], [place] [address]   Culture, Urine     Status: None   Collection Time: 09/05/17 11:27 AM  Result Value Ref Range    Specimen Description URINE, CATHETERIZED    Special Requests Immunocompromised    Culture      NO GROWTH Performed at [facility], [place] [address] [place], [place] [address]    Report Status 09/06/2017 FINAL   Culture, respiratory (non-expectorated)     Status: None   Collection Time: 09/05/17 11:51 AM  Result Value Ref Range   Specimen Description TRACHEAL ASPIRATE    Special Requests NONE    Gram Stain      MODERATE WBC PRESENT,BOTH PMN AND MONONUCLEAR NO ORGANISMS SEEN    Culture      FEW Consistent with normal respiratory flora. Performed at [facility], [place] [address], [place], [place] [address]    Report Status 09/07/2017 FINAL   Triglycerides     Status: None   Collection Time: 09/05/17 12:21 PM  Result Value Ref Range   Triglycerides 120 <150 mg/dL    Comment: SLIGHT HEMOLYSIS Performed at [facility] [address] [place], [place] [address]   Procalcitonin - Baseline     Status: None   Collection Time: 09/05/17 12:21 PM  Result Value Ref Range   Procalcitonin 0.18 ng/mL    Comment:        Interpretation: PCT (Procalcitonin) <= 0.5 ng/mL: Systemic infection (sepsis) is not likely. Local bacterial  infection is possible. (NOTE)       Sepsis PCT Algorithm           Lower Respiratory Tract                                      Infection PCT Algorithm    ----------------------------     ----------------------------         PCT < 0.25 ng/mL                PCT < 0.10 ng/mL         Strongly encourage             Strongly discourage   discontinuation of antibiotics    initiation of antibiotics    ----------------------------     -----------------------------       PCT 0.25 - 0.50 ng/mL            PCT 0.10 - 0.25 ng/mL               OR       >80% decrease in PCT            Discourage initiation of                                            antibiotics      Encourage discontinuation           of antibiotics    ----------------------------      -----------------------------         PCT >= 0.50 ng/mL              PCT 0.26 - 0.50 ng/mL               AND        <80% decrease in PCT             Encourage initiation of                                             antibiotics       Encourage continuation           of antibiotics    ----------------------------     -----------------------------        PCT >= 0.50 ng/mL                  PCT > 0.50 ng/mL               AND         increase in PCT                  Strongly encourage                                      initiation of antibiotics    Strongly encourage escalation           of antibiotics                                     -----------------------------  PCT <= 0.25 ng/mL                                                 OR                                        > 80% decrease in PCT                                     Discontinue / Do not initiate                                             antibiotics Performed at [facility], [place] [address] [place], [place] [address]   I-STAT 3, arterial blood gas (G3+)     Status: Abnormal   Collection Time: 09/05/17 12:48 PM  Result Value Ref Range   pH, Arterial 7.325 (L) 7.350 - 7.450   pCO2 arterial 46.2 32.0 - 48.0 mmHg   pO2, Arterial 156.0 (H) 83.0 - 108.0 mmHg   Bicarbonate 24.1 20.0 - 28.0 mmol/L   TCO2 26 22 - 32 mmol/L   O2 Saturation 99.0 %   Acid-base deficit 2.0 0.0 - 2.0 mmol/L   Patient temperature 98.3 F    Collection site RADIAL, ALLEN'S TEST ACCEPTABLE    Drawn by RT    Sample type ARTERIAL   Glucose, capillary     Status: Abnormal   Collection Time: 09/05/17  4:34 PM  Result Value Ref Range   Glucose-Capillary 140 (H) 70 - 99 mg/dL  Glucose, capillary     Status: Abnormal   Collection Time: 09/05/17  7:45 PM  Result Value Ref Range   Glucose-Capillary 127 (H) 70 - 99 mg/dL  Glucose, capillary     Status: None   Collection Time: 09/05/17 11:33 PM  Result  Value Ref Range   Glucose-Capillary 96 70 - 99 mg/dL  CBC with Differential/Platelet     Status: Abnormal   Collection Time: 09/06/17  2:52 AM  Result Value Ref Range   WBC 10.9 (H) 4.0 - 10.5 K/uL   RBC 4.51 3.87 - 5.11 MIL/uL   Hemoglobin 14.1 12.0 - 15.0 g/dL   HCT 43.3 36.0 - 46.0 %   MCV 96.0 78.0 - 100.0 fL   MCH 31.3 26.0 - 34.0 pg   MCHC 32.6 30.0 - 36.0 g/dL   RDW 13.2 11.5 - 15.5 %   Platelets 184 150 - 400 K/uL   Neutrophils Relative % 46 %   Neutro Abs 5.0 1.7 - 7.7 K/uL   Lymphocytes Relative 41 %   Lymphs Abs 4.5 (H) 0.7 - 4.0 K/uL   Monocytes Relative 9 %   Monocytes Absolute 1.0 0.1 - 1.0 K/uL   Eosinophils Relative 3 %   Eosinophils Absolute 0.3 0.0 - 0.7 K/uL   Basophils Relative 1 %   Basophils Absolute 0.1 0.0 - 0.1 K/uL   Immature Granulocytes 0 %   Abs Immature Granulocytes 0.0 0.0 - 0.1 K/uL    Comment: Performed at [facility],  [address], [place], [place] [address]  Basic metabolic panel     Status: Abnormal   Collection Time: 09/06/17  2:52 AM  Result Value Ref Range   Sodium 140 135 - 145 mmol/L   Potassium 3.7 3.5 - 5.1 mmol/L   Chloride 109 98 - 111 mmol/L   CO2 24 22 - 32 mmol/L   Glucose, Bld 107 (H) 70 - 99 mg/dL   BUN 11 8 - 23 mg/dL   Creatinine, Ser 0.56 0.44 - 1.00 mg/dL   Calcium 9.1 8.9 - 10.3 mg/dL   GFR calc non Af Amer >60 >60 mL/min   GFR calc Af Amer >60 >60 mL/min    Comment: (NOTE) The eGFR has been calculated using the CKD EPI equation. This calculation has not been validated in all clinical situations. eGFR's persistently <60 mL/min signify possible Chronic Kidney Disease.    Anion gap 7 5 - 15    Comment: Performed at [facility] [address], [place], [place] [address]  CK     Status: Abnormal   Collection Time: 09/06/17  2:52 AM  Result Value Ref Range   Total CK 23 (L) 38 - 234 U/L    Comment: Performed at [facility], [place] [address] [place], [place] [address]  Glucose, capillary      Status: None   Collection Time: 09/06/17  3:35 AM  Result Value Ref Range   Glucose-Capillary 89 70 - 99 mg/dL  Glucose, capillary     Status: Abnormal   Collection Time: 09/06/17  8:18 AM  Result Value Ref Range   Glucose-Capillary 122 (H) 70 - 99 mg/dL  Glucose, capillary     Status: Abnormal   Collection Time: 09/06/17 12:36 PM  Result Value Ref Range   Glucose-Capillary 104 (H) 70 - 99 mg/dL  Glucose, capillary     Status: Abnormal   Collection Time: 09/06/17  5:32 PM  Result Value Ref Range   Glucose-Capillary 112 (H) 70 - 99 mg/dL  Glucose, capillary     Status: Abnormal   Collection Time: 09/06/17  8:12 PM  Result Value Ref Range   Glucose-Capillary 135 (H) 70 - 99 mg/dL  Glucose, capillary     Status: Abnormal   Collection Time: 09/06/17 11:58 PM  Result Value Ref Range   Glucose-Capillary 125 (H) 70 - 99 mg/dL  CBC with Differential/Platelet     Status: Abnormal   Collection Time: 09/07/17  3:17 AM  Result Value Ref Range   WBC 8.9 4.0 - 10.5 K/uL   RBC 4.19 3.87 - 5.11 MIL/uL   Hemoglobin 13.1 12.0 - 15.0 g/dL   HCT 41.0 36.0 - 46.0 %   MCV 97.9 78.0 - 100.0 fL   MCH 31.3 26.0 - 34.0 pg   MCHC 32.0 30.0 - 36.0 g/dL   RDW 13.4 11.5 - 15.5 %   Platelets 147 (L) 150 - 400 K/uL   Neutrophils Relative % 71 %   Neutro Abs 6.4 1.7 - 7.7 K/uL   Lymphocytes Relative 19 %   Lymphs Abs 1.7 0.7 - 4.0 K/uL   Monocytes Relative 7 %   Monocytes Absolute 0.6 0.1 - 1.0 K/uL   Eosinophils Relative 3 %   Eosinophils Absolute 0.2 0.0 - 0.7 K/uL   Basophils Relative 0 %   Basophils Absolute 0.0 0.0 - 0.1 K/uL   Immature Granulocytes 0 %   Abs Immature Granulocytes 0.0 0.0 - 0.1 K/uL    Comment:  Performed at [facility], [place] [address], [place], [place] [address]  Comprehensive metabolic panel     Status: Abnormal   Collection Time: 09/07/17  3:17 AM  Result Value Ref Range   Sodium 140 135 - 145 mmol/L   Potassium 4.0 3.5 - 5.1 mmol/L   Chloride 111 98 - 111 mmol/L    CO2 21 (L) 22 - 32 mmol/L   Glucose, Bld 137 (H) 70 - 99 mg/dL   BUN 12 8 - 23 mg/dL   Creatinine, Ser 0.46 0.44 - 1.00 mg/dL   Calcium 8.4 (L) 8.9 - 10.3 mg/dL   Total Protein 4.9 (L) 6.5 - 8.1 g/dL   Albumin 2.5 (L) 3.5 - 5.0 g/dL   AST 18 15 - 41 U/L   ALT 12 0 - 44 U/L   Alkaline Phosphatase 61 38 - 126 U/L   Total Bilirubin 0.3 0.3 - 1.2 mg/dL   GFR calc non Af Amer >60 >60 mL/min   GFR calc Af Amer >60 >60 mL/min    Comment: (NOTE) The eGFR has been calculated using the CKD EPI equation. This calculation has not been validated in all clinical situations. eGFR's persistently <60 mL/min signify possible Chronic Kidney Disease.    Anion gap 8 5 - 15    Comment: Performed at [facility] [address] [place], [place] [address]  CK     Status: Abnormal   Collection Time: 09/07/17  3:17 AM  Result Value Ref Range   Total CK 33 (L) 38 - 234 U/L    Comment: Performed at [facility], [place] [address], [place], [place] [address]  Magnesium     Status: Abnormal   Collection Time: 09/07/17  3:17 AM  Result Value Ref Range   Magnesium 1.6 (L) 1.7 - 2.4 mg/dL    Comment: Performed at [facility] [address], [place], [place] [address]  Phosphorus     Status: None   Collection Time: 09/07/17  3:17 AM  Result Value Ref Range   Phosphorus 4.1 2.5 - 4.6 mg/dL    Comment: Performed at [facility] [address], [place], [place] [address]  Protime-INR     Status: None   Collection Time: 09/07/17  3:17 AM  Result Value Ref Range   Prothrombin Time 14.6 11.4 - 15.2 seconds   INR 1.15     Comment: Performed at [facility] [address] [place], [place] [address]  Blood gas, arterial     Status: Abnormal   Collection Time: 09/07/17  4:18 AM  Result Value Ref Range   FIO2 30.00    Delivery systems VENTILATOR    Mode PRESSURE REGULATED VOLUME CONTROL    VT 400 mL   LHR 18 resp/min   Peep/cpap 5.0 cm H20   pH, Arterial 7.338 (L) 7.350 -  7.450   pCO2 arterial 41.5 32.0 - 48.0 mmHg   pO2, Arterial 131 (H) 83.0 - 108.0 mmHg   Bicarbonate 21.7 20.0 - 28.0 mmol/L   Acid-base deficit 3.2 (H) 0.0 - 2.0 mmol/L   O2 Saturation 98.7 %   Patient temperature 98.2    Collection site LEFT RADIAL    Drawn by [id number]    Sample type ARTERIAL DRAW    Allens test (pass/fail) PASS PASS    Recent Results (from the past 240 hour(s))  MRSA PCR Screening     Status: None   Collection Time: 08/14/2017  11:27 PM  Result Value Ref Range Status   MRSA by PCR NEGATIVE NEGATIVE Final    Comment:        The GeneXpert MRSA Assay (FDA approved for NASAL specimens only), is one component of a comprehensive MRSA colonization surveillance program. It is not intended to diagnose MRSA infection nor to guide or monitor treatment for MRSA infections. Performed at [facility], [place] [address] [place], [place] [address]   Culture, blood (routine x 2)     Status: None (Preliminary result)   Collection Time: 09/02/17 12:00 PM  Result Value Ref Range Status   Specimen Description BLOOD LEFT HAND  Final   Special Requests   Final    BOTTLES DRAWN AEROBIC ONLY Blood Culture adequate volume   Culture   Final    NO GROWTH 4 DAYS Performed at [facility], [place] [address], [place], [place] [address]    Report Status PENDING  Incomplete  Culture, blood (routine x 2)     Status: None (Preliminary result)   Collection Time: 09/02/17 12:05 PM  Result Value Ref Range Status   Specimen Description BLOOD RIGHT HAND  Final   Special Requests   Final    BOTTLES DRAWN AEROBIC ONLY Blood Culture adequate volume   Culture   Final    NO GROWTH 4 DAYS Performed at [facility], [place] [address] [place], [place] [address]    Report Status PENDING  Incomplete  MRSA PCR Screening     Status: None   Collection Time: 09/05/17 10:46 AM  Result Value Ref Range Status   MRSA by PCR NEGATIVE NEGATIVE Final    Comment:        The GeneXpert MRSA Assay  (FDA approved for NASAL specimens only), is one component of a comprehensive MRSA colonization surveillance program. It is not intended to diagnose MRSA infection nor to guide or monitor treatment for MRSA infections. Performed at [facility], [place] [address], [place], [place] [address]   Culture, Urine     Status: None   Collection Time: 09/05/17 11:27 AM  Result Value Ref Range Status   Specimen Description URINE, CATHETERIZED  Final   Special Requests Immunocompromised  Final   Culture   Final    NO GROWTH Performed at [facility], [place] [address], [place], [place] [address]    Report Status 09/06/2017 FINAL  Final  Culture, respiratory (non-expectorated)     Status: None   Collection Time: 09/05/17 11:51 AM  Result Value Ref Range Status   Specimen Description TRACHEAL ASPIRATE  Final   Special Requests NONE  Final   Gram Stain   Final    MODERATE WBC PRESENT,BOTH PMN AND MONONUCLEAR NO ORGANISMS SEEN    Culture   Final    FEW Consistent with normal respiratory flora. Performed at [facility], [place] [address], [place], [place] [address]    Report Status 09/07/2017 FINAL  Final    Lipid Panel Recent Labs    09/05/17 1221  TRIG 120    Studies/Results: Dg Chest Port 1 View  Result Date: 09/05/2017 CLINICAL DATA:  Ventilator support. EXAM: PORTABLE CHEST 1 VIEW COMPARISON:  08/09/2017 FINDINGS: Endotracheal tube tip is 1 cm above the carina. Orogastric or nasogastric tube enters the stomach. The lungs are clear except for mild atelectasis in the left lower lobe. No edema or visible effusion. Atherosclerosis and tortuosity of the aorta. IMPRESSION: Endotracheal tube and orogastric  tube appear well positioned. Mild atelectasis in the left lower lobe. Electronically Signed   By: Nelson Chimes M.D.   On: 09/05/2017 11:46   Dg Abd Portable 1v  Result Date: 09/05/2017 CLINICAL DATA:  Orogastric placement. EXAM: PORTABLE ABDOMEN - 1 VIEW COMPARISON:  None.  FINDINGS: Orogastric tube enters the stomach with its tip in the fundus. Gas pattern is unremarkable. No abnormal calcifications or bone findings. IMPRESSION: Orogastric tube tip in the gastric fundus. Gas pattern unremarkable. Electronically Signed   By: Nelson Chimes M.D.   On: 09/05/2017 11:47    Medications:  Scheduled: . aspirin  81 mg Oral Daily  . atorvastatin  40 mg Oral Daily  . chlorhexidine gluconate (MEDLINE KIT)  15 mL Mouth Rinse BID  . clopidogrel  75 mg Oral Daily  . enoxaparin (LOVENOX) injection  40 mg Subcutaneous Q24H  . feeding supplement (PRO-STAT SUGAR FREE 64)  30 mL Per Tube BID  . feeding supplement (VITAL HIGH PROTEIN)  1,000 mL Per Tube Q24H  . fentaNYL (SUBLIMAZE) injection  50 mcg Intravenous Once  . mouth rinse  15 mL Mouth Rinse 10 times per day  . PHENObarbital  65 mg Intravenous QHS  . phenytoin (DILANTIN) IV  100 mg Intravenous Q8H  . sodium chloride flush  3 mL Intravenous Q12H   Continuous: . sodium chloride Stopped (09/05/17 1400)  . cefTRIAXone (ROCEPHIN)  IV Stopped (09/06/17 1136)  . dextrose 5 % and 0.45 % NaCl with KCl 20 mEq/L 75 mL/hr at 09/07/17 1000  . famotidine (PEPCID) IV Stopped (09/06/17 1550)  . fentaNYL infusion INTRAVENOUS    . lacosamide (VIMPAT) IV Stopped (09/07/17 0948)  . levETIRAcetam Stopped (09/07/17 2409)  . magnesium sulfate 1 - 4 g bolus IVPB 2 g (09/07/17 1020)  . midazolam (VERSED) infusion 20 mg/hr (09/07/17 1000)  . propofol (DIPRIVAN) infusion 30 mcg/kg/min (09/07/17 1000)   LTM EEG report from yesterday: Day 4: Burst suppression pattern was achieved around 12 noon.  Since that time background activities remain marked by burst and suppression pattern with 3-3 ratio.  No seizures noted.  Occasionally within those burst left frontotemporal and independent right frontal and temporal sharp waves and spikes present throughout the recording. Clinical interpretation: This day 4 of intensive EEG monitoring with simultaneous  video monitoring is is marked by achievement of burst suppression pattern as discussed above.  No seizures since that time.  Occasional independent left temporal and right frontotemporal sharp waves and spikes still present suggestive of cortical irritability.    LTM EEG preliminary review (bedside EEG review 8/2): In burst suppression at approximately 1:4 ratio.   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 Overall presentation is most consistent with ongoing seizure activity since Wednesday,7/24 which has been refractory to multiple anticonvulsants 1.Now in burst suppression on Versed and propofol. Continuing phenobarbital, Vimpat,Keppra and Dilantin.  2. Prior history of seizures 3. History of leftMCA stroke  Recommendations: 1.Continue with LTM EEG.  2.ContinueDilantinwith pharmacy to dose. 3. Continuephenobarbital,Vimpat and Keppra 4.Continuous pulse oximetry and cardiac telemetry. 5. Frequent neuro checks. 6. Continue propofol at current rate.  7. Beginning weaning off Versed. Titrate down on the Versed drip according to the following schedule: 0900 17m to 234m 1000 2023mo 40m6m100 40mg28m0mg a32mcall Neurology when off.  40 minutes spent in the neurological evaluation and management of this critically ill patient with status epilepticus. Time included EEG review and family  education.     LOS: 6 days   _0  signed: Dr. Kerney Elbe 09/07/2017  10:21 AM

## 2017-09-07 NOTE — Procedures (Signed)
Electroencephalography report.  Long-term monitoring  Data acquisition: International 10-20 for eligible placement.  18 channels EEG with additional EKG channel  Recording begins 09/06/2017 at 07 30  AM Recording and 08/02 /2019 at 7:30 AM  Number of study day 5  CPT 95951  This intensive EEG monitoring with simultaneous video monitoring to provide this patient with history of stroke and now cognitive and functional decline to rule out clinical and subclinical electrographic seizures  Day 1 : There was one pushbutton activation events however appeared to be an accident.  Background activities marked by disorganized background activity slowing ranging between 5 to 6 cps with me with admixed faster frequencies as well as delta slowing throughout the recording without posterior dominant rhythm.  Superimposed near continues left posterior polyspike and wave discharges present at times was brought negative field and periodic fashion suggestive of left lateralizing periodic  epileptiform discharges.  At times this left posterior polyspike and wave discharges become rhythmic synchronized with evolving features in morphology frequency amplitude and extension of negative field in a right parieto-occipital cortex consistent with electrographic seizures.  During second half of the recording background activities obscured by significant electrode artifact however when there is a time with a relative absence of electrode artifact it appears that patient continue to have electrographic seizures as discussed above.  There was no obvious clinical correlate to this electrographic seizures.  Day 2: Background activities marked by 5 to 6 cps background activities with admixed slower frequencies in the delta range without well-defined posterior dominant rhythm.  Superimposed near continuous left parieto-occipital polyspike and wave discharges present throughout the recording at times negative field extending to the right  parieto-occipital cortex.  In addition frequent electrographic seizures still noted most likely with onset across left parieto-occipital cortex with evolution morphology frequency amplitude and propagation of negative field to the right parieto-occipital region.  There is no obvious clinical accompaniment.  EEG was technically difficult due to  motion artifact  Day 3: Background activities marked by background activity slowing distributed broadly.  Superimposed continues non-remitting 1 cps periodic polyspike and wave discharges with maximum negativity in the left parieto-occipital cortex present throughout the recording.  During second half of the recording this left hemispheric discharges become more blunted and not as well developed suggesting of some degree of improvement however these discharges occur still 1 cps frequency. There is also independent right posterior sharp waves maximum negativity right parieto-occipital cortex present throughout the recording however significantly less developed.  Occasionally electrographic seizures present involving a left parieto-occipital and propagating to the right parietal cortex without obvious clinical accompaniment.  Day 4: Burst suppression pattern was achieved around 12 noon.  Since that time background activities remain marked by burst and suppression pattern with 3-3 ratio.  No seizures noted.  Occasionally within those burst left frontotemporal and independent right frontal and temporal sharp waves and spikes present throughout the recording.  Day 5: Background activities marked by burst suppression pattern throughout the recording ranging between 3-3 and 2-3 ratio.  Left frontotemporal and right frontal sharp waves and spikes occasionally present throughout the recording.  There is no clinical or subclinical seizures.  Clinical interpretation: This day 5 of intensive EEG monitoring with simultaneous video monitoring did not demonstrate any clinical or   subclinical seizures.  Background activities marked by burst suppression pattern throughout the recording.  Independent left and right frontotemporal sharp waves and spikes present suggestive of some degree of cortical irritability.  No clinical or subclinical seizures present.

## 2017-09-07 NOTE — Progress Notes (Signed)
Based on EEG after Versed and Propofol had been off, Dr. Cheral Marker wanted to restart the Versed at 17.5 mg/hr at 1630 then increase to 35 mg/hr at 1730 and continue burst suppression for 48 hours.  Family made aware of the pt's condition.

## 2017-09-07 NOTE — Progress Notes (Signed)
Dr. Cheral Marker instructed to start titrating down on the Versed drip according to the following schedule:  0900 35mg  to 20mg  1000 20mg  to 10mg  1100 10mg  to 0mg  and call him when off.

## 2017-09-07 NOTE — Progress Notes (Signed)
Daily Progress Note   Patient Name: Ebony Williams       Date: 09/07/2017 DOB: 09-29-1950  Age: 67 y.o. MRN#: [id number] Attending Physician: Garner Nash, DO Primary Care Physician: Sharilyn Sites, MD Admit Date: 08/28/2017  Reason for Consultation/Follow-up: Establishing goals of care and Psychosocial/spiritual support  Subjective: Patient seen, chart reviewed.  Patient's husband and daughter Morey Hummingbird are at the bedside.  Patient has been titrated off of Versed this morning.  Family waiting for input from neurology.  Per chart review, EEG reading shows cortical irritability but no clinical subclinical seizure activity.  Patient remains on 4 anticonvulsants; unresponsive  Length of Stay: 6  Current Medications: Scheduled Meds:  . aspirin  81 mg Oral Daily  . atorvastatin  40 mg Oral Daily  . chlorhexidine gluconate (MEDLINE KIT)  15 mL Mouth Rinse BID  . clopidogrel  75 mg Oral Daily  . enoxaparin (LOVENOX) injection  40 mg Subcutaneous Q24H  . feeding supplement (PRO-STAT SUGAR FREE 64)  30 mL Per Tube BID  . feeding supplement (VITAL HIGH PROTEIN)  1,000 mL Per Tube Q24H  . fentaNYL (SUBLIMAZE) injection  50 mcg Intravenous Once  . mouth rinse  15 mL Mouth Rinse 10 times per day  . PHENObarbital  65 mg Intravenous QHS  . phenytoin (DILANTIN) IV  100 mg Intravenous Q8H  . sodium chloride flush  3 mL Intravenous Q12H    Continuous Infusions: . sodium chloride Stopped (09/05/17 1400)  . cefTRIAXone (ROCEPHIN)  IV Stopped (09/07/17 1219)  . dextrose 5 % and 0.45 % NaCl with KCl 20 mEq/L 75 mL/hr at 09/07/17 1300  . famotidine (PEPCID) IV Stopped (09/06/17 1550)  . fentaNYL infusion INTRAVENOUS    . lacosamide (VIMPAT) IV Stopped (09/07/17 0948)  . levETIRAcetam Stopped (09/07/17  4081)  . midazolam (VERSED) infusion Stopped (09/07/17 1102)  . propofol (DIPRIVAN) infusion 30 mcg/kg/min (09/07/17 1300)    PRN Meds: bisacodyl, fentaNYL, hydrALAZINE, nystatin  Physical Exam  Constitutional:  Acutely ill-appearing elderly female seen in ICU.  She is intubated, unresponsive  HENT:  Head: Normocephalic and atraumatic.  Cardiovascular: Normal rate.  Pulmonary/Chest:  Intubated  Genitourinary:  Genitourinary Comments: Foley  Skin: Skin is warm and dry.  Psychiatric:  No overt agitation otherwise unable to test  Nursing note and vitals reviewed.  Vital Signs: BP (!) 181/78   Pulse (!) 104   Temp 98.9 F (37.2 C) (Oral)   Resp (!) 22   Ht _0  (1.575 m)   Wt 64.8 kg (142 lb 13.7 oz)   SpO2 100%   BMI 26.13 kg/m  SpO2: SpO2: 100 % O2 Device: O2 Device: Ventilator O2 Flow Rate:    Intake/output summary:   Intake/Output Summary (Last 24 hours) at 09/07/2017 1343 Last data filed at 09/07/2017 1300 Gross per 24 hour  Intake 4168.31 ml  Output 1065 ml  Net 3103.31 ml   LBM: Last BM Date: 09/05/17 Baseline Weight: Weight: 67.6 kg (149 lb) Most recent weight: Weight: 64.8 kg (142 lb 13.7 oz)       Palliative Assessment/Data:    Flowsheet Rows     Most Recent Value  Intake Tab  Referral Department  -- [ED]  Unit at Time of Referral  ER  Palliative Care Primary Diagnosis  Neurology  Date Notified  08/08/2017  Palliative Care Type  Return patient Palliative Care  Reason for referral  Clarify Goals of Care  Date of Admission  08/28/2017  Date first seen by Palliative Care  09/03/17  # of days Palliative referral response time  2 Day(s)  # of days IP prior to Palliative referral  0  Clinical Assessment  Psychosocial & Spiritual Assessment  Palliative Care Outcomes      Patient Active Problem List   Diagnosis Date Noted  . Metabolic encephalopathy 80/99/8338  . UTI due to Klebsiella species 08/07/2017  . Seizure, late effect of stroke  ([place]) 08/19/2017  . 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 Malnutrition of moderate degree 08/06/2017  . DNR (do not resuscitate) discussion   . Seizure ([place])   . Palliative care by specialist   . Goals of care, counseling/discussion   . Altered mental status 08/01/2017  . Right arm weakness 06/16/2017  . Aphasia 06/16/2017  . Spastic hemiplegia of right dominant side as late effect of cerebral infarction ([place])   . Hyponatremia   . Labile blood pressure   . Hypokalemia   . Elevated BUN   . Lethargy   . Transaminitis   . Essential hypertension   . Hypertensive crisis   . 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 Depression   . Dysphagia, post-stroke   . Diastolic dysfunction   . Benign essential HTN   . Prediabetes   . Leukocytosis   . Cerebral infarction due to occlusion of left middle cerebral artery ([place]) s/p IV tPA 04/06/2017  . Stroke (cerebrum) ([place]) 04/06/2017  . 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 Pain in joint, shoulder region 10/18/2011    Palliative Care Assessment & Plan   Patient Profile: This 67 year old female past medical history of large MCA stroke in March 2019.  Since then per the family she has had significant difficulty in her recovery.  At her best she is wheelchair-bound with significant right-sided hemiparesis.  She was admitted to the hospital on 08/31/2017.  Critical care was paged by neurology this morning for ongoing status epilepticus.  Since admission she is currently received phenobarbital, Vimpat, Keppra as well as Dilantin.  After discussion with neurology as well as the hospitalist the recommendation was for admission to the intensive care unit, intubation and initiation of burst suppression through sedation.    Consult ordered for goals of care.  As of 09/07/2017, patient remains intubated.  She is now off Versed but remains on 4 anticonvulsants and minimally  responsive.  Per palliative medicine provider, Fenton Malling, NP, patient's family  shares that she would not want to remain in this debilitated state where she unable able to communicate or improve   Recommendations/Plan:  Continue with fentanyl as prescribed by CCM for pain as well as shortness of breath  Palliative medicine to continue to stay involved with family with ongoing conversations regarding goals of care.  Patient is now a partial code with no CODE BLUE, no CPR, yes to intubation (patient is currently ventilated and at this point no plans to liberate from vent), yes to BiPAP, no pressors or cardioversion.  If patient shows no improvement in mentation, based on previous goals of care discussion with family and palliative medicine provider, family may move towards full comfort care, liberation from vent    Code Status:    Code Status Orders  (From admission, onward)        Start     Ordered   09/05/17 1204  Limited resuscitation (code)  Continuous    Question Answer Comment  In the event of cardiac or respiratory ARREST: Initiate Code Blue, Call Rapid Response No   In the event of cardiac or respiratory ARREST: Perform CPR No   In the event of cardiac or respiratory ARREST: Perform Intubation/Mechanical Ventilation Yes   In the event of cardiac or respiratory ARREST: Use NIPPV/BiPAp only if indicated Yes   In the event of cardiac or respiratory ARREST: Administer ACLS medications if indicated No   In the event of cardiac or respiratory ARREST: Perform Defibrillation or Cardioversion if indicated No   Comments Already intubated      09/05/17 1204    Code Status History    Date Active Date Inactive Code Status Order ID Comments User Context   08/09/2017 1711 09/03/2017 1311 DNR [id number]  Lady Deutscher, MD ED   08/01/2017 2332 08/07/2017 1911 Partial Code [id number]  Oswald Hillock, MD Inpatient   06/16/2017 1622 06/22/2017 1755 Full Code [id number]  Rondel Jumbo, PA-C ED   04/11/2017 1640 05/08/2017 1849 Full Code [id number]  Cathlyn Parsons, PA-C  Inpatient   04/11/2017 1640 04/11/2017 1640 Full Code [id number]  Cathlyn Parsons, PA-C Inpatient   04/06/2017 2220 04/11/2017 1634 Full Code [id number]  Kerney Elbe, MD Inpatient    Advance Directive Documentation     Most Recent Value  Type of Advance Directive  Out of facility DNR (pink MOST or yellow form)  Pre-existing out of facility DNR order (yellow form or pink MOST form)  -  "MOST" Form in Place?  -       Prognosis:   Unable to determine  Discharge Planning:  To Be Determined  Care plan was discussed with bedside RN  Thank you for allowing the Palliative Medicine Team to assist in the care of this patient.   Time In: 1100 Time Out: 1135 Total Time 35 min Prolonged Time Billed  no       Greater than 50%  of this time was spent counseling and coordinating care related to the above assessment and plan.  Dory Horn, NP  Please contact Palliative Medicine Team phone at [phone] for questions and concerns.

## 2017-09-07 NOTE — Progress Notes (Addendum)
PULMONARY / CRITICAL CARE MEDICINE   Name: Ebony Williams MRN: [id number] DOB: November 26, 1950    ADMISSION DATE:  08/08/2017 CONSULTATION DATE:  8/1  REFERRING MD:  Cheral Marker  CHIEF COMPLAINT:  Seizure critical care asked to see for airway support   HISTORY OF PRESENT ILLNESS:   This is a 67 year old white female who recently suffered a large MCA distribution stroke in March 2019 and has been quite debilitated since that time.  Consequently this is resulted in seizures.  Additional medical concerns: Recent UTI, hypertension, immobility.  On her best day post stroke she was wheelchair-bound, still having right hemiparesis with spasticity.  She was verbal,, fully alert, oriented to person place and time but had some moderate expressive aphasia.  More recently she was just discharged from the hospital to skilled nursing facility following urinary tract infection. On 7/27 with altered mental status. She was  Also unable  to take her oral medications.  She was admitted with a concern for subclinical seizure, CT and MRI imaging were obtained, as were neurological consult.  An EEG was obtained demonstrating left hemisphere seizure, she was admitted for titration of her anti-epileptic medications.  On 729 continuous EEG was initiated she continued to have semi-process of whole movement to the left upper and left lower extremity but remained in a coma.  EEG evaluation showed subclinical seizure activity, AEDs were once again titrated.  As of 7/31 she had since had the addition of phenobarbital, on top of Vimpat and Keppra as well as Dilantin.  Based on ongoing status it was decided she should be transferred to the intensive care for burst suppression protocol.  Critical care was asked to evaluate and assist with airway management.  Subjective: No significant events noted overnight,  - Stable last 24 with burst supression - Currently on Versed 20 mg. Weaning from 35 mg with goal off off today at 11 am and monitor  for further seizures   STUDIES:  CT head 7/27: No evidence of acute intracranial abnormality.  Atrophy with chronic small vessel white matter ischemic changes and remote left-sided infarct. 7/31 EEG: Day 3 of intensive EEG monitoring.  Still near continuous left parietal occipital polyspike epileptiform changes 09/06/2017>> Burst suppression achieved 09/07/2017>> Starting to wean versed gtt  CULTURES: Sputum culture 7/31 Urine culture 7/31>> No growth Blood culture 7/28  ANTIBIOTICS: Rocephin 09/02/2017>>  LINES/TUBES: Oral endotracheal tube 7/31  VITAL SIGNS: BP (!) 153/66   Pulse 74   Temp 97.9 F (36.6 C) (Oral)   Resp 19   Ht 5\' 2"  (1.575 m)   Wt 142 lb 13.7 oz (64.8 kg)   SpO2 100%   BMI 26.13 kg/m   VENTILATOR SETTINGS: Vent Mode: PRVC FiO2 (%):  [30 %] 30 % Set Rate:  [18 bmp] 18 bmp Vt Set:  [400 mL] 400 mL PEEP:  [5 cmH20] 5 cmH20 Plateau Pressure:  [13 cmH20-14 cmH20] 14 cmH20  INTAKE / OUTPUT:  Intake/Output Summary (Last 24 hours) at 09/07/2017 0849 Last data filed at 09/07/2017 0800 Gross per 24 hour  Intake 4064.96 ml  Output 875 ml  Net 3189.96 ml     PHYSICAL EXAMINATION: General:  Chronically ill appearing white female. Currently sedated and intubated on the vent Neuro:  Unresponsive. Sedated for burst suppression, Non-verbal. Moves left side spontaneously   HEENT:  NCAT right sided facial droop.  Cardiovascular:  RRR no MRG Lungs: Bilateral chest excursion, clear throughout, diminished per bases Abdomen:  Soft not tender, ND,  + bowel sounds  Musculoskeletal:  Right sided weakness, otherwise normal bulk, no obvious deformities Skin:  Warm and dry , No rash or lesions noted  LABS:  BMET Recent Labs  Lab 09/05/17 0455 09/06/17 0252 09/07/17 0317  NA 139 140 140  K 3.7 3.7 4.0  CL 107 109 111  CO2 24 24 21*  BUN 10 11 12   CREATININE 0.50 0.56 0.46  GLUCOSE 147* 107* 137*    Electrolytes Recent Labs  Lab 09/03/17 0710   09/05/17 0455 09/06/17 0252 09/07/17 0317  CALCIUM 9.0   < > 9.4 9.1 8.4*  MG 1.6*  --  2.0  --  1.6*  PHOS  --   --   --   --  4.1   < > = values in this interval not displayed.    CBC Recent Labs  Lab 09/05/17 0455 09/06/17 0252 09/07/17 0317  WBC 13.3* 10.9* 8.9  HGB 15.5* 14.1 13.1  HCT 45.7 43.3 41.0  PLT 234 184 147*    Coag's Recent Labs  Lab 09/07/17 0317  INR 1.15    Sepsis Markers Recent Labs  Lab 08/11/2017 1254 08/21/2017 1436 09/05/17 1221  LATICACIDVEN 1.23 1.07  --   PROCALCITON  --   --  0.18    ABG Recent Labs  Lab 09/05/17 1248 09/07/17 0418  PHART 7.325* 7.338*  PCO2ART 46.2 41.5  PO2ART 156.0* 131*    Liver Enzymes Recent Labs  Lab 08/08/2017 1419 09/07/17 0317  AST 25 18  ALT 19 12  ALKPHOS 52 61  BILITOT 1.2 0.3  ALBUMIN 3.6 2.5*    Cardiac Enzymes No results for input(s): TROPONINI, PROBNP in the last 168 hours.  Glucose Recent Labs  Lab 09/06/17 0335 09/06/17 0818 09/06/17 1236 09/06/17 1732 09/06/17 2012 09/06/17 2358  GLUCAP 89 122* 104* 112* 135* 125*    Imaging No results found. DISCUSSION: 67 year old female with prior MCA stroke, now presents with status epilepticus.  Critical care asked to support airway for burst suppression therapy.  Long discussion with family including daughter and son.  Plan will be for about 48 hours of aggressive care in hopes to stop seizure activity.  Family does not want prolonged aggressive therapy particularly given the overall poor prognosis.  ASSESSMENT / PLAN:  Status epilepticus.  Now status post 72 hours without cessation of seizure activity as evidenced by EEG. Plan Continue antiepileptic drugs per neurology: Currently Dilantin, phenobarbital, Vimpat and Keppra Burst suppression protocol- Now decreasing versed with goal of DC by 11 am today Continuous EEG Supportive care  History of left MCA stroke with right-sided hemiparesis Plan Supportive care Resume aspirin and  Plavix and atorvastatin Will add additional BP control as needed with decrease in sedation  Acute respiratory failure in the setting of ineffective airway clearance Intubated for airway protection with burst suppression on 09/05/2017 CO2 on ABG is 41.5 this am, suspect as we decrease versed she will start to assist vent and this will correct.  Plan Full ventilator support ABG and chest x-ray prn Aspiration precaution -Add bronchodilators -No plan for SBT until  ~suppression is discontinued  and patient seizures are improved>> possibly 8/3  Vomiting Plan Tolerating TF No further vomiting noted -Abdominal x-ray was okay - Continue TF -  Aspiration precautions as above  Possible aspiration event: Patient vomited on arrival to the intensive care No debris noted on vocal cords during intubation- Remains afebrile>> WBC 8.9 on 8/2 Plan Trend fever and WBC chest x-ray 8/3 Follow Sputum culture for results Currently  on Rocephin for UTI, monitor if show signs of worsening fever or sepsis switch Rocephin to Zosyn -Repeat x-ray tomorrow morning   Hypertension Plan On Versed in burst suppression protocol. Will place PRN hydralazine for systolic blood pressure greater than 180 Will add home norvasc if prn apresoline cannot control SBP at 180 as we discontinue Versed  Mild leukocytosis -No fever.  Significance unclear-improvement noted Plan Trend fever and white blood cell curve-cultures negative so far Reculturing urine given recent UTI>> No growth Follow Sputum culture as above   Inadequate caloric intake Tolerating TF Plan Initiate  tube feeds 8/1    Hyperglycemia Plan CBG's Q 4 Sliding scale insulin   Skin/Wound: chronic changes  Electrolytes: Replace electrolytes per ICU electrolyte replacement protocol.   IVF: NS at 16  Nutrition: Tube feeds  Prophylaxis: DVT Prophylaxis with lovenox,. GI Prophylaxis.   Restraints: soft limb  PT/OT eval and treat. OOB when  appropriate.   Lines/Tubes:  7/31 foley  no central line.  ADVANCE DIRECTIVE:Partial code no CPR ok with intubation  FAMILY DISCUSSION:spoke with daughter  Quality Care: PPI, DVT prophylaxis, HOB elevated, Infection control all reviewed and addressed.  Events and notes from last 24 hours reviewed. Care plan discussed on multidisciplinary rounds       APP CC time 30 minutes  Magdalen Spatz, AGACNP-BC [place] Pager # [phone] 09/07/2017, 8:49 AM

## 2017-09-07 NOTE — Progress Notes (Signed)
[place] for phenytoin Indication: seizures  Allergies  Allergen Reactions  . Sulfa Antibiotics Diarrhea and Itching    Patient Measurements: Height: 5\' 2"  (157.5 cm) Weight: 142 lb 13.7 oz (64.8 kg) IBW/kg (Calculated) : 50.1 Adjusted Body Weight: 58 kg  Vital Signs: Temp: 98.9 F (37.2 C) (08/02 1210) Temp Source: Oral (08/02 1210) BP: 143/64 (08/02 1500) Pulse Rate: 112 (08/02 1500) Intake/Output from previous day: 08/01 0701 - 08/02 0700 In: 3911.6 [I.V.:2584.1; NG/GT:887.5; IV Piggyback:440] Out: 925 [Urine:925] Intake/Output from this shift: Total I/O In: 1378.2 [I.V.:583.4; NG/GT:450; IV Piggyback:344.7] Out: 350 [Urine:350]  Labs: Recent Labs    09/05/17 0455 09/06/17 0252 09/07/17 0317  WBC 13.3* 10.9* 8.9  HGB 15.5* 14.1 13.1  HCT 45.7 43.3 41.0  PLT 234 184 147*  CREATININE 0.50 0.56 0.46  MG 2.0  --  1.6*  PHOS  --   --  4.1  ALBUMIN  --   --  2.5*  PROT  --   --  4.9*  AST  --   --  18  ALT  --   --  12  ALKPHOS  --   --  61  BILITOT  --   --  0.3   Estimated Creatinine Clearance: 58.7 mL/min (by C-G formula based on SCr of 0.46 mg/dL).   Medical History: Past Medical History:  Diagnosis Date  . History of kidney stones    x1  . Hypertension     Medications:  Medications Prior to Admission  Medication Sig Dispense Refill Last Dose  . acetaminophen (TYLENOL) 325 MG tablet Take 2 tablets (650 mg total) by mouth every 6 (six) hours as needed for mild pain, fever or headache. (Patient taking differently: Take 650 mg by mouth every 6 (six) hours as needed for fever (general discomfort). )   unknown  . ALPRAZolam (XANAX) 0.5 MG tablet Take 1 tablet (0.5 mg total) by mouth 3 (three) times daily as needed for anxiety or sleep. (Patient taking differently: Take 0.5 mg by mouth every 8 (eight) hours as needed for anxiety or sleep. ) 30 tablet 0 08/26/2017 at 1324  . amantadine (SYMMETREL) 100 MG capsule Take  2 capsules (200 mg total) by mouth 2 (two) times daily. 10 capsule 0 08/30/2017 at 1700  . amLODipine (NORVASC) 10 MG tablet Take 1 tablet (10 mg total) by mouth daily.   08/30/2017 at 900  . aspirin 81 MG chewable tablet Chew 1 tablet (81 mg total) by mouth daily.   08/30/2017 at 900  . atorvastatin (LIPITOR) 40 MG tablet Take 1 tablet (40 mg total) by mouth daily.   08/30/2017 at 900  . cephALEXin (KEFLEX) 500 MG capsule Take 500 mg by mouth every 12 (twelve) hours. 7 day course (for UTI) started 08/28/17 pm   08/30/2017 at 2100  . clopidogrel (PLAVIX) 75 MG tablet Take 1 tablet (75 mg total) by mouth daily.   08/30/2017 at 900  . FLUoxetine (PROZAC) 40 MG capsule Take 1 capsule (40 mg total) by mouth at bedtime.  3 08/30/2017 at 2100  . levETIRAcetam (KEPPRA) 100 MG/ML solution Take 1,500 mg by mouth 2 (two) times daily.   08/30/2017 at 1700  . lisinopril (PRINIVIL,ZESTRIL) 5 MG tablet Take 5 mg by mouth daily.   08/30/2017 at 900  . metFORMIN (GLUCOPHAGE) 500 MG tablet Take 1 tablet (500 mg total) by mouth daily with breakfast.   08/30/2017 at 900  . nystatin (NYSTATIN) powder Apply topically See  admin instructions. Apply topically to groin every 12 hours as needed for rash/irritation   unknown  . ondansetron (ZOFRAN) 4 MG tablet Take 4 mg by mouth every 8 (eight) hours as needed for nausea or vomiting.   08/30/2017 at 2024  . pantoprazole (PROTONIX) 40 MG tablet Take 1 tablet (40 mg total) by mouth daily.   08/30/2017 at 900  . polyethylene glycol (MIRALAX / GLYCOLAX) packet Take 17 g by mouth daily. Mix with 8-10 oz water and drink   08/30/2017 at 900  . potassium chloride SA (K-DUR,KLOR-CON) 20 MEQ tablet Take 20 mEq by mouth daily.   08/30/2017 at 900  . PRESCRIPTION MEDICATION See admin instructions. Administer normal saline 1000 ml @@ 25 ml/hr via clysis every shift (day, evening, night)   08/31/2017 at night  . traMADol (ULTRAM) 50 MG tablet Take 1 tablet (50 mg total) by mouth every 8 (eight) hours as  needed for severe pain. (Patient taking differently: Take 50 mg by mouth See admin instructions. Take one tablet (50 mg) by mouth every 12 hours, may also take one tablet every 8 hours as needed for severe pain) 15 tablet 0 08/30/2017 at 2100  . feeding supplement, GLUCERNA SHAKE, (GLUCERNA SHAKE) LIQD Take 237 mLs by mouth 3 (three) times daily between meals. (Patient not taking: Reported on 08/23/2017)   Not Taking at Unknown time  . levETIRAcetam (KEPPRA) 750 MG tablet Take 2 tablets (1,500 mg total) by mouth 2 (two) times daily. (Patient not taking: Reported on 08/31/2017)   Not Taking at Unknown time    Assessment: 67 y/o female with large MCA distribution stroke in 04/2017 and resultant seizures admitted 08/07/2017 with AMS. Patient was started on multiple AEDs with refractory seizures. Transferred to ICU on 09/05/17 for intubation and burst suppression with propofol and midazolam.   PTA regimen: Keppra 1500 mg PO bid  Current AED regimen: Keppra 1500 mg IV q12h from PTA Vimpat 200 mg IV q12h Phenytoin 100 mg IV Q8h Phenobarbital 65 mg IV QHS  Midazolam 25 mg/hr  Propofol 30 mcg/kg/min   7/28 ~24h post-load phenytoin level 22.2 (albumin 3.6 and normal) and just above goal 8/2 - An 8 hour phenytoin trough is 15.4 (corrects to 25.7)  Of note: both phenobarbital and phenytoin are strong CYP3A4 inducers and can enhance clearance of midazolam which is a CYP3A4 substrate.   Goal of Therapy:  Total level 10-20 mcg/ml Free level 1-2 mcg/ml  Plan:  - Continue phenytoin 100 mg IV q8h (~5 mg/kg/day based on ABW) for now. Based off [place] discussion today, will consider decreasing night time phenytoin dose to 50 mg - Monitor renal function and albumin level   Albertina Parr, PharmD., BCPS Clinical Pharmacist Clinical phone for 09/06/17 until 3:30pm: [phone] If after 3:30pm, please refer to [facility] for unit-specific pharmacist

## 2017-09-08 ENCOUNTER — Inpatient Hospital Stay (HOSPITAL_COMMUNITY): Payer: Medicare Other

## 2017-09-08 LAB — BLOOD GAS, ARTERIAL
Acid-base deficit: 0.9 mmol/L (ref 0.0–2.0)
BICARBONATE: 24 mmol/L (ref 20.0–28.0)
Drawn by: 511911
FIO2: 0.3
LHR: 18 {breaths}/min
O2 Saturation: 98.1 %
PATIENT TEMPERATURE: 98.6
PCO2 ART: 44.8 mmHg (ref 32.0–48.0)
PEEP: 5 cmH2O
VT: 400 mL
pH, Arterial: 7.348 — ABNORMAL LOW (ref 7.350–7.450)
pO2, Arterial: 115 mmHg — ABNORMAL HIGH (ref 83.0–108.0)

## 2017-09-08 LAB — CBC WITH DIFFERENTIAL/PLATELET
Abs Immature Granulocytes: 0 10*3/uL (ref 0.0–0.1)
Basophils Absolute: 0 10*3/uL (ref 0.0–0.1)
Basophils Relative: 0 %
Eosinophils Absolute: 0.1 10*3/uL (ref 0.0–0.7)
Eosinophils Relative: 1 %
HEMATOCRIT: 40.1 % (ref 36.0–46.0)
HEMOGLOBIN: 13.2 g/dL (ref 12.0–15.0)
Immature Granulocytes: 0 %
LYMPHS ABS: 2 10*3/uL (ref 0.7–4.0)
LYMPHS PCT: 23 %
MCH: 31.6 pg (ref 26.0–34.0)
MCHC: 32.9 g/dL (ref 30.0–36.0)
MCV: 95.9 fL (ref 78.0–100.0)
Monocytes Absolute: 0.7 10*3/uL (ref 0.1–1.0)
Monocytes Relative: 8 %
NEUTROS PCT: 68 %
Neutro Abs: 5.8 10*3/uL (ref 1.7–7.7)
Platelets: 143 10*3/uL — ABNORMAL LOW (ref 150–400)
RBC: 4.18 MIL/uL (ref 3.87–5.11)
RDW: 13.5 % (ref 11.5–15.5)
WBC: 8.7 10*3/uL (ref 4.0–10.5)

## 2017-09-08 LAB — BASIC METABOLIC PANEL
Anion gap: 5 (ref 5–15)
BUN: 12 mg/dL (ref 8–23)
CALCIUM: 8 mg/dL — AB (ref 8.9–10.3)
CO2: 25 mmol/L (ref 22–32)
Chloride: 109 mmol/L (ref 98–111)
Creatinine, Ser: 0.4 mg/dL — ABNORMAL LOW (ref 0.44–1.00)
GFR calc non Af Amer: >60 mL/min (ref >60)
Glucose, Bld: 152 mg/dL — ABNORMAL HIGH (ref 70–99)
Potassium: 3.6 mmol/L (ref 3.5–5.1)
SODIUM: 139 mmol/L (ref 135–145)

## 2017-09-08 LAB — GLUCOSE, CAPILLARY
GLUCOSE-CAPILLARY: 141 mg/dL — AB (ref 70–99)
GLUCOSE-CAPILLARY: 115 mg/dL — AB (ref 70–99)
Glucose-Capillary: 134 mg/dL — ABNORMAL HIGH (ref 70–99)
Glucose-Capillary: 139 mg/dL — ABNORMAL HIGH (ref 70–99)
Glucose-Capillary: 149 mg/dL — ABNORMAL HIGH (ref 70–99)

## 2017-09-08 LAB — MAGNESIUM: MAGNESIUM: 1.8 mg/dL (ref 1.7–2.4)

## 2017-09-08 LAB — TRIGLYCERIDES: Triglycerides: 112 mg/dL (ref <150)

## 2017-09-08 MED ORDER — PHENYTOIN SODIUM 50 MG/ML IJ SOLN
50.0000 mg | Freq: Every day | INTRAMUSCULAR | Status: DC
  Administered 2017-09-09 – 2017-09-10 (×2): 50 mg via INTRAVENOUS
  Filled 2017-09-08 (×2): qty 2

## 2017-09-08 MED ORDER — PHENYTOIN SODIUM 50 MG/ML IJ SOLN
100.0000 mg | Freq: Two times a day (BID) | INTRAMUSCULAR | Status: DC
  Administered 2017-09-09 – 2017-09-11 (×5): 100 mg via INTRAVENOUS
  Filled 2017-09-08 (×5): qty 2

## 2017-09-08 NOTE — Progress Notes (Signed)
EEG maint complete. Continue to monitor.  

## 2017-09-08 NOTE — Progress Notes (Signed)
Slightly supratherapeutic level, corrects to 25. Will adjust phenytoin to be 100 mg qAM and qHS and 50 mg qLunch. Will re-check level pending [place].

## 2017-09-08 NOTE — Progress Notes (Signed)
PULMONARY / CRITICAL CARE MEDICINE   Name: RAELEA GOSSE MRN: [id number] DOB: 1950-08-27    ADMISSION DATE:  08/25/2017 CONSULTATION DATE:  09/06/2017  REFERRING MD:  Dr. 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  Consequently this is resulted in seizures.  Additional medical concerns: Recent UTI, hypertension, immobility.  On her best day post stroke she was wheelchair-bound, still having right hemiparesis with spasticity.  She was verbal,, fully alert, oriented to person place and time but had some moderate expressive aphasia.  More recently she was just discharged from the hospital to skilled nursing facility following urinary tract infection. On 7/27 with altered mental status. She was  Also unable  to take her oral medications.  She was admitted with a concern for subclinical seizure, CT and MRI imaging were obtained, as were neurological consult.  An EEG was obtained demonstrating left hemisphere seizure, she was admitted for titration of her anti-epileptic medications.  On 729 continuous EEG was initiated she continued to have semi-process of whole movement to the left upper and left lower extremity but remained in a coma.  EEG evaluation showed subclinical seizure activity, AEDs were once again titrated.  As of 7/31 she had since had the addition of phenobarbital, on top of Vimpat and Keppra as well as Dilantin.  Based on ongoing status it was decided she should be transferred to the intensive care for burst suppression protocol.  Critical care was asked to evaluate and assist with airway management.  PAST MEDICAL HISTORY :  She  has a past medical history of History of kidney stones and Hypertension.  PAST SURGICAL HISTORY: She  has a past surgical history that includes  Cholecystectomy; Tubal ligation; and Colonoscopy with propofol (N/A, 01/12/2015).  Allergies  Allergen Reactions  . Sulfa Antibiotics Diarrhea and Itching    No current facility-administered medications on file prior to encounter.    Current Outpatient Medications on File Prior to Encounter  Medication Sig  . acetaminophen (TYLENOL) 325 MG tablet Take 2 tablets (650 mg total) by mouth every 6 (six) hours as needed for mild pain, fever or headache. (Patient taking differently: Take 650 mg by mouth every 6 (six) hours as needed for fever (general discomfort). )  . ALPRAZolam (XANAX) 0.5 MG tablet Take 1 tablet (0.5 mg total) by mouth 3 (three) times daily as needed for anxiety or sleep. (Patient taking differently: Take 0.5 mg by mouth every 8 (eight) hours as needed for anxiety or sleep. )  . amantadine (SYMMETREL) 100 MG capsule Take 2 capsules (200 mg total) by mouth 2 (two) times daily.  [name] amLODipine (NORVASC) 10 MG tablet Take 1 tablet (10 mg total) by mouth daily.  [name] aspirin 81 MG chewable tablet Chew 1 tablet (81 mg total) by mouth daily.  [name] atorvastatin (LIPITOR) 40 MG tablet Take 1 tablet (40 mg total) by mouth daily.  . cephALEXin (KEFLEX) 500 MG capsule Take 500 mg by mouth every 12 (twelve) hours. 7 day course (for UTI) started 08/28/17 pm  . clopidogrel (PLAVIX) 75 MG tablet Take 1 tablet (75 mg total) by mouth daily.  [name] FLUoxetine (PROZAC) 40 MG capsule Take 1 capsule (40 mg total) by mouth at bedtime.  . levETIRAcetam (KEPPRA) 100 MG/ML solution Take 1,500 mg by mouth 2 (two) times  daily.  . lisinopril (PRINIVIL,ZESTRIL) 5 MG tablet Take 5 mg by mouth daily.  . metFORMIN (GLUCOPHAGE) 500 MG tablet Take 1 tablet (500 mg total) by mouth daily with breakfast.  . nystatin (NYSTATIN) powder Apply topically See admin instructions. Apply topically to groin every 12 hours as needed for rash/irritation  . ondansetron (ZOFRAN) 4 MG tablet Take 4 mg by mouth every 8 (eight) hours as needed  for nausea or vomiting.  . pantoprazole (PROTONIX) 40 MG tablet Take 1 tablet (40 mg total) by mouth daily.  . polyethylene glycol (MIRALAX / GLYCOLAX) packet Take 17 g by mouth daily. Mix with 8-10 oz water and drink  . potassium chloride SA (K-DUR,KLOR-CON) 20 MEQ tablet Take 20 mEq by mouth daily.  [name] PRESCRIPTION MEDICATION See admin instructions. Administer normal saline 1000 ml @@ 25 ml/hr via clysis every shift (day, evening, night)  . traMADol (ULTRAM) 50 MG tablet Take 1 tablet (50 mg total) by mouth every 8 (eight) hours as needed for severe pain. (Patient taking differently: Take 50 mg by mouth See admin instructions. Take one tablet (50 mg) by mouth every 12 hours, may also take one tablet every 8 hours as needed for severe pain)  . feeding supplement, GLUCERNA SHAKE, (GLUCERNA SHAKE) LIQD Take 237 mLs by mouth 3 (three) times daily between meals. (Patient not taking: Reported on 08/29/2017)  . levETIRAcetam (KEPPRA) 750 MG tablet Take 2 tablets (1,500 mg total) by mouth 2 (two) times daily. (Patient not taking: Reported on 08/13/2017)    FAMILY HISTORY:  Her family history includes Diabetes in her mother; Hypertension in her mother.  SOCIAL HISTORY: She  reports that she has never smoked. She has never used smokeless tobacco. She reports that she drinks alcohol. She reports that she does not use drugs.  SUBJECTIVE:  Sedated  VITAL SIGNS: BP (!) 149/73   Pulse 85   Temp 98.3 F (36.8 C) (Axillary)   Resp 17   Ht 5\' 2"  (1.575 m)   Wt 64.8 kg (142 lb 13.7 oz)   SpO2 100%   BMI 26.13 kg/m   HEMODYNAMICS:    VENTILATOR SETTINGS: Vent Mode: PRVC FiO2 (%):  [30 %] 30 % Set Rate:  [18 bmp] 18 bmp Vt Set:  [400 mL] 400 mL PEEP:  [5 cmH20] 5 cmH20 Plateau Pressure:  [9 cmH20-11 cmH20] 10 cmH20  INTAKE / OUTPUT: I/O last 3 completed shifts: In: 6133.8 [I.V.:3699.1; NG/GT:1800; IV Piggyback:634.7] Out: 1725 [Urine:1725]  PHYSICAL EXAMINATION: General:  WD/WN WF  sedated Neuro:  Unresponsive; sedated for burst suppression protocol HEENT:  [place]/AT; PRRL; ETT/FT in place Cardiovascular:  RRR, no m/r/g Lungs:  Scattered rhonchi Abdomen:  Supple, no guarding +BS Musculoskeletal:  No active joints Skin:  No C, C, E  LABS:  BMET Recent Labs  Lab 09/06/17 0252 09/07/17 0317 09/08/17 0246  NA 140 140 139  K 3.7 4.0 3.6  CL 109 111 109  CO2 24 21* 25  BUN 11 12 12   CREATININE 0.56 0.46 0.40*  GLUCOSE 107* 137* 152*    Electrolytes Recent Labs  Lab 09/05/17 0455 09/06/17 0252 09/07/17 0317 09/08/17 0246  CALCIUM 9.4 9.1 8.4* 8.0*  MG 2.0  --  1.6* 1.8  PHOS  --   --  4.1  --     CBC Recent Labs  Lab 09/06/17 0252 09/07/17 0317 09/08/17 0246  WBC 10.9* 8.9 8.7  HGB 14.1 13.1 13.2  HCT 43.3 41.0 40.1  PLT 184 147* 143*  Coag's Recent Labs  Lab 09/07/17 0317  INR 1.15    Sepsis Markers Recent Labs  Lab 08/30/2017 1436 09/05/17 1221  LATICACIDVEN 1.07  --   PROCALCITON  --  0.18    ABG Recent Labs  Lab 09/05/17 1248 09/07/17 0418 09/08/17 0349  PHART 7.325* 7.338* 7.348*  PCO2ART 46.2 41.5 44.8  PO2ART 156.0* 131* 115*    Liver Enzymes Recent Labs  Lab 09/07/17 0317  AST 18  ALT 12  ALKPHOS 61  BILITOT 0.3  ALBUMIN 2.5*    Cardiac Enzymes No results for input(s): TROPONINI, PROBNP in the last 168 hours.  Glucose Recent Labs  Lab 09/06/17 1732 09/06/17 2012 09/06/17 2358 09/08/17 0350 09/08/17 0843 09/08/17 1224  GLUCAP 112* 135* 125* 134* 141* 115*    Imaging Dg Chest Port 1 View  Result Date: 09/08/2017 CLINICAL DATA:  Aspiration pneumonia EXAM: PORTABLE CHEST 1 VIEW COMPARISON:  09/05/2017 FINDINGS: Endotracheal tube and nasogastric catheter are well seen and stable. Cardiac shadow is within normal limits. Mild left retrocardiac density is noted slightly increased from the prior exam. No other focal infiltrate is noted. No bony abnormality is noted. IMPRESSION: Slight increase in left  basilar retrocardiac density. Electronically Signed   By: Inez Catalina M.D.   On: 09/08/2017 08:56     STUDIES:  PCXR from today reviewed. I think the retrocardiac density is more compatible with LLL atelectasis by comparison with the 7/31 film  CULTURES: Tracheal aspirate 7/31: nl flora BC neg to date  ANTIBIOTICS: Rocephin  LINES/TUBES: ETT Foley  DISCUSSION: 67 year old female with prior MCA stroke, now presents with status epilepticus.  Critical care asked to support airway for burst suppression therapy.  Long discussion with family including daughter and son.  Plan will be for another 24 hours of aggressive care in hopes to stop seizure activity.  Family does not want prolonged aggressive therapy particularly given the overall poor prognosis.  ASSESSMENT / PLAN:  PULMONARY A: Intubated for airway protection with burst suppression on 09/05/2017 CO2 on ABG is 41.5 this  P:   Not ready for SBT or weaning  CARDIOVASCULAR A:  HTN P:  Hydralazine for BP control  RENAL A:   Nl indicies and lytes P:   Trend  GASTROINTESTINAL A:   No acute path but prior poor nutrition P:   TF GI prophylaxis  INFECTIOUS A:   On Abx for Kleb UTI, Today is D7  P:   Will D/C after today  NEUROLOGIC A:   Status epilepticus P:   Continue antiepileptic drugs per neurology: Currently Dilantin, phenobarbital, Vimpat and Keppra Burst suppression protocol Continuous EEG  Critical care time: 30 min  Pulmonary and [place] Pager: [phone]  09/08/2017, 2:28 PM

## 2017-09-08 NOTE — Progress Notes (Signed)
Subjective: On Versed gtt at 45 mg/hr.   Objective: Current vital signs: BP 133/68   Pulse 87   Temp 97.6 F (36.4 C) (Axillary)   Resp 18   Ht '5\' 2"'$  (1.575 m)   Wt 64.8 kg (142 lb 13.7 oz)   SpO2 100%   BMI 26.13 kg/m  Vital signs in last 24 hours: Temp:  [97.6 F (36.4 C)-101.3 F (38.5 C)] 97.6 F (36.4 C) (08/03 0800) Pulse Rate:  [81-119] 87 (08/03 0800) Resp:  [17-34] 18 (08/03 0800) BP: (129-182)/(60-80) 133/68 (08/03 0800) SpO2:  [75 %-100 %] 100 % (08/03 0800) FiO2 (%):  [30 %] 30 % (08/03 0334)  Intake/Output from previous day: 08/02 0701 - 08/03 0700 In: 4066.1 [I.V.:2326.4; NG/GT:1250; IV Piggyback:489.7] Out: 1225 [Urine:1225] Intake/Output this shift: Total I/O In: 50 [NG/GT:50] Out: 60 [Urine:60] Nutritional status:  Diet Order           Diet NPO time specified  Diet effective now          HEENT: [place]/AT Lungs:Intubated, on ventilator Ext: Warm and well perfused  Neurologic Exam:Sedated on Versed Ment:Sedated with no responses to external stimuli. FW:YOVZ flaccidly symmetric. Motor/Sensory:Tone decreased all 4 extremities, with some residual increased tone to RUE and RLE on sedation.    Lab Results: Results for orders placed or performed during the hospital encounter of 08/22/2017 (from the past 48 hour(s))  Glucose, capillary     Status: Abnormal   Collection Time: 09/06/17 12:36 PM  Result Value Ref Range   Glucose-Capillary 104 (H) 70 - 99 mg/dL  Glucose, capillary     Status: Abnormal   Collection Time: 09/06/17  5:32 PM  Result Value Ref Range   Glucose-Capillary 112 (H) 70 - 99 mg/dL  Glucose, capillary     Status: Abnormal   Collection Time: 09/06/17  8:12 PM  Result Value Ref Range   Glucose-Capillary 135 (H) 70 - 99 mg/dL  Glucose, capillary     Status: Abnormal   Collection Time: 09/06/17 11:58 PM  Result Value Ref Range   Glucose-Capillary 125 (H) 70 - 99 mg/dL  CBC with Differential/Platelet     Status: Abnormal    Collection Time: 09/07/17  3:17 AM  Result Value Ref Range   WBC 8.9 4.0 - 10.5 K/uL   RBC 4.19 3.87 - 5.11 MIL/uL   Hemoglobin 13.1 12.0 - 15.0 g/dL   HCT 41.0 36.0 - 46.0 %   MCV 97.9 78.0 - 100.0 fL   MCH 31.3 26.0 - 34.0 pg   MCHC 32.0 30.0 - 36.0 g/dL   RDW 13.4 11.5 - 15.5 %   Platelets 147 (L) 150 - 400 K/uL   Neutrophils Relative % 71 %   Neutro Abs 6.4 1.7 - 7.7 K/uL   Lymphocytes Relative 19 %   Lymphs Abs 1.7 0.7 - 4.0 K/uL   Monocytes Relative 7 %   Monocytes Absolute 0.6 0.1 - 1.0 K/uL   Eosinophils Relative 3 %   Eosinophils Absolute 0.2 0.0 - 0.7 K/uL   Basophils Relative 0 %   Basophils Absolute 0.0 0.0 - 0.1 K/uL   Immature Granulocytes 0 %   Abs Immature Granulocytes 0.0 0.0 - 0.1 K/uL    Comment: Performed at [facility], [address] [place], [place] [address]  Comprehensive metabolic panel     Status: Abnormal   Collection Time: 09/07/17  3:17 AM  Result Value Ref Range   Sodium 140 135 - 145 mmol/L  Potassium 4.0 3.5 - 5.1 mmol/L   Chloride 111 98 - 111 mmol/L   CO2 21 (L) 22 - 32 mmol/L   Glucose, Bld 137 (H) 70 - 99 mg/dL   BUN 12 8 - 23 mg/dL   Creatinine, Ser 0.46 0.44 - 1.00 mg/dL   Calcium 8.4 (L) 8.9 - 10.3 mg/dL   Total Protein 4.9 (L) 6.5 - 8.1 g/dL   Albumin 2.5 (L) 3.5 - 5.0 g/dL   AST 18 15 - 41 U/L   ALT 12 0 - 44 U/L   Alkaline Phosphatase 61 38 - 126 U/L   Total Bilirubin 0.3 0.3 - 1.2 mg/dL   GFR calc non Af Amer >60 >60 mL/min   GFR calc Af Amer >60 >60 mL/min    Comment: (NOTE) The eGFR has been calculated using the CKD EPI equation. This calculation has not been validated in all clinical situations. eGFR's persistently <60 mL/min signify possible Chronic Kidney Disease.    Anion gap 8 5 - 15    Comment: Performed at [facility] [address] [place], [place] [address]  CK     Status: Abnormal   Collection Time: 09/07/17  3:17 AM  Result Value Ref Range   Total CK 33 (L) 38 - 234 U/L    Comment:  Performed at [facility], [place] [address], [place], [place] [address]  Magnesium     Status: Abnormal   Collection Time: 09/07/17  3:17 AM  Result Value Ref Range   Magnesium 1.6 (L) 1.7 - 2.4 mg/dL    Comment: Performed at [facility] [address], [place], [place] [address]  Phosphorus     Status: None   Collection Time: 09/07/17  3:17 AM  Result Value Ref Range   Phosphorus 4.1 2.5 - 4.6 mg/dL    Comment: Performed at [facility] [address], [place], [place] [address]  Protime-INR     Status: None   Collection Time: 09/07/17  3:17 AM  Result Value Ref Range   Prothrombin Time 14.6 11.4 - 15.2 seconds   INR 1.15     Comment: Performed at [facility] [address] [place], [place] [address]  Blood gas, arterial     Status: Abnormal   Collection Time: 09/07/17  4:18 AM  Result Value Ref Range   FIO2 30.00    Delivery systems VENTILATOR    Mode PRESSURE REGULATED VOLUME CONTROL    VT 400 mL   LHR 18 resp/min   Peep/cpap 5.0 cm H20   pH, Arterial 7.338 (L) 7.350 - 7.450   pCO2 arterial 41.5 32.0 - 48.0 mmHg   pO2, Arterial 131 (H) 83.0 - 108.0 mmHg   Bicarbonate 21.7 20.0 - 28.0 mmol/L   Acid-base deficit 3.2 (H) 0.0 - 2.0 mmol/L   O2 Saturation 98.7 %   Patient temperature 98.2    Collection site LEFT RADIAL    Drawn by [id number]    Sample type ARTERIAL DRAW    Allens test (pass/fail) PASS PASS  Phenytoin level, total     Status: None   Collection Time: 09/07/17  1:06 PM  Result Value Ref Range   Phenytoin Lvl 15.4 10.0 - 20.0 ug/mL    Comment: Performed at [facility], [place] [address], [place], [place] [address]  CBC with Differential/Platelet     Status: Abnormal   Collection Time: 09/08/17  2:46 AM  Result Value Ref Range  WBC 8.7 4.0 - 10.5 K/uL   RBC 4.18 3.87 - 5.11 MIL/uL   Hemoglobin 13.2 12.0 - 15.0 g/dL   HCT 40.1 36.0 - 46.0 %   MCV 95.9 78.0 - 100.0 fL   MCH 31.6 26.0 - 34.0 pg   MCHC 32.9 30.0 - 36.0 g/dL   RDW  13.5 11.5 - 15.5 %   Platelets 143 (L) 150 - 400 K/uL   Neutrophils Relative % 68 %   Neutro Abs 5.8 1.7 - 7.7 K/uL   Lymphocytes Relative 23 %   Lymphs Abs 2.0 0.7 - 4.0 K/uL   Monocytes Relative 8 %   Monocytes Absolute 0.7 0.1 - 1.0 K/uL   Eosinophils Relative 1 %   Eosinophils Absolute 0.1 0.0 - 0.7 K/uL   Basophils Relative 0 %   Basophils Absolute 0.0 0.0 - 0.1 K/uL   Immature Granulocytes 0 %   Abs Immature Granulocytes 0.0 0.0 - 0.1 K/uL    Comment: Performed at [facility] [address] [place], [place] [address]  Basic metabolic panel     Status: Abnormal   Collection Time: 09/08/17  2:46 AM  Result Value Ref Range   Sodium 139 135 - 145 mmol/L   Potassium 3.6 3.5 - 5.1 mmol/L   Chloride 109 98 - 111 mmol/L   CO2 25 22 - 32 mmol/L   Glucose, Bld 152 (H) 70 - 99 mg/dL   BUN 12 8 - 23 mg/dL   Creatinine, Ser 0.40 (L) 0.44 - 1.00 mg/dL   Calcium 8.0 (L) 8.9 - 10.3 mg/dL   GFR calc non Af Amer >60 >60 mL/min   GFR calc Af Amer >60 >60 mL/min    Comment: (NOTE) The eGFR has been calculated using the CKD EPI equation. This calculation has not been validated in all clinical situations. eGFR's persistently <60 mL/min signify possible Chronic Kidney Disease.    Anion gap 5 5 - 15    Comment: Performed at [facility] [address], [place], [place] [address]  Magnesium     Status: None   Collection Time: 09/08/17  2:46 AM  Result Value Ref Range   Magnesium 1.8 1.7 - 2.4 mg/dL    Comment: Performed at [facility] [address] [place], [place] [address]  Blood gas, arterial     Status: Abnormal   Collection Time: 09/08/17  3:49 AM  Result Value Ref Range   FIO2 0.30    Delivery systems VENTILATOR    Mode PRESSURE REGULATED VOLUME CONTROL    VT 400 mL   LHR 18 resp/min   Peep/cpap 5.0 cm H20   pH, Arterial 7.348 (L) 7.350 - 7.450   pCO2 arterial 44.8 32.0 - 48.0 mmHg   pO2, Arterial 115 (H) 83.0 - 108.0 mmHg   Bicarbonate 24.0 20.0 -  28.0 mmol/L   Acid-base deficit 0.9 0.0 - 2.0 mmol/L   O2 Saturation 98.1 %   Patient temperature 98.6    Collection site RIGHT RADIAL    Drawn by [phone]    Sample type ARTERIAL DRAW    Allens test (pass/fail) PASS PASS  Glucose, capillary     Status: Abnormal   Collection Time: 09/08/17  3:50 AM  Result Value Ref Range   Glucose-Capillary 134 (H) 70 - 99 mg/dL  Glucose, capillary     Status: Abnormal   Collection Time: 09/08/17  8:43 AM  Result Value Ref Range   Glucose-Capillary 141 (H) 70 -  99 mg/dL    Recent Results (from the past 240 hour(s))  MRSA PCR Screening     Status: None   Collection Time: 08/14/2017 11:27 PM  Result Value Ref Range Status   MRSA by PCR NEGATIVE NEGATIVE Final    Comment:        The GeneXpert MRSA Assay (FDA approved for NASAL specimens only), is one component of a comprehensive MRSA colonization surveillance program. It is not intended to diagnose MRSA infection nor to guide or monitor treatment for MRSA infections. Performed at [facility], [place] [address] [place], [place] [address]   Culture, blood (routine x 2)     Status: None   Collection Time: 09/02/17 12:00 PM  Result Value Ref Range Status   Specimen Description BLOOD LEFT HAND  Final   Special Requests   Final    BOTTLES DRAWN AEROBIC ONLY Blood Culture adequate volume   Culture   Final    NO GROWTH 5 DAYS Performed at [facility], [place] [address] [place], [place] [address]    Report Status 09/07/2017 FINAL  Final  Culture, blood (routine x 2)     Status: None   Collection Time: 09/02/17 12:05 PM  Result Value Ref Range Status   Specimen Description BLOOD RIGHT HAND  Final   Special Requests   Final    BOTTLES DRAWN AEROBIC ONLY Blood Culture adequate volume   Culture   Final    NO GROWTH 5 DAYS Performed at [facility], [place] [address], [place], [place] [address]    Report Status 09/07/2017 FINAL  Final  MRSA PCR Screening     Status: None    Collection Time: 09/05/17 10:46 AM  Result Value Ref Range Status   MRSA by PCR NEGATIVE NEGATIVE Final    Comment:        The GeneXpert MRSA Assay (FDA approved for NASAL specimens only), is one component of a comprehensive MRSA colonization surveillance program. It is not intended to diagnose MRSA infection nor to guide or monitor treatment for MRSA infections. Performed at [facility], [place] [address], [place], [place] [address]   Culture, Urine     Status: None   Collection Time: 09/05/17 11:27 AM  Result Value Ref Range Status   Specimen Description URINE, CATHETERIZED  Final   Special Requests Immunocompromised  Final   Culture   Final    NO GROWTH Performed at [facility], [place] [address], [place], [place] [address]    Report Status 09/06/2017 FINAL  Final  Culture, respiratory (non-expectorated)     Status: None   Collection Time: 09/05/17 11:51 AM  Result Value Ref Range Status   Specimen Description TRACHEAL ASPIRATE  Final   Special Requests NONE  Final   Gram Stain   Final    MODERATE WBC PRESENT,BOTH PMN AND MONONUCLEAR NO ORGANISMS SEEN    Culture   Final    FEW Consistent with normal respiratory flora. Performed at [facility], [place] [address] [place], [place] [address]    Report Status 09/07/2017 FINAL  Final    Lipid Panel Recent Labs    09/05/17 1221  TRIG 120    Studies/Results: Dg Chest Port 1 View  Result Date: 09/08/2017 CLINICAL DATA:  Aspiration pneumonia EXAM: PORTABLE CHEST 1 VIEW COMPARISON:  09/05/2017 FINDINGS: Endotracheal tube and nasogastric catheter are well seen and stable. Cardiac shadow is within normal limits. Mild left retrocardiac density  is noted slightly increased from the prior exam. No other focal infiltrate is noted. No bony abnormality is noted. IMPRESSION: Slight increase in left basilar retrocardiac density. Electronically Signed   By: Inez Catalina M.D.   On: 09/08/2017 08:56    Medications:   Scheduled: . aspirin  81 mg Oral Daily  . atorvastatin  40 mg Oral Daily  . chlorhexidine gluconate (MEDLINE KIT)  15 mL Mouth Rinse BID  . clopidogrel  75 mg Oral Daily  . enoxaparin (LOVENOX) injection  40 mg Subcutaneous Q24H  . feeding supplement (PRO-STAT SUGAR FREE 64)  30 mL Per Tube BID  . feeding supplement (VITAL HIGH PROTEIN)  1,000 mL Per Tube Q24H  . fentaNYL (SUBLIMAZE) injection  50 mcg Intravenous Once  . mouth rinse  15 mL Mouth Rinse 10 times per day  . PHENObarbital  65 mg Intravenous QHS  . phenytoin (DILANTIN) IV  100 mg Intravenous Q8H  . sodium chloride flush  3 mL Intravenous Q12H   Continuous: . sodium chloride Stopped (09/05/17 1400)  . cefTRIAXone (ROCEPHIN)  IV Stopped (09/07/17 1219)  . dextrose 5 % and 0.45 % NaCl with KCl 20 mEq/L 75 mL/hr at 09/07/17 2216  . famotidine (PEPCID) IV Stopped (09/07/17 1440)  . fentaNYL infusion INTRAVENOUS 25 mcg/hr (09/07/17 2008)  . lacosamide (VIMPAT) IV 200 mg (09/07/17 2123)  . levETIRAcetam 1,500 mg (09/08/17 9675)  . midazolam (VERSED) infusion 55 mg/hr (09/08/17 0806)  . propofol (DIPRIVAN) infusion Stopped (09/07/17 1447)   LTM EEG 09/07/2017 at 07:30 AM to 09/08/2017 at 7:30 AM: Day 6: Initial burst suppression pattern evolved into more continues background activities with superimposed to very frequent near continues multifocal spike and wave and polyspike and wave discharges involving independently left frontal left temporal right frontocentral and right temporal region.  This pattern continued to between 1 PM to approximately 5 PM.  After that background activities marked by burst and attenuation pattern without frequent interictal epileptiform discharges.  There was no clinical subclinical seizures present. Clinical interpretation: This day 6 of intensive EEG monitoring with simultaneous video monitoring did not demonstrate any clinical or  subclinical seizures.  Background activities marked by burst suppression  pattern alternating with more continues background activities and superimposed very frequent near continuous multifocal spike and wave discharges as discussed above.  These findings suggestive of still significant degree of cortical irritability involving both hemispheres.    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 Overall presentation is most consistent with ongoing seizure activity since Wednesday,7/24 which was refractory to multiple anticonvulsants but resolved with burst suppression on Versed and propofol x 48 hours. Weaning off burst suppression on 8/2 revealed recrudescence of epileptiform discharges manifesting as very frequent near continuous multifocal spike and wave discharges. A second trial of 48 hours' burst suppression has been initiated, this time with Versed as the sole sedating agent to limit risk of propofol-induced complications.  1.Now in burst suppression on Versed, which has been titrated up to 55 mg/hr towards the goal of an optimized burst suppression pattern.  2. Continuingphenobarbital,Vimpat,Keppra and Dilantin.  3. 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  2.ContinueDilantinwith pharmacy to dose. 3. Continuephenobarbital,Vimpat and Keppra 4.Continuous pulse oximetry and cardiac telemetry. 5. Frequent neuro checks. 6. Continue Versed at 55 mg/hr.   35 minutes spent in the neurological evaluation and management of this critically ill patient with status epilepticus. Time included EEG review and family education.  LOS: 7 days   '@Electronically'$  signed: Dr. Kerney Elbe 09/08/2017  9:04 AM

## 2017-09-08 NOTE — Procedures (Signed)
Electroencephalography report.  Long-term monitoring  Data acquisition: [employer] 10-20 for eligible placement.  18 channels EEG with additional EKG channel  Recording begins 09/07/2017 at 07 30  AM Recording and 08/3 /2019 at 7:30 AM  Number of study day 6  CPT 95951  This intensive EEG monitoring with simultaneous video monitoring to provide this patient with history of stroke and now cognitive and functional decline to rule out clinical and subclinical electrographic seizures  Day 1 : There was one pushbutton activation events however appeared to be an accident.  Background activities marked by disorganized background activity slowing ranging between 5 to 6 cps with me with admixed faster frequencies as well as delta slowing throughout the recording without posterior dominant rhythm.  Superimposed near continues left posterior polyspike and wave discharges present at times was brought negative field and periodic fashion suggestive of left lateralizing periodic  epileptiform discharges.  At times this left posterior polyspike and wave discharges become rhythmic synchronized with evolving features in morphology frequency amplitude and extension of negative field in a right parieto-occipital cortex consistent with electrographic seizures.  During second half of the recording background activities obscured by significant electrode artifact however when there is a time with a relative absence of electrode artifact it appears that patient continue to have electrographic seizures as discussed above.  There was no obvious clinical correlate to this electrographic seizures.  Day 2: Background activities marked by 5 to 6 cps background activities with admixed slower frequencies in the delta range without well-defined posterior dominant rhythm.  Superimposed near continuous left parieto-occipital polyspike and wave discharges present throughout the recording at times negative field extending to the right  parieto-occipital cortex.  In addition frequent electrographic seizures still noted most likely with onset across left parieto-occipital cortex with evolution morphology frequency amplitude and propagation of negative field to the right parieto-occipital region.  There is no obvious clinical accompaniment.  EEG was technically difficult due to  motion artifact  Day 3: Background activities marked by background activity slowing distributed broadly.  Superimposed continues non-remitting 1 cps periodic polyspike and wave discharges with maximum negativity in the left parieto-occipital cortex present throughout the recording.  During second half of the recording this left hemispheric discharges become more blunted and not as well developed suggesting of some degree of improvement however these discharges occur still 1 cps frequency. There is also independent right posterior sharp waves maximum negativity right parieto-occipital cortex present throughout the recording however significantly less developed.  Occasionally electrographic seizures present involving a left parieto-occipital and propagating to the right parietal cortex without obvious clinical accompaniment.  Day 4: Burst suppression pattern was achieved around 12 noon.  Since that time background activities remain marked by burst and suppression pattern with 3-3 ratio.  No seizures noted.  Occasionally within those burst left frontotemporal and independent right frontal and temporal sharp waves and spikes present throughout the recording.  Day 5: Background activities marked by burst suppression pattern throughout the recording ranging between 3-3 and 2-3 ratio.  Left frontotemporal and right frontal sharp waves and spikes occasionally present throughout the recording.  There is no clinical or subclinical seizures.  Day 6: Initial burst suppression pattern evolved into more continues background activities with superimposed to very frequent near  continues multifocal spike and wave and polyspike and wave discharges involving independently left frontal left temporal right frontocentral and right temporal region.  This pattern continued to between 1 PM to approximately 5 PM.  After that background activities marked  by burst and attenuation pattern without frequent interictal epileptiform discharges.  There was no clinical subclinical seizures present.  Clinical interpretation: This day 6 of intensive EEG monitoring with simultaneous video monitoring did not demonstrate any clinical or  subclinical seizures.  Background activities marked by burst suppression pattern alternating with more continues background activities and superimposed very frequent near continuous multifocal spike and wave discharges as discussed above.  These findings suggestive of still significant degree of cortical irritability involving both hemispheres.  Clinical correlation is advised.

## 2017-09-08 NOTE — Progress Notes (Addendum)
Daily Progress Note   Patient Name: Ebony Williams       Date: 09/08/2017 DOB: 11/29/1950  Age: 67 y.o. MRN#: [id number] Attending Physician: Garner Nash, DO Primary Care Physician: Sharilyn Sites, MD Admit Date: 09/02/2017  Reason for Consultation/Follow-up: Establishing goals of care, Non pain symptom management, Pain control, Psychosocial/spiritual support, Terminal Care and Withdrawal of life-sustaining treatment  Subjective: Met with patient, chart reviewed.  Daughter, Morey Hummingbird, is at the bedside.  Morey Hummingbird is quite tearful but excepting that her mother "would not want to live like this".  Patient's husband is not at the bedside.  Versed was restarted in the night because of seizure activity and has since been uptitrated to 55 mg an hour.  Morey Hummingbird shares with me after discussion with neurologist that they are planning to meet with neurology again tomorrow at 4 PM to talk about moving towards comfort care after 24 hours of additional versed  Length of Stay: 7  Current Medications: Scheduled Meds:  . aspirin  81 mg Oral Daily  . atorvastatin  40 mg Oral Daily  . chlorhexidine gluconate (MEDLINE KIT)  15 mL Mouth Rinse BID  . clopidogrel  75 mg Oral Daily  . enoxaparin (LOVENOX) injection  40 mg Subcutaneous Q24H  . feeding supplement (PRO-STAT SUGAR FREE 64)  30 mL Per Tube BID  . feeding supplement (VITAL HIGH PROTEIN)  1,000 mL Per Tube Q24H  . fentaNYL (SUBLIMAZE) injection  50 mcg Intravenous Once  . mouth rinse  15 mL Mouth Rinse 10 times per day  . PHENObarbital  65 mg Intravenous QHS  . phenytoin (DILANTIN) IV  100 mg Intravenous Q8H  . sodium chloride flush  3 mL Intravenous Q12H    Continuous Infusions: . sodium chloride Stopped (09/05/17 1400)  . cefTRIAXone (ROCEPHIN)   IV Stopped (09/08/17 1232)  . dextrose 5 % and 0.45 % NaCl with KCl 20 mEq/L Stopped (09/08/17 1416)  . famotidine (PEPCID) IV 100 mL/hr at 09/08/17 1417  . fentaNYL infusion INTRAVENOUS 25 mcg/hr (09/07/17 2008)  . lacosamide (VIMPAT) IV Stopped (09/08/17 1111)  . levETIRAcetam Stopped (09/08/17 0092)  . midazolam (VERSED) infusion 55 mg/hr (09/08/17 1417)  . propofol (DIPRIVAN) infusion Stopped (09/07/17 1447)    PRN Meds: acetaminophen (TYLENOL) oral liquid 160 mg/5 mL, bisacodyl, fentaNYL, hydrALAZINE, nystatin  Physical Exam  Constitutional: She appears well-developed and well-nourished.  Acutely ill-appearing elderly female.  She is intubated, unresponsive  HENT:  Head: Normocephalic and atraumatic.  Cardiovascular: Normal rate.  Pulmonary/Chest:  Intubated  Genitourinary:  Genitourinary Comments: Foley  Neurological:  Unresponsive  Skin: Skin is warm and dry.  Psychiatric:  No overt agitation otherwise unable to test.  Patient on heavy sedation secondary to ongoing seizure activity  Nursing note and vitals reviewed.           Vital Signs: BP 129/64   Pulse 84   Temp 98.3 F (36.8 C) (Axillary)   Resp 18   Ht '5\' 2"'$  (1.575 m)   Wt 64.8 kg (142 lb 13.7 oz)   SpO2 100%   BMI 26.13 kg/m  SpO2: SpO2: 100 % O2 Device: O2 Device: Ventilator O2 Flow Rate:    Intake/output summary:   Intake/Output Summary (Last 24 hours) at 09/08/2017 1453 Last data filed at 09/08/2017 1417 Gross per 24 hour  Intake 5046.04 ml  Output 965 ml  Net 4081.04 ml   LBM: Last BM Date: 09/05/17 Baseline Weight: Weight: 67.6 kg (149 lb) Most recent weight: Weight: 64.8 kg (142 lb 13.7 oz)       Palliative Assessment/Data:    Flowsheet Rows     Most Recent Value  Intake Tab  Referral Department  -- [ED]  Unit at Time of Referral  ER  Palliative Care Primary Diagnosis  Neurology  Date Notified  09/03/2017  Palliative Care Type  Return patient Palliative Care  Reason for referral   Clarify Goals of Care  Date of Admission  08/19/2017  Date first seen by Palliative Care  09/03/17  # of days Palliative referral response time  2 Day(s)  # of days IP prior to Palliative referral  0  Clinical Assessment  Psychosocial & Spiritual Assessment  Palliative Care Outcomes      Patient Active Problem List   Diagnosis Date Noted  . Metabolic encephalopathy 09/81/1914  . UTI due to Klebsiella species 08/20/2017  . Seizure, late effect of stroke ([employer]) 08/14/2017  . 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 Malnutrition of moderate degree 08/06/2017  . DNR (do not resuscitate) discussion   . Seizure ([place])   . Palliative care by specialist   . Goals of care, counseling/discussion   . Altered mental status 08/01/2017  . Right arm weakness 06/16/2017  . Aphasia 06/16/2017  . Spastic hemiplegia of right dominant side as late effect of cerebral infarction ([place])   . Hyponatremia   . Labile blood pressure   . Hypokalemia   . Elevated BUN   . Lethargy   . Transaminitis   . Essential hypertension   . Hypertensive crisis   . 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 Depression   . Dysphagia, post-stroke   . Diastolic dysfunction   . Benign essential HTN   . Prediabetes   . Leukocytosis   . 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 Stroke (cerebrum) ([place]) 04/06/2017  . 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 Pain in joint, shoulder region 10/18/2011    Palliative Care Assessment & Plan   Patient Profile: This 67 year old female past medical history of large MCA stroke in March 2019. Since then per the family she has had significant difficulty in her recovery. At her best she is wheelchair-bound with significant right-sided hemiparesis. She was admitted to the hospital on 08/06/2017. Critical care was paged by neurology  this morning for ongoing status epilepticus. Since admission she is currently received  phenobarbital, Vimpat, Keppra as well as Dilantin. After discussion with neurology as well as the hospitalist the recommendation was for admission to the intensive care unit, intubation and initiation of burst suppression through sedation.   Consult ordered for goals of care.  As of 09/07/2017, patient remains intubated.  She is now off Versed but remains on 4 anticonvulsants and minimally responsive.  However, during the evening of 09/08/2017 Versed had to be restarted because of resumption of seizure activity.  Per palliative medicine provider, Fenton Malling, NP, patient's family shares that she would not want to remain in this debilitated state where she unable able to communicate or improve  Daughter shares that the plan for tomorrow is to meet with neurology, her stepfather and herself and palliative medicine to discuss potentially one-way extubation and movement towards comfort care.  Carrie at this point seems ready for her mother to "not suffer", and she hopes she does not linger after removal of ventilator support    Recommendations/Plan:  Palliative medicine plans to be at meeting on 09/09/2017 at 4 PM to discuss comfort care and potentially one-way extubation.  Daughter Morey Hummingbird, did asked specific questions on how we go about liberating people from ventilators and we did discuss role of medications to maintain respiratory comfort.  Did prepare Morey Hummingbird that death could happens fairly soon after extubation but also patient could have a prognosis of days.  Goals of Care and Additional Recommendations:  Limitations on Scope of Treatment: No Blood Transfusions, No Chemotherapy, No Hemodialysis, No Radiation, No Surgical Procedures and No Tracheostomy  Code Status:    Code Status Orders  (From admission, onward)        Start     Ordered   09/05/17 1204  Limited resuscitation (code)  Continuous    Question Answer Comment  In the event of cardiac or respiratory ARREST: Initiate Code Blue,  Call Rapid Response No   In the event of cardiac or respiratory ARREST: Perform CPR No   In the event of cardiac or respiratory ARREST: Perform Intubation/Mechanical Ventilation Yes   In the event of cardiac or respiratory ARREST: Use NIPPV/BiPAp only if indicated Yes   In the event of cardiac or respiratory ARREST: Administer ACLS medications if indicated No   In the event of cardiac or respiratory ARREST: Perform Defibrillation or Cardioversion if indicated No   Comments Already intubated      09/05/17 1204    Code Status History    Date Active Date Inactive Code Status Order ID Comments User Context   08/13/2017 1711 09/03/2017 1311 DNR [id number]  Lady Deutscher, MD ED   08/01/2017 2332 08/07/2017 1911 Partial Code [id number]  Oswald Hillock, MD Inpatient   06/16/2017 1622 06/22/2017 1755 Full Code [id number]  Rondel Jumbo, PA-C ED   04/11/2017 1640 05/08/2017 1849 Full Code [id number]  Cathlyn Parsons, PA-C Inpatient   04/11/2017 1640 04/11/2017 1640 Full Code [id number]  Cathlyn Parsons, PA-C Inpatient   04/06/2017 2220 04/11/2017 1634 Full Code [id number]  Kerney Elbe, MD Inpatient    Advance Directive Documentation     Most Recent Value  Type of Advance Directive  Out of facility DNR (pink MOST or yellow form)  Pre-existing out of facility DNR order (yellow form or pink MOST form)  -  "MOST" Form in Place?  -       Prognosis:   Hours - Days if full  comfort care, one-way extubation is performed, in the setting of MCA stroke now in status epilepticus   Discharge Planning:  If one-way extubation is performed, would anticipate hospital death versus residential hospice    Thank you for allowing the Palliative Medicine Team to assist in the care of this patient.   Time In: 1130 Time Out: 1230 Total Time 60 min Prolonged Time Billed  no       Greater than 50%  of this time was spent counseling and coordinating care related to the above assessment and plan.  Dory Horn,  NP  Please contact Palliative Medicine Team phone at [phone] for questions and concerns.

## 2017-09-09 LAB — CBC WITH DIFFERENTIAL/PLATELET
Abs Immature Granulocytes: 0 10*3/uL (ref 0.0–0.1)
BASOS ABS: 0 10*3/uL (ref 0.0–0.1)
BASOS PCT: 0 %
EOS ABS: 0.3 10*3/uL (ref 0.0–0.7)
EOS PCT: 3 %
HCT: 35.9 % — ABNORMAL LOW (ref 36.0–46.0)
HEMOGLOBIN: 11.2 g/dL — AB (ref 12.0–15.0)
Immature Granulocytes: 0 %
Lymphocytes Relative: 25 %
Lymphs Abs: 2.3 10*3/uL (ref 0.7–4.0)
MCH: 31.1 pg (ref 26.0–34.0)
MCHC: 31.2 g/dL (ref 30.0–36.0)
MCV: 99.7 fL (ref 78.0–100.0)
MONO ABS: 1.1 10*3/uL — AB (ref 0.1–1.0)
Monocytes Relative: 12 %
Neutro Abs: 5.6 10*3/uL (ref 1.7–7.7)
Neutrophils Relative %: 60 %
PLATELETS: 138 10*3/uL — AB (ref 150–400)
RBC: 3.6 MIL/uL — ABNORMAL LOW (ref 3.87–5.11)
RDW: 13.8 % (ref 11.5–15.5)
WBC: 9.4 10*3/uL (ref 4.0–10.5)

## 2017-09-09 LAB — GLUCOSE, CAPILLARY
GLUCOSE-CAPILLARY: 137 mg/dL — AB (ref 70–99)
GLUCOSE-CAPILLARY: 163 mg/dL — AB (ref 70–99)
GLUCOSE-CAPILLARY: 129 mg/dL — AB (ref 70–99)
GLUCOSE-CAPILLARY: 126 mg/dL — AB (ref 70–99)
GLUCOSE-CAPILLARY: 138 mg/dL — AB (ref 70–99)
Glucose-Capillary: 117 mg/dL — ABNORMAL HIGH (ref 70–99)
Glucose-Capillary: 123 mg/dL — ABNORMAL HIGH (ref 70–99)
Glucose-Capillary: 126 mg/dL — ABNORMAL HIGH (ref 70–99)
Glucose-Capillary: 128 mg/dL — ABNORMAL HIGH (ref 70–99)
Glucose-Capillary: 149 mg/dL — ABNORMAL HIGH (ref 70–99)
Glucose-Capillary: 136 mg/dL — ABNORMAL HIGH (ref 70–99)

## 2017-09-09 NOTE — Progress Notes (Signed)
PULMONARY / CRITICAL CARE MEDICINE   Name: Ebony Williams MRN: [id number] DOB: 25-Dec-1950    ADMISSION DATE:  08/21/2017 CONSULTATION DATE:  09/06/2017  REFERRING MD:  Dr. 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 Consequently this is resulted in seizures. Additional medical concerns: Recent UTI, hypertension, immobility. On her best day post stroke she was wheelchair-bound, still having right hemiparesis with spasticity. She was verbal,, fully alert, oriented to person place and time but had some moderate expressive aphasia. More recently she was just discharged from the hospital to skilled nursing facility following urinary tract infection. On 7/27 with altered mental status. She wasAlso unableto take her oral medications. She was admitted with a concern for subclinical seizure, CT and MRI imaging were obtained, as were neurological consult. An EEG was obtained demonstrating left hemisphere seizure, she was admitted for titration of her anti-epileptic medications. On 729 continuous EEG was initiated she continued to have semi-process of whole movement to the left upper and left lower extremity but remained in a coma. EEG evaluation showed subclinical seizure activity, AEDs were once again titrated. As of 7/31 she had since had the addition of phenobarbital, on top of Vimpat and Keppra as well as Dilantin. Based on ongoing status it was decided she should be transferred to the intensive care for burst suppression protocol. Critical care was asked to evaluate and assist with airway management.  PAST MEDICAL HISTORY :  She  has a past medical history of History of kidney stones and Hypertension.  PAST SURGICAL HISTORY: She  has a past surgical history that includes  Cholecystectomy; Tubal ligation; and Colonoscopy with propofol (N/A, 01/12/2015).  Allergies  Allergen Reactions  . Sulfa Antibiotics Diarrhea and Itching    No current facility-administered medications on file prior to encounter.    Current Outpatient Medications on File Prior to Encounter  Medication Sig  . acetaminophen (TYLENOL) 325 MG tablet Take 2 tablets (650 mg total) by mouth every 6 (six) hours as needed for mild pain, fever or headache. (Patient taking differently: Take 650 mg by mouth every 6 (six) hours as needed for fever (general discomfort). )  . ALPRAZolam (XANAX) 0.5 MG tablet Take 1 tablet (0.5 mg total) by mouth 3 (three) times daily as needed for anxiety or sleep. (Patient taking differently: Take 0.5 mg by mouth every 8 (eight) hours as needed for anxiety or sleep. )  . amantadine (SYMMETREL) 100 MG capsule Take 2 capsules (200 mg total) by mouth 2 (two) times daily.  [name] amLODipine (NORVASC) 10 MG tablet Take 1 tablet (10 mg total) by mouth daily.  [name] aspirin 81 MG chewable tablet Chew 1 tablet (81 mg total) by mouth daily.  [name] atorvastatin (LIPITOR) 40 MG tablet Take 1 tablet (40 mg total) by mouth daily.  . cephALEXin (KEFLEX) 500 MG capsule Take 500 mg by mouth every 12 (twelve) hours. 7 day course (for UTI) started 08/28/17 pm  . clopidogrel (PLAVIX) 75 MG tablet Take 1 tablet (75 mg total) by mouth daily.  [name] FLUoxetine (PROZAC) 40 MG capsule Take 1 capsule (40 mg total) by mouth at bedtime.  . levETIRAcetam (KEPPRA) 100 MG/ML solution Take 1,500 mg by mouth 2 (two) times daily.  [name] lisinopril (PRINIVIL,ZESTRIL) 5 MG tablet Take 5 mg by mouth daily.  [name]  metFORMIN (GLUCOPHAGE) 500 MG tablet Take 1 tablet (500 mg total) by mouth daily with breakfast.  . nystatin (NYSTATIN) powder Apply topically See admin instructions. Apply topically to groin every 12 hours as needed for rash/irritation  . ondansetron (ZOFRAN) 4 MG tablet Take 4 mg by mouth every 8 (eight) hours as needed  for nausea or vomiting.  . pantoprazole (PROTONIX) 40 MG tablet Take 1 tablet (40 mg total) by mouth daily.  . polyethylene glycol (MIRALAX / GLYCOLAX) packet Take 17 g by mouth daily. Mix with 8-10 oz water and drink  . potassium chloride SA (K-DUR,KLOR-CON) 20 MEQ tablet Take 20 mEq by mouth daily.  [name] PRESCRIPTION MEDICATION See admin instructions. Administer normal saline 1000 ml @@ 25 ml/hr via clysis every shift (day, evening, night)  . traMADol (ULTRAM) 50 MG tablet Take 1 tablet (50 mg total) by mouth every 8 (eight) hours as needed for severe pain. (Patient taking differently: Take 50 mg by mouth See admin instructions. Take one tablet (50 mg) by mouth every 12 hours, may also take one tablet every 8 hours as needed for severe pain)  . feeding supplement, GLUCERNA SHAKE, (GLUCERNA SHAKE) LIQD Take 237 mLs by mouth 3 (three) times daily between meals. (Patient not taking: Reported on 08/30/2017)  . levETIRAcetam (KEPPRA) 750 MG tablet Take 2 tablets (1,500 mg total) by mouth 2 (two) times daily. (Patient not taking: Reported on 08/19/2017)    FAMILY HISTORY:  Her family history includes Diabetes in her mother; Hypertension in her mother.  SOCIAL HISTORY: She  reports that she has never smoked. She has never used smokeless tobacco. She reports that she drinks alcohol. She reports that she does not use drugs.  SUBJECTIVE:  Sedated  VITAL SIGNS: BP 139/61   Pulse 85   Temp 98.4 F (36.9 C) (Axillary)   Resp 19   Ht 5\' 2"  (1.575 m)   Wt 70.4 kg (155 lb 3.3 oz)   SpO2 100%   BMI 28.39 kg/m   HEMODYNAMICS:    VENTILATOR SETTINGS: Vent Mode: PRVC FiO2 (%):  [30 %] 30 % Set Rate:  [18 bmp] 18 bmp Vt Set:  [400 mL] 400 mL PEEP:  [5 cmH20] 5 cmH20 Plateau Pressure:  [11 cmH20-14 cmH20] 11 cmH20  INTAKE / OUTPUT: I/O last 3 completed shifts: In: 7342.1 [I.V.:5056.9; NG/GT:1700; IV Piggyback:585.1] Out: 1200 [Urine:1200]  PHYSICAL EXAMINATION: General:  WD/WN F  sedated Neuro:  Sedated HEENT:  [place]/AT ETT/FT in place Cardiovascular:  RRR, no M/R/G Lungs:  Few rhonchi Abdomen:  Supple, no guarding Musculoskeletal:  No active joints Skin:  Slight pedal edema  LABS:  BMET Recent Labs  Lab 09/06/17 0252 09/07/17 0317 09/08/17 0246  NA 140 140 139  K 3.7 4.0 3.6  CL 109 111 109  CO2 24 21* 25  BUN 11 12 12   CREATININE 0.56 0.46 0.40*  GLUCOSE 107* 137* 152*    Electrolytes Recent Labs  Lab 09/05/17 0455 09/06/17 0252 09/07/17 0317 09/08/17 0246  CALCIUM 9.4 9.1 8.4* 8.0*  MG 2.0  --  1.6* 1.8  PHOS  --   --  4.1  --     CBC Recent Labs  Lab 09/07/17 0317 09/08/17 0246 09/09/17 0302  WBC 8.9 8.7 9.4  HGB 13.1 13.2 11.2*  HCT 41.0 40.1 35.9*  PLT 147* 143* 138*    Coag's Recent Labs  Lab 09/07/17 0317  INR 1.15    Sepsis Markers Recent Labs  Lab 09/05/17 1221  PROCALCITON 0.18  ABG Recent Labs  Lab 09/05/17 1248 09/07/17 0418 09/08/17 0349  PHART 7.325* 7.338* 7.348*  PCO2ART 46.2 41.5 44.8  PO2ART 156.0* 131* 115*    Liver Enzymes Recent Labs  Lab 09/07/17 0317  AST 18  ALT 12  ALKPHOS 61  BILITOT 0.3  ALBUMIN 2.5*    Cardiac Enzymes No results for input(s): TROPONINI, PROBNP in the last 168 hours.  Glucose Recent Labs  Lab 09/08/17 1551 09/08/17 2053 09/09/17 0007 09/09/17 0355 09/09/17 0721 09/09/17 1207  GLUCAP 139* 149* 117* 129* 136* 126*    Imaging No results found.   STUDIES:  EEG recording per Neurol note  CULTURES: Tracheal aspirate 7/31: nl flora BC neg to date  ANTIBIOTICS: Rocephin  LINES/TUBES: ETT Foley  DISCUSSION: 67 year old female with prior MCA stroke, now presents with status epilepticus. Critical care asked to support airway for burst suppression therapy. Plan is to wean off sedation by 15:00 today with family discussion at 16:00 to see what neurological fn is present.  ASSESSMENT / PLAN:  PULMONARY A: No change in pulm status P:    Per neurological assessment later today  CARDIOVASCULAR A:  HTN P:  hydralzine  GASTROINTESTINAL A:   No acute problems P:   Continue TF and prophylaxis for now  INFECTIOUS A:   [place] UTI P:   D/c Abx  NEUROLOGIC A:   As per Discussion P:   As per Discussion above  Critical care time; 30 min  Pulmonary and Critical Care Medicine [facility] Pager: [phone]  09/09/2017, 12:53 PM

## 2017-09-09 NOTE — Progress Notes (Signed)
EEG maint complete. No skin breakdown. Continue to monitor 

## 2017-09-09 NOTE — Progress Notes (Signed)
Midazolam weaned off. EEG reviewed. There is diffuse slowing without electrographic seizure, significantly improved relative to EEG review from 8/2 at which time the patient had had her first trial off IV sedation. Plan is now to keep off Versed sedation, restart fentanyl, continue on her 4 other anticonvulsants and reassess in the morning. Discussed with family who expressed understanding and agreement with plan. They wish to pursue aggressive medical management for now. Palliative on board given possibility that patient will deteriorate again with recrudescence of status epilepticus off propofol/Versed sedation.   Electronically signed: Dr. Kerney Elbe

## 2017-09-09 NOTE — Progress Notes (Signed)
Daily Progress Note   Patient Name: Ebony Williams       Date: 09/09/2017 DOB: 17-May-1950  Age: 67 y.o. MRN#: [id number] Attending Physician: Garner Nash, DO Primary Care Physician: Sharilyn Sites, MD Admit Date: 08/11/2017  Reason for Consultation/Follow-up: Establishing goals of care, Psychosocial/spiritual support and Withdrawal of life-sustaining treatment  Subjective: Pt see, chart reviewed. Pt had versed restarted 09/08/17 and was up titrated to 55 mg/hr. Now being down tirated today with goal for versed to be stopped by 1500. She continues on vimpat, keppra, dilantin and phenobarb  Length of Stay: 8  Current Medications: Scheduled Meds:  . aspirin  81 mg Oral Daily  . atorvastatin  40 mg Oral Daily  . chlorhexidine gluconate (MEDLINE KIT)  15 mL Mouth Rinse BID  . clopidogrel  75 mg Oral Daily  . enoxaparin (LOVENOX) injection  40 mg Subcutaneous Q24H  . feeding supplement (PRO-STAT SUGAR FREE 64)  30 mL Per Tube BID  . feeding supplement (VITAL HIGH PROTEIN)  1,000 mL Per Tube Q24H  . fentaNYL (SUBLIMAZE) injection  50 mcg Intravenous Once  . mouth rinse  15 mL Mouth Rinse 10 times per day  . PHENObarbital  65 mg Intravenous QHS  . phenytoin (DILANTIN) IV  100 mg Intravenous BID  . phenytoin (DILANTIN) IV  50 mg Intravenous Q1200  . sodium chloride flush  3 mL Intravenous Q12H    Continuous Infusions: . sodium chloride Stopped (09/05/17 1400)  . dextrose 5 % and 0.45 % NaCl with KCl 20 mEq/L 75 mL/hr at 09/09/17 1200  . famotidine (PEPCID) IV Stopped (09/08/17 1446)  . fentaNYL infusion INTRAVENOUS 25 mcg/hr (09/09/17 1200)  . lacosamide (VIMPAT) IV Stopped (09/09/17 1007)  . levETIRAcetam Stopped (09/09/17 0741)  . midazolam (VERSED) infusion 30 mg/hr (09/09/17 1200)    . propofol (DIPRIVAN) infusion Stopped (09/07/17 1447)    PRN Meds: acetaminophen (TYLENOL) oral liquid 160 mg/5 mL, bisacodyl, fentaNYL, hydrALAZINE, nystatin  Physical Exam  Constitutional: She appears well-developed and well-nourished.  Acutely ill appearing female; unresponsive, intubated  HENT:  Head: Normocephalic and atraumatic.  Cardiovascular: Normal rate.  Pulmonary/Chest:  intubated  Genitourinary:  Genitourinary Comments: foley  Neurological:  unresponsive  Skin: Skin is warm and dry.  Psychiatric:  Unable to test  Nursing note and vitals reviewed.  Vital Signs: BP 139/61   Pulse 85   Temp 98.4 F (36.9 C) (Axillary)   Resp 19   Ht '5\' 2"'$  (1.575 m)   Wt 70.4 kg (155 lb 3.3 oz)   SpO2 100%   BMI 28.39 kg/m  SpO2: SpO2: 100 % O2 Device: O2 Device: Ventilator O2 Flow Rate:    Intake/output summary:   Intake/Output Summary (Last 24 hours) at 09/09/2017 1443 Last data filed at 09/09/2017 1200 Gross per 24 hour  Intake 3586.18 ml  Output 685 ml  Net 2901.18 ml   LBM: Last BM Date: 09/05/17 Baseline Weight: Weight: 67.6 kg (149 lb) Most recent weight: Weight: 70.4 kg (155 lb 3.3 oz)       Palliative Assessment/Data:    Flowsheet Rows     Most Recent Value  Intake Tab  Referral Department  -- [ED]  Unit at Time of Referral  ER  Palliative Care Primary Diagnosis  Neurology  Date Notified  08/11/2017  Palliative Care Type  Return patient Palliative Care  Reason for referral  Clarify Goals of Care  Date of Admission  08/17/2017  Date first seen by Palliative Care  09/03/17  # of days Palliative referral response time  2 Day(s)  # of days IP prior to Palliative referral  0  Clinical Assessment  Psychosocial & Spiritual Assessment  Palliative Care Outcomes      Patient Active Problem List   Diagnosis Date Noted  . Metabolic encephalopathy 09/73/5329  . UTI due to Klebsiella species 08/10/2017  . Seizure, late effect of stroke ([place])  08/13/2017  . 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 Malnutrition of moderate degree 08/06/2017  . DNR (do not resuscitate) discussion   . Seizure ([place])   . Palliative care by specialist   . Goals of care, counseling/discussion   . Altered mental status 08/01/2017  . Right arm weakness 06/16/2017  . Aphasia 06/16/2017  . Spastic hemiplegia of right dominant side as late effect of cerebral infarction ([place])   . Hyponatremia   . Labile blood pressure   . Hypokalemia   . Elevated BUN   . Lethargy   . Transaminitis   . Essential hypertension   . Hypertensive crisis   . 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 Depression   . Dysphagia, post-stroke   . Diastolic dysfunction   . Benign essential HTN   . Prediabetes   . Leukocytosis   . Cerebral infarction due to occlusion of left middle cerebral artery ([place]) s/p IV tPA 04/06/2017  . Stroke (cerebrum) ([place]) 04/06/2017  . 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 Pain in joint, shoulder region 10/18/2011    Palliative Care Assessment & Plan   Patient Profile: This 67 year old female past medical history of large MCA stroke in March 2019. Since then per the family she has had significant difficulty in her recovery. At her best she is wheelchair-bound with significant right-sided hemiparesis. She was admitted to the hospital on 08/11/2017. Critical care was paged by neurology this morning for ongoing status epilepticus. Since admission she is currently received phenobarbital, Vimpat, Keppra as well as Dilantin. After discussion with neurology as well as the hospitalist the recommendation was for admission to the intensive care unit, intubation and initiation of burst suppression through sedation.  Consult ordered for goals of care. As of 09/07/2017, patient remains intubated. She is now off Versed but remains on 4 anticonvulsants and minimally responsive.  However, during the evening of 09/08/2017 Versed had to be restarted  because  of resumption of seizure activity.  Per palliative medicine provider,A.Parker, NP, patient's family shares that she would not want to remain in this debilitated state where she unable able to communicate or improve  Versed being down titrated, goal to be off by 1500 which has been achieved. Plan per neurology is to see how she does off versed overnight and see how she is tomorrow. Husband verbalizing hope that she can return to "close to where she was in Feb.    Recommendations/Plan: PM to continue to support pt and family goals. Will see how she does overnight off versed  Goals of Care and Additional Recommendations:  Limitations on Scope of Treatment: No Surgical Procedures and No Tracheostomy  Code Status:    Code Status Orders  (From admission, onward)        Start     Ordered   09/05/17 1204  Limited resuscitation (code)  Continuous    Question Answer Comment  In the event of cardiac or respiratory ARREST: Initiate Code Blue, Call Rapid Response No   In the event of cardiac or respiratory ARREST: Perform CPR No   In the event of cardiac or respiratory ARREST: Perform Intubation/Mechanical Ventilation Yes   In the event of cardiac or respiratory ARREST: Use NIPPV/BiPAp only if indicated Yes   In the event of cardiac or respiratory ARREST: Administer ACLS medications if indicated No   In the event of cardiac or respiratory ARREST: Perform Defibrillation or Cardioversion if indicated No   Comments Already intubated      09/05/17 1204    Code Status History    Date Active Date Inactive Code Status Order ID Comments User Context   08/28/2017 1711 09/03/2017 1311 DNR [id number]  Lady Deutscher, MD ED   08/01/2017 2332 08/07/2017 1911 Partial Code [id number]  Oswald Hillock, MD Inpatient   06/16/2017 1622 06/22/2017 1755 Full Code [id number]  Rondel Jumbo, PA-C ED   04/11/2017 1640 05/08/2017 1849 Full Code [id number]  Cathlyn Parsons, PA-C Inpatient   04/11/2017 1640 04/11/2017  1640 Full Code [id number]  Cathlyn Parsons, PA-C Inpatient   04/06/2017 2220 04/11/2017 1634 Full Code [id number]  Kerney Elbe, MD Inpatient    Advance Directive Documentation     Most Recent Value  Type of Advance Directive  Out of facility DNR (pink MOST or yellow form)  Pre-existing out of facility DNR order (yellow form or pink MOST form)  -  "MOST" Form in Place?  -       Prognosis:    Hours - Days if full comfort care, one-way extubation is performed, in the setting of MCA stroke now in status epilepticus     Discharge Planning:   If one-way extubation is performed, would anticipate hospital death versus residential hospice    Care plan was discussed with Dr. Cheral Marker    Thank you for allowing the Palliative Medicine Team to assist in the care of this patient.   Time In: 1600 Time Out: 1625 Total Time 25 min Prolonged Time Billed  no       Greater than 50%  of this time was spent counseling and coordinating care related to the above assessment and plan.  Dory Horn, NP  Please contact Palliative Medicine Team phone at [phone] for questions and concerns.

## 2017-09-09 NOTE — Procedures (Signed)
Electroencephalography report.  Long-term monitoring  Data acquisition: [employer] 10-20 for eligible placement.  18 channels EEG with additional EKG channel  Recording begins 09/08/2017 at 07 30  AM Recording and 08/4 /2019 at 7:30 AM  Number of study day 7  CPT 95951  This intensive EEG monitoring with simultaneous video monitoring to provide this patient with history of stroke and now cognitive and functional decline to rule out clinical and subclinical electrographic seizures  Day 1 : There was one pushbutton activation events however appeared to be an accident.  Background activities marked by disorganized background activity slowing ranging between 5 to 6 cps with me with admixed faster frequencies as well as delta slowing throughout the recording without posterior dominant rhythm.  Superimposed near continues left posterior polyspike and wave discharges present at times was brought negative field and periodic fashion suggestive of left lateralizing periodic  epileptiform discharges.  At times this left posterior polyspike and wave discharges become rhythmic synchronized with evolving features in morphology frequency amplitude and extension of negative field in a right parieto-occipital cortex consistent with electrographic seizures.  During second half of the recording background activities obscured by significant electrode artifact however when there is a time with a relative absence of electrode artifact it appears that patient continue to have electrographic seizures as discussed above.  There was no obvious clinical correlate to this electrographic seizures.  Day 2: Background activities marked by 5 to 6 cps background activities with admixed slower frequencies in the delta range without well-defined posterior dominant rhythm.  Superimposed near continuous left parieto-occipital polyspike and wave discharges present throughout the recording at times negative field extending to the right  parieto-occipital cortex.  In addition frequent electrographic seizures still noted most likely with onset across left parieto-occipital cortex with evolution morphology frequency amplitude and propagation of negative field to the right parieto-occipital region.  There is no obvious clinical accompaniment.  EEG was technically difficult due to  motion artifact  Day 3: Background activities marked by background activity slowing distributed broadly.  Superimposed continues non-remitting 1 cps periodic polyspike and wave discharges with maximum negativity in the left parieto-occipital cortex present throughout the recording.  During second half of the recording this left hemispheric discharges become more blunted and not as well developed suggesting of some degree of improvement however these discharges occur still 1 cps frequency. There is also independent right posterior sharp waves maximum negativity right parieto-occipital cortex present throughout the recording however significantly less developed.  Occasionally electrographic seizures present involving a left parieto-occipital and propagating to the right parietal cortex without obvious clinical accompaniment.  Day 4: Burst suppression pattern was achieved around 12 noon.  Since that time background activities remain marked by burst and suppression pattern with 3-3 ratio.  No seizures noted.  Occasionally within those burst left frontotemporal and independent right frontal and temporal sharp waves and spikes present throughout the recording.  Day 5: Background activities marked by burst suppression pattern throughout the recording ranging between 3-3 and 2-3 ratio.  Left frontotemporal and right frontal sharp waves and spikes occasionally present throughout the recording.  There is no clinical or subclinical seizures.  Day 6: Initial burst suppression pattern evolved into more continues background activities with superimposed to very frequent near  continues multifocal spike and wave and polyspike and wave discharges involving independently left frontal left temporal right frontocentral and right temporal region.  This pattern continued to between 1 PM to approximately 5 PM.  After that background activities marked  by burst and attenuation pattern without frequent interictal epileptiform discharges.  There was no clinical subclinical seizures present.  Day 7: No clinical or subclinical seizures present throughout the recording.  Background activities marked by continuous background activity slowing in the delta and theta range.  Superimposed right frontotemporal slowing present throughout the recording with occasionally poorly formed sharp waves.  No well-defined interictal epileptiform discharges present   Clinical interpretation: This day 7 of intensive EEG monitoring with simultaneous video monitoring did not demonstrate any clinical or  subclinical seizures.  Background activities abnormal for several reasons #1 background activity slowing suggestive of moderate to severe encephalopathy of nonspecific etiologies.  Sedation status may contribute to these findings.  #2 superimposed left frontotemporal slowing suggestive of neuronal dysfunction in that region.  #3 occasionally poorly formed sharp waves and sharply contoured slowing in the left frontotemporal region suggestive of some degree of cortical irritability in that region.  Clinical correlation is advised.

## 2017-09-09 NOTE — Progress Notes (Signed)
Subjective: Intubated and sedated. In burst suppression on EEG.   Objective: Current vital signs: BP (!) 126/56 (BP Location: Left Arm)   Pulse 85   Temp 99.3 F (37.4 C) (Oral)   Resp 18   Ht '5\' 2"'$  (1.575 m)   Wt 70.4 kg (155 lb 3.3 oz)   SpO2 100%   BMI 28.39 kg/m  Vital signs in last 24 hours: Temp:  [98.3 F (36.8 C)-99.3 F (37.4 C)] 99.3 F (37.4 C) (08/04 0800) Pulse Rate:  [82-93] 85 (08/04 0800) Resp:  [14-23] 18 (08/04 0800) BP: (126-184)/(56-82) 126/56 (08/04 0800) SpO2:  [98 %-100 %] 100 % (08/04 0800) FiO2 (%):  [30 %] 30 % (08/04 0342) Weight:  [70.4 kg (155 lb 3.3 oz)] 70.4 kg (155 lb 3.3 oz) (08/04 0357)  Intake/Output from previous day: 08/03 0701 - 08/04 0700 In: 5063.1 [I.V.:3573; NG/GT:1050; IV Piggyback:440.1] Out: 775 [Urine:775] Intake/Output this shift: Total I/O In: 213.6 [I.V.:113.6; IV Piggyback:100] Out: -  Nutritional status:  Diet Order           Diet NPO time specified  Diet effective now          HEENT: [place]/AT Lungs:Intubated, on ventilator Ext: Warm and well perfused Skin: Diffuse edema of face, upper and lower extremities  Neurologic Exam:Sedated on Versed Ment:Sedated with no responses to external stimuli. YY:TKPT flaccidly symmetric. No doll's eye reflex (sedated) Motor/Sensory:Tone decreased all 4 extremities, with some residual increased tonetoRUE and RLE on sedation.    Lab Results: Results for orders placed or performed during the hospital encounter of 08/26/2017 (from the past 48 hour(s))  Glucose, capillary     Status: Abnormal   Collection Time: 09/07/17 11:25 AM  Result Value Ref Range   Glucose-Capillary 128 (H) 70 - 99 mg/dL  Phenytoin level, total     Status: None   Collection Time: 09/07/17  1:06 PM  Result Value Ref Range   Phenytoin Lvl 15.4 10.0 - 20.0 ug/mL    Comment: Performed at [facility] [address] [place], [place] [address]  Glucose, capillary     Status: Abnormal    Collection Time: 09/07/17  3:44 PM  Result Value Ref Range   Glucose-Capillary 163 (H) 70 - 99 mg/dL  Glucose, capillary     Status: Abnormal   Collection Time: 09/07/17  7:14 PM  Result Value Ref Range   Glucose-Capillary 149 (H) 70 - 99 mg/dL  Glucose, capillary     Status: Abnormal   Collection Time: 09/08/17 12:12 AM  Result Value Ref Range   Glucose-Capillary 126 (H) 70 - 99 mg/dL  CBC with Differential/Platelet     Status: Abnormal   Collection Time: 09/08/17  2:46 AM  Result Value Ref Range   WBC 8.7 4.0 - 10.5 K/uL   RBC 4.18 3.87 - 5.11 MIL/uL   Hemoglobin 13.2 12.0 - 15.0 g/dL   HCT 40.1 36.0 - 46.0 %   MCV 95.9 78.0 - 100.0 fL   MCH 31.6 26.0 - 34.0 pg   MCHC 32.9 30.0 - 36.0 g/dL   RDW 13.5 11.5 - 15.5 %   Platelets 143 (L) 150 - 400 K/uL   Neutrophils Relative % 68 %   Neutro Abs 5.8 1.7 - 7.7 K/uL   Lymphocytes Relative 23 %   Lymphs Abs 2.0 0.7 - 4.0 K/uL   Monocytes Relative 8 %   Monocytes Absolute 0.7 0.1 - 1.0 K/uL   Eosinophils Relative 1 %   Eosinophils Absolute  0.1 0.0 - 0.7 K/uL   Basophils Relative 0 %   Basophils Absolute 0.0 0.0 - 0.1 K/uL   Immature Granulocytes 0 %   Abs Immature Granulocytes 0.0 0.0 - 0.1 K/uL    Comment: Performed at [facility] [address] [place], [place] [address]  Basic metabolic panel     Status: Abnormal   Collection Time: 09/08/17  2:46 AM  Result Value Ref Range   Sodium 139 135 - 145 mmol/L   Potassium 3.6 3.5 - 5.1 mmol/L   Chloride 109 98 - 111 mmol/L   CO2 25 22 - 32 mmol/L   Glucose, Bld 152 (H) 70 - 99 mg/dL   BUN 12 8 - 23 mg/dL   Creatinine, Ser 0.40 (L) 0.44 - 1.00 mg/dL   Calcium 8.0 (L) 8.9 - 10.3 mg/dL   GFR calc non Af Amer >60 >60 mL/min   GFR calc Af Amer >60 >60 mL/min    Comment: (NOTE) The eGFR has been calculated using the CKD EPI equation. This calculation has not been validated in all clinical situations. eGFR's persistently <60 mL/min signify possible Chronic Kidney Disease.     Anion gap 5 5 - 15    Comment: Performed at [facility] [address] [place], [place] [address]  Magnesium     Status: None   Collection Time: 09/08/17  2:46 AM  Result Value Ref Range   Magnesium 1.8 1.7 - 2.4 mg/dL    Comment: Performed at [facility] [address] [place], [place] [address]  Blood gas, arterial     Status: Abnormal   Collection Time: 09/08/17  3:49 AM  Result Value Ref Range   FIO2 0.30    Delivery systems VENTILATOR    Mode PRESSURE REGULATED VOLUME CONTROL    VT 400 mL   LHR 18 resp/min   Peep/cpap 5.0 cm H20   pH, Arterial 7.348 (L) 7.350 - 7.450   pCO2 arterial 44.8 32.0 - 48.0 mmHg   pO2, Arterial 115 (H) 83.0 - 108.0 mmHg   Bicarbonate 24.0 20.0 - 28.0 mmol/L   Acid-base deficit 0.9 0.0 - 2.0 mmol/L   O2 Saturation 98.1 %   Patient temperature 98.6    Collection site RIGHT RADIAL    Drawn by [phone]    Sample type ARTERIAL DRAW    Allens test (pass/fail) PASS PASS  Glucose, capillary     Status: Abnormal   Collection Time: 09/08/17  3:50 AM  Result Value Ref Range   Glucose-Capillary 134 (H) 70 - 99 mg/dL  Glucose, capillary     Status: Abnormal   Collection Time: 09/08/17  8:43 AM  Result Value Ref Range   Glucose-Capillary 141 (H) 70 - 99 mg/dL  Triglycerides     Status: None   Collection Time: 09/08/17 10:27 AM  Result Value Ref Range   Triglycerides 112 <150 mg/dL    Comment: Performed at [facility], [address] [place],  [address]  Glucose, capillary     Status: Abnormal   Collection Time: 09/08/17 12:24 PM  Result Value Ref Range   Glucose-Capillary 115 (H) 70 - 99 mg/dL  Glucose, capillary     Status: Abnormal   Collection Time: 09/08/17  3:51 PM  Result Value Ref Range   Glucose-Capillary 139 (H) 70 - 99 mg/dL  Glucose, capillary     Status: Abnormal   Collection Time: 09/08/17  8:53 PM  Result Value Ref  Range   Glucose-Capillary 149 (H) 70 - 99 mg/dL   Comment 1 Notify RN    Comment 2  Document in Chart   Glucose, capillary     Status: Abnormal   Collection Time: 09/09/17 12:07 AM  Result Value Ref Range   Glucose-Capillary 117 (H) 70 - 99 mg/dL  CBC with Differential/Platelet     Status: Abnormal   Collection Time: 09/09/17  3:02 AM  Result Value Ref Range   WBC 9.4 4.0 - 10.5 K/uL   RBC 3.60 (L) 3.87 - 5.11 MIL/uL   Hemoglobin 11.2 (L) 12.0 - 15.0 g/dL   HCT 35.9 (L) 36.0 - 46.0 %   MCV 99.7 78.0 - 100.0 fL   MCH 31.1 26.0 - 34.0 pg   MCHC 31.2 30.0 - 36.0 g/dL   RDW 13.8 11.5 - 15.5 %   Platelets 138 (L) 150 - 400 K/uL   Neutrophils Relative % 60 %   Neutro Abs 5.6 1.7 - 7.7 K/uL   Lymphocytes Relative 25 %   Lymphs Abs 2.3 0.7 - 4.0 K/uL   Monocytes Relative 12 %   Monocytes Absolute 1.1 (H) 0.1 - 1.0 K/uL   Eosinophils Relative 3 %   Eosinophils Absolute 0.3 0.0 - 0.7 K/uL   Basophils Relative 0 %   Basophils Absolute 0.0 0.0 - 0.1 K/uL   Immature Granulocytes 0 %   Abs Immature Granulocytes 0.0 0.0 - 0.1 K/uL    Comment: Performed at [facility], [address] [place], [place] [address]  Glucose, capillary     Status: Abnormal   Collection Time: 09/09/17  3:55 AM  Result Value Ref Range   Glucose-Capillary 129 (H) 70 - 99 mg/dL  Glucose, capillary     Status: Abnormal   Collection Time: 09/09/17  7:21 AM  Result Value Ref Range   Glucose-Capillary 136 (H) 70 - 99 mg/dL    Recent Results (from the past 240 hour(s))  MRSA PCR Screening     Status: None   Collection Time: 08/23/2017 11:27 PM  Result Value Ref Range Status   MRSA by PCR NEGATIVE NEGATIVE Final    Comment:        The GeneXpert MRSA Assay (FDA approved for NASAL specimens only), is one component of a comprehensive MRSA colonization surveillance program. It is not intended to diagnose MRSA infection nor to guide or monitor treatment for MRSA infections. Performed at [facility], [place] [address], [place], [place] [address]   Culture, blood (routine x 2)      Status: None   Collection Time: 09/02/17 12:00 PM  Result Value Ref Range Status   Specimen Description BLOOD LEFT HAND  Final   Special Requests   Final    BOTTLES DRAWN AEROBIC ONLY Blood Culture adequate volume   Culture   Final    NO GROWTH 5 DAYS Performed at [facility], [place] [address] [place], [place] [address]    Report Status 09/07/2017 FINAL  Final  Culture, blood (routine x 2)     Status: None   Collection Time: 09/02/17 12:05 PM  Result Value Ref Range Status   Specimen Description BLOOD RIGHT HAND  Final   Special Requests   Final    BOTTLES DRAWN AEROBIC ONLY Blood Culture adequate volume   Culture   Final    NO GROWTH 5 DAYS Performed at [facility], [place] [address] [place], [place] [address]    Report  Status 09/07/2017 FINAL  Final  MRSA PCR Screening     Status: None   Collection Time: 09/05/17 10:46 AM  Result Value Ref Range Status   MRSA by PCR NEGATIVE NEGATIVE Final    Comment:        The GeneXpert MRSA Assay (FDA approved for NASAL specimens only), is one component of a comprehensive MRSA colonization surveillance program. It is not intended to diagnose MRSA infection nor to guide or monitor treatment for MRSA infections. Performed at [facility], [place] [address], [place], [place] [address]   Culture, Urine     Status: None   Collection Time: 09/05/17 11:27 AM  Result Value Ref Range Status   Specimen Description URINE, CATHETERIZED  Final   Special Requests Immunocompromised  Final   Culture   Final    NO GROWTH Performed at [facility], [place] [address], [place], [place] [address]    Report Status 09/06/2017 FINAL  Final  Culture, respiratory (non-expectorated)     Status: None   Collection Time: 09/05/17 11:51 AM  Result Value Ref Range Status   Specimen Description TRACHEAL ASPIRATE  Final   Special Requests NONE  Final   Gram Stain   Final    MODERATE WBC PRESENT,BOTH PMN AND MONONUCLEAR NO ORGANISMS SEEN     Culture   Final    FEW Consistent with normal respiratory flora. Performed at [facility], [place] [address] [place], [place] [address]    Report Status 09/07/2017 FINAL  Final    Lipid Panel Recent Labs    09/08/17 1027  TRIG 112    Studies/Results: Dg Chest Port 1 View  Result Date: 09/08/2017 CLINICAL DATA:  Aspiration pneumonia EXAM: PORTABLE CHEST 1 VIEW COMPARISON:  09/05/2017 FINDINGS: Endotracheal tube and nasogastric catheter are well seen and stable. Cardiac shadow is within normal limits. Mild left retrocardiac density is noted slightly increased from the prior exam. No other focal infiltrate is noted. No bony abnormality is noted. IMPRESSION: Slight increase in left basilar retrocardiac density. Electronically Signed   By: Inez Catalina M.D.   On: 09/08/2017 08:56    Medications:  Scheduled: . aspirin  81 mg Oral Daily  . atorvastatin  40 mg Oral Daily  . chlorhexidine gluconate (MEDLINE KIT)  15 mL Mouth Rinse BID  . clopidogrel  75 mg Oral Daily  . enoxaparin (LOVENOX) injection  40 mg Subcutaneous Q24H  . feeding supplement (PRO-STAT SUGAR FREE 64)  30 mL Per Tube BID  . feeding supplement (VITAL HIGH PROTEIN)  1,000 mL Per Tube Q24H  . fentaNYL (SUBLIMAZE) injection  50 mcg Intravenous Once  . mouth rinse  15 mL Mouth Rinse 10 times per day  . PHENObarbital  65 mg Intravenous QHS  . phenytoin (DILANTIN) IV  100 mg Intravenous BID  . phenytoin (DILANTIN) IV  50 mg Intravenous Q1200  . sodium chloride flush  3 mL Intravenous Q12H   Continuous: . sodium chloride Stopped (09/05/17 1400)  . dextrose 5 % and 0.45 % NaCl with KCl 20 mEq/L 75 mL/hr at 09/09/17 0800  . famotidine (PEPCID) IV Stopped (09/08/17 1446)  . fentaNYL infusion INTRAVENOUS 25 mcg/hr (09/09/17 0800)  . lacosamide (VIMPAT) IV Stopped (09/08/17 2306)  . levETIRAcetam Stopped (09/09/17 0741)  . midazolam (VERSED) infusion 55 mg/hr (09/09/17 0800)  . propofol (DIPRIVAN) infusion Stopped  (09/07/17 1447)    LTM EEG preliminary review: Burst suppression pattern.   LTM EEG official report:  Day  7: No clinical or subclinical seizures present throughout the recording.  Background activities marked by continuous background activity slowing in the delta and theta range.  Superimposed right frontotemporal slowing present throughout the recording with occasionally poorly formed sharp waves.  No well-defined interictal epileptiform discharges present. Clinical interpretation: This day 7 of intensive EEG monitoring with simultaneous video monitoring did not demonstrate any clinical or  subclinical seizures.  Background activities abnormal for several reasons #1 background activity slowing suggestive of moderate to severe encephalopathy of nonspecific etiologies.  Sedation status may contribute to these findings.  #2 superimposed left frontotemporal slowing suggestive of neuronal dysfunction in that region.  #3 occasionally poorly formed sharp waves and sharply contoured slowing in the left frontotemporal region suggestive of some degree of cortical irritability in that region.     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 Overall presentation is most consistent with ongoing seizure activity since Wednesday7/24, which was refractory to multiple anticonvulsants but resolved with burst suppression on Versed and propofol x 48 hours. Weaning off burst suppression on 8/2 revealed recrudescence of epileptiform discharges manifesting as very frequent near continuous multifocal spike and wave discharges. A second trial of 48 hours' burst suppression has been initiated, this time with Versed as the sole sedating agent to limit risk of propofol-induced complications. Now weaning off Versed today (8/4) to reassess EEG and clinical exam off sedation.  1.Weaning off Versed. Will be completely weaned at 3 PM.   2. Continuingphenobarbital,Vimpat,Keppra and Dilantin.  3. 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  2.ContinueDilantinwith pharmacy to dose. 3. Continuephenobarbital,Vimpat and Keppra 4.Continuous pulse oximetry and cardiac telemetry. 5.Frequent neuro checks. 6. Will reexamine at 4 PM and re-discuss prognosis with family.    40 minutes spent in the neurological evaluation and management of this critically ill patient with status epilepticus. Time included EEG review and family education.     LOS: 8 days   '@Electronically'$  signed: Dr. Kerney Elbe 09/09/2017  9:09 AM

## 2017-09-10 DIAGNOSIS — I63512 Cerebral infarction due to unspecified occlusion or stenosis of left middle cerebral artery: Secondary | ICD-10-CM

## 2017-09-10 DIAGNOSIS — Z8673 Personal history of transient ischemic attack (TIA), and cerebral infarction without residual deficits: Secondary | ICD-10-CM

## 2017-09-10 LAB — GLUCOSE, CAPILLARY
Glucose-Capillary: 134 mg/dL — ABNORMAL HIGH (ref 70–99)
Glucose-Capillary: 137 mg/dL — ABNORMAL HIGH (ref 70–99)
Glucose-Capillary: 149 mg/dL — ABNORMAL HIGH (ref 70–99)
Glucose-Capillary: 149 mg/dL — ABNORMAL HIGH (ref 70–99)
Glucose-Capillary: 151 mg/dL — ABNORMAL HIGH (ref 70–99)
Glucose-Capillary: 152 mg/dL — ABNORMAL HIGH (ref 70–99)
Glucose-Capillary: 159 mg/dL — ABNORMAL HIGH (ref 70–99)

## 2017-09-10 LAB — CBC WITH DIFFERENTIAL/PLATELET
ABS IMMATURE GRANULOCYTES: 0 10*3/uL (ref 0.0–0.1)
BASOS ABS: 0 10*3/uL (ref 0.0–0.1)
BASOS PCT: 1 %
EOS PCT: 3 %
Eosinophils Absolute: 0.3 10*3/uL (ref 0.0–0.7)
HCT: 34.2 % — ABNORMAL LOW (ref 36.0–46.0)
HEMOGLOBIN: 10.7 g/dL — AB (ref 12.0–15.0)
Immature Granulocytes: 0 %
LYMPHS PCT: 27 %
Lymphs Abs: 2.1 10*3/uL (ref 0.7–4.0)
MCH: 30.9 pg (ref 26.0–34.0)
MCHC: 31.3 g/dL (ref 30.0–36.0)
MCV: 98.8 fL (ref 78.0–100.0)
MONO ABS: 0.8 10*3/uL (ref 0.1–1.0)
Monocytes Relative: 11 %
NEUTROS ABS: 4.3 10*3/uL (ref 1.7–7.7)
Neutrophils Relative %: 58 %
Platelets: 155 10*3/uL (ref 150–400)
RBC: 3.46 MIL/uL — AB (ref 3.87–5.11)
RDW: 13.5 % (ref 11.5–15.5)
WBC: 7.5 10*3/uL (ref 4.0–10.5)

## 2017-09-10 LAB — PHENYTOIN LEVEL, TOTAL: Phenytoin Lvl: 9.5 ug/mL — ABNORMAL LOW (ref 10.0–20.0)

## 2017-09-10 LAB — PHENOBARBITAL LEVEL: Phenobarbital: 8.4 ug/mL — ABNORMAL LOW (ref 15.0–30.0)

## 2017-09-10 MED ORDER — FUROSEMIDE 10 MG/ML IJ SOLN
20.0000 mg | Freq: Every day | INTRAMUSCULAR | Status: DC
  Administered 2017-09-10 – 2017-09-11 (×2): 20 mg via INTRAVENOUS
  Filled 2017-09-10 (×2): qty 2

## 2017-09-10 MED ORDER — PHENOBARBITAL SODIUM 65 MG/ML IJ SOLN
130.0000 mg | Freq: Once | INTRAMUSCULAR | Status: AC
  Administered 2017-09-10: 130 mg via INTRAVENOUS
  Filled 2017-09-10: qty 2

## 2017-09-10 MED ORDER — SODIUM CHLORIDE 0.9 % IV SOLN
5.0000 mg/kg | Freq: Once | INTRAVENOUS | Status: AC
  Administered 2017-09-10: 359.5 mg via INTRAVENOUS
  Filled 2017-09-10: qty 7.19

## 2017-09-10 MED ORDER — FENTANYL CITRATE (PF) 100 MCG/2ML IJ SOLN
50.0000 ug | INTRAMUSCULAR | Status: DC | PRN
Start: 2017-09-10 — End: 2017-09-11
  Administered 2017-09-10 – 2017-09-11 (×4): 50 ug via INTRAVENOUS
  Filled 2017-09-10 (×4): qty 2

## 2017-09-10 NOTE — Progress Notes (Signed)
vLTM EEG maint complete. No skin breakdown. Continue to monitor

## 2017-09-10 NOTE — Progress Notes (Signed)
Subjective: No significant changes.   Exam: Vitals:   09/10/17 0800 09/10/17 0839  BP:  139/60  Pulse:    Resp:  20  Temp: 97.9 F (36.6 C)   SpO2:  100%   Gen: In bed, NAD Resp: non-labored breathing, no acute distress Abd: soft, nt  Neuro: MS: opens eyes to noxious stimulation,  CN: PERRL, EOMI Motor: she extends to noxious stimuli,  Sensory: responds to nox stim x 4.   Pertinent Labs: HgB 10.7  Impression: 67 yo F presenting with partial status epilepticus 2/2 previous stroke. She was likely in status epilepticus for 4 days prior to arrival. She did not respond to conventional AEDs and therefore burst suppression was pursued given the severity of the aphasia. She has been off of sedation since yesterday without seizure recurrence.   Recommendations: 1) PHT, PHB levels 2) continue dilantin, phenobarb, keppra 3) conitnue LTM as there does seem to be some irritability   This patient is critically ill and at significant risk of neurological worsening, death and care requires constant monitoring of vital signs, hemodynamics,respiratory and cardiac monitoring, neurological assessment, discussion with family, other specialists and medical decision making of high complexity. I spent 40 minutes of neurocritical care time  in the care of  this patient.  Roland Rack, MD [facility] [phone]  If 7pm- 7am, please page neurology on call as listed in [place]. 09/10/2017  10:13 AM

## 2017-09-10 NOTE — Progress Notes (Signed)
PULMONARY / CRITICAL CARE MEDICINE   REASON FOR ADMISSION:  Status epilepticus presenting as altered mental status.  SUBJECTIVE/OVERNIGHT EVENTS:   Seizure control achieved yesterday and midazolam infusion stopped, fentanyl and anticonvulsants continued.  No clinical seizures but patient remains unresponsive. Family confused regarding directions of care as relayed to them by Neurology over the weekend.   SYNOPSIS OF PRESENT ILLNESS:   Ebony Williams is a 67 y.o. female who presented on 08/16/2017 with altered mental status from SNF.  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 Began having seizures soon after and has had repeated admissions for seizure, initially GTC and UTI.   Started treatment of UTI again 08/24/17 when presented with altered mentation. Presented with unresponsiveness and episodes of teeth clenching. EEG showed seizure and patient was intubated to facilitate control of partial complex status epilepticus.  Patient has remained unresponsive then with recurrence of seizure upon weaning of sedation.    he appears to have achieved control 09/09/17, midazolam discontinued.   OBJECTIVE EXAMINATION:  Vitals:   09/10/17 1200 09/10/17 1300  BP: (!) 186/79 (!) 172/71  Pulse: 95 92  Resp: 17 17  Temp: 99 F (37.2 C)   SpO2: 100% 99%   Vent Mode: PRVC FiO2 (%):  [30 %] 30 % Set Rate:  [18 bmp] 18 bmp Vt Set:  [400 mL] 400 mL PEEP:  [5 cmH20] 5 cmH20 Plateau Pressure:  [12 cmH20-16 cmH20] 12 cmH20   Physical Exam  Constitutional: She appears well-developed and well-nourished. No distress.  HENT:  7.5 endotracheal tube in place.   Eyes:  Swollen conjunctivae  Neck: No JVD present. No tracheal deviation present.  Cardiovascular: Normal rate, normal heart sounds and intact distal pulses.  Pulmonary/Chest: Breath sounds normal. No respiratory distress. She has no  wheezes. She has no rales.  No ventilator asynchrony  Abdominal: Soft. Bowel sounds are normal.  OGT with feeds infusing  Musculoskeletal: She exhibits edema (generalized edema).  Neurological:  Obtunded. Not following commands.  Poor response to painful stimuli.  Skin: Skin is warm and dry. Capillary refill takes less than 2 seconds.  Vitals reviewed.   PROBLEM LIST:  Acute encephalopathy due status epilepticus in context of recent stroke.   Have achieved clinical seizure control on Keppra, Vimpat, Dilantin, and Phenobarb.  EEG today and personally reviewed: Clinical interpretation: This day 8 of intensive EEG monitoring with simultaneous video monitoring did not demonstrate any clinical or  subclinical seizures.  Background activities abnormal for several reasons #1 background activity slowing suggestive of moderate to severe encephalopathy of nonspecific etiologies.  Sedation status may contribute to these findings.  #2 superimposed left frontotemporal slowing suggestive of neuronal dysfunction in that region.  #3 occasionally poorly formed sharp waves and sharply contoured slowing in the left temporal region suggestive of some degree of cortical irritability.  Phenobarbital 15.0 - 30.0 ug/mL 8.4Low     Phenytoin Lvl 10.0 - 20.0 ug/mL 9.5Low   15.4 CM 22.2High    Assessment:  Current mental status likely related to large doses of midazolam used and post-ictal state. Thus may require several days to clear and see if seizures recur and at what pace patient wakes up.  Have clarified goals of care with family who wish to see if patient can return to pre-admission functional status. We will continue current support to allow patient time to wake up and will reassess goals of care  accordingly.  Have agreed that recurrent seizures would indicate poor prognosis and would prompt earlier transition to comfort care.  Fentanyl infusion was stopped.  Intermittent fentanyl as needed for pain. Follow  AED levels and hold on adjustment as not yet at steady state.  Acute respiratory failure due inability to protect airway:   On nominal ventilator settings and tolerated brief SBT.   Assessment: Mental status precludes extubation. Patient's family would not wish to pursue tracheostomy.  Anasarca:    Generalized edema from immobility and fluid administration.  I/O last 3 completed shifts: In: 6031.4 [P.O.:1; I.V.:3845.1; NG/GT:1700; IV Piggyback:485.2] Out: 2160 [Urine:2160] Total I/O In: 866.1 [I.V.:680.1; IV Piggyback:186] Out: -   + 18L since admission.  BMET    Component Value Date/Time   NA 139 09/08/2017 0246   K 3.6 09/08/2017 0246   CL 109 09/08/2017 0246   CO2 25 09/08/2017 0246   GLUCOSE 152 (H) 09/08/2017 0246   BUN 12 09/08/2017 0246   CREATININE 0.40 (L) 09/08/2017 0246   CALCIUM 8.0 (L) 09/08/2017 0246   GFRNONAA >60 09/08/2017 0246   GFRAA >60 09/08/2017 0246   Assessment: initiate diuresis. Monitor electrolytes.  QUALITY OF CARE MEASURES:  VAP bundle - in place. DVT prophylaxis enoxaparin 40mg . Enteral nutrition moderate risk. At goal rate of 84ml/h Stress ulcer prophylaxis famotidine 20mg  bid. Can switch to oral. Sedation reduction off all continuous sedation. Early mobility not appropriate due to mental status. Vascular secondary prevention atorvastatin 40/d, ASA 81mg , Plavix 75mg /d Glycemic control  Adequate control with no insulin coverage. No history of DM. Lines and drains  Foley required to monitor urine output during active diuresis. Antibiotic de-escalation on no antibiotics. Sepsis care bundle n/a.  CRITICAL CARE Performed by: Kipp Brood   Total critical care time: 40 minutes  Critical care time was exclusive of separately billable procedures and treating other patients.  Critical care was necessary to treat or prevent imminent or life-threatening deterioration.  Critical care was time spent personally by me on the following  activities: development of treatment plan with patient and/or surrogate as well as nursing, discussions with consultants, evaluation of patient's response to treatment, examination of patient, obtaining history from patient or surrogate, ordering and performing treatments and interventions, ordering and review of laboratory studies, ordering and review of radiographic studies, pulse oximetry and re-evaluation of patient's condition.  Kipp Brood, MD [facility] ICU Physician [place]  Pager: [phone] Mobile: [phone] After hours: [phone].

## 2017-09-10 NOTE — Procedures (Signed)
Electroencephalography report.  Long-term monitoring  Data acquisition: International 10-20 for eligible placement.  18 channels EEG with additional EKG channel  Recording begins 09/09/2017 at 07 30  AM Recording and 08/5 /2019 at 7:30 AM  Number of study day 8  CPT 95951  This intensive EEG monitoring with simultaneous video monitoring to provide this patient with history of stroke and now cognitive and functional decline to rule out clinical and subclinical electrographic seizures  Day 1 : There was one pushbutton activation events however appeared to be an accident.  Background activities marked by disorganized background activity slowing ranging between 5 to 6 cps with me with admixed faster frequencies as well as delta slowing throughout the recording without posterior dominant rhythm.  Superimposed near continues left posterior polyspike and wave discharges present at times was brought negative field and periodic fashion suggestive of left lateralizing periodic  epileptiform discharges.  At times this left posterior polyspike and wave discharges become rhythmic synchronized with evolving features in morphology frequency amplitude and extension of negative field in a right parieto-occipital cortex consistent with electrographic seizures.  During second half of the recording background activities obscured by significant electrode artifact however when there is a time with a relative absence of electrode artifact it appears that patient continue to have electrographic seizures as discussed above.  There was no obvious clinical correlate to this electrographic seizures.  Day 2: Background activities marked by 5 to 6 cps background activities with admixed slower frequencies in the delta range without well-defined posterior dominant rhythm.  Superimposed near continuous left parieto-occipital polyspike and wave discharges present throughout the recording at times negative field extending to the right  parieto-occipital cortex.  In addition frequent electrographic seizures still noted most likely with onset across left parieto-occipital cortex with evolution morphology frequency amplitude and propagation of negative field to the right parieto-occipital region.  There is no obvious clinical accompaniment.  EEG was technically difficult due to  motion artifact  Day 3: Background activities marked by background activity slowing distributed broadly.  Superimposed continues non-remitting 1 cps periodic polyspike and wave discharges with maximum negativity in the left parieto-occipital cortex present throughout the recording.  During second half of the recording this left hemispheric discharges become more blunted and not as well developed suggesting of some degree of improvement however these discharges occur still 1 cps frequency. There is also independent right posterior sharp waves maximum negativity right parieto-occipital cortex present throughout the recording however significantly less developed.  Occasionally electrographic seizures present involving a left parieto-occipital and propagating to the right parietal cortex without obvious clinical accompaniment.  Day 4: Burst suppression pattern was achieved around 12 noon.  Since that time background activities remain marked by burst and suppression pattern with 3-3 ratio.  No seizures noted.  Occasionally within those burst left frontotemporal and independent right frontal and temporal sharp waves and spikes present throughout the recording.  Day 5: Background activities marked by burst suppression pattern throughout the recording ranging between 3-3 and 2-3 ratio.  Left frontotemporal and right frontal sharp waves and spikes occasionally present throughout the recording.  There is no clinical or subclinical seizures.  Day 6: Initial burst suppression pattern evolved into more continues background activities with superimposed to very frequent near  continues multifocal spike and wave and polyspike and wave discharges involving independently left frontal left temporal right frontocentral and right temporal region.  This pattern continued to between 1 PM to approximately 5 PM.  After that background activities marked  by burst and attenuation pattern without frequent interictal epileptiform discharges.  There was no clinical subclinical seizures present.  Day 7: No clinical or subclinical seizures present throughout the recording.  Background activities marked by continuous background activity slowing in the delta and theta range.  Superimposed right frontotemporal slowing present throughout the recording with occasionally poorly formed sharp waves.  No well-defined interictal epileptiform discharges present  Day 8: No clinical or subclinical seizures present.  Background activity slowing again redemonstrated throughout the recording.  Poorly formed sharply contoured slowing present and poorly formed sharp waves at the posterior temporal/left parietal region   Clinical interpretation: This day 8 of intensive EEG monitoring with simultaneous video monitoring did not demonstrate any clinical or  subclinical seizures.  Background activities abnormal for several reasons #1 background activity slowing suggestive of moderate to severe encephalopathy of nonspecific etiologies.  Sedation status may contribute to these findings.  #2 superimposed left frontotemporal slowing suggestive of neuronal dysfunction in that region.  #3 occasionally poorly formed sharp waves and sharply contoured slowing in the left temporal region suggestive of some degree of cortical irritability Clinical correlation is advised.

## 2017-09-10 NOTE — Progress Notes (Signed)
Daily Progress Note   Patient Name: Ebony Williams       Date: 09/10/2017 DOB: [birth year]  Age: 67 y.o. MRN#: [id number] Attending Physician: Garner Nash, DO Primary Care Physician: Sharilyn Sites, MD Admit Date: 08/28/2017  Reason for Consultation/Follow-up: Establishing goals of care, Psychosocial/spiritual support and Withdrawal of life-sustaining treatment  Subjective:  Patient intubated. Fentanyl recently stopped to attempt wake-up assessment and evaluate neuro status off sedation.   Daughter, Morey Hummingbird, at bedside along with patient's husband, Reece Levy. Morey Hummingbird is appreciative of neurologist and critical care doctors that spoke with her this morning. She tells me she feels much better knowing the plan to stop sedation and monitor neuro status and seizure activity in the next 24-48 hours. Then have another conversation dependent on how her mother is doing. She speaks of wanting "peace" for her mother regardless of what happens. Morey Hummingbird speaks of all that her mother has been through since stroke in March. Morey Hummingbird shares pictures and videos of her two daughters spending time with Ms. [place]. Therapeutic listening and emotional support provided. Morey Hummingbird has PMT contact information and understandings our team will be following daily for support.   Length of Stay: 9  Current Medications: Scheduled Meds:  . aspirin  81 mg Oral Daily  . atorvastatin  40 mg Oral Daily  . chlorhexidine gluconate (MEDLINE KIT)  15 mL Mouth Rinse BID  . clopidogrel  75 mg Oral Daily  . enoxaparin (LOVENOX) injection  40 mg Subcutaneous Q24H  . feeding supplement (PRO-STAT SUGAR FREE 64)  30 mL Per Tube BID  . feeding supplement (VITAL HIGH PROTEIN)  1,000 mL Per Tube Q24H  . fentaNYL (SUBLIMAZE) injection  50 mcg  Intravenous Once  . mouth rinse  15 mL Mouth Rinse 10 times per day  . PHENObarbital  65 mg Intravenous QHS  . phenytoin (DILANTIN) IV  100 mg Intravenous BID  . phenytoin (DILANTIN) IV  50 mg Intravenous Q1200  . sodium chloride flush  3 mL Intravenous Q12H    Continuous Infusions: . sodium chloride 10 mL/hr at 09/10/17 0700  . dextrose 5 % and 0.45 % NaCl with KCl 20 mEq/L 75 mL/hr at 09/10/17 0845  . famotidine (PEPCID) IV Stopped (09/09/17 1648)  . fentaNYL infusion INTRAVENOUS 75 mcg/hr (09/10/17 0700)  . lacosamide (VIMPAT) IV 200 mg (09/10/17  1003)  . levETIRAcetam 1,500 mg (09/10/17 0705)  . midazolam (VERSED) infusion Stopped (09/09/17 1500)  . propofol (DIPRIVAN) infusion Stopped (09/07/17 1447)    PRN Meds: acetaminophen (TYLENOL) oral liquid 160 mg/5 mL, bisacodyl, fentaNYL, hydrALAZINE, nystatin  Physical Exam  Constitutional: She appears well-developed and well-nourished. She is sedated and intubated.  Acutely ill appearing female. Sedation recently stopped.  HENT:  Head: Normocephalic and atraumatic.  Cardiovascular: Normal rate.  Pulmonary/Chest: No accessory muscle usage. No tachypnea. She is intubated. No respiratory distress.  intubated  Genitourinary:  Genitourinary Comments: foley  Neurological:  Responds to noxious stimuli  Skin: Skin is warm and dry. There is pallor.  Psychiatric:  Unable to test  Nursing note and vitals reviewed.          Vital Signs: BP 139/60   Pulse 83   Temp 97.9 F (36.6 C) (Axillary)   Resp 20   Ht '5\' 2"'$  (1.575 m)   Wt 71.9 kg (158 lb 8.2 oz)   SpO2 100%   BMI 28.99 kg/m  SpO2: SpO2: 100 % O2 Device: O2 Device: Ventilator O2 Flow Rate:    Intake/output summary:   Intake/Output Summary (Last 24 hours) at 09/10/2017 1007 Last data filed at 09/10/2017 0700 Gross per 24 hour  Intake 3310.24 ml  Output 1675 ml  Net 1635.24 ml   LBM: Last BM Date: 09/05/17 Baseline Weight: Weight: 67.6 kg (149 lb) Most recent weight:  Weight: 71.9 kg (158 lb 8.2 oz)       Palliative Assessment/Data: PPS 30%    Flowsheet Rows     Most Recent Value  Intake Tab  Referral Department  -- [ED]  Unit at Time of Referral  ER  Palliative Care Primary Diagnosis  Neurology  Date Notified  08/18/2017  Palliative Care Type  Return patient Palliative Care  Reason for referral  Clarify Goals of Care  Date of Admission  08/10/2017  Date first seen by Palliative Care  09/03/17  # of days Palliative referral response time  2 Day(s)  # of days IP prior to Palliative referral  0  Clinical Assessment  Psychosocial & Spiritual Assessment  Palliative Care Outcomes      Patient Active Problem List   Diagnosis Date Noted  . Metabolic encephalopathy 58/85/0277  . UTI due to Klebsiella species 08/31/2017  . Seizure, late effect of stroke ([place]) 08/21/2017  . 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 Malnutrition of moderate degree 08/06/2017  . DNR (do not resuscitate) discussion   . Seizure ([place])   . Palliative care by specialist   . Goals of care, counseling/discussion   . Altered mental status 08/01/2017  . Right arm weakness 06/16/2017  . Aphasia 06/16/2017  . Spastic hemiplegia of right dominant side as late effect of cerebral infarction ([place])   . Hyponatremia   . Labile blood pressure   . Hypokalemia   . Elevated BUN   . Lethargy   . Transaminitis   . Essential hypertension   . Hypertensive crisis   . 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 Depression   . Dysphagia, post-stroke   . Diastolic dysfunction   . Benign essential HTN   . Prediabetes   . Leukocytosis   . 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 Stroke (cerebrum) ([place]) 04/06/2017  . 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 Pain in joint, shoulder region 10/18/2011    Palliative Care Assessment & Plan  Patient Profile: This 67 year old female past medical history of large MCA stroke in March  2019. Since then per the family she has had significant difficulty in her recovery. At her best she is wheelchair-bound with significant right-sided hemiparesis. She was admitted to the hospital on 08/13/2017. Critical care was paged by neurology this morning for ongoing status epilepticus. Since admission she is currently received phenobarbital, Vimpat, Keppra as well as Dilantin. After discussion with neurology as well as the hospitalist the recommendation was for admission to the intensive care unit, intubation and initiation of burst suppression through sedation.  Consult ordered for goals of care. As of 09/07/2017, patient remains intubated. She is now off Versed but remains on 4 anticonvulsants and minimally responsive.  However, during the evening of 09/08/2017 Versed had to be restarted because of resumption of seizure activity.  Per palliative medicine provider,A.Parker, NP, patient's family shares that she would not want to remain in this debilitated state where she unable able to communicate or improve  Versed stopped 8/4 at 1500. Fentanyl stopped this AM to attempt wake-up assessment. Ongoing continuous EEG.   Recommendations/Plan:  Watchful waiting. Plan per PCCM and neurology. Versed stopped yesterday and fentanyl stopped this morning. Continue to monitor for seizure activity, cognitive status, and ability to liberate from ventilator.    PMT will continue to support patient/family through hospitalization and assist with transition to comfort focused care if appropriate. Daughter has verbalized to PMT provider that the patient would NOT wish to remain in debilitated state with poor quality of life.   Goals of Care and Additional Recommendations:  Limitations on Scope of Treatment: No Surgical Procedures and No Tracheostomy  Code Status:    Code Status Orders  (From admission, onward)        Start     Ordered   09/05/17 1204  Limited resuscitation (code)  Continuous     Question Answer Comment  In the event of cardiac or respiratory ARREST: Initiate Code Blue, Call Rapid Response No   In the event of cardiac or respiratory ARREST: Perform CPR No   In the event of cardiac or respiratory ARREST: Perform Intubation/Mechanical Ventilation Yes   In the event of cardiac or respiratory ARREST: Use NIPPV/BiPAp only if indicated Yes   In the event of cardiac or respiratory ARREST: Administer ACLS medications if indicated No   In the event of cardiac or respiratory ARREST: Perform Defibrillation or Cardioversion if indicated No   Comments Already intubated      09/05/17 1204    Code Status History    Date Active Date Inactive Code Status Order ID Comments User Context   09/04/2017 1711 09/03/2017 1311 DNR [id number]  Lady Deutscher, MD ED   08/01/2017 2332 08/07/2017 1911 Partial Code [id number]  Oswald Hillock, MD Inpatient   06/16/2017 1622 06/22/2017 1755 Full Code [id number]  Rondel Jumbo, PA-C ED   04/11/2017 1640 05/08/2017 1849 Full Code [id number]  Cathlyn Parsons, PA-C Inpatient   04/11/2017 1640 04/11/2017 1640 Full Code [id number]  Cathlyn Parsons, PA-C Inpatient   04/06/2017 2220 04/11/2017 1634 Full Code [id number]  Kerney Elbe, MD Inpatient    Advance Directive Documentation     Most Recent Value  Type of Advance Directive  Out of facility DNR (pink MOST or yellow form)  Pre-existing out of facility DNR order (yellow form or pink MOST form)  -  "MOST" Form in Place?  -       Prognosis:    Guarded  prognosis   Discharge Planning:   TBD  Care plan was discussed with RN, family at bedside   Thank you for allowing the Palliative Medicine Team to assist in the care of this patient.   Time In: 0930 Time Out: 1005 Total Time 65mn Prolonged Time Billed  no       Greater than 50%  of this time was spent counseling and coordinating care related to the above assessment and plan.  MIhor Dow FNP-C Palliative Medicine Team  Phone:  [phone]: [phone] Please contact Palliative Medicine Team phone at [phone] questions and concerns.

## 2017-09-11 DIAGNOSIS — Z515 Encounter for palliative care: Secondary | ICD-10-CM

## 2017-09-11 LAB — PHENOBARBITAL LEVEL: Phenobarbital: 10 ug/mL — ABNORMAL LOW (ref 15.0–30.0)

## 2017-09-11 LAB — GLUCOSE, CAPILLARY
Glucose-Capillary: 152 mg/dL — ABNORMAL HIGH (ref 70–99)
Glucose-Capillary: 160 mg/dL — ABNORMAL HIGH (ref 70–99)

## 2017-09-11 LAB — PHENYTOIN LEVEL, TOTAL: Phenytoin Lvl: 5.6 ug/mL — ABNORMAL LOW (ref 10.0–20.0)

## 2017-09-11 LAB — TRIGLYCERIDES: TRIGLYCERIDES: 149 mg/dL (ref <150)

## 2017-09-11 MED ORDER — SODIUM CHLORIDE 0.9 % IV SOLN
1.0000 mg/h | INTRAVENOUS | Status: DC
  Administered 2017-09-11: 5 mg/h via INTRAVENOUS
  Administered 2017-09-11: 2 mg/h via INTRAVENOUS
  Administered 2017-09-12: 8 mg/h via INTRAVENOUS
  Filled 2017-09-11 (×3): qty 10

## 2017-09-11 MED ORDER — LORAZEPAM 2 MG/ML IJ SOLN
INTRAMUSCULAR | Status: AC
  Administered 2017-09-11: 2 mg
  Filled 2017-09-11: qty 1

## 2017-09-11 MED ORDER — SCOPOLAMINE 1 MG/3DAYS TD PT72
1.0000 | MEDICATED_PATCH | TRANSDERMAL | Status: DC
  Administered 2017-09-11: 1.5 mg via TRANSDERMAL
  Filled 2017-09-11 (×2): qty 1

## 2017-09-11 MED ORDER — FENTANYL 2500MCG IN NS 250ML (10MCG/ML) PREMIX INFUSION
100.0000 ug/h | INTRAVENOUS | Status: DC
  Administered 2017-09-11 – 2017-09-12 (×2): 100 ug/h via INTRAVENOUS
  Filled 2017-09-11 (×2): qty 250

## 2017-09-11 MED ORDER — PHENYTOIN SODIUM 50 MG/ML IJ SOLN
100.0000 mg | Freq: Three times a day (TID) | INTRAMUSCULAR | Status: DC
  Administered 2017-09-11 (×2): 100 mg via INTRAVENOUS
  Filled 2017-09-11 (×2): qty 2

## 2017-09-11 MED ORDER — SODIUM CHLORIDE 0.9 % IV SOLN
500.0000 mg | Freq: Once | INTRAVENOUS | Status: AC
  Administered 2017-09-11: 500 mg via INTRAVENOUS
  Filled 2017-09-11: qty 10

## 2017-09-11 MED ORDER — MIDAZOLAM BOLUS VIA INFUSION (WITHDRAWAL LIFE SUSTAINING TX)
5.0000 mg | INTRAVENOUS | Status: DC | PRN
  Administered 2017-09-11: 5 mg via INTRAVENOUS
  Administered 2017-09-11: 2 mg via INTRAVENOUS
  Filled 2017-09-11: qty 20

## 2017-09-11 MED ORDER — GLYCOPYRROLATE 0.2 MG/ML IJ SOLN
0.2000 mg | INTRAMUSCULAR | Status: DC | PRN
  Administered 2017-09-11 – 2017-09-12 (×5): 0.2 mg via INTRAVENOUS
  Filled 2017-09-11 (×5): qty 1

## 2017-09-11 MED ORDER — PHENOBARBITAL SODIUM 65 MG/ML IJ SOLN
130.0000 mg | Freq: Once | INTRAMUSCULAR | Status: AC
  Administered 2017-09-11: 130 mg via INTRAVENOUS
  Filled 2017-09-11: qty 2

## 2017-09-11 MED ORDER — FENTANYL BOLUS VIA INFUSION
50.0000 ug | INTRAVENOUS | Status: DC | PRN
  Administered 2017-09-11 (×4): 100 ug via INTRAVENOUS
  Filled 2017-09-11: qty 200

## 2017-09-11 NOTE — Progress Notes (Signed)
LTM EEG checked, no skin breakdown noted at Fp1 and Fp2 

## 2017-09-11 NOTE — Progress Notes (Signed)
PULMONARY / CRITICAL CARE MEDICINE   REASON FOR ADMISSION:  Status epilepticus presenting as altered mental status.  SUBJECTIVE/OVERNIGHT EVENTS:   Seizure control achieved yesterday and midazolam infusion stopped, fentanyl and anticonvulsants continued.  Increased irritability on EEG - loaded with fosphenytoin by Neurology last pm.  Patient's family is becoming increasingly convinced that we are going beyond the patient's wishes: she was already very unhappy with her premorbid state of health and was adamant about not living out her life in a nursing home.  SYNOPSIS OF PRESENT ILLNESS:   Ebony Williams is a 67 y.o. female who presented on 08/22/2017 with altered mental status from SNF.  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 Began having seizures soon after and has had repeated admissions for seizure, initially GTC and UTI.   Started treatment of UTI again 08/24/17 when presented with altered mentation. Presented with unresponsiveness and episodes of teeth clenching. EEG showed seizure and patient was intubated to facilitate control of partial complex status epilepticus.  Patient has remained unresponsive then with recurrence of seizure upon weaning of sedation.    She appears to have achieved control 09/09/17, midazolam discontinued.  OBJECTIVE EXAMINATION:  Vitals:   09/11/17 0628 09/11/17 0700  BP: (!) 172/75 (!) 170/76  Pulse: 94 96  Resp: 19 20  Temp:    SpO2: 97% 98%   Vent Mode: PRVC FiO2 (%):  [30 %] 30 % Set Rate:  [18 bmp] 18 bmp Vt Set:  [400 mL] 400 mL PEEP:  [5 cmH20] 5 cmH20 Plateau Pressure:  [9 cmH20-14 cmH20] 9 cmH20   Physical Exam  Constitutional: She appears well-developed and well-nourished. No distress.  HENT:  7.5 endotracheal tube in place.   Eyes:  Swollen conjunctivae  Neck: No JVD present. No tracheal deviation present.  Cardiovascular:  Normal rate, normal heart sounds and intact distal pulses.  Pulmonary/Chest: Breath sounds normal. No respiratory distress. She has no wheezes. She has no rales.  No ventilator asynchrony. Tolerating pressure support ventilation.  Abdominal: Soft. Bowel sounds are normal.  OGT with feeds infusing  Musculoskeletal: She exhibits edema (generalized edema).  Neurological: She is unresponsive. GCS eye subscore is 1. GCS verbal subscore is 1. GCS motor subscore is 2.  Obtunded. Not following commands.  Bilateral extensor response to painful stimuli.  Upgoing plantar responses.  Rhythmic eye flickering with vertical eye bobbing and pupillary hippus.  Skin: Skin is warm and dry. Capillary refill takes less than 2 seconds.  Vitals reviewed.   PROBLEM LIST:  Acute encephalopathy due status epilepticus in context of recent stroke.   Have achieved clinical seizure control on Keppra, Vimpat, Dilantin, and Phenobarb.  EEG today personally reviewed: continued left sided irritability not improved with administration of lorazepam.  Results for GERALDINE, SANDBERG (MRN [id number]) as of 09/11/2017 08:30  Ref. Range 09/10/2017 20:01 09/10/2017 23:41 09/11/2017 02:59 09/11/2017 03:33 09/11/2017 08:17  PHENOBARBITAL Latest Ref Range: 15.0 - 30.0 ug/mL   10.0 (L)    Phenytoin Lvl Latest Ref Range: 10.0 - 20.0 ug/mL   5.6 (L)      Assessment:  Current mental status likely related to large doses of midazolam used and post-ictal state.  Ongoing irritability and possible ongoing partial seizure activity as evidenced by eye movements portend a poor prognosis.  The patient's family is satisfied that we have tried all reasonable efforts to get her back to her premorbid baseline  but is reasonably concerned that her ultimate outcome will be a quality of life that she would not wish for.  Given the prolonged seizures and increasingly prolonged critical illness they are concerned are justified as her outcome is likely to be poor and she  is likely to be confined to a nursing home.  The patient's family will convene and we will plan for an extubation to comfort care.  We will continue the antiepileptic medications to prevent uncomfortable and distressing recurrence of seizures.  Acute respiratory failure due inability to protect airway:   On nominal ventilator settings and tolerating PSV.   Assessment: Will likely tolerate extubation at least for some time.   Anasarca:    Generalized edema from immobility and fluid administration.  I/O last 3 completed shifts: In: 5501.2 [P.O.:1; I.V.:3083; NG/GT:1900; IV Piggyback:517.3] Out: 5200 [Urine:5200] No intake/output data recorded.  + 18L since admission.  BMET    Component Value Date/Time   NA 139 09/08/2017 0246   K 3.6 09/08/2017 0246   CL 109 09/08/2017 0246   CO2 25 09/08/2017 0246   GLUCOSE 152 (H) 09/08/2017 0246   BUN 12 09/08/2017 0246   CREATININE 0.40 (L) 09/08/2017 0246   CALCIUM 8.0 (L) 09/08/2017 0246   GFRNONAA >60 09/08/2017 0246   GFRAA >60 09/08/2017 0246   Assessment: We will hold on further diuresis given change in goals of care.  QUALITY OF CARE MEASURES:  VAP bundle - in place. DVT prophylaxis enoxaparin 40mg . Enteral nutrition moderate risk. At goal rate of 72ml/h Stress ulcer prophylaxis famotidine 20mg  bid. Can switch to oral. Sedation reduction off all continuous sedation. Early mobility not appropriate due to mental status. Vascular secondary prevention atorvastatin 40/d, ASA 81mg , Plavix 75mg /d Glycemic control  Adequate control with no insulin coverage. No history of DM. Lines and drains  Foley required to monitor urine output during active diuresis. Antibiotic de-escalation on no antibiotics. Sepsis care bundle n/a.  CODE STATUS: DNR with no escalation.  Will transition to comfort care when family arrives.  CRITICAL CARE Performed by: Kipp Brood   Total critical care time: 60 minutes  Critical care time was  exclusive of separately billable procedures and treating other patients.  Critical care was necessary to treat or prevent imminent or life-threatening deterioration.  Critical care was time spent personally by me on the following activities: development of treatment plan with patient and/or surrogate as well as nursing, discussions with consultants, evaluation of patient's response to treatment, examination of patient, obtaining history from patient or surrogate, ordering and performing treatments and interventions, ordering and review of laboratory studies, ordering and review of radiographic studies, pulse oximetry and re-evaluation of patient's condition.  Kipp Brood, MD [facility] ICU Physician [place]  Pager: [phone] Mobile: [phone] After hours: [phone].

## 2017-09-11 NOTE — Progress Notes (Signed)
Subjective: PLEDs have returned.  CCM is had extensive discussion with family, and I discussed with him as well.  Daughter expresses wish to make patient comfort only.  Apparently even at baseline, prior to this event she was having significant problems due to her aphasia and right-sided weakness.  She was able to converse to some degree, but if there is any chance that she may not be able to get back to that, did not think that she would want any type of aggressive treatment.  Exam: Vitals:   09/11/17 0900 09/11/17 1000  BP: (!) 163/63 (!) 145/59  Pulse: (!) 108 (!) 106  Resp: (!) 26 (!) 23  Temp:    SpO2: 97% 95%   Gen: In bed, NAD Resp: non-labored breathing, no acute distress Abd: soft, nt  Neuro: MS: Does not open eyes, CN: PERRL, +hippus, Motor: she extends to noxious stimuli in all 4 extremities Sensory: responds to nox stim x 4.   Pertinent Labs: HgB 10.7  Impression: 67 yo F presenting with partial status epilepticus 2/2 previous stroke. She was likely in status epilepticus for 4 days prior to arrival. She did not respond to conventional AEDs and therefore burst suppression was pursued given the severity of the aphasia. She has been off of sedation since Sunday.  Unfortunately, periodic discharges on the left have returned, though there is no clear ictal evolution, these are on the ictal interictal continuum.  We can continue to optimize her Dilantin, phenobarbital, and Keppra, however the family is requesting comfort care at this time.  Recommendations: 1) daily PHT, PHB levels 2) continue dilantin, phenobarb, keppra 3) if comfort care measures are pursued, can discontinue EEG monitoring  This patient is critically ill and at significant risk of neurological worsening, death and care requires constant monitoring of vital signs, hemodynamics,respiratory and cardiac monitoring, neurological assessment, discussion with family, other specialists and medical decision making of  high complexity. I spent 40 minutes of neurocritical care time  in the care of  this patient.  Roland Rack, MD [facility] [phone]  If 7pm- 7am, please page neurology on call as listed in [place]. 09/11/2017  11:31 AM

## 2017-09-11 NOTE — Progress Notes (Signed)
Pt not suctioned at this time. Daughter praying with patient.

## 2017-09-11 NOTE — Procedures (Signed)
LTM-EEG Report  HISTORY: Continuous video-EEG monitoring performed for 67 year old with stroke, aphasia, focal status. ACQUISITION: International 10-20 system for electrode placement; 18 channels with additional eyes linked to ipsilateral ears and EKG. Additional T1-T2 electrodes were used. Continuous video recording obtained.   EEG NUMBER:  MEDICATIONS:  Day 9: PHB, PHT, LEV, LCM  DAY #9: from 0730 09/10/17 to 0730 09/11/17 BACKGROUND: An overall medium voltage continuous recording with some spontaneous variability and reactivity. The background consisted of medium voltage, irregular delta-theta activity bilaterally with sparse superimposed faster frequencies. Some mild waking background was seen briefly, however no sustained posterior basic rhythm was observed. Some sleep spindles were present bilaterally. EPILEPTIFORM/PERIODIC ACTIVITY: There were frequent left posterior periodic discharges or polyspike, often with superimposed fast activity. These were reflected over the right at times. These were increased with stimulation however did not show any definite ictal evolution. SEIZURES: No definite ictal evolution to left posterior polyspikes EVENTS: There was one event at 1100 for unclear reasons. There was no definite change to the patient's background left posterior spiking.  EKG: no significant arrhythmia  SUMMARY: This was a markedly abnormal continuous video EEG due to focal epileptiform discharges in the left posterior regions as well as generalized slowing. The epileptiform activity had superimposed fast activity and was concerning for an underlying seizure focus. No definite ictal evolution was seen to this pattern. Clinical correlation with examination warranted for subtle clinical findings.

## 2017-09-11 NOTE — Progress Notes (Signed)
[place] for phenytoin Indication: seizures  Allergies  Allergen Reactions  . Sulfa Antibiotics Diarrhea and Itching    Patient Measurements: Height: 5\' 2"  (157.5 cm) Weight: 158 lb 8.2 oz (71.9 kg) IBW/kg (Calculated) : 50.1 Adjusted Body Weight: 58 kg  Vital Signs: Temp: 99.5 F (37.5 C) (08/06 0800) Temp Source: Oral (08/06 0800) BP: 163/63 (08/06 0900) Pulse Rate: 108 (08/06 0900) Intake/Output from previous day: 08/05 0701 - 08/06 0700 In: 3800.6 [I.V.:2028.5; NG/GT:1400; IV Piggyback:372] Out: 4174 [Urine:4075] Intake/Output from this shift: Total I/O In: 599.4 [I.V.:229.1; NG/GT:220; IV Piggyback:150.3] Out: 400 [Urine:400]  Labs: Recent Labs    09/09/17 0302 09/10/17 0232  WBC 9.4 7.5  HGB 11.2* 10.7*  HCT 35.9* 34.2*  PLT 138* 155   Estimated Creatinine Clearance: 61.6 mL/min (A) (by C-G formula based on SCr of 0.4 mg/dL (L)).   Medical History: Past Medical History:  Diagnosis Date  . History of kidney stones    x1  . Hypertension     Medications:  Medications Prior to Admission  Medication Sig Dispense Refill Last Dose  . acetaminophen (TYLENOL) 325 MG tablet Take 2 tablets (650 mg total) by mouth every 6 (six) hours as needed for mild pain, fever or headache. (Patient taking differently: Take 650 mg by mouth every 6 (six) hours as needed for fever (general discomfort). )   unknown  . ALPRAZolam (XANAX) 0.5 MG tablet Take 1 tablet (0.5 mg total) by mouth 3 (three) times daily as needed for anxiety or sleep. (Patient taking differently: Take 0.5 mg by mouth every 8 (eight) hours as needed for anxiety or sleep. ) 30 tablet 0 08/26/2017 at 1324  . amantadine (SYMMETREL) 100 MG capsule Take 2 capsules (200 mg total) by mouth 2 (two) times daily. 10 capsule 0 08/30/2017 at 1700  . amLODipine (NORVASC) 10 MG tablet Take 1 tablet (10 mg total) by mouth daily.   08/30/2017 at 900  . aspirin 81 MG chewable tablet Chew  1 tablet (81 mg total) by mouth daily.   08/30/2017 at 900  . atorvastatin (LIPITOR) 40 MG tablet Take 1 tablet (40 mg total) by mouth daily.   08/30/2017 at 900  . cephALEXin (KEFLEX) 500 MG capsule Take 500 mg by mouth every 12 (twelve) hours. 7 day course (for UTI) started 08/28/17 pm   08/30/2017 at 2100  . clopidogrel (PLAVIX) 75 MG tablet Take 1 tablet (75 mg total) by mouth daily.   08/30/2017 at 900  . FLUoxetine (PROZAC) 40 MG capsule Take 1 capsule (40 mg total) by mouth at bedtime.  3 08/30/2017 at 2100  . levETIRAcetam (KEPPRA) 100 MG/ML solution Take 1,500 mg by mouth 2 (two) times daily.   08/30/2017 at 1700  . lisinopril (PRINIVIL,ZESTRIL) 5 MG tablet Take 5 mg by mouth daily.   08/30/2017 at 900  . metFORMIN (GLUCOPHAGE) 500 MG tablet Take 1 tablet (500 mg total) by mouth daily with breakfast.   08/30/2017 at 900  . nystatin (NYSTATIN) powder Apply topically See admin instructions. Apply topically to groin every 12 hours as needed for rash/irritation   unknown  . ondansetron (ZOFRAN) 4 MG tablet Take 4 mg by mouth every 8 (eight) hours as needed for nausea or vomiting.   08/30/2017 at 2024  . pantoprazole (PROTONIX) 40 MG tablet Take 1 tablet (40 mg total) by mouth daily.   08/30/2017 at 900  . polyethylene glycol (MIRALAX / GLYCOLAX) packet Take 17 g by mouth daily. Mix  with 8-10 oz water and drink   08/30/2017 at 900  . potassium chloride SA (K-DUR,KLOR-CON) 20 MEQ tablet Take 20 mEq by mouth daily.   08/30/2017 at 900  . PRESCRIPTION MEDICATION See admin instructions. Administer normal saline 1000 ml @@ 25 ml/hr via clysis every shift (day, evening, night)   08/31/2017 at night  . traMADol (ULTRAM) 50 MG tablet Take 1 tablet (50 mg total) by mouth every 8 (eight) hours as needed for severe pain. (Patient taking differently: Take 50 mg by mouth See admin instructions. Take one tablet (50 mg) by mouth every 12 hours, may also take one tablet every 8 hours as needed for severe pain) 15 tablet 0  08/30/2017 at 2100  . feeding supplement, GLUCERNA SHAKE, (GLUCERNA SHAKE) LIQD Take 237 mLs by mouth 3 (three) times daily between meals. (Patient not taking: Reported on 08/30/2017)   Not Taking at Unknown time  . levETIRAcetam (KEPPRA) 750 MG tablet Take 2 tablets (1,500 mg total) by mouth 2 (two) times daily. (Patient not taking: Reported on 08/17/2017)   Not Taking at Unknown time    Assessment: 67 y/o female with large MCA distribution stroke in 04/2017 and resultant seizures admitted 08/11/2017 with AMS. Patient was started on multiple AEDs with refractory seizures. Transferred to ICU on 09/05/17 for intubation and burst suppression. Midazolam has now been discontinued.   PTA regimen: Keppra 1500 mg PO bid  Current AED regimen: Keppra 1500 mg IV q12h from PTA Vimpat 200 mg IV q12h Phenytoin 100 mg IV q8h Phenobarbital 65 mg IV QHS   Levels: 8/5: Phenytoin lvl 9.5 (corrects to 15.8); Phb lvl 8.4 8/6: Phenytoin lvl 5.6 (corrects to 9.3); Phb lvl 10 after load    Goal of Therapy:  Total level 10-20 mcg/ml Free level 1-2 mcg/ml  Plan:  -Plans noted for one-way extubation -Phenytoin dose was increased back to 100 mg TID given slightly subtherapeutic level today   Albertina Parr, PharmD., BCPS Clinical Pharmacist Clinical phone for 09/11/17 until 3:30pm: [phone] If after 3:30pm, please refer to [facility] for unit-specific pharmacist

## 2017-09-11 NOTE — Progress Notes (Addendum)
Daily Progress Note   Patient Name: Ebony Williams       Date: 09/11/2017 DOB: 06-23-1950  Age: 67 y.o. MRN#: [id number] Attending Physician: Kipp Brood, MD Primary Care Physician: Sharilyn Sites, MD Admit Date: 08/30/2017  Reason for Consultation/Follow-up: Establishing goals of care, Psychosocial/spiritual support and Withdrawal of life-sustaining treatment  Subjective/GOC: Patient unresponsive on ventilator. Fentanyl and versed have been restarted for shift to comfort measures.   Spoke with daughter, Morey Hummingbird, in family waiting room. Discussed plan for transition to comfort measures with compassionate extubation to comfort. Educated on extubation process and EOL expectations. Educated on medication regimen to ensure comfort and relief from suffering. Explained prognosis of likely hours-days. Patient's pastor with family and has prayed over the patient. Reassured of continued support from chaplain services. Emotional/spiritual support provided. Morey Hummingbird understands she can call with questions/concerns.   Length of Stay: 10  Current Medications: Scheduled Meds:  . aspirin  81 mg Oral Daily  . atorvastatin  40 mg Oral Daily  . chlorhexidine gluconate (MEDLINE KIT)  15 mL Mouth Rinse BID  . clopidogrel  75 mg Oral Daily  . enoxaparin (LOVENOX) injection  40 mg Subcutaneous Q24H  . feeding supplement (PRO-STAT SUGAR FREE 64)  30 mL Per Tube BID  . feeding supplement (VITAL HIGH PROTEIN)  1,000 mL Per Tube Q24H  . furosemide  20 mg Intravenous Daily  . mouth rinse  15 mL Mouth Rinse 10 times per day  . PHENObarbital  65 mg Intravenous QHS  . phenytoin (DILANTIN) IV  100 mg Intravenous Q8H  . sodium chloride flush  3 mL Intravenous Q12H    Continuous Infusions: . sodium chloride Stopped  (09/11/17 1153)  . dextrose 5 % and 0.45 % NaCl with KCl 20 mEq/L Stopped (09/11/17 1132)  . famotidine (PEPCID) IV Stopped (09/10/17 1505)  . fentaNYL infusion INTRAVENOUS 100 mcg/hr (09/11/17 1206)  . lacosamide (VIMPAT) IV Stopped (09/11/17 1019)  . levETIRAcetam Stopped (09/11/17 0817)  . midazolam (VERSED) infusion      PRN Meds: acetaminophen (TYLENOL) oral liquid 160 mg/5 mL, bisacodyl, fentaNYL, fentaNYL (SUBLIMAZE) injection, hydrALAZINE, midazolam, nystatin  Physical Exam  Constitutional: She appears well-developed and well-nourished. She is sedated and intubated.  HENT:  Head: Normocephalic and atraumatic.  Cardiovascular: Normal rate.  Pulmonary/Chest: No accessory muscle usage. No tachypnea. She  is intubated. No respiratory distress.  Terminal extubation this afternoon.   Abdominal: There is no tenderness.  Genitourinary:  Genitourinary Comments: foley  Neurological:  Responds to noxious stimuli  Skin: Skin is warm and dry. There is pallor.  Psychiatric:  Unable to test  Nursing note and vitals reviewed.          Vital Signs: BP (!) 147/56   Pulse (!) 109   Temp 99.5 F (37.5 C) (Oral)   Resp (!) 22   Ht _0  (1.575 m)   Wt 71.9 kg (158 lb 8.2 oz)   SpO2 97%   BMI 28.99 kg/m  SpO2: SpO2: 97 % O2 Device: O2 Device: Ventilator O2 Flow Rate:    Intake/output summary:   Intake/Output Summary (Last 24 hours) at 09/11/2017 1230 Last data filed at 09/11/2017 1200 Gross per 24 hour  Intake 4419.27 ml  Output 4375 ml  Net 44.27 ml   LBM: Last BM Date: 09/05/17 Baseline Weight: Weight: 67.6 kg (149 lb) Most recent weight: Weight: 71.9 kg (158 lb 8.2 oz)       Palliative Assessment/Data: PPS 10%   Flowsheet Rows     Most Recent Value  Intake Tab  Referral Department  -- [ED]  Unit at Time of Referral  ER  Palliative Care Primary Diagnosis  Neurology  Date Notified  08/23/2017  Palliative Care Type  Return patient Palliative Care  Reason for referral   Clarify Goals of Care  Date of Admission  08/21/2017  Date first seen by Palliative Care  09/03/17  # of days Palliative referral response time  2 Day(s)  # of days IP prior to Palliative referral  0  Clinical Assessment  Psychosocial & Spiritual Assessment  Palliative Care Outcomes      Patient Active Problem List   Diagnosis Date Noted  . Metabolic encephalopathy 15/06/6977  . Seizure, late effect of stroke ([place]) 08/19/2017  . 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 Malnutrition of moderate degree 08/06/2017  . DNR (do not resuscitate) discussion   . Seizure ([place])   . Palliative care by specialist   . Goals of care, counseling/discussion   . Altered mental status 08/01/2017  . Right arm weakness 06/16/2017  . Aphasia 06/16/2017  . Spastic hemiplegia of right dominant side as late effect of cerebral infarction ([place])   . Hyponatremia   . Labile blood pressure   . Hypokalemia   . Elevated BUN   . Lethargy   . Transaminitis   . Essential hypertension   . Hypertensive crisis   . 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 Depression   . Dysphagia, post-stroke   . Diastolic dysfunction   . Benign essential HTN   . Prediabetes   . Leukocytosis   . Cerebral infarction due to occlusion of left middle cerebral artery ([place]) s/p IV tPA 04/06/2017  . Stroke (cerebrum) ([place]) 04/06/2017  . 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 Pain in joint, shoulder region 10/18/2011    Palliative Care Assessment & Plan   Patient Profile: This 67 year old female past medical history of large MCA stroke in March 2019. Since then per the family she has had significant difficulty in her recovery. At her best she is wheelchair-bound with significant right-sided hemiparesis. She was admitted to the hospital on 08/06/2017. Critical care was paged by neurology this morning for ongoing status epilepticus. Since admission she is currently received phenobarbital, Vimpat, Keppra as well as Dilantin.  After discussion with neurology as well as  the hospitalist the recommendation was for admission to the intensive care unit, intubation and initiation of burst suppression through sedation.  Consult ordered for goals of care. As of 09/07/2017, patient remains intubated. She is now off Versed but remains on 4 anticonvulsants and minimally responsive.  However, during the evening of 09/08/2017 Versed had to be restarted because of resumption of seizure activity.  Per palliative medicine provider,A.Parker, NP, patient's family shares that she would not want to remain in this debilitated state where she unable able to communicate or improve  Versed stopped 8/4 at 1500. Fentanyl stopped this AM to attempt wake-up assessment. Ongoing continuous EEG.   Recommendations/Plan:  Daughter and husband ready for shift to comfort measures only and compassionate extubation. Family has spoken clearly of the patient's wishes against prolonged life support measures if baseline would continue to be poor quality of life.   Fentanyl and Versed continuous infusions per PCCM.   Robinul 0.11m IV q2h prn secretions.   Family understands poor prognosis of hours-days and anticipate hospital death.   Spiritual support from pastor who is present.   PMT will continue to provide support to patient/family through EOL.   Goals of Care and Additional Recommendations:  Limitations on Scope of Treatment: Full Comfort Care, No Surgical Procedures and No Tracheostomy  Code Status:DNR/DNI    Code Status Orders  (From admission, onward)        Start     Ordered   09/05/17 1204  Limited resuscitation (code)  Continuous    Question Answer Comment  In the event of cardiac or respiratory ARREST: Initiate Code Blue, Call Rapid Response No   In the event of cardiac or respiratory ARREST: Perform CPR No   In the event of cardiac or respiratory ARREST: Perform Intubation/Mechanical Ventilation Yes   In the event of  cardiac or respiratory ARREST: Use NIPPV/BiPAp only if indicated Yes   In the event of cardiac or respiratory ARREST: Administer ACLS medications if indicated No   In the event of cardiac or respiratory ARREST: Perform Defibrillation or Cardioversion if indicated No   Comments Already intubated      09/05/17 1204    Code Status History    Date Active Date Inactive Code Status Order ID Comments User Context   08/29/2017 1711 09/03/2017 1311 DNR [id number] SLady Deutscher MD ED   08/01/2017 2332 08/07/2017 1911 Partial Code [id number] LOswald Hillock MD Inpatient   06/16/2017 1622 06/22/2017 1755 Full Code [id number] WRondel Jumbo PA-C ED   04/11/2017 1640 05/08/2017 1849 Full Code [id number] ACathlyn Parsons PA-C Inpatient   04/11/2017 1640 04/11/2017 1640 Full Code [id number] ACathlyn Parsons PA-C Inpatient   04/06/2017 2220 04/11/2017 1634 Full Code [id number] LKerney Elbe MD Inpatient    Advance Directive Documentation     Most Recent Value  Type of Advance Directive  Out of facility DNR (pink MOST or yellow form)  Pre-existing out of facility DNR order (yellow form or pink MOST form)  -  "MOST" Form in Place?  -       Prognosis:  Hours-days  Discharge Planning:   Anticipate hospital death  Care plan was discussed with RN, multiple family members including daughter.    Thank you for allowing the Palliative Medicine Team to assist in the care of this patient.   Time In: 1155 Time Out: 1230 Total Time 344m Prolonged Time Billed  no       Greater than 50%  of this time was spent counseling and coordinating care related to the above assessment and plan.  Ihor Dow, FNP-C Palliative Medicine Team  Phone: [phone] Fax: [phone]  Please contact Palliative Medicine Team phone at [phone] for questions and concerns.

## 2017-09-11 NOTE — Progress Notes (Signed)
   09/11/17 1200  Clinical Encounter Type  Visited With Other (Comment)  Visit Type Follow-up  Referral From Nurse  Consult/Referral To [place] [address] was rounding and able to check in with nurse, no family present in the room.  Chaplain prayed for the PT in lieu of family being present.  Chaplain will check in again to see if family is present

## 2017-09-11 NOTE — Progress Notes (Signed)
DC LTM EEG, pt moving to comfort care

## 2017-09-11 NOTE — Progress Notes (Signed)
Pt extubated per withdrawal order.

## 2017-09-12 ENCOUNTER — Telehealth: Payer: Self-pay

## 2017-09-14 NOTE — Telephone Encounter (Signed)
Received D/C back from Dr. Lynetta Mare he signed the original we received today. Mailed to [employer]. PWR

## 2017-10-04 ENCOUNTER — Encounter: Payer: Medicare Other | Admitting: Physical Medicine & Rehabilitation

## 2017-10-07 NOTE — Progress Notes (Signed)
Patient began to brady down shortly after 0400. Patient went asystole at 0417. Listened to patient for 2 minutes by Phebe Colla, RN and Lowella Bandy, RN and declared deceased.    225 mL of fentanyl and 60 mL of versed wasted with Shea Evans, RN in sink

## 2017-10-07 NOTE — Death Summary Note (Signed)
DEATH SUMMARY   Patient Details  Name: Ebony Williams MRN: [id number] DOB: 02/16/1950  Admission/Discharge Information   Admit Date:  2017/09/18  Date of Death: Date of Death: 09/29/17  Time of Death: Time of Death: 0417  Length of Stay: 05/04/2022  Referring Physician: Sharilyn Sites, MD   Reason(s) for Hospitalization  non-convulsive status epilepticus.  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 year old female who has a known seizure disorder secondary to recent stroke in 3/19. Presented with altered mentation and found to be in NCSE. Adequate control of seizures was never achieved despite multiple anticonvulsants and high dose versed infusion.    Given patient's marginal premorbid health, the family opted for withdrawal of care.     Pertinent Labs and Studies  Significant Diagnostic Studies Dg Chest 2 View  Result Date: 18-Sep-2017 CLINICAL DATA:  Altered mental status EXAM: CHEST - 2 VIEW COMPARISON:  08/24/2017 chest radiograph. FINDINGS: Stable cardiomediastinal silhouette with top-normal heart size. No pneumothorax. No pleural effusion. Stable smooth mild lateral left pleural thickening. No pulmonary edema. No acute consolidative airspace disease. IMPRESSION: No acute cardiopulmonary  disease. Stable mild smooth lateral left pleural thickening. Electronically Signed   By: Ilona Sorrel M.D.   On: September 18, 2017 13:37   Dg Chest 2 View  Result Date: 08/24/2017 CLINICAL DATA:  Altered mental status EXAM: CHEST - 2 VIEW COMPARISON:  06/16/2017 FINDINGS: There is mild cardiac enlargement. There is no pleural effusion or edema identified. No airspace opacities identified. The visualized osseous structures are unremarkable. IMPRESSION: 1. No acute cardiopulmonary abnormalities. Electronically Signed   By: Kerby Moors M.D.   On: 08/24/2017 16:16   Ct Head Wo Contrast  Result Date: 09-18-17 CLINICAL DATA:  66 year old female with altered level of consciousness. EXAM: CT HEAD WITHOUT CONTRAST TECHNIQUE: Contiguous axial images were obtained from the base of the skull through the vertex without intravenous contrast. COMPARISON:  08/24/2017 MR and CT, and prior studies FINDINGS: Brain: No evidence of acute infarction, hemorrhage, hydrocephalus, extra-axial collection or mass lesion/mass effect. Mild atrophy, chronic small-vessel white matter ischemic changes and remote LEFT basal ganglia/thalamic, posterior parietal and occipital infarcts again noted. Vascular: Intracranial carotid atherosclerotic calcifications again noted. Skull: Normal. Negative for fracture or focal lesion. Sinuses/Orbits: No acute finding. Other: None. IMPRESSION: 1. No evidence of acute intracranial abnormality 2. Atrophy, chronic small-vessel white matter ischemic changes and remote LEFT-sided infarcts. Electronically Signed   By: Margarette Canada M.D.   On: 09/18/17 16:10   Ct Head Wo Contrast  Result Date: 08/24/2017 CLINICAL DATA:  67 year old female with slurred speech. EXAM: CT HEAD WITHOUT CONTRAST TECHNIQUE: Contiguous axial images were obtained from the base of the skull through the vertex without intravenous contrast. COMPARISON:  Head CT without contrast 08/01/2017. Brain MRI 06/16/2017, and earlier. FINDINGS:  Brain: Stable appearance of developing  encephalomalacia related to the left basal ganglia and left lateral PCA/MCA watershed territory infarcts earlier this year. No associated mass effect. Superimposed chronic encephalomalacia in the left superior parietal lobe is stable. Patchy and confluent bilateral cerebral white matter and thalamic hypodensity appears stable. Stable posterior fossa gray-white matter differentiation. No midline shift, ventriculomegaly, mass effect, evidence of mass lesion, intracranial hemorrhage or evidence of cortically based acute infarction. Gray-white matter differentiation is within normal limits throughout the brain. Vascular: Calcified atherosclerosis at the skull base. No suspicious intracranial vascular hyperdensity. Skull: Negative. Sinuses/Orbits: Visualized paranasal sinuses and mastoids are stable and well pneumatized. Other: Visualized orbit soft tissues are within normal limits. Visualized scalp soft tissues are within normal limits. IMPRESSION: Advanced hemispheric and deep gray matter ischemia appears stable by CT since June. No acute intracranial abnormality identified. Electronically Signed   By: Genevie Ann M.D.   On: 08/24/2017 16:05   Mr Brain Wo Contrast  Result Date: 08/24/2017 CLINICAL DATA:  Speech difficulties. History of hypertension and stroke. EXAM: MRI HEAD WITHOUT CONTRAST TECHNIQUE: Multiplanar, multiecho pulse sequences of the brain and surrounding structures were obtained without intravenous contrast. COMPARISON:  CT HEAD August 24, 2017 and MRI of the head Jun 16, 2017. FINDINGS: Moderate to severely motion degraded examination. Patient could not tolerate further imaging. Sagittal T1 and coronal T2 sequences not obtained. INTRACRANIAL CONTENTS: No reduced diffusion to suggest acute ischemia. Spurs reduced diffusion LEFT basal ganglia and LEFT parietoccipital lobe junction associated with susceptibility artifact and old infarcts. No susceptibility artifact to  suggest acute hemorrhage. Old LEFT basal ganglia infarct. Patchy T2 bright signal basal ganglia and thalami seen with chronic small vessel ischemic changes. Moderate parenchymal brain volume loss. Ex vacuo dilatation LEFT lateral ventricle. Diminutive LEFT cerebral peduncle compatible with wallerian degeneration. Patchy to confluent supratentorial and pontine white matter FLAIR T2 hyperintensities. No midline shift or mass effect. No abnormal extra-axial fluid collections. VASCULAR: Major intracranial vascular flow voids present at skull base. SKULL AND UPPER CERVICAL SPINE: Limited assessment. SINUSES/ORBITS: Mild LEFT maxillary sinus mucosal thickening. Nondiagnostic assessment of orbits. OTHER: None. IMPRESSION: 1. Moderate to severely motion degraded incomplete examination. 2. No acute intracranial process. 3. Old LEFT MCA and posterior border zone infarcts. 4. Moderate to severe chronic small vessel ischemic changes. 5. Moderate parenchymal brain volume loss. Electronically Signed   By: Elon Alas M.D.   On: 08/24/2017 17:54   Dg Chest Port 1 View  Result Date: 09/08/2017 CLINICAL DATA:  Aspiration pneumonia EXAM: PORTABLE CHEST 1 VIEW COMPARISON:  09/05/2017 FINDINGS: Endotracheal tube and nasogastric catheter are well seen and stable. Cardiac shadow is within normal limits. Mild left retrocardiac density is noted slightly increased from the prior exam. No other focal infiltrate is noted. No bony abnormality is noted. IMPRESSION: Slight increase in left basilar retrocardiac density. Electronically Signed   By: Inez Catalina M.D.   On: 09/08/2017 08:56   Dg Chest Port 1 View  Result Date: 09/05/2017 CLINICAL DATA:  Ventilator support. EXAM: PORTABLE CHEST 1 VIEW COMPARISON:  08/20/2017 FINDINGS: Endotracheal tube tip is 1 cm above the carina. Orogastric or nasogastric tube enters the stomach. The lungs are clear except for mild atelectasis in the left lower lobe. No edema or visible effusion.  Atherosclerosis and tortuosity of the aorta. IMPRESSION: Endotracheal tube and orogastric tube appear well positioned. Mild atelectasis in the left lower lobe. Electronically Signed   By: Nelson Chimes M.D.   On: 09/05/2017 11:46   Dg Abd Portable 1v  Result Date:  09/05/2017 CLINICAL DATA:  Orogastric placement. EXAM: PORTABLE ABDOMEN - 1 VIEW COMPARISON:  None. FINDINGS: Orogastric tube enters the stomach with its tip in the fundus. Gas pattern is unremarkable. No abnormal calcifications or bone findings. IMPRESSION: Orogastric tube tip in the gastric fundus. Gas pattern unremarkable. Electronically Signed   By: Nelson Chimes M.D.   On: 09/05/2017 11:47    Microbiology Recent Results (from the past 240 hour(s))  MRSA PCR Screening     Status: None   Collection Time: 09/05/17 10:46 AM  Result Value Ref Range Status   MRSA by PCR NEGATIVE NEGATIVE Final    Comment:        The GeneXpert MRSA Assay (FDA approved for NASAL specimens only), is one component of a comprehensive MRSA colonization surveillance program. It is not intended to diagnose MRSA infection nor to guide or monitor treatment for MRSA infections. Performed at [facility], [place] [address], [place], [place] [address]   Culture, Urine     Status: None   Collection Time: 09/05/17 11:27 AM  Result Value Ref Range Status   Specimen Description URINE, CATHETERIZED  Final   Special Requests Immunocompromised  Final   Culture   Final    NO GROWTH Performed at [facility], [place] [address] [place], [place] [address]    Report Status 09/06/2017 FINAL  Final  Culture, respiratory (non-expectorated)     Status: None   Collection Time: 09/05/17 11:51 AM  Result Value Ref Range Status   Specimen Description TRACHEAL ASPIRATE  Final   Special Requests NONE  Final   Gram Stain   Final    MODERATE WBC PRESENT,BOTH PMN AND MONONUCLEAR NO ORGANISMS SEEN    Culture   Final    FEW Consistent with normal respiratory  flora. Performed at [facility], [place] [address], [place], [place] [address]    Report Status 09/07/2017 FINAL  Final    Lab Basic Metabolic Panel: Recent Labs  Lab 09/06/17 0252 09/07/17 0317 09/08/17 0246  NA 140 140 139  K 3.7 4.0 3.6  CL 109 111 109  CO2 24 21* 25  GLUCOSE 107* 137* 152*  BUN 11 12 12   CREATININE 0.56 0.46 0.40*  CALCIUM 9.1 8.4* 8.0*  MG  --  1.6* 1.8  PHOS  --  4.1  --    Liver Function Tests: Recent Labs  Lab 09/07/17 0317  AST 18  ALT 12  ALKPHOS 61  BILITOT 0.3  PROT 4.9*  ALBUMIN 2.5*   No results for input(s): LIPASE, AMYLASE in the last 168 hours. No results for input(s): AMMONIA in the last 168 hours. CBC: Recent Labs  Lab 09/06/17 0252 09/07/17 0317 09/08/17 0246 09/09/17 0302 09/10/17 0232  WBC 10.9* 8.9 8.7 9.4 7.5  NEUTROABS 5.0 6.4 5.8 5.6 4.3  HGB 14.1 13.1 13.2 11.2* 10.7*  HCT 43.3 41.0 40.1 35.9* 34.2*  MCV 96.0 97.9 95.9 99.7 98.8  PLT 184 147* 143* 138* 155   Cardiac Enzymes: Recent Labs  Lab 09/06/17 0252 09/07/17 0317  CKTOTAL 23* 33*   Sepsis Labs: Recent Labs  Lab 09/07/17 0317 09/08/17 0246 09/09/17 0302 09/10/17 0232  WBC 8.9 8.7 9.4 7.5    Procedures/Operations  Mechanical ventilation, continuous EEG.   Takela Varden 09/28/17, 5:44 PM

## 2017-10-07 NOTE — Progress Notes (Signed)
Vimpat 200mg  in 86ml NS returned and wasted in sink. Tillie Fantasia / [facility]

## 2017-10-07 NOTE — Telephone Encounter (Signed)
On 04-Oct-2017, a D/C was received from [facility] D/C is for cremation. The Patient is a patient of Dr. Lynetta Mare. The D/C will be taken to [facility] for [employer].  On 09/13/17 I received the signed D/C back from Dr. Lynetta Mare. I got the D/C really and called the funeral home to let them know the D/C is ready for pickup. I also faxed a copy to the funeral home per the funeral home request.

## 2017-10-07 DEATH — deceased

## 2017-10-18 ENCOUNTER — Ambulatory Visit: Payer: Medicare Other | Admitting: Adult Health

## 2019-06-25 IMAGING — MR MR HEAD WO/W CM
10 of 13 series · 31 of 48 positions shown · IV contrast (multihance)
Comparison: Head CT and CTA head and neck earlier today. Brain MRI
04/07/2017.

CLINICAL DATA: 67-year-old female with abnormal speech. Left MCA
and PCA territory infarcts in [REDACTED].

EXAM:
MRI HEAD WITHOUT AND WITH CONTRAST
TECHNIQUE: Multiplanar, multiecho pulse sequences of the brain and surrounding
structures were obtained without and with intravenous contrast.
CONTRAST:  14mL MULTIHANCE GADOBENATE DIMEGLUMINE 529 MG/ML IV SOLN

[Series 3: DWI · axial · 3.0mm · 0.94mm/px · z∈[-73,+86]mm · 7 of 110 slices shown (1 of 2)]
[im 1/110]
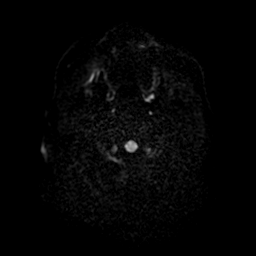
[im 19/110]
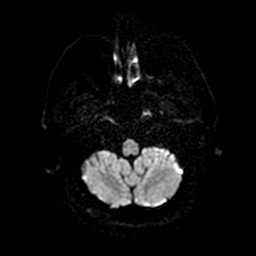
[im 37/110]
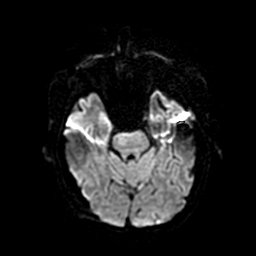
[im 55/110]
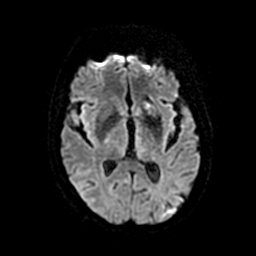
[im 73/110]
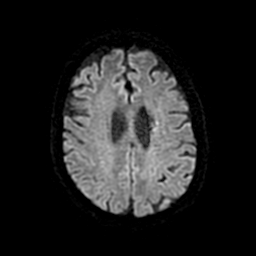
[im 91/110]
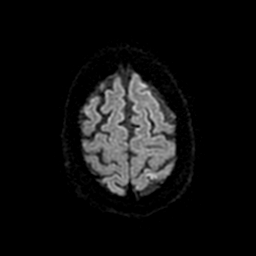
[im 110/110]
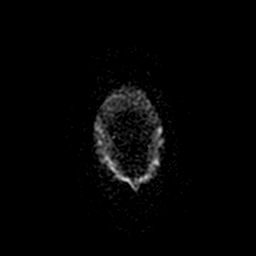

[Series 4: DWI · coronal · 4.0mm · 0.94mm/px · 6 of 74 slices shown (2 of 2)]
[im 1/74]
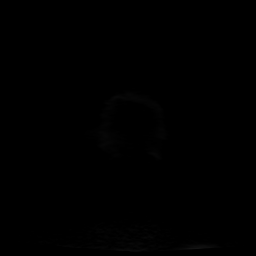
[im 15/74]
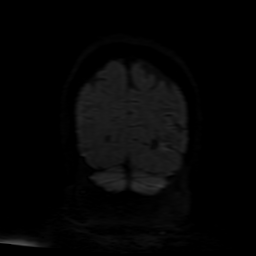
[im 30/74]
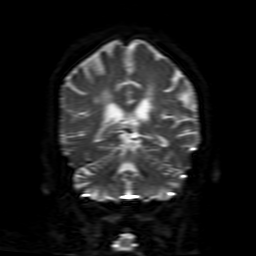
[im 44/74]
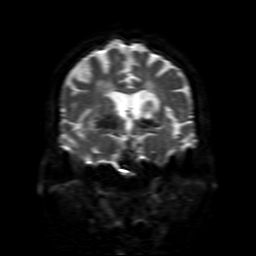
[im 59/74]
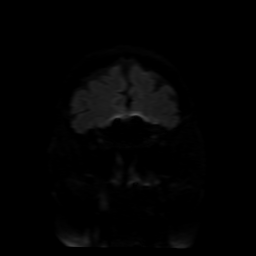
[im 74/74]
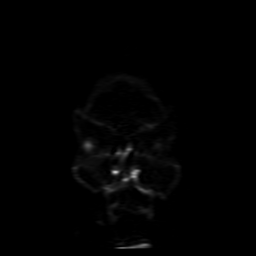

[Series 5: FLAIR · sagittal · 5.0mm · 0.47mm/px · 2 of 23 slices shown (1 of 3)]
[im 1/23]
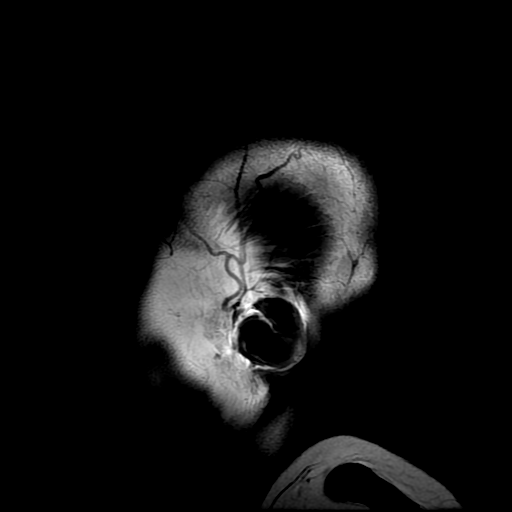
[im 23/23]
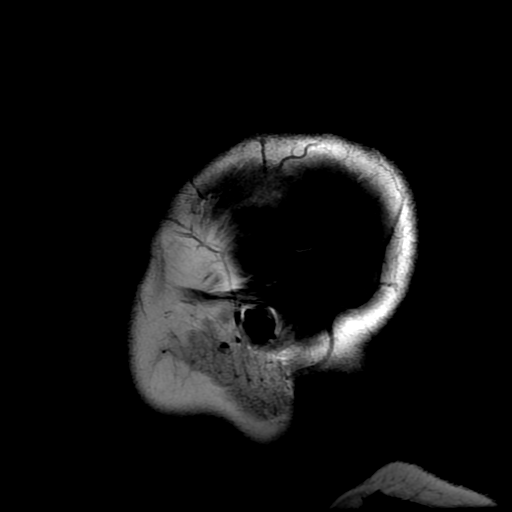

[Series 7: T2 · axial · 5.0mm · 0.47mm/px · z∈[-81,+79]mm · 2 of 28 slices shown (1 of 2)]
[im 1/28]
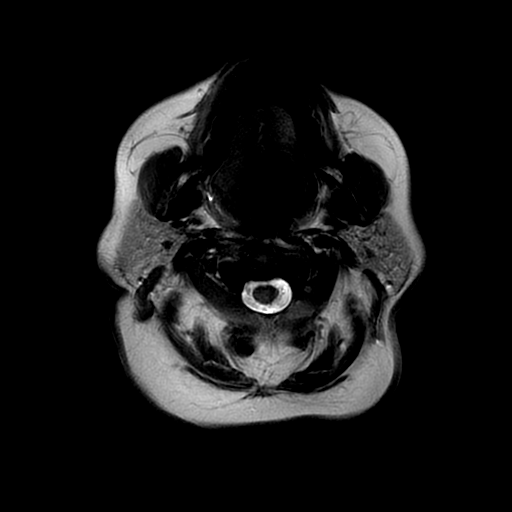
[im 28/28]
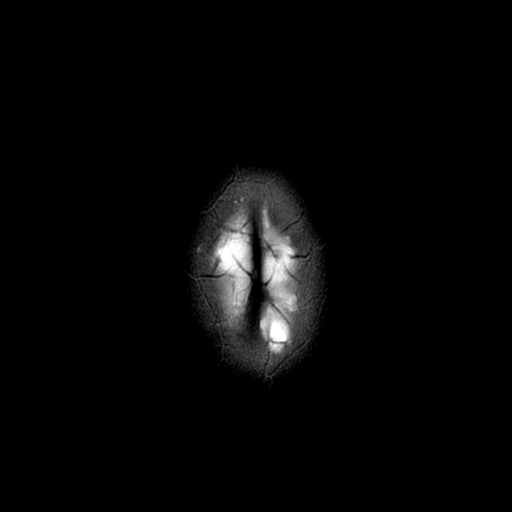

[Series 9: FLAIR · axial · 3.0mm · 0.47mm/px · z∈[-81,+79]mm · 2 of 28 slices shown (2 of 3)]
[im 1/28]
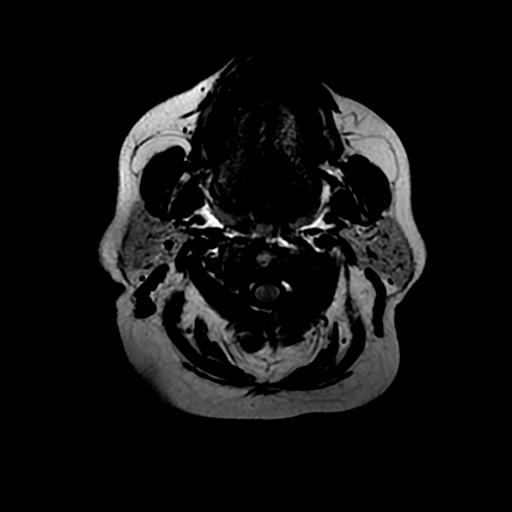
[im 28/28]
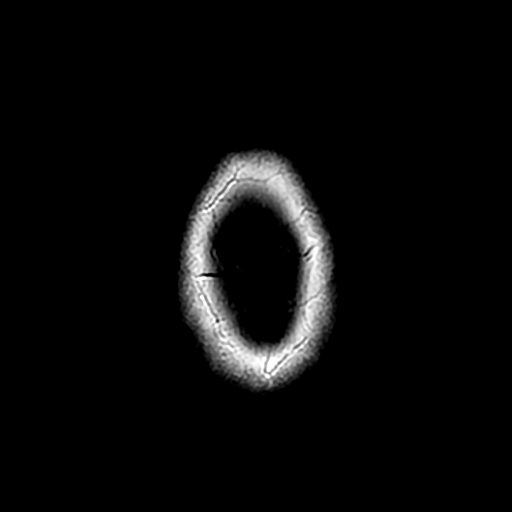

[Series 11: T2 · coronal · 5.0mm · 0.39mm/px · 2 of 27 slices shown (2 of 2)]
[im 1/27]
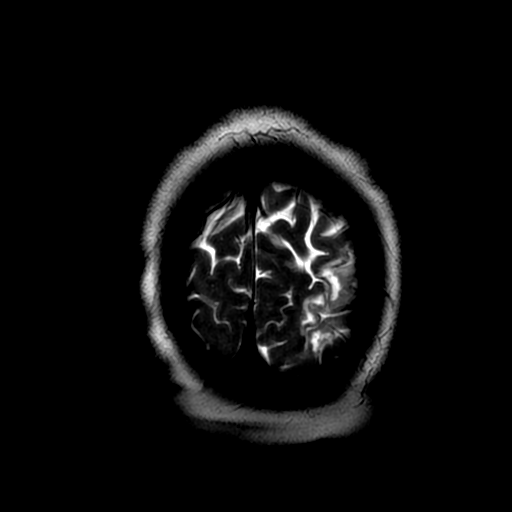
[im 27/27]
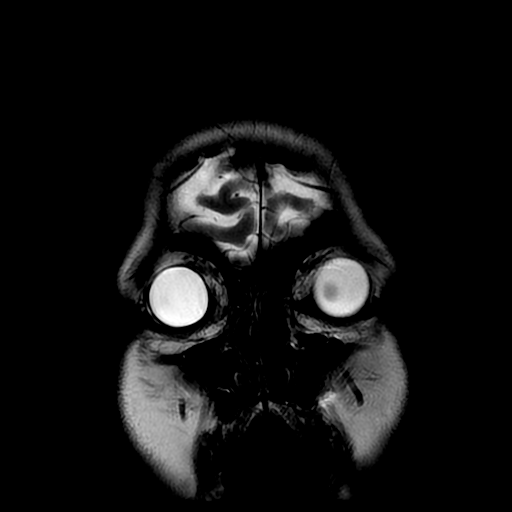

[Series 13: T1 · coronal · 5.0mm · 0.39mm/px · 1 of 27 slices shown]
[im 1/27]
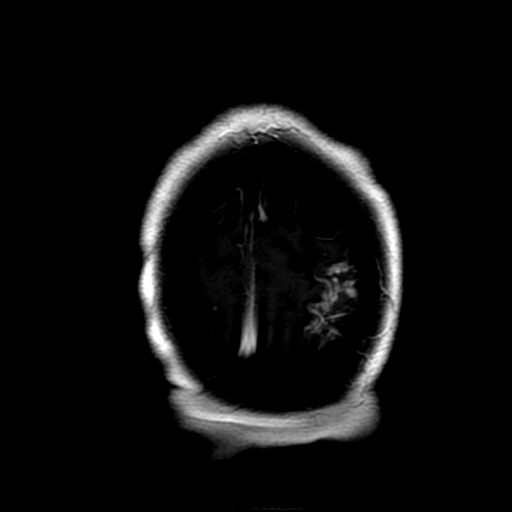

[Series 14: FLAIR · sagittal · 5.0mm · 0.47mm/px · 2 of 23 slices shown (3 of 3)]
[im 1/23]
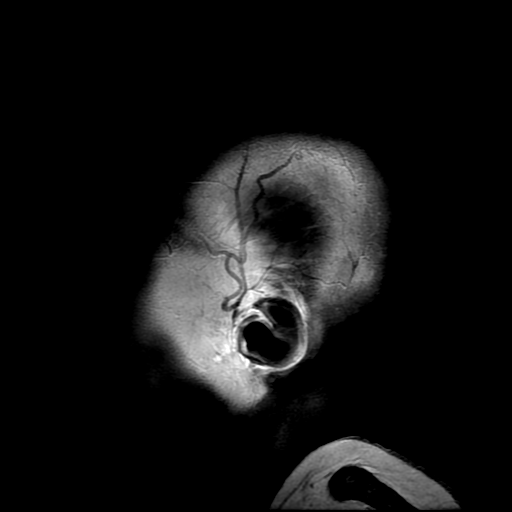
[im 23/23]
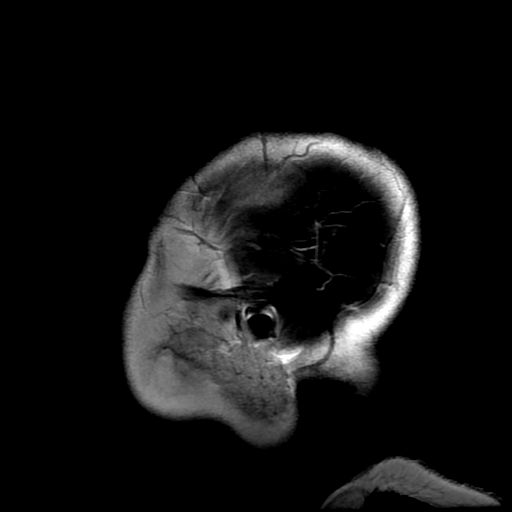

[Series 350: ADC · axial · 3.0mm · 0.94mm/px · z∈[-73,+86]mm · 4 of 54 slices shown (1 of 2)]
[im 1/54]
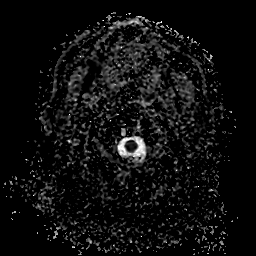
[im 18/54]
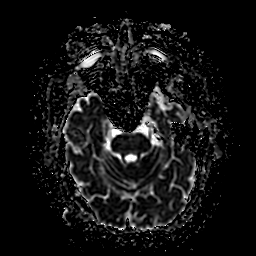
[im 36/54]
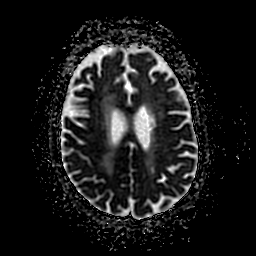
[im 54/54]
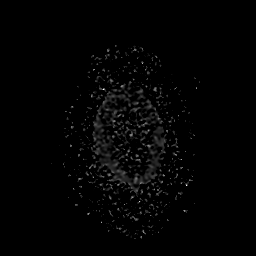

[Series 450: ADC · coronal · 4.0mm · 0.94mm/px · 3 of 37 slices shown (2 of 2)]
[im 1/37]
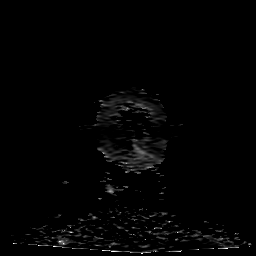
[im 19/37]
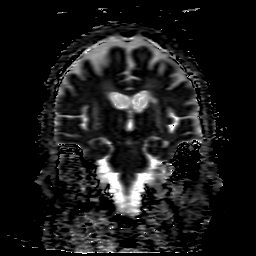
[im 37/37]
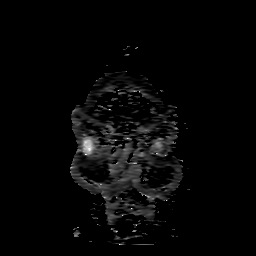

[31 of 48 positions shown; findings below may reference images not displayed]

FINDINGS: Brain:

Continued confluent diffusion abnormality in the left basal ganglia
since [REDACTED] with superimposed developing cystic encephalomalacia
which is currently most pronounced in the left caudate. Intrinsic T1
hyperintensity has developed in this area compatible with petechial
hemorrhage and/or laminar necrosis, although there is mild
superimposed post ischemic enhancement.

The remaining scattered left MCA territory cortical diffusion
abnormality has resolved since [REDACTED], with cortical and cystic
encephalomalacia in the superior left parietal lobe including
portions of the sensory strip. There is minimal post ischemic
enhancement.

There also remains some residual confluent diffusion abnormality in
the lateral left occipital lobe, although the majority of the
parenchymal diffusion restriction has resolved. Similar intrinsic T1
cortical laminar necrosis is noted at that site with developing T2
and FLAIR hyperintense encephalomalacia, and petechial hemorrhage.
Ex vacuo enlargement of the left occipital horn has begun. There is
also patchy post ischemic enhancement.

No new area of restricted diffusion today. No contralateral or
posterior fossa diffusion abnormality.

No midline shift, evidence of mass lesion, ventriculomegaly,
extra-axial collection or malignant intracranial hemorrhage.

Outside of the above findings confluent bilateral cerebral white
matter T2 and FLAIR hyperintensity is stable. T2 heterogeneity in
the bilateral medial thalami has not definitely changed. Patchy T2
hyperintensity throughout much of the pons also is felt to be
stable.

No other abnormal intracranial enhancement.  No dural thickening.

Cervicomedullary junction and pituitary are within normal limits.

Vascular: Major intracranial vascular flow voids are preserved. The
major dural venous sinuses are enhancing and appear patent.

Skull and upper cervical spine: Negative visible cervical spine and
spinal cord. Visualized bone marrow signal is within normal limits.

Sinuses/Orbits: Normal orbits soft tissues. Bilateral paranasal
sinus disease has largely resolved.

Other: Mastoid air cells remain clear. Visible internal auditory
structures appear normal. Scalp and face soft tissues appear
negative.
IMPRESSION: 1. Evolving ischemia with petechial hemorrhage, laminar necrosis,
and developing encephalomalacia in the left MCA and PCA parenchyma
affected on 04/07/2017. Continued diffusion abnormality in the left
basal ganglia and portions of the left occipital lobe. Post ischemic
enhancement.

2. Underlying advanced chronic small vessel disease suspected.
Pronounced chronic appearing signal changes in the medial thalami
and pons.

3. No new intracranial ischemia or new intracranial abnormality
identified.

## 2019-06-25 IMAGING — CT CT ANGIO NECK
2 of 7 series · 8 of 33 positions shown · IV contrast (iopamidol)
Comparison: CT earlier same day. Multiple previous neuro imaging
studies this year.

CLINICAL DATA: Focal neurological deficit. Speech disturbance. Last
seen normal 6777 hours.

EXAM:
CT ANGIOGRAPHY HEAD AND NECK
TECHNIQUE: Multidetector CT imaging of the head and neck was performed using
the standard protocol during bolus administration of intravenous
contrast. Multiplanar CT image reconstructions and MIPs were
obtained to evaluate the vascular anatomy. Carotid stenosis
measurements (when applicable) are obtained utilizing NASCET
criteria, using the distal internal carotid diameter as the
denominator.
CONTRAST:  50mL VAKRVI-3MD IOPAMIDOL (VAKRVI-3MD) INJECTION 76%

[Series 5: cta neck/head · axial · 0.50mm/px · z∈[-200,-88]mm · 2 of 169 slices shown]
[im 57/169  soft-tissue]
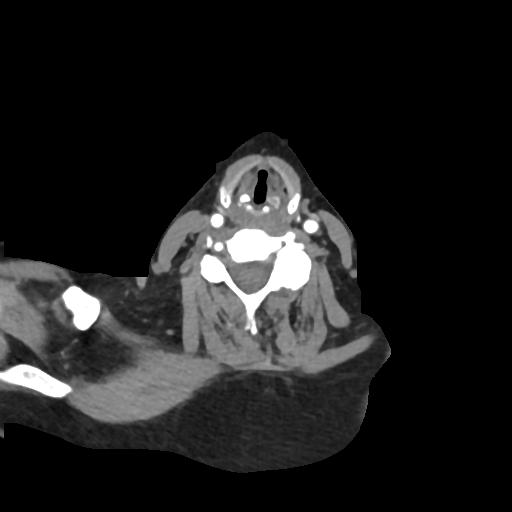
[im 113/169  soft-tissue]
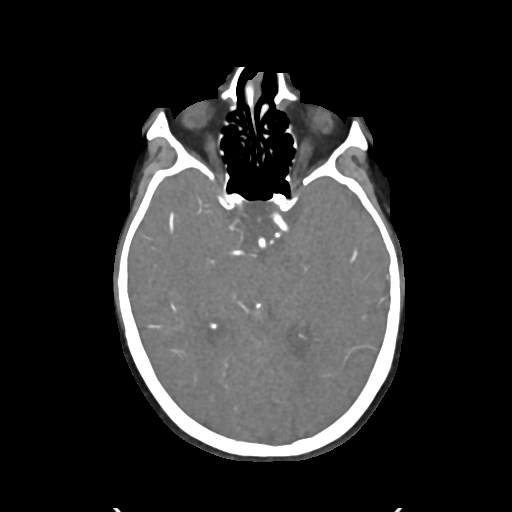

[Series 7: ax thins · axial · 0.51mm/px · z∈[-263,-23]mm · 6 of 337 slices shown]
[im 49/337  soft-tissue]
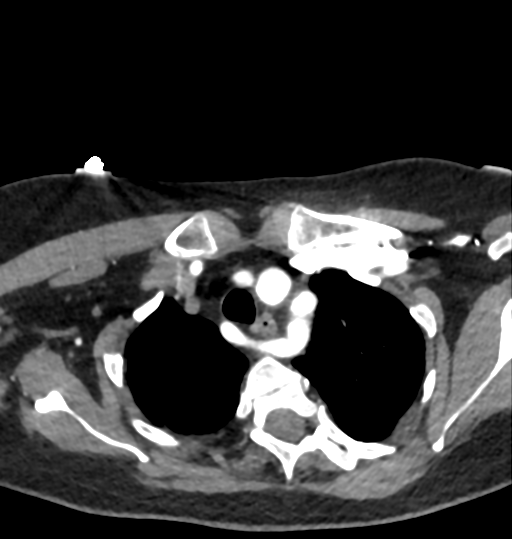
[im 97/337  bone]
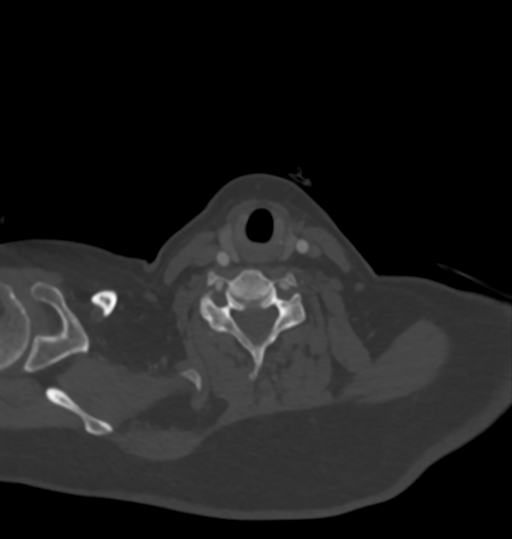
[im 145/337  soft-tissue]
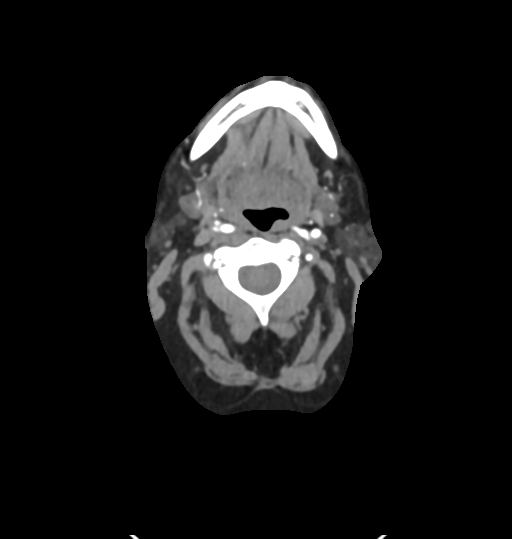
[im 193/337  bone]
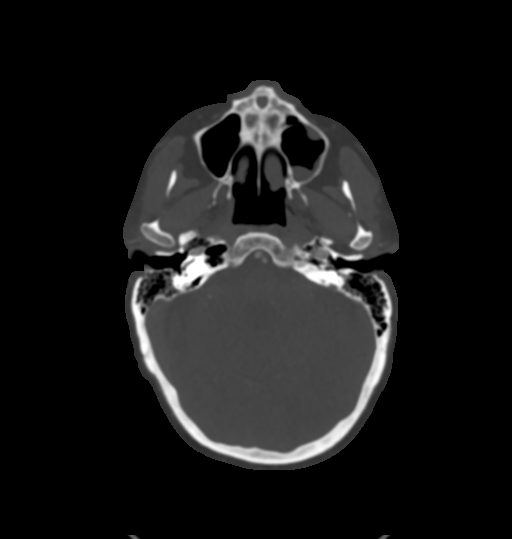
[im 241/337  soft-tissue]
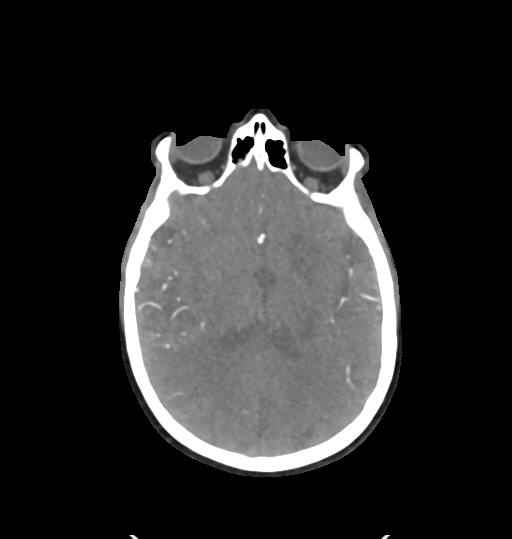
[im 289/337  bone]
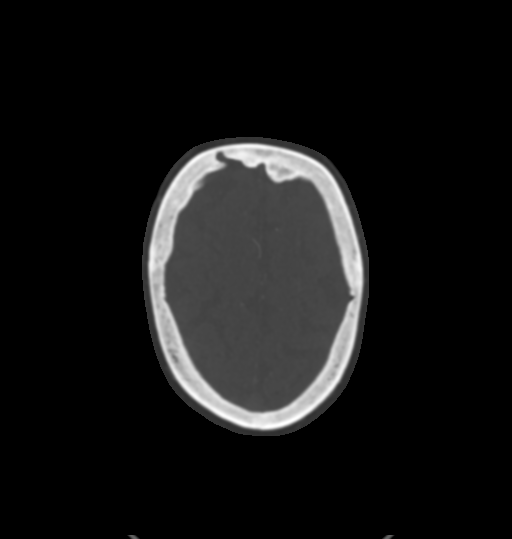

[8 of 33 positions shown; findings below may reference images not displayed]

FINDINGS: CTA NECK FINDINGS

Aortic arch: Aortic atherosclerosis. Anomalous branching pattern
with the right subclavian artery being the last vessel arising from
the arch. Typical diverticulum at that origin.

Right carotid system: Common carotid artery widely patent to the
bifurcation. No carotid bifurcation atherosclerotic disease.
Cervical ICA widely patent.

Left carotid system: Common carotid artery widely patent to the
bifurcation. Minimal atherosclerotic plaque at the bifurcation but
no stenosis or irregularity. Cervical ICA widely patent.

Vertebral arteries: Both vertebral arteries widely patent at their
origins and through the cervical region to the foramen magnum.

Skeleton: Minimal cervical spondylosis. Upper thoracic curvature and
congenital failure of separation.

Other neck: No mass or lymphadenopathy.

Upper chest: Negative

Review of the MIP images confirms the above findings

CTA HEAD FINDINGS

Anterior circulation: Both internal carotid arteries are patent
through the skull base and siphon regions. Peripheral
atherosclerotic calcification in the carotid siphon regions without
stenosis more than about 25%. Cervical internal carotid arteries
widely patent. The anterior and middle cerebral vessels are patent
without proximal stenosis, aneurysm or vascular malformation. No
missing distal branch vessels are identified.

Posterior circulation: Both vertebral arteries are widely patent to
the basilar. No basilar stenosis. Fenestrated basilar artery
incidentally noted. Posterior circulation branch vessels appear
normal.

Venous sinuses: Patent and normal.

Anatomic variants: None significant.

Delayed phase: Abnormal enhancement.

Review of the MIP images confirms the above findings
IMPRESSION: No intracranial large or medium vessel occlusion identified.

Atherosclerotic tortuosity of the aorta. Anomalous origin of the
right subclavian artery as the last vessel from the arch.

Minimal atherosclerotic change at the left carotid bifurcation but
no stenosis.

These results were called by telephone at the time of interpretation
on 06/16/2017 at [DATE] to Dr. JULLON LIENAD , who verbally
acknowledged these results.

## 2019-09-14 IMAGING — DX DG CHEST 1V PORT
1 series · 1 of 1 positions shown · non-contrast
Comparison: 09/01/2017

CLINICAL DATA: Ventilator support.

EXAM:
PORTABLE CHEST 1 VIEW

[chest]
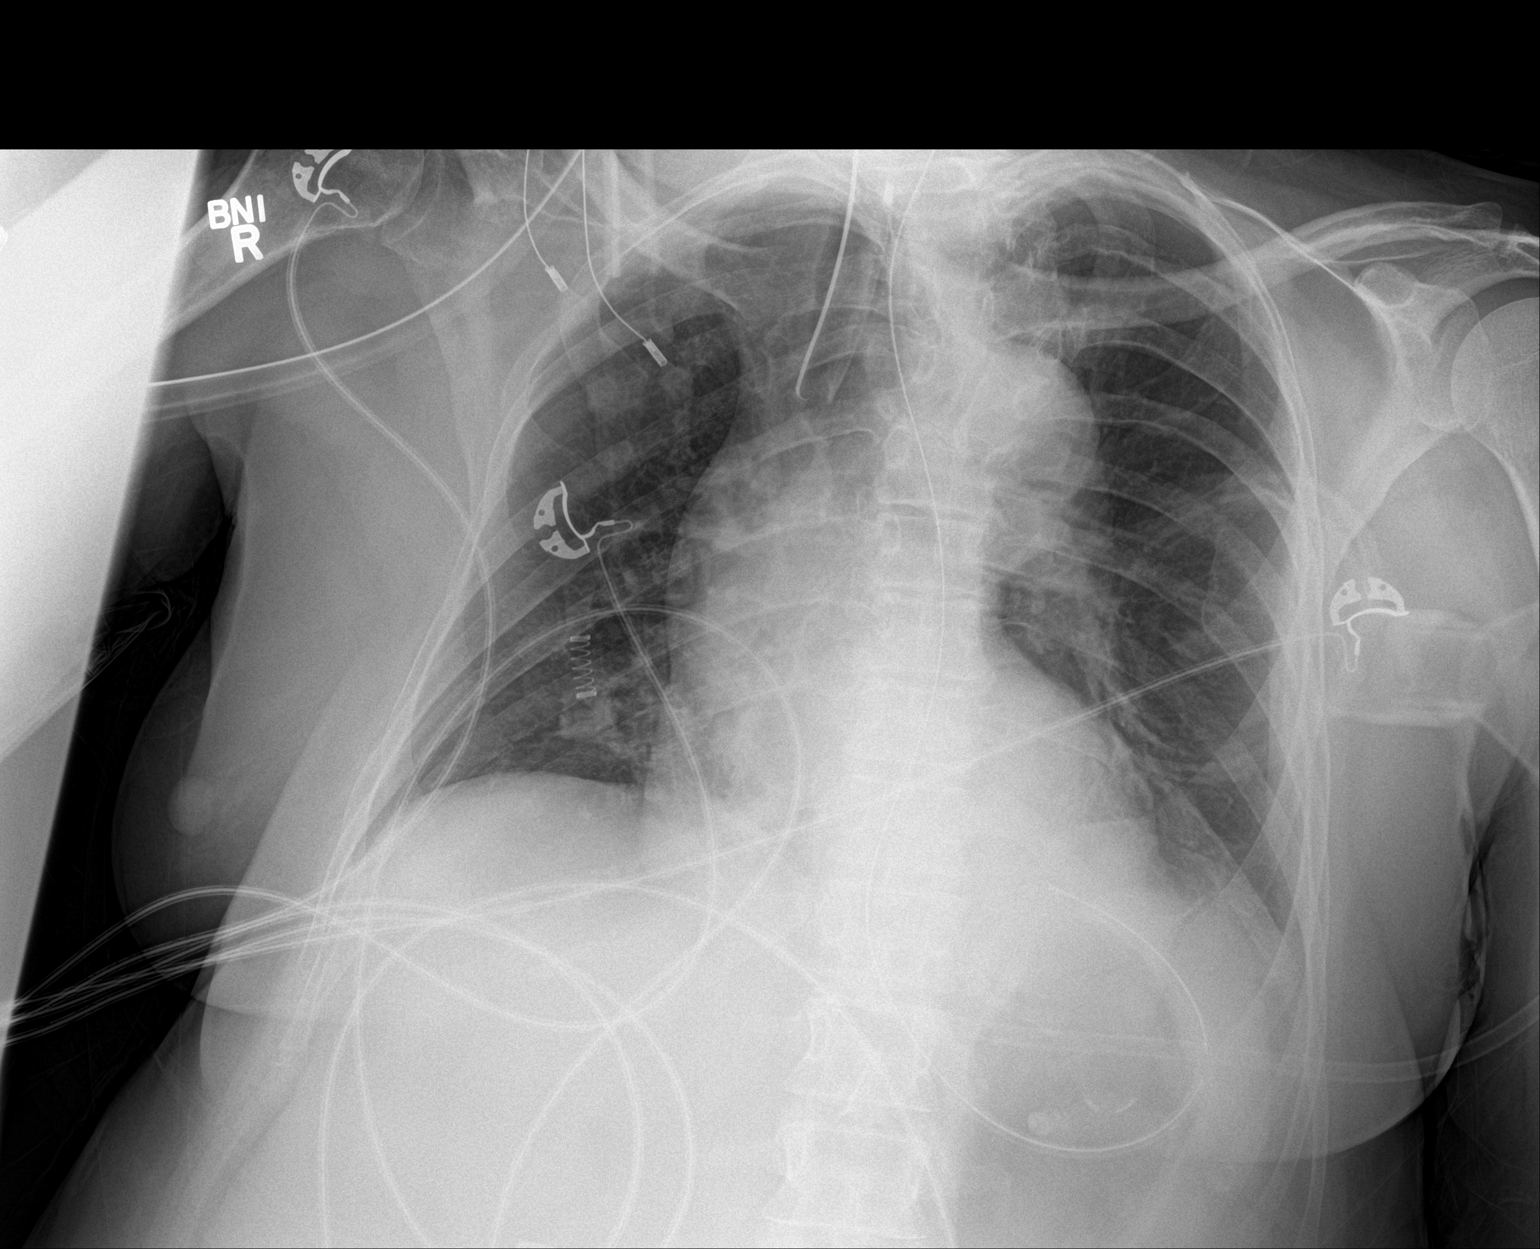

[1 of 1 positions shown; findings below may reference images not displayed]

FINDINGS: Endotracheal tube tip is 1 cm above the carina. Orogastric or
nasogastric tube enters the stomach. The lungs are clear except for
mild atelectasis in the left lower lobe. No edema or visible
effusion. Atherosclerosis and tortuosity of the aorta.
IMPRESSION: Endotracheal tube and orogastric tube appear well positioned. Mild
atelectasis in the left lower lobe.
